# Patient Record
Sex: Male | Born: 1940
Health system: Southern US, Community
[De-identification: ages and names within clinical notes are randomized; demographics above are authoritative.]

## PROBLEM LIST (undated history)

## (undated) DIAGNOSIS — R55 Syncope and collapse: Secondary | ICD-10-CM

## (undated) DIAGNOSIS — I4891 Unspecified atrial fibrillation: Secondary | ICD-10-CM

## (undated) DIAGNOSIS — S22000A Wedge compression fracture of unspecified thoracic vertebra, initial encounter for closed fracture: Secondary | ICD-10-CM

## (undated) DIAGNOSIS — I2510 Atherosclerotic heart disease of native coronary artery without angina pectoris: Secondary | ICD-10-CM

## (undated) DIAGNOSIS — F1010 Alcohol abuse, uncomplicated: Secondary | ICD-10-CM

## (undated) DIAGNOSIS — M12812 Other specific arthropathies, not elsewhere classified, left shoulder: Secondary | ICD-10-CM

## (undated) DIAGNOSIS — D649 Anemia, unspecified: Secondary | ICD-10-CM

## (undated) DIAGNOSIS — E785 Hyperlipidemia, unspecified: Secondary | ICD-10-CM

## (undated) DIAGNOSIS — J189 Pneumonia, unspecified organism: Secondary | ICD-10-CM

## (undated) DIAGNOSIS — Z8719 Personal history of other diseases of the digestive system: Secondary | ICD-10-CM

## (undated) DIAGNOSIS — C44621 Squamous cell carcinoma of skin of unspecified upper limb, including shoulder: Secondary | ICD-10-CM

## (undated) DIAGNOSIS — N200 Calculus of kidney: Secondary | ICD-10-CM

## (undated) DIAGNOSIS — I10 Essential (primary) hypertension: Secondary | ICD-10-CM

## (undated) DIAGNOSIS — K449 Diaphragmatic hernia without obstruction or gangrene: Secondary | ICD-10-CM

## (undated) DIAGNOSIS — R7989 Other specified abnormal findings of blood chemistry: Secondary | ICD-10-CM

## (undated) DIAGNOSIS — D509 Iron deficiency anemia, unspecified: Secondary | ICD-10-CM

## (undated) DIAGNOSIS — K5790 Diverticulosis of intestine, part unspecified, without perforation or abscess without bleeding: Secondary | ICD-10-CM

## (undated) DIAGNOSIS — I700 Atherosclerosis of aorta: Secondary | ICD-10-CM

## (undated) DIAGNOSIS — K922 Gastrointestinal hemorrhage, unspecified: Secondary | ICD-10-CM

## (undated) DIAGNOSIS — I517 Cardiomegaly: Secondary | ICD-10-CM

## (undated) DIAGNOSIS — R011 Cardiac murmur, unspecified: Secondary | ICD-10-CM

## (undated) DIAGNOSIS — Z87442 Personal history of urinary calculi: Secondary | ICD-10-CM

## (undated) DIAGNOSIS — M5030 Other cervical disc degeneration, unspecified cervical region: Secondary | ICD-10-CM

## (undated) DIAGNOSIS — N401 Enlarged prostate with lower urinary tract symptoms: Secondary | ICD-10-CM

## (undated) DIAGNOSIS — N138 Other obstructive and reflux uropathy: Secondary | ICD-10-CM

## (undated) DIAGNOSIS — K529 Noninfective gastroenteritis and colitis, unspecified: Secondary | ICD-10-CM

## (undated) HISTORY — DX: Personal history of urinary calculi: Z87.442

## (undated) HISTORY — PX: WISDOM TOOTH EXTRACTION: SHX21

## (undated) HISTORY — PX: FOOT SURGERY: SHX648

## (undated) HISTORY — PX: TONSILLECTOMY: SUR1361

## (undated) HISTORY — PX: CHOLECYSTECTOMY: SHX55

## (undated) HISTORY — PX: HEMORROIDECTOMY: SUR656

## (undated) HISTORY — PX: ENDOSCOPIC RETROGRADE CHOLANGIOPANCREATOGRAPHY (ERCP) WITH PROPOFOL: SHX5810

## (undated) HISTORY — PX: HERNIA REPAIR: SHX51

## (undated) HISTORY — DX: Unspecified atrial fibrillation: I48.91

## (undated) HISTORY — DX: Personal history of other diseases of the digestive system: Z87.19

## (undated) HISTORY — PX: CARDIAC CATHETERIZATION: SHX172

## (undated) HISTORY — PX: ENDOSCOPIC VEIN LASER TREATMENT: SHX1508

## (undated) HISTORY — PX: RHINOPLASTY: SUR1284

## (undated) HISTORY — PX: OTHER SURGICAL HISTORY: SHX169

## (undated) MED FILL — Ferumoxytol Inj 510 MG/17ML (30 MG/ML) (Elemental Fe): INTRAVENOUS | Qty: 17 | Status: AC

---

## 1966-03-13 HISTORY — PX: OTHER SURGICAL HISTORY: SHX169

## 2004-02-25 ENCOUNTER — Ambulatory Visit: Payer: Self-pay | Admitting: Internal Medicine

## 2004-03-30 ENCOUNTER — Ambulatory Visit: Payer: Self-pay | Admitting: Surgery

## 2006-03-13 HISTORY — PX: CATARACT EXTRACTION: SUR2

## 2006-05-08 ENCOUNTER — Ambulatory Visit: Payer: Self-pay | Admitting: Unknown Physician Specialty

## 2006-10-31 ENCOUNTER — Ambulatory Visit: Payer: Self-pay | Admitting: Ophthalmology

## 2006-11-07 ENCOUNTER — Ambulatory Visit: Payer: Self-pay | Admitting: Ophthalmology

## 2007-03-14 HISTORY — PX: FOOT SURGERY: SHX648

## 2007-04-15 ENCOUNTER — Other Ambulatory Visit: Payer: Self-pay

## 2007-04-15 ENCOUNTER — Emergency Department: Payer: Self-pay | Admitting: Internal Medicine

## 2007-04-29 ENCOUNTER — Encounter: Payer: Self-pay | Admitting: Internal Medicine

## 2007-05-12 ENCOUNTER — Encounter: Payer: Self-pay | Admitting: Internal Medicine

## 2007-06-12 ENCOUNTER — Encounter: Payer: Self-pay | Admitting: Internal Medicine

## 2007-11-04 ENCOUNTER — Ambulatory Visit: Payer: Self-pay | Admitting: Podiatry

## 2007-11-08 ENCOUNTER — Ambulatory Visit: Payer: Self-pay | Admitting: Podiatry

## 2009-03-13 HISTORY — PX: NOSE SURGERY: SHX723

## 2010-02-24 ENCOUNTER — Ambulatory Visit: Payer: Self-pay | Admitting: Otolaryngology

## 2011-03-14 HISTORY — PX: OTHER SURGICAL HISTORY: SHX169

## 2011-03-21 ENCOUNTER — Ambulatory Visit: Payer: Self-pay | Admitting: Internal Medicine

## 2011-04-03 ENCOUNTER — Ambulatory Visit: Payer: Self-pay | Admitting: Ophthalmology

## 2011-04-04 ENCOUNTER — Ambulatory Visit: Payer: Self-pay | Admitting: Physical Medicine and Rehabilitation

## 2011-04-11 ENCOUNTER — Ambulatory Visit: Payer: Self-pay | Admitting: Ophthalmology

## 2012-03-13 HISTORY — PX: OTHER SURGICAL HISTORY: SHX169

## 2013-01-02 ENCOUNTER — Ambulatory Visit: Payer: Self-pay | Admitting: Gastroenterology

## 2013-01-13 ENCOUNTER — Ambulatory Visit: Payer: Self-pay | Admitting: Gastroenterology

## 2013-01-23 DIAGNOSIS — Z9889 Other specified postprocedural states: Secondary | ICD-10-CM | POA: Insufficient documentation

## 2013-01-23 DIAGNOSIS — K8050 Calculus of bile duct without cholangitis or cholecystitis without obstruction: Secondary | ICD-10-CM | POA: Insufficient documentation

## 2013-01-24 DIAGNOSIS — K851 Biliary acute pancreatitis without necrosis or infection: Secondary | ICD-10-CM | POA: Insufficient documentation

## 2013-10-06 DIAGNOSIS — D649 Anemia, unspecified: Secondary | ICD-10-CM | POA: Insufficient documentation

## 2013-10-06 DIAGNOSIS — D509 Iron deficiency anemia, unspecified: Secondary | ICD-10-CM | POA: Insufficient documentation

## 2014-07-05 NOTE — Op Note (Signed)
PATIENT NAME:  Marcus Coleman, Marcus Coleman MR#:  [id number] DATE OF BIRTH:  1941-03-11  DATE OF PROCEDURE:  04/11/2011  PREOPERATIVE DIAGNOSES:  Senile cataract right eye, astigmatism.  POSTOPERATIVE DIAGNOSES:  Senile cataract right eye, astigmatism.  PROCEDURES:  1. Phacoemulsification with posterior chamber intraocular lens implantation of the right eye. 2. Limbal relaxing incision, right eye.  LENS:  SN6AD1 21.0-diopter posterior chamber with Restore intraocular lens with 3 diopters of add power.  ULTRASOUND TIME:  9% of 1 minute, 19 seconds for CDE 7.3.  SURGEON:  Mali Chaselynn Kepple, MD  ANESTHESIA:  Topical with tetracaine drops and 2% Xylocaine jelly.  COMPLICATIONS:  None.  DESCRIPTION OF PROCEDURE:  The patient was identified in the holding room and transported to the operating room and placed in the supine position under the operating microscope.  The right eye was identified as the operative eye and it was prepped and draped in the usual sterile ophthalmic fashion. The axis of the eye had been marked preoperatively at 0, 180, and 270 degrees. A guarded diamond blade set to 0.6 mm was used to make a 60 degree arcuate incision on the nasal aspect of the peripheral cornea from the 2 o'clock  to the 4 o'clock  position.  A 1 millimeter clear-corneal paracentesis was made at the 12 o'clock  position.  The anterior chamber was filled with Viscoat viscoelastic.  A 2.4 millimeter keratome was used to make a near-clear corneal incision at the 9 o'clock position.  A curvilinear capsulorrhexis was made with a cystotome and capsulorrhexis forceps.  Balanced salt solution was used to hydrodissect and hydrodelineate the nucleus.  Phacoemulsification was then used in horizontal chopping fashion to remove the lens nucleus and epinucleus.  The remaining cortex was then removed using the irrigation and aspiration handpiece. Provisc was then placed into the capsular bag to distend it for lens placement.  A  SN6AD1  21.0-diopter lens was then injected into the capsular bag.  The remaining viscoelastic was aspirated.  Wounds were hydrated with balanced salt solution.  The anterior chamber was inflated to a physiologic pressure with balanced salt solution.  Miostat was placed into the anterior chamber to constrict the pupil. No wound leaks were noted.  Topical Vigamox drops and Maxitrol ointment were applied to the eye. The patient was taken to the recovery room in stable condition without complications of anesthesia or surgery. ____________________________ Wyonia Hough, MD crb:slb D: 04/11/2011 10:20:26 ET T: 04/11/2011 11:20:20 ET JOB#: [id number]  cc: Wyonia Hough, MD, <Dictator> Leandrew Koyanagi MD ELECTRONICALLY SIGNED 04/12/2011 11:13

## 2014-08-06 ENCOUNTER — Other Ambulatory Visit: Payer: Self-pay | Admitting: Family Medicine

## 2014-08-06 ENCOUNTER — Ambulatory Visit
Admission: RE | Admit: 2014-08-06 | Discharge: 2014-08-06 | Disposition: A | Discharge status: Home / Self Care | Payer: PPO | Source: Ambulatory Visit | Attending: Family Medicine | Admitting: Family Medicine

## 2014-08-06 DIAGNOSIS — M79604 Pain in right leg: Secondary | ICD-10-CM | POA: Diagnosis present

## 2014-11-13 DIAGNOSIS — M5135 Other intervertebral disc degeneration, thoracolumbar region: Secondary | ICD-10-CM | POA: Insufficient documentation

## 2014-11-13 DIAGNOSIS — M5416 Radiculopathy, lumbar region: Secondary | ICD-10-CM | POA: Insufficient documentation

## 2015-03-30 DIAGNOSIS — M5432 Sciatica, left side: Secondary | ICD-10-CM | POA: Diagnosis not present

## 2015-03-30 DIAGNOSIS — M955 Acquired deformity of pelvis: Secondary | ICD-10-CM | POA: Diagnosis not present

## 2015-03-30 DIAGNOSIS — M9903 Segmental and somatic dysfunction of lumbar region: Secondary | ICD-10-CM | POA: Diagnosis not present

## 2015-03-30 DIAGNOSIS — M9905 Segmental and somatic dysfunction of pelvic region: Secondary | ICD-10-CM | POA: Diagnosis not present

## 2015-04-09 DIAGNOSIS — L03119 Cellulitis of unspecified part of limb: Secondary | ICD-10-CM | POA: Diagnosis not present

## 2015-04-09 DIAGNOSIS — M7989 Other specified soft tissue disorders: Secondary | ICD-10-CM | POA: Diagnosis not present

## 2015-04-09 DIAGNOSIS — M79609 Pain in unspecified limb: Secondary | ICD-10-CM | POA: Diagnosis not present

## 2015-04-09 DIAGNOSIS — I890 Lymphedema, not elsewhere classified: Secondary | ICD-10-CM | POA: Diagnosis not present

## 2015-04-09 DIAGNOSIS — I872 Venous insufficiency (chronic) (peripheral): Secondary | ICD-10-CM | POA: Diagnosis not present

## 2015-04-09 DIAGNOSIS — I8000 Phlebitis and thrombophlebitis of superficial vessels of unspecified lower extremity: Secondary | ICD-10-CM | POA: Diagnosis not present

## 2015-04-09 DIAGNOSIS — I83813 Varicose veins of bilateral lower extremities with pain: Secondary | ICD-10-CM | POA: Diagnosis not present

## 2015-04-27 DIAGNOSIS — M9903 Segmental and somatic dysfunction of lumbar region: Secondary | ICD-10-CM | POA: Diagnosis not present

## 2015-04-27 DIAGNOSIS — M9905 Segmental and somatic dysfunction of pelvic region: Secondary | ICD-10-CM | POA: Diagnosis not present

## 2015-04-27 DIAGNOSIS — M5432 Sciatica, left side: Secondary | ICD-10-CM | POA: Diagnosis not present

## 2015-04-27 DIAGNOSIS — M955 Acquired deformity of pelvis: Secondary | ICD-10-CM | POA: Diagnosis not present

## 2015-04-30 DIAGNOSIS — Z125 Encounter for screening for malignant neoplasm of prostate: Secondary | ICD-10-CM | POA: Diagnosis not present

## 2015-04-30 DIAGNOSIS — E7800 Pure hypercholesterolemia, unspecified: Secondary | ICD-10-CM | POA: Diagnosis not present

## 2015-04-30 DIAGNOSIS — Z79899 Other long term (current) drug therapy: Secondary | ICD-10-CM | POA: Diagnosis not present

## 2015-05-04 DIAGNOSIS — Z961 Presence of intraocular lens: Secondary | ICD-10-CM | POA: Diagnosis not present

## 2015-05-07 DIAGNOSIS — R030 Elevated blood-pressure reading, without diagnosis of hypertension: Secondary | ICD-10-CM | POA: Diagnosis not present

## 2015-05-07 DIAGNOSIS — E7800 Pure hypercholesterolemia, unspecified: Secondary | ICD-10-CM | POA: Diagnosis not present

## 2015-05-07 DIAGNOSIS — Z79899 Other long term (current) drug therapy: Secondary | ICD-10-CM | POA: Diagnosis not present

## 2015-05-07 DIAGNOSIS — D509 Iron deficiency anemia, unspecified: Secondary | ICD-10-CM | POA: Diagnosis not present

## 2015-05-07 DIAGNOSIS — Z0000 Encounter for general adult medical examination without abnormal findings: Secondary | ICD-10-CM | POA: Diagnosis not present

## 2015-05-07 DIAGNOSIS — M5136 Other intervertebral disc degeneration, lumbar region: Secondary | ICD-10-CM | POA: Diagnosis not present

## 2015-05-18 DIAGNOSIS — I890 Lymphedema, not elsewhere classified: Secondary | ICD-10-CM | POA: Diagnosis not present

## 2015-05-18 DIAGNOSIS — M7989 Other specified soft tissue disorders: Secondary | ICD-10-CM | POA: Diagnosis not present

## 2015-05-18 DIAGNOSIS — L03119 Cellulitis of unspecified part of limb: Secondary | ICD-10-CM | POA: Diagnosis not present

## 2015-05-18 DIAGNOSIS — I872 Venous insufficiency (chronic) (peripheral): Secondary | ICD-10-CM | POA: Diagnosis not present

## 2015-05-18 DIAGNOSIS — M79609 Pain in unspecified limb: Secondary | ICD-10-CM | POA: Diagnosis not present

## 2015-05-18 DIAGNOSIS — I83813 Varicose veins of bilateral lower extremities with pain: Secondary | ICD-10-CM | POA: Diagnosis not present

## 2015-05-18 DIAGNOSIS — I8000 Phlebitis and thrombophlebitis of superficial vessels of unspecified lower extremity: Secondary | ICD-10-CM | POA: Diagnosis not present

## 2015-05-21 DIAGNOSIS — L03119 Cellulitis of unspecified part of limb: Secondary | ICD-10-CM | POA: Diagnosis not present

## 2015-05-21 DIAGNOSIS — I872 Venous insufficiency (chronic) (peripheral): Secondary | ICD-10-CM | POA: Diagnosis not present

## 2015-05-21 DIAGNOSIS — I8000 Phlebitis and thrombophlebitis of superficial vessels of unspecified lower extremity: Secondary | ICD-10-CM | POA: Diagnosis not present

## 2015-05-21 DIAGNOSIS — M79609 Pain in unspecified limb: Secondary | ICD-10-CM | POA: Diagnosis not present

## 2015-05-21 DIAGNOSIS — I8311 Varicose veins of right lower extremity with inflammation: Secondary | ICD-10-CM | POA: Diagnosis not present

## 2015-05-21 DIAGNOSIS — I83813 Varicose veins of bilateral lower extremities with pain: Secondary | ICD-10-CM | POA: Diagnosis not present

## 2015-05-21 DIAGNOSIS — M7989 Other specified soft tissue disorders: Secondary | ICD-10-CM | POA: Diagnosis not present

## 2015-05-21 DIAGNOSIS — I890 Lymphedema, not elsewhere classified: Secondary | ICD-10-CM | POA: Diagnosis not present

## 2015-05-25 DIAGNOSIS — M9905 Segmental and somatic dysfunction of pelvic region: Secondary | ICD-10-CM | POA: Diagnosis not present

## 2015-05-25 DIAGNOSIS — M955 Acquired deformity of pelvis: Secondary | ICD-10-CM | POA: Diagnosis not present

## 2015-05-25 DIAGNOSIS — M9903 Segmental and somatic dysfunction of lumbar region: Secondary | ICD-10-CM | POA: Diagnosis not present

## 2015-05-25 DIAGNOSIS — M5432 Sciatica, left side: Secondary | ICD-10-CM | POA: Diagnosis not present

## 2015-06-11 DIAGNOSIS — M79609 Pain in unspecified limb: Secondary | ICD-10-CM | POA: Diagnosis not present

## 2015-06-11 DIAGNOSIS — I8311 Varicose veins of right lower extremity with inflammation: Secondary | ICD-10-CM | POA: Diagnosis not present

## 2015-06-11 DIAGNOSIS — I83813 Varicose veins of bilateral lower extremities with pain: Secondary | ICD-10-CM | POA: Diagnosis not present

## 2015-06-11 DIAGNOSIS — L03119 Cellulitis of unspecified part of limb: Secondary | ICD-10-CM | POA: Diagnosis not present

## 2015-06-11 DIAGNOSIS — I8000 Phlebitis and thrombophlebitis of superficial vessels of unspecified lower extremity: Secondary | ICD-10-CM | POA: Diagnosis not present

## 2015-06-11 DIAGNOSIS — I890 Lymphedema, not elsewhere classified: Secondary | ICD-10-CM | POA: Diagnosis not present

## 2015-06-11 DIAGNOSIS — M7989 Other specified soft tissue disorders: Secondary | ICD-10-CM | POA: Diagnosis not present

## 2015-06-11 DIAGNOSIS — I872 Venous insufficiency (chronic) (peripheral): Secondary | ICD-10-CM | POA: Diagnosis not present

## 2015-07-09 DIAGNOSIS — H900 Conductive hearing loss, bilateral: Secondary | ICD-10-CM | POA: Diagnosis not present

## 2015-07-23 DIAGNOSIS — J069 Acute upper respiratory infection, unspecified: Secondary | ICD-10-CM | POA: Diagnosis not present

## 2015-07-26 DIAGNOSIS — M5432 Sciatica, left side: Secondary | ICD-10-CM | POA: Diagnosis not present

## 2015-07-26 DIAGNOSIS — M9903 Segmental and somatic dysfunction of lumbar region: Secondary | ICD-10-CM | POA: Diagnosis not present

## 2015-07-26 DIAGNOSIS — M9905 Segmental and somatic dysfunction of pelvic region: Secondary | ICD-10-CM | POA: Diagnosis not present

## 2015-07-26 DIAGNOSIS — M955 Acquired deformity of pelvis: Secondary | ICD-10-CM | POA: Diagnosis not present

## 2015-08-04 DIAGNOSIS — D2262 Melanocytic nevi of left upper limb, including shoulder: Secondary | ICD-10-CM | POA: Diagnosis not present

## 2015-08-04 DIAGNOSIS — X32XXXA Exposure to sunlight, initial encounter: Secondary | ICD-10-CM | POA: Diagnosis not present

## 2015-08-04 DIAGNOSIS — L0889 Other specified local infections of the skin and subcutaneous tissue: Secondary | ICD-10-CM | POA: Diagnosis not present

## 2015-08-04 DIAGNOSIS — B078 Other viral warts: Secondary | ICD-10-CM | POA: Diagnosis not present

## 2015-08-04 DIAGNOSIS — L570 Actinic keratosis: Secondary | ICD-10-CM | POA: Diagnosis not present

## 2015-08-04 DIAGNOSIS — Z85828 Personal history of other malignant neoplasm of skin: Secondary | ICD-10-CM | POA: Diagnosis not present

## 2015-08-04 DIAGNOSIS — D225 Melanocytic nevi of trunk: Secondary | ICD-10-CM | POA: Diagnosis not present

## 2015-08-04 DIAGNOSIS — D2272 Melanocytic nevi of left lower limb, including hip: Secondary | ICD-10-CM | POA: Diagnosis not present

## 2015-08-23 DIAGNOSIS — M9903 Segmental and somatic dysfunction of lumbar region: Secondary | ICD-10-CM | POA: Diagnosis not present

## 2015-08-23 DIAGNOSIS — M5432 Sciatica, left side: Secondary | ICD-10-CM | POA: Diagnosis not present

## 2015-08-23 DIAGNOSIS — M955 Acquired deformity of pelvis: Secondary | ICD-10-CM | POA: Diagnosis not present

## 2015-08-23 DIAGNOSIS — M9905 Segmental and somatic dysfunction of pelvic region: Secondary | ICD-10-CM | POA: Diagnosis not present

## 2015-09-20 DIAGNOSIS — M5432 Sciatica, left side: Secondary | ICD-10-CM | POA: Diagnosis not present

## 2015-09-20 DIAGNOSIS — M955 Acquired deformity of pelvis: Secondary | ICD-10-CM | POA: Diagnosis not present

## 2015-09-20 DIAGNOSIS — M9903 Segmental and somatic dysfunction of lumbar region: Secondary | ICD-10-CM | POA: Diagnosis not present

## 2015-09-20 DIAGNOSIS — M9905 Segmental and somatic dysfunction of pelvic region: Secondary | ICD-10-CM | POA: Diagnosis not present

## 2015-09-24 DIAGNOSIS — I83813 Varicose veins of bilateral lower extremities with pain: Secondary | ICD-10-CM | POA: Diagnosis not present

## 2015-09-24 DIAGNOSIS — I872 Venous insufficiency (chronic) (peripheral): Secondary | ICD-10-CM | POA: Diagnosis not present

## 2015-09-24 DIAGNOSIS — I890 Lymphedema, not elsewhere classified: Secondary | ICD-10-CM | POA: Diagnosis not present

## 2015-09-24 DIAGNOSIS — M79609 Pain in unspecified limb: Secondary | ICD-10-CM | POA: Diagnosis not present

## 2015-09-24 DIAGNOSIS — I8000 Phlebitis and thrombophlebitis of superficial vessels of unspecified lower extremity: Secondary | ICD-10-CM | POA: Diagnosis not present

## 2015-09-24 DIAGNOSIS — M7989 Other specified soft tissue disorders: Secondary | ICD-10-CM | POA: Diagnosis not present

## 2015-09-24 DIAGNOSIS — I8311 Varicose veins of right lower extremity with inflammation: Secondary | ICD-10-CM | POA: Diagnosis not present

## 2015-09-24 DIAGNOSIS — L03119 Cellulitis of unspecified part of limb: Secondary | ICD-10-CM | POA: Diagnosis not present

## 2015-09-28 DIAGNOSIS — I872 Venous insufficiency (chronic) (peripheral): Secondary | ICD-10-CM | POA: Diagnosis not present

## 2015-09-28 DIAGNOSIS — M79609 Pain in unspecified limb: Secondary | ICD-10-CM | POA: Diagnosis not present

## 2015-09-28 DIAGNOSIS — I83813 Varicose veins of bilateral lower extremities with pain: Secondary | ICD-10-CM | POA: Diagnosis not present

## 2015-09-28 DIAGNOSIS — L03119 Cellulitis of unspecified part of limb: Secondary | ICD-10-CM | POA: Diagnosis not present

## 2015-09-28 DIAGNOSIS — M7989 Other specified soft tissue disorders: Secondary | ICD-10-CM | POA: Diagnosis not present

## 2015-09-28 DIAGNOSIS — I8310 Varicose veins of unspecified lower extremity with inflammation: Secondary | ICD-10-CM | POA: Diagnosis not present

## 2015-09-28 DIAGNOSIS — I8000 Phlebitis and thrombophlebitis of superficial vessels of unspecified lower extremity: Secondary | ICD-10-CM | POA: Diagnosis not present

## 2015-09-28 DIAGNOSIS — I8311 Varicose veins of right lower extremity with inflammation: Secondary | ICD-10-CM | POA: Diagnosis not present

## 2015-09-28 DIAGNOSIS — I890 Lymphedema, not elsewhere classified: Secondary | ICD-10-CM | POA: Diagnosis not present

## 2015-10-18 DIAGNOSIS — M9903 Segmental and somatic dysfunction of lumbar region: Secondary | ICD-10-CM | POA: Diagnosis not present

## 2015-10-18 DIAGNOSIS — M5432 Sciatica, left side: Secondary | ICD-10-CM | POA: Diagnosis not present

## 2015-10-18 DIAGNOSIS — M9905 Segmental and somatic dysfunction of pelvic region: Secondary | ICD-10-CM | POA: Diagnosis not present

## 2015-10-18 DIAGNOSIS — M955 Acquired deformity of pelvis: Secondary | ICD-10-CM | POA: Diagnosis not present

## 2015-10-22 DIAGNOSIS — I8310 Varicose veins of unspecified lower extremity with inflammation: Secondary | ICD-10-CM | POA: Diagnosis not present

## 2015-10-22 DIAGNOSIS — I890 Lymphedema, not elsewhere classified: Secondary | ICD-10-CM | POA: Diagnosis not present

## 2015-10-22 DIAGNOSIS — M79609 Pain in unspecified limb: Secondary | ICD-10-CM | POA: Diagnosis not present

## 2015-10-22 DIAGNOSIS — M7989 Other specified soft tissue disorders: Secondary | ICD-10-CM | POA: Diagnosis not present

## 2015-10-22 DIAGNOSIS — I8000 Phlebitis and thrombophlebitis of superficial vessels of unspecified lower extremity: Secondary | ICD-10-CM | POA: Diagnosis not present

## 2015-10-22 DIAGNOSIS — I872 Venous insufficiency (chronic) (peripheral): Secondary | ICD-10-CM | POA: Diagnosis not present

## 2015-10-22 DIAGNOSIS — Z79899 Other long term (current) drug therapy: Secondary | ICD-10-CM | POA: Diagnosis not present

## 2015-10-22 DIAGNOSIS — D509 Iron deficiency anemia, unspecified: Secondary | ICD-10-CM | POA: Diagnosis not present

## 2015-10-22 DIAGNOSIS — I8311 Varicose veins of right lower extremity with inflammation: Secondary | ICD-10-CM | POA: Diagnosis not present

## 2015-10-22 DIAGNOSIS — L03119 Cellulitis of unspecified part of limb: Secondary | ICD-10-CM | POA: Diagnosis not present

## 2015-10-22 DIAGNOSIS — I83813 Varicose veins of bilateral lower extremities with pain: Secondary | ICD-10-CM | POA: Diagnosis not present

## 2015-11-01 DIAGNOSIS — M9905 Segmental and somatic dysfunction of pelvic region: Secondary | ICD-10-CM | POA: Diagnosis not present

## 2015-11-01 DIAGNOSIS — M5432 Sciatica, left side: Secondary | ICD-10-CM | POA: Diagnosis not present

## 2015-11-01 DIAGNOSIS — M9903 Segmental and somatic dysfunction of lumbar region: Secondary | ICD-10-CM | POA: Diagnosis not present

## 2015-11-01 DIAGNOSIS — M955 Acquired deformity of pelvis: Secondary | ICD-10-CM | POA: Diagnosis not present

## 2015-11-05 DIAGNOSIS — E7800 Pure hypercholesterolemia, unspecified: Secondary | ICD-10-CM | POA: Diagnosis not present

## 2015-11-05 DIAGNOSIS — Z79899 Other long term (current) drug therapy: Secondary | ICD-10-CM | POA: Diagnosis not present

## 2015-11-05 DIAGNOSIS — D509 Iron deficiency anemia, unspecified: Secondary | ICD-10-CM | POA: Diagnosis not present

## 2015-11-05 DIAGNOSIS — M5416 Radiculopathy, lumbar region: Secondary | ICD-10-CM | POA: Diagnosis not present

## 2015-11-05 DIAGNOSIS — Z125 Encounter for screening for malignant neoplasm of prostate: Secondary | ICD-10-CM | POA: Diagnosis not present

## 2015-11-05 DIAGNOSIS — M25511 Pain in right shoulder: Secondary | ICD-10-CM | POA: Diagnosis not present

## 2015-11-05 DIAGNOSIS — Z13220 Encounter for screening for lipoid disorders: Secondary | ICD-10-CM | POA: Diagnosis not present

## 2015-11-05 DIAGNOSIS — M19011 Primary osteoarthritis, right shoulder: Secondary | ICD-10-CM | POA: Diagnosis not present

## 2015-11-16 DIAGNOSIS — M9903 Segmental and somatic dysfunction of lumbar region: Secondary | ICD-10-CM | POA: Diagnosis not present

## 2015-11-16 DIAGNOSIS — M955 Acquired deformity of pelvis: Secondary | ICD-10-CM | POA: Diagnosis not present

## 2015-11-16 DIAGNOSIS — M9905 Segmental and somatic dysfunction of pelvic region: Secondary | ICD-10-CM | POA: Diagnosis not present

## 2015-11-16 DIAGNOSIS — M5432 Sciatica, left side: Secondary | ICD-10-CM | POA: Diagnosis not present

## 2015-12-07 DIAGNOSIS — M9905 Segmental and somatic dysfunction of pelvic region: Secondary | ICD-10-CM | POA: Diagnosis not present

## 2015-12-07 DIAGNOSIS — M9903 Segmental and somatic dysfunction of lumbar region: Secondary | ICD-10-CM | POA: Diagnosis not present

## 2015-12-07 DIAGNOSIS — M5432 Sciatica, left side: Secondary | ICD-10-CM | POA: Diagnosis not present

## 2015-12-07 DIAGNOSIS — M955 Acquired deformity of pelvis: Secondary | ICD-10-CM | POA: Diagnosis not present

## 2015-12-28 DIAGNOSIS — M5432 Sciatica, left side: Secondary | ICD-10-CM | POA: Diagnosis not present

## 2015-12-28 DIAGNOSIS — M9905 Segmental and somatic dysfunction of pelvic region: Secondary | ICD-10-CM | POA: Diagnosis not present

## 2015-12-28 DIAGNOSIS — M9903 Segmental and somatic dysfunction of lumbar region: Secondary | ICD-10-CM | POA: Diagnosis not present

## 2015-12-28 DIAGNOSIS — M955 Acquired deformity of pelvis: Secondary | ICD-10-CM | POA: Diagnosis not present

## 2016-01-06 DIAGNOSIS — M545 Low back pain: Secondary | ICD-10-CM | POA: Diagnosis not present

## 2016-01-06 DIAGNOSIS — G8929 Other chronic pain: Secondary | ICD-10-CM | POA: Diagnosis not present

## 2016-01-25 DIAGNOSIS — M5432 Sciatica, left side: Secondary | ICD-10-CM | POA: Diagnosis not present

## 2016-01-25 DIAGNOSIS — M955 Acquired deformity of pelvis: Secondary | ICD-10-CM | POA: Diagnosis not present

## 2016-01-25 DIAGNOSIS — M9903 Segmental and somatic dysfunction of lumbar region: Secondary | ICD-10-CM | POA: Diagnosis not present

## 2016-01-25 DIAGNOSIS — M9905 Segmental and somatic dysfunction of pelvic region: Secondary | ICD-10-CM | POA: Diagnosis not present

## 2016-02-15 DIAGNOSIS — M5136 Other intervertebral disc degeneration, lumbar region: Secondary | ICD-10-CM | POA: Diagnosis not present

## 2016-02-15 DIAGNOSIS — M5416 Radiculopathy, lumbar region: Secondary | ICD-10-CM | POA: Diagnosis not present

## 2016-02-15 DIAGNOSIS — M5135 Other intervertebral disc degeneration, thoracolumbar region: Secondary | ICD-10-CM | POA: Diagnosis not present

## 2016-02-16 ENCOUNTER — Other Ambulatory Visit: Payer: Self-pay | Admitting: Physical Medicine and Rehabilitation

## 2016-02-16 DIAGNOSIS — M5416 Radiculopathy, lumbar region: Secondary | ICD-10-CM

## 2016-02-28 ENCOUNTER — Ambulatory Visit
Admission: RE | Admit: 2016-02-28 | Discharge: 2016-02-28 | Disposition: A | Discharge status: Home / Self Care | Payer: PPO | Source: Ambulatory Visit | Attending: Physical Medicine and Rehabilitation | Admitting: Physical Medicine and Rehabilitation

## 2016-02-28 DIAGNOSIS — M5136 Other intervertebral disc degeneration, lumbar region: Secondary | ICD-10-CM | POA: Insufficient documentation

## 2016-02-28 DIAGNOSIS — Z981 Arthrodesis status: Secondary | ICD-10-CM | POA: Insufficient documentation

## 2016-02-28 DIAGNOSIS — M5116 Intervertebral disc disorders with radiculopathy, lumbar region: Secondary | ICD-10-CM | POA: Diagnosis not present

## 2016-02-28 DIAGNOSIS — M545 Low back pain: Secondary | ICD-10-CM | POA: Diagnosis not present

## 2016-02-28 DIAGNOSIS — M5416 Radiculopathy, lumbar region: Secondary | ICD-10-CM | POA: Diagnosis not present

## 2016-02-29 DIAGNOSIS — M9905 Segmental and somatic dysfunction of pelvic region: Secondary | ICD-10-CM | POA: Diagnosis not present

## 2016-02-29 DIAGNOSIS — M9903 Segmental and somatic dysfunction of lumbar region: Secondary | ICD-10-CM | POA: Diagnosis not present

## 2016-02-29 DIAGNOSIS — M5432 Sciatica, left side: Secondary | ICD-10-CM | POA: Diagnosis not present

## 2016-02-29 DIAGNOSIS — M955 Acquired deformity of pelvis: Secondary | ICD-10-CM | POA: Diagnosis not present

## 2016-03-16 DIAGNOSIS — M5416 Radiculopathy, lumbar region: Secondary | ICD-10-CM | POA: Diagnosis not present

## 2016-03-16 DIAGNOSIS — M5136 Other intervertebral disc degeneration, lumbar region: Secondary | ICD-10-CM | POA: Diagnosis not present

## 2016-03-28 DIAGNOSIS — M955 Acquired deformity of pelvis: Secondary | ICD-10-CM | POA: Diagnosis not present

## 2016-03-28 DIAGNOSIS — M5432 Sciatica, left side: Secondary | ICD-10-CM | POA: Diagnosis not present

## 2016-03-28 DIAGNOSIS — M9905 Segmental and somatic dysfunction of pelvic region: Secondary | ICD-10-CM | POA: Diagnosis not present

## 2016-03-28 DIAGNOSIS — M9903 Segmental and somatic dysfunction of lumbar region: Secondary | ICD-10-CM | POA: Diagnosis not present

## 2016-04-06 DIAGNOSIS — M5136 Other intervertebral disc degeneration, lumbar region: Secondary | ICD-10-CM | POA: Diagnosis not present

## 2016-04-06 DIAGNOSIS — M5416 Radiculopathy, lumbar region: Secondary | ICD-10-CM | POA: Diagnosis not present

## 2016-04-07 DIAGNOSIS — Z1211 Encounter for screening for malignant neoplasm of colon: Secondary | ICD-10-CM | POA: Diagnosis not present

## 2016-04-07 DIAGNOSIS — D509 Iron deficiency anemia, unspecified: Secondary | ICD-10-CM | POA: Diagnosis not present

## 2016-04-21 DIAGNOSIS — M9905 Segmental and somatic dysfunction of pelvic region: Secondary | ICD-10-CM | POA: Diagnosis not present

## 2016-04-21 DIAGNOSIS — M9903 Segmental and somatic dysfunction of lumbar region: Secondary | ICD-10-CM | POA: Diagnosis not present

## 2016-04-21 DIAGNOSIS — M955 Acquired deformity of pelvis: Secondary | ICD-10-CM | POA: Diagnosis not present

## 2016-04-21 DIAGNOSIS — M5432 Sciatica, left side: Secondary | ICD-10-CM | POA: Diagnosis not present

## 2016-05-02 DIAGNOSIS — Z13220 Encounter for screening for lipoid disorders: Secondary | ICD-10-CM | POA: Diagnosis not present

## 2016-05-02 DIAGNOSIS — D509 Iron deficiency anemia, unspecified: Secondary | ICD-10-CM | POA: Diagnosis not present

## 2016-05-02 DIAGNOSIS — Z125 Encounter for screening for malignant neoplasm of prostate: Secondary | ICD-10-CM | POA: Diagnosis not present

## 2016-05-02 DIAGNOSIS — Z79899 Other long term (current) drug therapy: Secondary | ICD-10-CM | POA: Diagnosis not present

## 2016-05-09 DIAGNOSIS — Z0000 Encounter for general adult medical examination without abnormal findings: Secondary | ICD-10-CM | POA: Diagnosis not present

## 2016-05-09 DIAGNOSIS — E7800 Pure hypercholesterolemia, unspecified: Secondary | ICD-10-CM | POA: Diagnosis not present

## 2016-05-09 DIAGNOSIS — D509 Iron deficiency anemia, unspecified: Secondary | ICD-10-CM | POA: Diagnosis not present

## 2016-05-09 DIAGNOSIS — R05 Cough: Secondary | ICD-10-CM | POA: Diagnosis not present

## 2016-05-09 DIAGNOSIS — M5416 Radiculopathy, lumbar region: Secondary | ICD-10-CM | POA: Diagnosis not present

## 2016-05-09 DIAGNOSIS — Z79899 Other long term (current) drug therapy: Secondary | ICD-10-CM | POA: Diagnosis not present

## 2016-06-02 DIAGNOSIS — M5136 Other intervertebral disc degeneration, lumbar region: Secondary | ICD-10-CM | POA: Diagnosis not present

## 2016-06-02 DIAGNOSIS — M5135 Other intervertebral disc degeneration, thoracolumbar region: Secondary | ICD-10-CM | POA: Diagnosis not present

## 2016-06-02 DIAGNOSIS — D509 Iron deficiency anemia, unspecified: Secondary | ICD-10-CM | POA: Diagnosis not present

## 2016-06-02 DIAGNOSIS — M5416 Radiculopathy, lumbar region: Secondary | ICD-10-CM | POA: Diagnosis not present

## 2016-06-06 DIAGNOSIS — D509 Iron deficiency anemia, unspecified: Secondary | ICD-10-CM | POA: Diagnosis not present

## 2016-06-09 DIAGNOSIS — J3089 Other allergic rhinitis: Secondary | ICD-10-CM | POA: Diagnosis not present

## 2016-06-09 DIAGNOSIS — J209 Acute bronchitis, unspecified: Secondary | ICD-10-CM | POA: Diagnosis not present

## 2016-06-23 ENCOUNTER — Encounter: Payer: Self-pay | Admitting: *Deleted

## 2016-06-26 ENCOUNTER — Ambulatory Visit
Admission: RE | Admit: 2016-06-26 | Discharge: 2016-06-26 | Disposition: A | Discharge status: Home / Self Care | Payer: PPO | Source: Ambulatory Visit | Attending: Unknown Physician Specialty | Admitting: Unknown Physician Specialty

## 2016-06-26 ENCOUNTER — Ambulatory Visit: Payer: PPO | Admitting: Anesthesiology

## 2016-06-26 ENCOUNTER — Encounter: Payer: Self-pay | Admitting: *Deleted

## 2016-06-26 ENCOUNTER — Encounter
Admission: RE | Disposition: A | Discharge status: Home / Self Care | Payer: Self-pay | Source: Ambulatory Visit | Attending: Unknown Physician Specialty

## 2016-06-26 DIAGNOSIS — K640 First degree hemorrhoids: Secondary | ICD-10-CM | POA: Insufficient documentation

## 2016-06-26 DIAGNOSIS — D127 Benign neoplasm of rectosigmoid junction: Secondary | ICD-10-CM | POA: Diagnosis not present

## 2016-06-26 DIAGNOSIS — K635 Polyp of colon: Secondary | ICD-10-CM | POA: Diagnosis not present

## 2016-06-26 DIAGNOSIS — K621 Rectal polyp: Secondary | ICD-10-CM | POA: Insufficient documentation

## 2016-06-26 DIAGNOSIS — Z87891 Personal history of nicotine dependence: Secondary | ICD-10-CM | POA: Insufficient documentation

## 2016-06-26 DIAGNOSIS — D125 Benign neoplasm of sigmoid colon: Secondary | ICD-10-CM | POA: Insufficient documentation

## 2016-06-26 DIAGNOSIS — D509 Iron deficiency anemia, unspecified: Secondary | ICD-10-CM | POA: Insufficient documentation

## 2016-06-26 DIAGNOSIS — I10 Essential (primary) hypertension: Secondary | ICD-10-CM | POA: Diagnosis not present

## 2016-06-26 DIAGNOSIS — Z79899 Other long term (current) drug therapy: Secondary | ICD-10-CM | POA: Diagnosis not present

## 2016-06-26 DIAGNOSIS — D122 Benign neoplasm of ascending colon: Secondary | ICD-10-CM | POA: Diagnosis not present

## 2016-06-26 DIAGNOSIS — Z1211 Encounter for screening for malignant neoplasm of colon: Secondary | ICD-10-CM | POA: Diagnosis not present

## 2016-06-26 DIAGNOSIS — K648 Other hemorrhoids: Secondary | ICD-10-CM | POA: Diagnosis not present

## 2016-06-26 DIAGNOSIS — K5790 Diverticulosis of intestine, part unspecified, without perforation or abscess without bleeding: Secondary | ICD-10-CM | POA: Diagnosis not present

## 2016-06-26 HISTORY — DX: Other specified abnormal findings of blood chemistry: R79.89

## 2016-06-26 HISTORY — DX: Hyperlipidemia, unspecified: E78.5

## 2016-06-26 HISTORY — DX: Other cervical disc degeneration, unspecified cervical region: M50.30

## 2016-06-26 HISTORY — DX: Syncope and collapse: R55

## 2016-06-26 HISTORY — DX: Iron deficiency anemia, unspecified: D50.9

## 2016-06-26 HISTORY — DX: Essential (primary) hypertension: I10

## 2016-06-26 HISTORY — DX: Anemia, unspecified: D64.9

## 2016-06-26 HISTORY — PX: COLONOSCOPY WITH PROPOFOL: SHX5780

## 2016-06-26 HISTORY — DX: Noninfective gastroenteritis and colitis, unspecified: K52.9

## 2016-06-26 SURGERY — COLONOSCOPY WITH PROPOFOL
Anesthesia: General

## 2016-06-26 MED ORDER — SODIUM CHLORIDE 0.9 % IV SOLN: INTRAVENOUS | Status: DC

## 2016-06-26 MED ORDER — PROPOFOL 10 MG/ML IV BOLUS
INTRAVENOUS | Status: DC | PRN
  Administered 2016-06-26: 40 mg via INTRAVENOUS

## 2016-06-26 MED ORDER — MIDAZOLAM HCL 2 MG/2ML IJ SOLN
INTRAMUSCULAR | Status: DC | PRN
  Administered 2016-06-26: 2 mg via INTRAVENOUS

## 2016-06-26 MED ORDER — PROPOFOL 500 MG/50ML IV EMUL
INTRAVENOUS | Status: AC
  Filled 2016-06-26: qty 50

## 2016-06-26 MED ORDER — LIDOCAINE HCL (CARDIAC) 20 MG/ML IV SOLN
INTRAVENOUS | Status: DC | PRN
  Administered 2016-06-26: 50 mg via INTRAVENOUS

## 2016-06-26 MED ORDER — SODIUM CHLORIDE 0.9 % IV SOLN
INTRAVENOUS | Status: DC
  Administered 2016-06-26 (×2): 1000 mL via INTRAVENOUS

## 2016-06-26 MED ORDER — MIDAZOLAM HCL 2 MG/2ML IJ SOLN
INTRAMUSCULAR | Status: AC
  Filled 2016-06-26: qty 2

## 2016-06-26 MED ORDER — PROPOFOL 500 MG/50ML IV EMUL
INTRAVENOUS | Status: DC | PRN
  Administered 2016-06-26: 140 ug/kg/min via INTRAVENOUS

## 2016-06-26 MED ORDER — ONDANSETRON HCL 4 MG/2ML IJ SOLN: 4.0000 mg | Freq: Once | INTRAMUSCULAR | Status: DC | PRN

## 2016-06-26 MED ORDER — FENTANYL CITRATE (PF) 100 MCG/2ML IJ SOLN: 25.0000 ug | INTRAMUSCULAR | Status: DC | PRN

## 2016-06-26 NOTE — Transfer of Care (Signed)
Immediate Anesthesia Transfer of Care Note  Patient: MICAHEL OMLOR  Procedure(s) Performed: Procedure(s): COLONOSCOPY WITH PROPOFOL (N/A)  Patient Location: PACU  Anesthesia Type:General  Level of Consciousness: awake  Airway & Oxygen Therapy: Patient Spontanous Breathing and Patient connected to nasal cannula oxygen  Post-op Assessment: Report given to RN and Post -op Vital signs reviewed and stable  Post vital signs: Reviewed and stable  Last Vitals:  Vitals:   06/26/16 1310 06/26/16 1500  BP: (!) 175/96 119/76  Pulse: 72 79  Resp: 18 18  Temp: 36.2 C 36.3 C    Last Pain:  Vitals:   06/26/16 1500  TempSrc: Tympanic      Patients Stated Pain Goal: 0 (16/60/63 0160)  Complications: No apparent anesthesia complications

## 2016-06-26 NOTE — Anesthesia Preprocedure Evaluation (Addendum)
Anesthesia Evaluation  Patient identified by MRN, date of birth, ID band Patient awake    Reviewed: Allergy & Precautions, NPO status , Patient's Chart, lab work & pertinent test results  Airway Mallampati: III  TM Distance: <3 FB     Dental  (+) Caps, Chipped   Pulmonary former smoker,    Pulmonary exam normal        Cardiovascular hypertension, Pt. on medications Normal cardiovascular exam     Neuro/Psych negative neurological ROS  negative psych ROS   GI/Hepatic negative GI ROS, Neg liver ROS,   Endo/Other  negative endocrine ROS  Renal/GU negative Renal ROS  negative genitourinary   Musculoskeletal  (+) Arthritis , Osteoarthritis,    Abdominal Normal abdominal exam  (+)   Peds negative pediatric ROS (+)  Hematology  (+) anemia ,   Anesthesia Other Findings   Reproductive/Obstetrics                            Anesthesia Physical Anesthesia Plan  ASA: II  Anesthesia Plan: General   Post-op Pain Management:    Induction: Intravenous  Airway Management Planned: Nasal Cannula  Additional Equipment:   Intra-op Plan:   Post-operative Plan:   Informed Consent: I have reviewed the patients History and Physical, chart, labs and discussed the procedure including the risks, benefits and alternatives for the proposed anesthesia with the patient or authorized representative who has indicated his/her understanding and acceptance.   Dental advisory given  Plan Discussed with: CRNA and Surgeon  Anesthesia Plan Comments:         Anesthesia Quick Evaluation

## 2016-06-26 NOTE — Anesthesia Post-op Follow-up Note (Cosign Needed)
Anesthesia QCDR form completed.        

## 2016-06-26 NOTE — H&P (Signed)
Primary Care Physician:  Idelle Crouch, MD Primary Gastroenterologist:  Dr. Vira Agar  Pre-Procedure History & Physical: HPI:  Marcus Coleman is a 76 y.o. male is here for an colonoscopy.   Past Medical History:  Diagnosis Date  . Anemia   . Chronic diarrhea   . DDD (degenerative disc disease), cervical   . Hyperlipidemia   . Hypertension    borderline  . IDA (iron deficiency anemia)   . Low testosterone   . Vasovagal syncope     Past Surgical History:  Procedure Laterality Date  . CARDIAC CATHETERIZATION    . catracts Bilateral   . CHOLECYSTECTOMY    . ENDOSCOPIC RETROGRADE CHOLANGIOPANCREATOGRAPHY (ERCP) WITH PROPOFOL    . eyelid surgery    . FOOT SURGERY    . HEMORROIDECTOMY    . HERNIA REPAIR    . mrercp    . RHINOPLASTY    . TONSILLECTOMY    . WISDOM TOOTH EXTRACTION      Prior to Admission medications   Medication Sig Start Date End Date Taking? Authorizing Provider  acetaminophen (TYLENOL) 325 MG tablet Take 650 mg by mouth every 6 (six) hours as needed.   Yes Historical Provider, MD  cetirizine (ZYRTEC) 10 MG tablet Take 10 mg by mouth daily.   Yes Historical Provider, MD  ferrous sulfate 325 (65 FE) MG tablet Take 325 mg by mouth daily with breakfast.   Yes Historical Provider, MD  Multiple Vitamin (MULTIVITAMIN) tablet Take 1 tablet by mouth daily.   Yes Historical Provider, MD  vitamin B-12 (CYANOCOBALAMIN) 500 MCG tablet Take 500 mcg by mouth daily.   Yes Historical Provider, MD  vitamin C (ASCORBIC ACID) 500 MG tablet Take 500 mg by mouth daily.   Yes Historical Provider, MD    Allergies as of 05/04/2016  . (Not on File)    History reviewed. No pertinent family history.  Social History   Social History  . Marital status: Married    Spouse name: N/A  . Number of children: N/A  . Years of education: N/A   Occupational History  . Not on file.   Social History Main Topics  . Smoking status: Former Research scientist (life sciences)  . Smokeless tobacco: Never Used   . Alcohol use 8.4 oz/week    14 Cans of beer per week  . Drug use: No  . Sexual activity: Not on file   Other Topics Concern  . Not on file   Social History Narrative  . No narrative on file    Review of Systems: See HPI, otherwise negative ROS  Physical Exam: BP (!) 175/96   Pulse 72   Temp 97.2 F (36.2 C) (Tympanic)   Resp 18   Ht 5\' 6"  (1.676 m)   Wt 95.3 kg (210 lb)   SpO2 97%   BMI 33.89 kg/m  General:   Alert,  pleasant and cooperative in NAD Head:  Normocephalic and atraumatic. Neck:  Supple; no masses or thyromegaly. Lungs:  Clear throughout to auscultation.    Heart:  Regular rate and rhythm. Abdomen:  Soft, nontender and nondistended. Normal bowel sounds, without guarding, and without rebound.   Neurologic:  Alert and  oriented x4;  grossly normal neurologically.  Impression/Plan: Marcus Coleman is here for an colonoscopy to be performed for screening  Risks, benefits, limitations, and alternatives regarding  colonoscopy have been reviewed with the patient.  Questions have been answered.  All parties agreeable.   Gaylyn Cheers, MD  06/26/2016, 2:26 PM

## 2016-06-26 NOTE — Op Note (Addendum)
[facility]astroenter[facility] Patient Name: Marcus Coleman Procedure Date: 06/26/2016 2:21 PM MRN: [id number] Account #: [id number] Date of Birth: 1941/02/27 Admit Type: Outpatient Age: 76 Room: [facility] ENDO ROOM 1 Gender: Male Note Status: Finalized Procedure:            Colonoscopy Indications:          Screening for colorectal malignant neoplasm Providers:            Manya Silvas, MD Referring MD:         Leonie Douglas. Doy Hutching, MD (Referring MD) Medicines:            Propofol per Anesthesia Complications:        No immediate complications. Procedure:            Pre-Anesthesia Assessment:                       - After reviewing the risks and benefits, the patient                        was deemed in satisfactory condition to undergo the                        procedure.                       After obtaining informed consent, the colonoscope was                        passed under direct vision. Throughout the procedure,                        the patient's blood pressure, pulse, and oxygen                        saturations were monitored continuously. The                        Colonoscope was introduced through the anus and                        advanced to the the cecum, identified by appendiceal                        orifice and ileocecal valve. The colonoscopy was                        performed without difficulty. The patient tolerated the                        procedure well. The quality of the bowel preparation                        was excellent. Findings:      A small polyp was found in the sigmoid colon. The polyp was sessile. The       polyp was removed with a cold snare. Resection and retrieval were       complete.      A 10 mm polyp was found in the ascending colon. The polyp was sessile.       The polyp was removed with a hot snare. Resection and retrieval were  complete.      Three sessile polyps were found in the rectum. The polyps were        diminutive in size. These polyps were removed with a jumbo cold forceps.       Resection and retrieval were complete.      Internal hemorrhoids were found during endoscopy. The hemorrhoids were       small and Grade I (internal hemorrhoids that do not prolapse). Impression:           - One small polyp in the sigmoid colon, removed with a                        cold snare. Resected and retrieved.                       - One 10 mm polyp in the ascending colon, removed with                        a hot snare. Resected and retrieved.                       - Three diminutive polyps in the rectum, removed with a                        jumbo cold forceps. Resected and retrieved.                       - Internal hemorrhoids. Recommendation:       - Await pathology results. Manya Silvas, MD 06/26/2016 2:59:29 PM This report has been signed electronically. Number of Addenda: 0 Note Initiated On: 06/26/2016 2:21 PM Scope Withdrawal Time: 0 hours 14 minutes 41 seconds  Total Procedure Duration: 0 hours 24 minutes 12 seconds       [facility]

## 2016-06-26 NOTE — Anesthesia Procedure Notes (Signed)
Date/Time: 06/26/2016 2:22 PM Performed by: Johnna Acosta Pre-anesthesia Checklist: Patient identified, Emergency Drugs available, Suction available, Patient being monitored and Timeout performed Patient Re-evaluated:Patient Re-evaluated prior to inductionOxygen Delivery Method: Nasal cannula

## 2016-06-27 ENCOUNTER — Encounter: Payer: Self-pay | Admitting: Unknown Physician Specialty

## 2016-06-27 NOTE — Anesthesia Postprocedure Evaluation (Signed)
Anesthesia Post Note  Patient: Marcus Coleman  Procedure(s) Performed: Procedure(s) (LRB): COLONOSCOPY WITH PROPOFOL (N/A)  Patient location during evaluation: PACU Anesthesia Type: General Level of consciousness: awake and alert and oriented Pain management: pain level controlled Vital Signs Assessment: post-procedure vital signs reviewed and stable Respiratory status: spontaneous breathing Cardiovascular status: blood pressure returned to baseline Anesthetic complications: no     Last Vitals:  Vitals:   06/26/16 1520 06/26/16 1530  BP: (!) 149/77 (!) 144/65  Pulse: 66 (!) 59  Resp: 15 16  Temp:      Last Pain:  Vitals:   06/27/16 0740  TempSrc:   PainSc: 0-No pain                 Qunisha Bryk

## 2016-06-28 LAB — SURGICAL PATHOLOGY

## 2016-07-07 DIAGNOSIS — M9903 Segmental and somatic dysfunction of lumbar region: Secondary | ICD-10-CM | POA: Diagnosis not present

## 2016-07-07 DIAGNOSIS — M9905 Segmental and somatic dysfunction of pelvic region: Secondary | ICD-10-CM | POA: Diagnosis not present

## 2016-07-07 DIAGNOSIS — M955 Acquired deformity of pelvis: Secondary | ICD-10-CM | POA: Diagnosis not present

## 2016-07-07 DIAGNOSIS — M5432 Sciatica, left side: Secondary | ICD-10-CM | POA: Diagnosis not present

## 2016-07-17 DIAGNOSIS — M955 Acquired deformity of pelvis: Secondary | ICD-10-CM | POA: Diagnosis not present

## 2016-07-17 DIAGNOSIS — M9905 Segmental and somatic dysfunction of pelvic region: Secondary | ICD-10-CM | POA: Diagnosis not present

## 2016-07-17 DIAGNOSIS — M5432 Sciatica, left side: Secondary | ICD-10-CM | POA: Diagnosis not present

## 2016-07-17 DIAGNOSIS — M9903 Segmental and somatic dysfunction of lumbar region: Secondary | ICD-10-CM | POA: Diagnosis not present

## 2016-07-24 DIAGNOSIS — M955 Acquired deformity of pelvis: Secondary | ICD-10-CM | POA: Diagnosis not present

## 2016-07-24 DIAGNOSIS — M5432 Sciatica, left side: Secondary | ICD-10-CM | POA: Diagnosis not present

## 2016-07-24 DIAGNOSIS — M9905 Segmental and somatic dysfunction of pelvic region: Secondary | ICD-10-CM | POA: Diagnosis not present

## 2016-07-24 DIAGNOSIS — M9903 Segmental and somatic dysfunction of lumbar region: Secondary | ICD-10-CM | POA: Diagnosis not present

## 2016-08-21 DIAGNOSIS — M955 Acquired deformity of pelvis: Secondary | ICD-10-CM | POA: Diagnosis not present

## 2016-08-21 DIAGNOSIS — M9905 Segmental and somatic dysfunction of pelvic region: Secondary | ICD-10-CM | POA: Diagnosis not present

## 2016-08-21 DIAGNOSIS — M9903 Segmental and somatic dysfunction of lumbar region: Secondary | ICD-10-CM | POA: Diagnosis not present

## 2016-08-21 DIAGNOSIS — M5432 Sciatica, left side: Secondary | ICD-10-CM | POA: Diagnosis not present

## 2016-08-29 DIAGNOSIS — M955 Acquired deformity of pelvis: Secondary | ICD-10-CM | POA: Diagnosis not present

## 2016-08-29 DIAGNOSIS — M5432 Sciatica, left side: Secondary | ICD-10-CM | POA: Diagnosis not present

## 2016-08-29 DIAGNOSIS — M9905 Segmental and somatic dysfunction of pelvic region: Secondary | ICD-10-CM | POA: Diagnosis not present

## 2016-08-29 DIAGNOSIS — M9903 Segmental and somatic dysfunction of lumbar region: Secondary | ICD-10-CM | POA: Diagnosis not present

## 2016-09-25 DIAGNOSIS — M9903 Segmental and somatic dysfunction of lumbar region: Secondary | ICD-10-CM | POA: Diagnosis not present

## 2016-09-25 DIAGNOSIS — M955 Acquired deformity of pelvis: Secondary | ICD-10-CM | POA: Diagnosis not present

## 2016-09-25 DIAGNOSIS — M5432 Sciatica, left side: Secondary | ICD-10-CM | POA: Diagnosis not present

## 2016-09-25 DIAGNOSIS — M9905 Segmental and somatic dysfunction of pelvic region: Secondary | ICD-10-CM | POA: Diagnosis not present

## 2016-10-20 DIAGNOSIS — M9903 Segmental and somatic dysfunction of lumbar region: Secondary | ICD-10-CM | POA: Diagnosis not present

## 2016-10-20 DIAGNOSIS — M955 Acquired deformity of pelvis: Secondary | ICD-10-CM | POA: Diagnosis not present

## 2016-10-20 DIAGNOSIS — M9905 Segmental and somatic dysfunction of pelvic region: Secondary | ICD-10-CM | POA: Diagnosis not present

## 2016-10-20 DIAGNOSIS — M5432 Sciatica, left side: Secondary | ICD-10-CM | POA: Diagnosis not present

## 2016-10-25 DIAGNOSIS — M955 Acquired deformity of pelvis: Secondary | ICD-10-CM | POA: Diagnosis not present

## 2016-10-25 DIAGNOSIS — M9903 Segmental and somatic dysfunction of lumbar region: Secondary | ICD-10-CM | POA: Diagnosis not present

## 2016-10-25 DIAGNOSIS — M5432 Sciatica, left side: Secondary | ICD-10-CM | POA: Diagnosis not present

## 2016-10-25 DIAGNOSIS — M9905 Segmental and somatic dysfunction of pelvic region: Secondary | ICD-10-CM | POA: Diagnosis not present

## 2016-10-30 DIAGNOSIS — M9903 Segmental and somatic dysfunction of lumbar region: Secondary | ICD-10-CM | POA: Diagnosis not present

## 2016-10-30 DIAGNOSIS — M955 Acquired deformity of pelvis: Secondary | ICD-10-CM | POA: Diagnosis not present

## 2016-10-30 DIAGNOSIS — M9905 Segmental and somatic dysfunction of pelvic region: Secondary | ICD-10-CM | POA: Diagnosis not present

## 2016-10-30 DIAGNOSIS — M5432 Sciatica, left side: Secondary | ICD-10-CM | POA: Diagnosis not present

## 2016-11-01 DIAGNOSIS — E7800 Pure hypercholesterolemia, unspecified: Secondary | ICD-10-CM | POA: Diagnosis not present

## 2016-11-01 DIAGNOSIS — D509 Iron deficiency anemia, unspecified: Secondary | ICD-10-CM | POA: Diagnosis not present

## 2016-11-01 DIAGNOSIS — Z79899 Other long term (current) drug therapy: Secondary | ICD-10-CM | POA: Diagnosis not present

## 2016-11-08 DIAGNOSIS — M5416 Radiculopathy, lumbar region: Secondary | ICD-10-CM | POA: Diagnosis not present

## 2016-11-08 DIAGNOSIS — R030 Elevated blood-pressure reading, without diagnosis of hypertension: Secondary | ICD-10-CM | POA: Diagnosis not present

## 2016-11-08 DIAGNOSIS — E7800 Pure hypercholesterolemia, unspecified: Secondary | ICD-10-CM | POA: Diagnosis not present

## 2016-11-08 DIAGNOSIS — Z0000 Encounter for general adult medical examination without abnormal findings: Secondary | ICD-10-CM | POA: Diagnosis not present

## 2016-11-08 DIAGNOSIS — D509 Iron deficiency anemia, unspecified: Secondary | ICD-10-CM | POA: Diagnosis not present

## 2017-01-03 DIAGNOSIS — M9905 Segmental and somatic dysfunction of pelvic region: Secondary | ICD-10-CM | POA: Diagnosis not present

## 2017-01-03 DIAGNOSIS — M955 Acquired deformity of pelvis: Secondary | ICD-10-CM | POA: Diagnosis not present

## 2017-01-03 DIAGNOSIS — M9903 Segmental and somatic dysfunction of lumbar region: Secondary | ICD-10-CM | POA: Diagnosis not present

## 2017-01-03 DIAGNOSIS — M5432 Sciatica, left side: Secondary | ICD-10-CM | POA: Diagnosis not present

## 2017-01-08 DIAGNOSIS — M955 Acquired deformity of pelvis: Secondary | ICD-10-CM | POA: Diagnosis not present

## 2017-01-08 DIAGNOSIS — M5432 Sciatica, left side: Secondary | ICD-10-CM | POA: Diagnosis not present

## 2017-01-08 DIAGNOSIS — M9905 Segmental and somatic dysfunction of pelvic region: Secondary | ICD-10-CM | POA: Diagnosis not present

## 2017-01-08 DIAGNOSIS — M9903 Segmental and somatic dysfunction of lumbar region: Secondary | ICD-10-CM | POA: Diagnosis not present

## 2017-01-15 DIAGNOSIS — M955 Acquired deformity of pelvis: Secondary | ICD-10-CM | POA: Diagnosis not present

## 2017-01-15 DIAGNOSIS — M9905 Segmental and somatic dysfunction of pelvic region: Secondary | ICD-10-CM | POA: Diagnosis not present

## 2017-01-15 DIAGNOSIS — M5432 Sciatica, left side: Secondary | ICD-10-CM | POA: Diagnosis not present

## 2017-01-15 DIAGNOSIS — M9903 Segmental and somatic dysfunction of lumbar region: Secondary | ICD-10-CM | POA: Diagnosis not present

## 2017-02-08 DIAGNOSIS — D509 Iron deficiency anemia, unspecified: Secondary | ICD-10-CM | POA: Diagnosis not present

## 2017-02-12 DIAGNOSIS — M9905 Segmental and somatic dysfunction of pelvic region: Secondary | ICD-10-CM | POA: Diagnosis not present

## 2017-02-12 DIAGNOSIS — M9903 Segmental and somatic dysfunction of lumbar region: Secondary | ICD-10-CM | POA: Diagnosis not present

## 2017-02-12 DIAGNOSIS — M955 Acquired deformity of pelvis: Secondary | ICD-10-CM | POA: Diagnosis not present

## 2017-02-12 DIAGNOSIS — M5432 Sciatica, left side: Secondary | ICD-10-CM | POA: Diagnosis not present

## 2017-03-08 DIAGNOSIS — M955 Acquired deformity of pelvis: Secondary | ICD-10-CM | POA: Diagnosis not present

## 2017-03-08 DIAGNOSIS — M9905 Segmental and somatic dysfunction of pelvic region: Secondary | ICD-10-CM | POA: Diagnosis not present

## 2017-03-08 DIAGNOSIS — M9903 Segmental and somatic dysfunction of lumbar region: Secondary | ICD-10-CM | POA: Diagnosis not present

## 2017-03-08 DIAGNOSIS — M5432 Sciatica, left side: Secondary | ICD-10-CM | POA: Diagnosis not present

## 2017-03-19 DIAGNOSIS — M9903 Segmental and somatic dysfunction of lumbar region: Secondary | ICD-10-CM | POA: Diagnosis not present

## 2017-03-19 DIAGNOSIS — M955 Acquired deformity of pelvis: Secondary | ICD-10-CM | POA: Diagnosis not present

## 2017-03-19 DIAGNOSIS — M5432 Sciatica, left side: Secondary | ICD-10-CM | POA: Diagnosis not present

## 2017-03-19 DIAGNOSIS — M9905 Segmental and somatic dysfunction of pelvic region: Secondary | ICD-10-CM | POA: Diagnosis not present

## 2017-03-23 DIAGNOSIS — D509 Iron deficiency anemia, unspecified: Secondary | ICD-10-CM | POA: Diagnosis not present

## 2017-04-02 DIAGNOSIS — M955 Acquired deformity of pelvis: Secondary | ICD-10-CM | POA: Diagnosis not present

## 2017-04-02 DIAGNOSIS — M9905 Segmental and somatic dysfunction of pelvic region: Secondary | ICD-10-CM | POA: Diagnosis not present

## 2017-04-02 DIAGNOSIS — M9903 Segmental and somatic dysfunction of lumbar region: Secondary | ICD-10-CM | POA: Diagnosis not present

## 2017-04-02 DIAGNOSIS — M5432 Sciatica, left side: Secondary | ICD-10-CM | POA: Diagnosis not present

## 2017-04-06 DIAGNOSIS — H40003 Preglaucoma, unspecified, bilateral: Secondary | ICD-10-CM | POA: Diagnosis not present

## 2017-04-24 DIAGNOSIS — M9905 Segmental and somatic dysfunction of pelvic region: Secondary | ICD-10-CM | POA: Diagnosis not present

## 2017-04-24 DIAGNOSIS — M9903 Segmental and somatic dysfunction of lumbar region: Secondary | ICD-10-CM | POA: Diagnosis not present

## 2017-04-24 DIAGNOSIS — M5432 Sciatica, left side: Secondary | ICD-10-CM | POA: Diagnosis not present

## 2017-04-24 DIAGNOSIS — M955 Acquired deformity of pelvis: Secondary | ICD-10-CM | POA: Diagnosis not present

## 2017-05-01 DIAGNOSIS — M5432 Sciatica, left side: Secondary | ICD-10-CM | POA: Diagnosis not present

## 2017-05-01 DIAGNOSIS — M9905 Segmental and somatic dysfunction of pelvic region: Secondary | ICD-10-CM | POA: Diagnosis not present

## 2017-05-01 DIAGNOSIS — M9903 Segmental and somatic dysfunction of lumbar region: Secondary | ICD-10-CM | POA: Diagnosis not present

## 2017-05-01 DIAGNOSIS — M955 Acquired deformity of pelvis: Secondary | ICD-10-CM | POA: Diagnosis not present

## 2017-05-11 DIAGNOSIS — R030 Elevated blood-pressure reading, without diagnosis of hypertension: Secondary | ICD-10-CM | POA: Diagnosis not present

## 2017-05-11 DIAGNOSIS — E7800 Pure hypercholesterolemia, unspecified: Secondary | ICD-10-CM | POA: Diagnosis not present

## 2017-05-11 DIAGNOSIS — R7989 Other specified abnormal findings of blood chemistry: Secondary | ICD-10-CM | POA: Diagnosis not present

## 2017-05-11 DIAGNOSIS — M5416 Radiculopathy, lumbar region: Secondary | ICD-10-CM | POA: Diagnosis not present

## 2017-05-11 DIAGNOSIS — Z125 Encounter for screening for malignant neoplasm of prostate: Secondary | ICD-10-CM | POA: Diagnosis not present

## 2017-05-11 DIAGNOSIS — Z79899 Other long term (current) drug therapy: Secondary | ICD-10-CM | POA: Diagnosis not present

## 2017-05-11 DIAGNOSIS — Z0000 Encounter for general adult medical examination without abnormal findings: Secondary | ICD-10-CM | POA: Diagnosis not present

## 2017-05-11 DIAGNOSIS — D509 Iron deficiency anemia, unspecified: Secondary | ICD-10-CM | POA: Diagnosis not present

## 2017-05-16 DIAGNOSIS — M9903 Segmental and somatic dysfunction of lumbar region: Secondary | ICD-10-CM | POA: Diagnosis not present

## 2017-05-16 DIAGNOSIS — M955 Acquired deformity of pelvis: Secondary | ICD-10-CM | POA: Diagnosis not present

## 2017-05-16 DIAGNOSIS — M5432 Sciatica, left side: Secondary | ICD-10-CM | POA: Diagnosis not present

## 2017-05-16 DIAGNOSIS — M9905 Segmental and somatic dysfunction of pelvic region: Secondary | ICD-10-CM | POA: Diagnosis not present

## 2017-05-28 DIAGNOSIS — M5432 Sciatica, left side: Secondary | ICD-10-CM | POA: Diagnosis not present

## 2017-05-28 DIAGNOSIS — M955 Acquired deformity of pelvis: Secondary | ICD-10-CM | POA: Diagnosis not present

## 2017-05-28 DIAGNOSIS — M9903 Segmental and somatic dysfunction of lumbar region: Secondary | ICD-10-CM | POA: Diagnosis not present

## 2017-05-28 DIAGNOSIS — M9905 Segmental and somatic dysfunction of pelvic region: Secondary | ICD-10-CM | POA: Diagnosis not present

## 2017-06-15 DIAGNOSIS — R05 Cough: Secondary | ICD-10-CM | POA: Diagnosis not present

## 2017-06-15 DIAGNOSIS — J209 Acute bronchitis, unspecified: Secondary | ICD-10-CM | POA: Diagnosis not present

## 2017-06-25 DIAGNOSIS — M955 Acquired deformity of pelvis: Secondary | ICD-10-CM | POA: Diagnosis not present

## 2017-06-25 DIAGNOSIS — M5432 Sciatica, left side: Secondary | ICD-10-CM | POA: Diagnosis not present

## 2017-06-25 DIAGNOSIS — M9905 Segmental and somatic dysfunction of pelvic region: Secondary | ICD-10-CM | POA: Diagnosis not present

## 2017-06-25 DIAGNOSIS — M9903 Segmental and somatic dysfunction of lumbar region: Secondary | ICD-10-CM | POA: Diagnosis not present

## 2017-07-23 DIAGNOSIS — M5432 Sciatica, left side: Secondary | ICD-10-CM | POA: Diagnosis not present

## 2017-07-23 DIAGNOSIS — M9903 Segmental and somatic dysfunction of lumbar region: Secondary | ICD-10-CM | POA: Diagnosis not present

## 2017-07-23 DIAGNOSIS — M9905 Segmental and somatic dysfunction of pelvic region: Secondary | ICD-10-CM | POA: Diagnosis not present

## 2017-07-23 DIAGNOSIS — M955 Acquired deformity of pelvis: Secondary | ICD-10-CM | POA: Diagnosis not present

## 2017-07-24 DIAGNOSIS — D509 Iron deficiency anemia, unspecified: Secondary | ICD-10-CM | POA: Diagnosis not present

## 2017-07-24 DIAGNOSIS — E7800 Pure hypercholesterolemia, unspecified: Secondary | ICD-10-CM | POA: Diagnosis not present

## 2017-07-24 DIAGNOSIS — N401 Enlarged prostate with lower urinary tract symptoms: Secondary | ICD-10-CM | POA: Diagnosis not present

## 2017-07-24 DIAGNOSIS — R351 Nocturia: Secondary | ICD-10-CM | POA: Diagnosis not present

## 2017-08-20 DIAGNOSIS — M9905 Segmental and somatic dysfunction of pelvic region: Secondary | ICD-10-CM | POA: Diagnosis not present

## 2017-08-20 DIAGNOSIS — M9903 Segmental and somatic dysfunction of lumbar region: Secondary | ICD-10-CM | POA: Diagnosis not present

## 2017-08-20 DIAGNOSIS — M5432 Sciatica, left side: Secondary | ICD-10-CM | POA: Diagnosis not present

## 2017-08-20 DIAGNOSIS — M955 Acquired deformity of pelvis: Secondary | ICD-10-CM | POA: Diagnosis not present

## 2017-08-22 DIAGNOSIS — M5432 Sciatica, left side: Secondary | ICD-10-CM | POA: Diagnosis not present

## 2017-08-22 DIAGNOSIS — M955 Acquired deformity of pelvis: Secondary | ICD-10-CM | POA: Diagnosis not present

## 2017-08-22 DIAGNOSIS — M9903 Segmental and somatic dysfunction of lumbar region: Secondary | ICD-10-CM | POA: Diagnosis not present

## 2017-08-22 DIAGNOSIS — M9905 Segmental and somatic dysfunction of pelvic region: Secondary | ICD-10-CM | POA: Diagnosis not present

## 2017-08-22 DIAGNOSIS — J40 Bronchitis, not specified as acute or chronic: Secondary | ICD-10-CM | POA: Diagnosis not present

## 2017-08-28 ENCOUNTER — Ambulatory Visit: Payer: Self-pay | Admitting: Urology

## 2017-09-17 DIAGNOSIS — M5432 Sciatica, left side: Secondary | ICD-10-CM | POA: Diagnosis not present

## 2017-09-17 DIAGNOSIS — M9903 Segmental and somatic dysfunction of lumbar region: Secondary | ICD-10-CM | POA: Diagnosis not present

## 2017-09-17 DIAGNOSIS — M9905 Segmental and somatic dysfunction of pelvic region: Secondary | ICD-10-CM | POA: Diagnosis not present

## 2017-09-17 DIAGNOSIS — M955 Acquired deformity of pelvis: Secondary | ICD-10-CM | POA: Diagnosis not present

## 2017-09-28 ENCOUNTER — Encounter: Payer: Self-pay | Admitting: Urology

## 2017-09-28 ENCOUNTER — Ambulatory Visit (INDEPENDENT_AMBULATORY_CARE_PROVIDER_SITE_OTHER): Payer: PPO | Admitting: Urology

## 2017-09-28 ENCOUNTER — Other Ambulatory Visit: Payer: Self-pay

## 2017-09-28 VITALS — BP 137/74 | HR 76 | Wt 222.0 lb

## 2017-09-28 DIAGNOSIS — R7989 Other specified abnormal findings of blood chemistry: Secondary | ICD-10-CM | POA: Insufficient documentation

## 2017-09-28 DIAGNOSIS — K449 Diaphragmatic hernia without obstruction or gangrene: Secondary | ICD-10-CM | POA: Insufficient documentation

## 2017-09-28 DIAGNOSIS — N400 Enlarged prostate without lower urinary tract symptoms: Secondary | ICD-10-CM | POA: Diagnosis not present

## 2017-09-28 DIAGNOSIS — R55 Syncope and collapse: Secondary | ICD-10-CM | POA: Insufficient documentation

## 2017-09-28 DIAGNOSIS — E785 Hyperlipidemia, unspecified: Secondary | ICD-10-CM | POA: Insufficient documentation

## 2017-09-28 DIAGNOSIS — R030 Elevated blood-pressure reading, without diagnosis of hypertension: Secondary | ICD-10-CM | POA: Insufficient documentation

## 2017-09-28 LAB — URINALYSIS, COMPLETE
Bilirubin, UA: NEGATIVE
GLUCOSE, UA: NEGATIVE
KETONES UA: NEGATIVE
LEUKOCYTES UA: NEGATIVE
Nitrite, UA: NEGATIVE
PROTEIN UA: NEGATIVE
RBC, UA: NEGATIVE
Urobilinogen, Ur: 0.2 mg/dL (ref 0.2–1.0)
pH, UA: 5 (ref 5.0–7.5)

## 2017-09-28 LAB — BLADDER SCAN AMB NON-IMAGING

## 2017-09-28 LAB — MICROSCOPIC EXAMINATION
Epithelial Cells (non renal): NONE SEEN /hpf (ref 0–10)
RBC, UA: NONE SEEN /hpf (ref 0–2)
WBC, UA: NONE SEEN /hpf (ref 0–5)

## 2017-09-28 MED ORDER — TAMSULOSIN HCL 0.4 MG PO CAPS: 0.4000 mg | ORAL_CAPSULE | Freq: Every day | ORAL | 3 refills | Status: DC

## 2017-09-28 NOTE — Progress Notes (Signed)
09/28/2017 2:58 PM   Earley Favor 1940-03-15 [id number]  Referring provider: Idelle Crouch, MD [facility] [place], [place] [address]  Chief Complaint  Patient presents with  . Benign Prostatic Hypertrophy    HPI: 77 year old male who presents for evaluation of lower urinary tract symptoms.  He presents with a 78-month history of lower urinary tract symptoms consisting of urinary frequency, urgency and nocturia x1-2.  He has some mild postvoid dribbling.  He was started on tamsulosin and states this helps his symptoms when he takes the medication but he does not always remember to take it.  He denies dysuria or gross hematuria.  He denies flank, abdominal, pelvic or scrotal pain.  He states I saw him approximately 6 years ago for hypogonadism.  He was briefly on TRT and decided to discontinue.  He had a PSA with his PCP and March 2019 which was 0.23.   PMH: Past Medical History:  Diagnosis Date  . Anemia   . Chronic diarrhea   . DDD (degenerative disc disease), cervical   . Hyperlipidemia   . Hypertension    borderline  . IDA (iron deficiency anemia)   . Low testosterone   . Vasovagal syncope     Surgical History: Past Surgical History:  Procedure Laterality Date  . CARDIAC CATHETERIZATION    . catracts Bilateral   . CHOLECYSTECTOMY    . COLONOSCOPY WITH PROPOFOL N/A 06/26/2016   Procedure: COLONOSCOPY WITH PROPOFOL;  Surgeon: Manya Silvas, MD;  Location: [facility] ENDOSCOPY;  Service: Endoscopy;  Laterality: N/A;  . ENDOSCOPIC RETROGRADE CHOLANGIOPANCREATOGRAPHY (ERCP) WITH PROPOFOL    . eyelid surgery    . FOOT SURGERY    . HEMORROIDECTOMY    . HERNIA REPAIR    . mrercp    . RHINOPLASTY    . TONSILLECTOMY    . WISDOM TOOTH EXTRACTION      Home Medications:  Allergies as of 09/28/2017      Reactions   Nsaids       Medication List        Accurate as of 09/28/17  2:58 PM. Always use your most recent med list.            acetaminophen 325 MG tablet Commonly known as:  TYLENOL Take 650 mg by mouth every 6 (six) hours as needed.   cetirizine 10 MG tablet Commonly known as:  ZYRTEC Take 10 mg by mouth daily.   ferrous sulfate 325 (65 FE) MG tablet Take 325 mg by mouth daily with breakfast.   multivitamin tablet Take 1 tablet by mouth daily.   tamsulosin 0.4 MG Caps capsule Commonly known as:  FLOMAX Take by mouth.   vitamin B-12 500 MCG tablet Commonly known as:  CYANOCOBALAMIN Take 500 mcg by mouth daily.   vitamin C 500 MG tablet Commonly known as:  ASCORBIC ACID Take 500 mg by mouth daily.       Allergies:  Allergies  Allergen Reactions  . Nsaids     Family History: No family history on file.  Social History:  reports that he has quit smoking. He has never used smokeless tobacco. He reports that he drinks about 8.4 oz of alcohol per week. He reports that he does not use drugs.  ROS: UROLOGY Frequent Urination?: Yes Hard to postpone urination?: Yes Burning/pain with urination?: No Get up at night to urinate?: Yes Leakage of urine?: Yes Urine stream starts and stops?: No Trouble starting stream?: No Do  you have to strain to urinate?: No Blood in urine?: No Urinary tract infection?: No Sexually transmitted disease?: No Injury to kidneys or bladder?: No Painful intercourse?: No Weak stream?: No Erection problems?: No Penile pain?: No  Gastrointestinal Nausea?: No Vomiting?: No Indigestion/heartburn?: No Diarrhea?: No Constipation?: No  Constitutional Fever: No Night sweats?: No Weight loss?: No Fatigue?: No  Skin Skin rash/lesions?: No Itching?: Yes  Eyes Blurred vision?: No Double vision?: No  Ears/Nose/Throat Sore throat?: No Sinus problems?: Yes  Hematologic/Lymphatic Swollen glands?: No Easy bruising?: Yes  Cardiovascular Leg swelling?: Yes Chest pain?: No  Respiratory Cough?: No Shortness of breath?: No  Endocrine Excessive thirst?:  No  Musculoskeletal Back pain?: Yes Joint pain?: Yes  Neurological Headaches?: No Dizziness?: No  Psychologic Depression?: No Anxiety?: No  Physical Exam: BP 137/74   Pulse 76   Wt 222 lb (100.7 kg)   BMI 35.83 kg/m   Constitutional:  Alert and oriented, No acute distress. HEENT: [place] AT, moist mucus membranes.  Trachea midline, no masses. Cardiovascular: No clubbing, cyanosis, or edema. Respiratory: Normal respiratory effort, no increased work of breathing. GI: Abdomen is soft, nontender, nondistended, no abdominal masses GU: No CVA tenderness.  Prostate 30 g, smooth without nodules. Lymph: No cervical or inguinal lymphadenopathy. Skin: No rashes, bruises or suspicious lesions. Neurologic: Grossly intact, no focal deficits, moving all 4 extremities. Psychiatric: Normal mood and affect.  Laboratory Data:  Urinalysis Dipstick negative; microscopy negative   Assessment & Plan:   77 year old male with storage related voiding symptoms with improvement on tamsulosin.  PVR by bladder scan today was 54 mL.  He states tamsulosin is effective when he takes it regularly.  He was taking it after supper and thinks he can remember better to take it in the morning.  The tamsulosin was refilled.  He would like to return here annually.  Return in about 1 year (around 09/29/2018) for Recheck.   Abbie Sons, [place] [address] [place], [place] [address] [phone]

## 2017-10-02 DIAGNOSIS — H40003 Preglaucoma, unspecified, bilateral: Secondary | ICD-10-CM | POA: Diagnosis not present

## 2017-10-15 DIAGNOSIS — M955 Acquired deformity of pelvis: Secondary | ICD-10-CM | POA: Diagnosis not present

## 2017-10-15 DIAGNOSIS — M9903 Segmental and somatic dysfunction of lumbar region: Secondary | ICD-10-CM | POA: Diagnosis not present

## 2017-10-15 DIAGNOSIS — M5432 Sciatica, left side: Secondary | ICD-10-CM | POA: Diagnosis not present

## 2017-10-15 DIAGNOSIS — M9905 Segmental and somatic dysfunction of pelvic region: Secondary | ICD-10-CM | POA: Diagnosis not present

## 2017-10-16 DIAGNOSIS — H40003 Preglaucoma, unspecified, bilateral: Secondary | ICD-10-CM | POA: Diagnosis not present

## 2017-11-07 DIAGNOSIS — M9903 Segmental and somatic dysfunction of lumbar region: Secondary | ICD-10-CM | POA: Diagnosis not present

## 2017-11-07 DIAGNOSIS — M5432 Sciatica, left side: Secondary | ICD-10-CM | POA: Diagnosis not present

## 2017-11-07 DIAGNOSIS — M955 Acquired deformity of pelvis: Secondary | ICD-10-CM | POA: Diagnosis not present

## 2017-11-07 DIAGNOSIS — M9905 Segmental and somatic dysfunction of pelvic region: Secondary | ICD-10-CM | POA: Diagnosis not present

## 2017-11-09 DIAGNOSIS — M5432 Sciatica, left side: Secondary | ICD-10-CM | POA: Diagnosis not present

## 2017-11-09 DIAGNOSIS — M9905 Segmental and somatic dysfunction of pelvic region: Secondary | ICD-10-CM | POA: Diagnosis not present

## 2017-11-09 DIAGNOSIS — M955 Acquired deformity of pelvis: Secondary | ICD-10-CM | POA: Diagnosis not present

## 2017-11-09 DIAGNOSIS — M9903 Segmental and somatic dysfunction of lumbar region: Secondary | ICD-10-CM | POA: Diagnosis not present

## 2017-11-13 DIAGNOSIS — M5432 Sciatica, left side: Secondary | ICD-10-CM | POA: Diagnosis not present

## 2017-11-13 DIAGNOSIS — M9905 Segmental and somatic dysfunction of pelvic region: Secondary | ICD-10-CM | POA: Diagnosis not present

## 2017-11-13 DIAGNOSIS — M955 Acquired deformity of pelvis: Secondary | ICD-10-CM | POA: Diagnosis not present

## 2017-11-13 DIAGNOSIS — M9903 Segmental and somatic dysfunction of lumbar region: Secondary | ICD-10-CM | POA: Diagnosis not present

## 2017-11-15 DIAGNOSIS — M955 Acquired deformity of pelvis: Secondary | ICD-10-CM | POA: Diagnosis not present

## 2017-11-15 DIAGNOSIS — M9905 Segmental and somatic dysfunction of pelvic region: Secondary | ICD-10-CM | POA: Diagnosis not present

## 2017-11-15 DIAGNOSIS — M9903 Segmental and somatic dysfunction of lumbar region: Secondary | ICD-10-CM | POA: Diagnosis not present

## 2017-11-15 DIAGNOSIS — M5432 Sciatica, left side: Secondary | ICD-10-CM | POA: Diagnosis not present

## 2017-11-19 DIAGNOSIS — M955 Acquired deformity of pelvis: Secondary | ICD-10-CM | POA: Diagnosis not present

## 2017-11-19 DIAGNOSIS — M5432 Sciatica, left side: Secondary | ICD-10-CM | POA: Diagnosis not present

## 2017-11-19 DIAGNOSIS — M9903 Segmental and somatic dysfunction of lumbar region: Secondary | ICD-10-CM | POA: Diagnosis not present

## 2017-11-19 DIAGNOSIS — M9905 Segmental and somatic dysfunction of pelvic region: Secondary | ICD-10-CM | POA: Diagnosis not present

## 2017-11-20 DIAGNOSIS — D509 Iron deficiency anemia, unspecified: Secondary | ICD-10-CM | POA: Diagnosis not present

## 2017-11-20 DIAGNOSIS — M5136 Other intervertebral disc degeneration, lumbar region: Secondary | ICD-10-CM | POA: Diagnosis not present

## 2017-11-20 DIAGNOSIS — Z79899 Other long term (current) drug therapy: Secondary | ICD-10-CM | POA: Diagnosis not present

## 2017-11-20 DIAGNOSIS — G8929 Other chronic pain: Secondary | ICD-10-CM | POA: Diagnosis not present

## 2017-11-20 DIAGNOSIS — M5442 Lumbago with sciatica, left side: Secondary | ICD-10-CM | POA: Diagnosis not present

## 2017-11-21 ENCOUNTER — Other Ambulatory Visit: Payer: Self-pay | Admitting: Internal Medicine

## 2017-11-21 DIAGNOSIS — G8929 Other chronic pain: Secondary | ICD-10-CM

## 2017-11-21 DIAGNOSIS — M5442 Lumbago with sciatica, left side: Principal | ICD-10-CM

## 2017-11-22 DIAGNOSIS — M9903 Segmental and somatic dysfunction of lumbar region: Secondary | ICD-10-CM | POA: Diagnosis not present

## 2017-11-22 DIAGNOSIS — M5432 Sciatica, left side: Secondary | ICD-10-CM | POA: Diagnosis not present

## 2017-11-22 DIAGNOSIS — M9905 Segmental and somatic dysfunction of pelvic region: Secondary | ICD-10-CM | POA: Diagnosis not present

## 2017-11-22 DIAGNOSIS — M955 Acquired deformity of pelvis: Secondary | ICD-10-CM | POA: Diagnosis not present

## 2017-12-02 NOTE — Progress Notes (Signed)
[place]  [phone] Fax:(336) [phone]  ID: Marcus Coleman OB: 1940/08/25  MR#: [id number]  KVQ#:[id number]  Patient Care Team: Idelle Crouch, MD as PCP - General (Internal Medicine)  CHIEF COMPLAINT: Iron deficiency anemia.  INTERVAL HISTORY: Patient is a 77 year old male who was noted to have a persistently decreased hemoglobin, B12, and iron stores on routine blood work.  He currently feels well and is asymptomatic.  He does not complain of weakness or fatigue today.  He has no neurologic complaints.  He denies any recent fevers or illnesses.  He has a good appetite and denies weight loss.  He has no chest pain or shortness of breath.  He denies any nausea, vomiting, constipation, or diarrhea.  He has no melena or hematochezia.  He has no urinary complaints.  Patient feels at his baseline no specific complaints today.  REVIEW OF SYSTEMS:   Review of Systems  Constitutional: Negative.  Negative for fever, malaise/fatigue and weight loss.  Respiratory: Negative.  Negative for cough, hemoptysis and shortness of breath.   Cardiovascular: Negative.  Negative for chest pain and leg swelling.  Gastrointestinal: Negative.  Negative for abdominal pain, blood in stool and melena.  Genitourinary: Negative.  Negative for hematuria.  Musculoskeletal: Negative.  Negative for back pain.  Skin: Negative.  Negative for rash.  Neurological: Negative.  Negative for focal weakness, weakness and headaches.  Psychiatric/Behavioral: Negative.  The patient is not nervous/anxious.     As per HPI. Otherwise, a complete review of systems is negative.  PAST MEDICAL HISTORY: Past Medical History:  Diagnosis Date  . Anemia   . Chronic diarrhea   . DDD (degenerative disc disease), cervical   . Hyperlipidemia   . Hypertension    borderline  . IDA (iron deficiency anemia)   . Low testosterone   . Vasovagal syncope     PAST SURGICAL HISTORY: Past Surgical History:    Procedure Laterality Date  . CARDIAC CATHETERIZATION    . catracts Bilateral   . CHOLECYSTECTOMY    . COLONOSCOPY WITH PROPOFOL N/A 06/26/2016   Procedure: COLONOSCOPY WITH PROPOFOL;  Surgeon: Manya Silvas, MD;  Location: [facility] ENDOSCOPY;  Service: Endoscopy;  Laterality: N/A;  . ENDOSCOPIC RETROGRADE CHOLANGIOPANCREATOGRAPHY (ERCP) WITH PROPOFOL    . eyelid surgery    . FOOT SURGERY    . HEMORROIDECTOMY    . HERNIA REPAIR    . mrercp    . RHINOPLASTY    . TONSILLECTOMY    . WISDOM TOOTH EXTRACTION      FAMILY HISTORY: Family History  Problem Relation Age of Onset  . Hypothyroidism Mother   . Diabetes Mother   . Lung cancer Mother     ADVANCED DIRECTIVES (Y/N):  N  HEALTH MAINTENANCE: Social History   Tobacco Use  . Smoking status: Former Smoker    Packs/day: 1.00    Years: 25.00    Pack years: 25.00    Types: Cigarettes    Last attempt to quit: 12/05/1986    Years since quitting: 31.0  . Smokeless tobacco: Never Used  Substance Use Topics  . Alcohol use: Yes    Alcohol/week: 14.0 standard drinks    Types: 14 Cans of beer per week    Comment: 2 beers per day  . Drug use: No     Colonoscopy:  PAP:  Bone density:  Lipid panel:  Allergies  Allergen Reactions  . Nsaids     Current Outpatient Medications  Medication Sig Dispense Refill  .  cetirizine (ZYRTEC) 10 MG tablet Take 10 mg by mouth daily.    . ferrous sulfate 325 (65 FE) MG tablet Take 325 mg by mouth daily with breakfast.    . Multiple Vitamin (MULTIVITAMIN) tablet Take 1 tablet by mouth daily.    . tamsulosin (FLOMAX) 0.4 MG CAPS capsule Take 1 capsule (0.4 mg total) by mouth daily after breakfast. 90 capsule 3  . vitamin B-12 (CYANOCOBALAMIN) 500 MCG tablet Take 500 mcg by mouth daily.    . vitamin C (ASCORBIC ACID) 500 MG tablet Take 500 mg by mouth daily.    Marland Kitchen acetaminophen (TYLENOL) 325 MG tablet Take 650 mg by mouth every 6 (six) hours as needed.     No current facility-administered  medications for this visit.     OBJECTIVE: Vitals:   12/04/17 1133  BP: 130/73  Pulse: 91  Resp: 18  Temp: (!) 94.7 F (34.8 C)     Body mass index is 35.72 kg/m.    ECOG FS:0 - Asymptomatic  General: Well-developed, well-nourished, no acute distress. Eyes: Pink conjunctiva, anicteric sclera. HEENT: Normocephalic, moist mucous membranes, clear oropharnyx. Lungs: Clear to auscultation bilaterally. Heart: Regular rate and rhythm. No rubs, murmurs, or gallops. Abdomen: Soft, nontender, nondistended. No organomegaly noted, normoactive bowel sounds. Musculoskeletal: No edema, cyanosis, or clubbing. Neuro: Alert, answering all questions appropriately. Cranial nerves grossly intact. Skin: No rashes or petechiae noted. Psych: Normal affect. Lymphatics: No cervical, calvicular, axillary or inguinal LAD.   LAB RESULTS:  No results found for: NA, K, CL, CO2, GLUCOSE, BUN, CREATININE, CALCIUM, PROT, ALBUMIN, AST, ALT, ALKPHOS, BILITOT, GFRNONAA, GFRAA  No results found for: WBC, NEUTROABS, HGB, HCT, MCV, PLT   STUDIES: Mr Lumbar Spine Wo Contrast  Result Date: 12/07/2017 CLINICAL DATA:  Chronic left-sided low back pain with sciatica EXAM: MRI LUMBAR SPINE WITHOUT CONTRAST TECHNIQUE: Multiplanar, multisequence MR imaging of the lumbar spine was performed. No intravenous contrast was administered. COMPARISON:  02/28/2016 FINDINGS: Segmentation:  5 lumbar type vertebral bodies Alignment: Exaggerated kyphosis at the thoracolumbar junction and exaggerated lumbar lordosis. Grade 1 anterolisthesis at L5-S1. Scoliosis. Vertebrae: Remote T12 compression fracture. No acute fracture, discitis, or aggressive bone lesion. L1-2 interbody ankylosis. Conus medullaris and cauda equina: Conus extends to the L1-2 level. Conus and cauda equina appear normal. Paraspinal and other soft tissues: Prominent fatty atrophy of intrinsic back muscles. Right suprarenal cyst. Disc levels: T12- L1: Advanced disc narrowing  with left far-lateral osteophyte that is bridging. No impingement L1-L2: Interbody ankylosis.  No impingement L2-L3: Asymmetric rightward disc collapse and bulging with endplate spurring. Right facet spurring. Moderate right foraminal narrowing. The canal is patent L3-L4: Narrowing of the disc. Degenerative facet spurring. No impingement L4-L5: Disc narrowing and mild bulging. Posterior element hypertrophy. No impingement L5-S1:Facet degeneration with asymmetric left far-lateral spurring. Disc bulge contacts the left L5 nerve root. IMPRESSION: 1. Stable from 02/28/2016. 2. L2-3 moderate right foraminal narrowing. 3. L5-S1 small left foraminal protrusion contacting the L5 nerve root. 4. Diffusely patent spinal canal. Electronically Signed   By: Monte Fantasia M.D.   On: 12/07/2017 15:51    ASSESSMENT: Iron deficiency anemia  PLAN:   1. Iron deficiency anemia: On November 20, 2017 patient was noted to have a hemoglobin of 9.1.  His total iron was 22, iron saturation 4%, and ferritin 5.  Patient had a normal colonoscopy on June 26, 2016.  The remainder of his laboratory work is either negative or within normal limits except for a decreased B12 level.  Patient  will return to clinic in 1 and 2 weeks to receive 510 mg IV Feraheme.  He was then return to clinic in 3 months with repeat laboratory work and further evaluation. 2.  B12 deficiency: Patient's most recent B12 level was decreased to 266.  Return to clinic in 1 week for B12 injection and then every 4 weeks thereafter.  He was then return to clinic as above with repeat laboratory work and further evaluation.  I spent a total of 45 minutes face-to-face with the patient of which greater than 50% of the visit was spent in counseling and coordination of care as detailed above.   Patient expressed understanding and was in agreement with this plan. He also understands that He can call clinic at any time with any questions, concerns, or complaints.     Lloyd Huger, MD   12/08/2017 6:23 AM

## 2017-12-04 ENCOUNTER — Encounter (INDEPENDENT_AMBULATORY_CARE_PROVIDER_SITE_OTHER): Payer: Self-pay

## 2017-12-04 ENCOUNTER — Inpatient Hospital Stay: Payer: PPO | Attending: Oncology | Admitting: Oncology

## 2017-12-04 ENCOUNTER — Encounter: Payer: Self-pay | Admitting: Oncology

## 2017-12-04 ENCOUNTER — Other Ambulatory Visit: Payer: Self-pay

## 2017-12-04 VITALS — BP 130/73 | HR 91 | Temp 94.7°F | Resp 18 | Ht 66.54 in | Wt 224.9 lb

## 2017-12-04 DIAGNOSIS — E539 Vitamin B deficiency, unspecified: Secondary | ICD-10-CM | POA: Diagnosis not present

## 2017-12-04 DIAGNOSIS — Z87891 Personal history of nicotine dependence: Secondary | ICD-10-CM | POA: Insufficient documentation

## 2017-12-04 DIAGNOSIS — E538 Deficiency of other specified B group vitamins: Secondary | ICD-10-CM | POA: Diagnosis not present

## 2017-12-04 DIAGNOSIS — D509 Iron deficiency anemia, unspecified: Secondary | ICD-10-CM | POA: Insufficient documentation

## 2017-12-04 NOTE — Progress Notes (Signed)
Here for new pt evaluation.  

## 2017-12-07 ENCOUNTER — Ambulatory Visit
Admission: RE | Admit: 2017-12-07 | Discharge: 2017-12-07 | Disposition: A | Discharge status: Home / Self Care | Payer: PPO | Source: Ambulatory Visit | Attending: Internal Medicine | Admitting: Internal Medicine

## 2017-12-07 DIAGNOSIS — M5126 Other intervertebral disc displacement, lumbar region: Secondary | ICD-10-CM | POA: Diagnosis not present

## 2017-12-07 DIAGNOSIS — M5127 Other intervertebral disc displacement, lumbosacral region: Secondary | ICD-10-CM | POA: Diagnosis not present

## 2017-12-07 DIAGNOSIS — G8929 Other chronic pain: Secondary | ICD-10-CM | POA: Diagnosis not present

## 2017-12-07 DIAGNOSIS — M5442 Lumbago with sciatica, left side: Secondary | ICD-10-CM | POA: Diagnosis not present

## 2017-12-07 DIAGNOSIS — M545 Low back pain: Secondary | ICD-10-CM | POA: Diagnosis not present

## 2017-12-11 ENCOUNTER — Inpatient Hospital Stay: Payer: PPO | Attending: Oncology

## 2017-12-11 VITALS — BP 133/83 | HR 64 | Resp 20

## 2017-12-11 DIAGNOSIS — Z87891 Personal history of nicotine dependence: Secondary | ICD-10-CM | POA: Diagnosis not present

## 2017-12-11 DIAGNOSIS — D509 Iron deficiency anemia, unspecified: Secondary | ICD-10-CM | POA: Insufficient documentation

## 2017-12-11 DIAGNOSIS — E538 Deficiency of other specified B group vitamins: Secondary | ICD-10-CM | POA: Diagnosis not present

## 2017-12-11 MED ORDER — SODIUM CHLORIDE 0.9 % IV SOLN
510.0000 mg | Freq: Once | INTRAVENOUS | Status: AC
  Administered 2017-12-11: 510 mg via INTRAVENOUS
  Filled 2017-12-11: qty 17

## 2017-12-11 MED ORDER — CYANOCOBALAMIN 1000 MCG/ML IJ SOLN
1000.0000 ug | Freq: Once | INTRAMUSCULAR | Status: AC
  Administered 2017-12-11: 1000 ug via INTRAMUSCULAR
  Filled 2017-12-11: qty 1

## 2017-12-11 MED ORDER — SODIUM CHLORIDE 0.9 % IV SOLN
Freq: Once | INTRAVENOUS | Status: AC
  Administered 2017-12-11: 14:00:00 via INTRAVENOUS
  Filled 2017-12-11: qty 250

## 2017-12-17 DIAGNOSIS — M5136 Other intervertebral disc degeneration, lumbar region: Secondary | ICD-10-CM | POA: Diagnosis not present

## 2017-12-17 DIAGNOSIS — M5416 Radiculopathy, lumbar region: Secondary | ICD-10-CM | POA: Diagnosis not present

## 2017-12-17 DIAGNOSIS — M5030 Other cervical disc degeneration, unspecified cervical region: Secondary | ICD-10-CM | POA: Diagnosis not present

## 2017-12-18 ENCOUNTER — Inpatient Hospital Stay: Payer: PPO

## 2017-12-18 VITALS — BP 119/75 | HR 65 | Temp 97.0°F | Resp 20

## 2017-12-18 DIAGNOSIS — D509 Iron deficiency anemia, unspecified: Secondary | ICD-10-CM | POA: Diagnosis not present

## 2017-12-18 MED ORDER — SODIUM CHLORIDE 0.9 % IV SOLN
Freq: Once | INTRAVENOUS | Status: AC
  Administered 2017-12-18: 14:00:00 via INTRAVENOUS
  Filled 2017-12-18: qty 250

## 2017-12-18 MED ORDER — SODIUM CHLORIDE 0.9 % IV SOLN
510.0000 mg | Freq: Once | INTRAVENOUS | Status: AC
  Administered 2017-12-18: 510 mg via INTRAVENOUS
  Filled 2017-12-18: qty 17

## 2018-01-03 ENCOUNTER — Encounter: Payer: Self-pay | Admitting: Internal Medicine

## 2018-01-07 ENCOUNTER — Inpatient Hospital Stay: Payer: PPO

## 2018-01-07 DIAGNOSIS — D509 Iron deficiency anemia, unspecified: Secondary | ICD-10-CM

## 2018-01-07 MED ORDER — CYANOCOBALAMIN 1000 MCG/ML IJ SOLN
1000.0000 ug | Freq: Once | INTRAMUSCULAR | Status: AC
  Administered 2018-01-07: 1000 ug via INTRAMUSCULAR

## 2018-01-08 ENCOUNTER — Ambulatory Visit: Payer: PPO

## 2018-02-04 ENCOUNTER — Inpatient Hospital Stay: Payer: PPO | Attending: Oncology

## 2018-02-04 DIAGNOSIS — M9903 Segmental and somatic dysfunction of lumbar region: Secondary | ICD-10-CM | POA: Diagnosis not present

## 2018-02-04 DIAGNOSIS — M5432 Sciatica, left side: Secondary | ICD-10-CM | POA: Diagnosis not present

## 2018-02-04 DIAGNOSIS — E538 Deficiency of other specified B group vitamins: Secondary | ICD-10-CM | POA: Diagnosis not present

## 2018-02-04 DIAGNOSIS — M9905 Segmental and somatic dysfunction of pelvic region: Secondary | ICD-10-CM | POA: Diagnosis not present

## 2018-02-04 DIAGNOSIS — Z87891 Personal history of nicotine dependence: Secondary | ICD-10-CM | POA: Insufficient documentation

## 2018-02-04 DIAGNOSIS — D509 Iron deficiency anemia, unspecified: Secondary | ICD-10-CM | POA: Diagnosis not present

## 2018-02-04 DIAGNOSIS — M955 Acquired deformity of pelvis: Secondary | ICD-10-CM | POA: Diagnosis not present

## 2018-02-04 DIAGNOSIS — Z79899 Other long term (current) drug therapy: Secondary | ICD-10-CM | POA: Diagnosis not present

## 2018-02-04 MED ORDER — CYANOCOBALAMIN 1000 MCG/ML IJ SOLN
1000.0000 ug | Freq: Once | INTRAMUSCULAR | Status: AC
  Administered 2018-02-04: 1000 ug via INTRAMUSCULAR

## 2018-02-05 ENCOUNTER — Ambulatory Visit: Payer: PPO

## 2018-02-25 ENCOUNTER — Other Ambulatory Visit: Payer: PPO

## 2018-02-25 ENCOUNTER — Ambulatory Visit: Payer: PPO

## 2018-02-25 ENCOUNTER — Ambulatory Visit: Payer: PPO | Admitting: Oncology

## 2018-03-04 DIAGNOSIS — M955 Acquired deformity of pelvis: Secondary | ICD-10-CM | POA: Diagnosis not present

## 2018-03-04 DIAGNOSIS — M9905 Segmental and somatic dysfunction of pelvic region: Secondary | ICD-10-CM | POA: Diagnosis not present

## 2018-03-04 DIAGNOSIS — M5432 Sciatica, left side: Secondary | ICD-10-CM | POA: Diagnosis not present

## 2018-03-04 DIAGNOSIS — M9903 Segmental and somatic dysfunction of lumbar region: Secondary | ICD-10-CM | POA: Diagnosis not present

## 2018-03-08 ENCOUNTER — Other Ambulatory Visit: Payer: Self-pay | Admitting: *Deleted

## 2018-03-08 ENCOUNTER — Inpatient Hospital Stay: Payer: PPO | Attending: Oncology

## 2018-03-08 ENCOUNTER — Other Ambulatory Visit: Payer: Self-pay

## 2018-03-08 DIAGNOSIS — D509 Iron deficiency anemia, unspecified: Secondary | ICD-10-CM | POA: Insufficient documentation

## 2018-03-08 DIAGNOSIS — D649 Anemia, unspecified: Secondary | ICD-10-CM

## 2018-03-08 DIAGNOSIS — E539 Vitamin B deficiency, unspecified: Secondary | ICD-10-CM | POA: Diagnosis not present

## 2018-03-08 DIAGNOSIS — Z87891 Personal history of nicotine dependence: Secondary | ICD-10-CM | POA: Diagnosis not present

## 2018-03-08 LAB — CBC WITH DIFFERENTIAL/PLATELET
Abs Immature Granulocytes: 0.01 10*3/uL (ref 0.00–0.07)
Basophils Absolute: 0 10*3/uL (ref 0.0–0.1)
Basophils Relative: 0 %
Eosinophils Absolute: 0.2 10*3/uL (ref 0.0–0.5)
Eosinophils Relative: 3 %
HEMATOCRIT: 40.6 % (ref 39.0–52.0)
Hemoglobin: 12.6 g/dL — ABNORMAL LOW (ref 13.0–17.0)
Immature Granulocytes: 0 %
Lymphocytes Relative: 20 %
Lymphs Abs: 1 10*3/uL (ref 0.7–4.0)
MCH: 29.4 pg (ref 26.0–34.0)
MCHC: 31 g/dL (ref 30.0–36.0)
MCV: 94.6 fL (ref 80.0–100.0)
Monocytes Absolute: 0.5 10*3/uL (ref 0.1–1.0)
Monocytes Relative: 10 %
NEUTROS ABS: 3.3 10*3/uL (ref 1.7–7.7)
Neutrophils Relative %: 67 %
Platelets: 193 10*3/uL (ref 150–400)
RBC: 4.29 MIL/uL (ref 4.22–5.81)
RDW: 14.6 % (ref 11.5–15.5)
WBC: 5 10*3/uL (ref 4.0–10.5)
nRBC: 0 % (ref 0.0–0.2)

## 2018-03-08 LAB — IRON AND TIBC
Iron: 36 ug/dL — ABNORMAL LOW (ref 45–182)
SATURATION RATIOS: 8 % — AB (ref 17.9–39.5)
TIBC: 436 ug/dL (ref 250–450)
UIBC: 400 ug/dL

## 2018-03-08 LAB — VITAMIN B12: Vitamin B-12: 322 pg/mL (ref 180–914)

## 2018-03-08 LAB — FERRITIN: Ferritin: 9 ng/mL — ABNORMAL LOW (ref 24–336)

## 2018-03-08 NOTE — Progress Notes (Signed)
[place]  Telephone:(336) [phone] Fax:(336) [phone]  ID: Marcus Coleman OB: [birth year]  MR#: [id number]  BDZ#:[id number]  Patient Care Team: Idelle Crouch, MD as PCP - General (Internal Medicine)  CHIEF COMPLAINT: Iron deficiency anemia.  INTERVAL HISTORY: Patient returns to clinic today for repeat laboratory work, further evaluation, and consideration of IV iron.  He continues to feel well and remains asymptomatic.  He does not complain of any weakness or fatigue today. He has no neurologic complaints.  He denies any recent fevers or illnesses.  He has a good appetite and denies weight loss.  He has no chest pain or shortness of breath.  He denies any nausea, vomiting, constipation, or diarrhea.  He has no melena or hematochezia.  He has no urinary complaints.  Patient offers no specific complaints today.  REVIEW OF SYSTEMS:   Review of Systems  Constitutional: Negative.  Negative for fever, malaise/fatigue and weight loss.  Respiratory: Negative.  Negative for cough, hemoptysis and shortness of breath.   Cardiovascular: Negative.  Negative for chest pain and leg swelling.  Gastrointestinal: Negative.  Negative for abdominal pain, blood in stool and melena.  Genitourinary: Negative.  Negative for hematuria.  Musculoskeletal: Negative.  Negative for back pain.  Skin: Negative.  Negative for rash.  Neurological: Negative.  Negative for focal weakness, weakness and headaches.  Psychiatric/Behavioral: Negative.  The patient is not nervous/anxious.     As per HPI. Otherwise, a complete review of systems is negative.  PAST MEDICAL HISTORY: Past Medical History:  Diagnosis Date  . Anemia   . Chronic diarrhea   . DDD (degenerative disc disease), cervical   . Hyperlipidemia   . Hypertension    borderline  . IDA (iron deficiency anemia)   . Low testosterone   . Vasovagal syncope     PAST SURGICAL HISTORY: Past Surgical History:  Procedure Laterality Date    . CARDIAC CATHETERIZATION    . catracts Bilateral   . CHOLECYSTECTOMY    . COLONOSCOPY WITH PROPOFOL N/A 06/26/2016   Procedure: COLONOSCOPY WITH PROPOFOL;  Surgeon: Manya Silvas, MD;  Location[facility] ENDOSCOPY;  Service: Endoscopy;  Laterality: N/A;  . ENDOSCOPIC RETROGRADE CHOLANGIOPANCREATOGRAPHY (ERCP) WITH PROPOFOL    . eyelid surgery    . FOOT SURGERY    . HEMORROIDECTOMY    . HERNIA REPAIR    . mrercp    . RHINOPLASTY    . TONSILLECTOMY    . WISDOM TOOTH EXTRACTION      FAMILY HISTORY: Family History  Problem Relation Age of Onset  . Hypothyroidism Mother   . Diabetes Mother   . Lung cancer Mother     ADVANCED DIRECTIVES (Y/N):  N  HEALTH MAINTENANCE: Social History   Tobacco Use  . Smoking status: Former Smoker    Packs/day: 1.00    Years: 25.00    Pack years: 25.00    Types: Cigarettes    Last attempt to quit: 12/05/1986    Years since quitting: 31.2  . Smokeless tobacco: Never Used  Substance Use Topics  . Alcohol use: Yes    Alcohol/week: 14.0 standard drinks    Types: 14 Cans of beer per week    Comment: 2 beers per day  . Drug use: No     Colonoscopy:  PAP:  Bone density:  Lipid panel:  Allergies  Allergen Reactions  . Nsaids     Current Outpatient Medications  Medication Sig Dispense Refill  . acetaminophen (TYLENOL) 325 MG tablet Take  650 mg by mouth every 6 (six) hours as needed.    . cetirizine (ZYRTEC) 10 MG tablet Take 10 mg by mouth daily.    . Multiple Vitamin (MULTIVITAMIN) tablet Take 1 tablet by mouth daily.    . tamsulosin (FLOMAX) 0.4 MG CAPS capsule Take 1 capsule (0.4 mg total) by mouth daily after breakfast. 90 capsule 3  . vitamin B-12 (CYANOCOBALAMIN) 500 MCG tablet Take 500 mcg by mouth daily.    . vitamin C (ASCORBIC ACID) 500 MG tablet Take 500 mg by mouth daily.    . ferrous sulfate 325 (65 FE) MG tablet Take 325 mg by mouth daily with breakfast.     No current facility-administered medications for this visit.      OBJECTIVE: Vitals:   03/14/18 1422  BP: (!) 150/90  Pulse: 78  Temp: 97.6 F (36.4 C)     Body mass index is 35.51 kg/m.    ECOG FS:0 - Asymptomatic  General: Well-developed, well-nourished, no acute distress. Eyes: Pink conjunctiva, anicteric sclera. HEENT: Normocephalic, moist mucous membranes. Lungs: Clear to auscultation bilaterally. Heart: Regular rate and rhythm. No rubs, murmurs, or gallops. Abdomen: Soft, nontender, nondistended. No organomegaly noted, normoactive bowel sounds. Musculoskeletal: No edema, cyanosis, or clubbing. Neuro: Alert, answering all questions appropriately. Cranial nerves grossly intact. Skin: No rashes or petechiae noted. Psych: Normal affect.  LAB RESULTS:  No results found for: NA, K, CL, CO2, GLUCOSE, BUN, CREATININE, CALCIUM, PROT, ALBUMIN, AST, ALT, ALKPHOS, BILITOT, GFRNONAA, GFRAA  Lab Results  Component Value Date   WBC 5.0 03/08/2018   NEUTROABS 3.3 03/08/2018   HGB 12.6 (L) 03/08/2018   HCT 40.6 03/08/2018   MCV 94.6 03/08/2018   PLT 193 03/08/2018   Lab Results  Component Value Date   IRON 36 (L) 03/08/2018   TIBC 436 03/08/2018   IRONPCTSAT 8 (L) 03/08/2018   Lab Results  Component Value Date   FERRITIN 9 (L) 03/08/2018     STUDIES: No results found.  ASSESSMENT: Iron deficiency anemia  PLAN:   1. Iron deficiency anemia:  Patient had a normal colonoscopy on June 26, 2016.  His hemoglobin has significantly improved and is now 12.6, although he continues to have decreased iron stores.  Patient's B12 is now within normal limits.  Previously, the remainder of his laboratory work was either negative or within normal limits.  Proceed with 1 additional infusion of 510 mg IV Feraheme today.  Return to clinic in 3 months with repeat laboratory work and further evaluation. 2.  B12 deficiency: Patient's most recent B12 level was within normal limits.  He does not require additional B12 injections at this time.  Repeat  laboratory work in 3 months as above.    I spent a total of 30 minutes face-to-face with the patient of which greater than 50% of the visit was spent in counseling and coordination of care as detailed above.   Patient expressed understanding and was in agreement with this plan. He also understands that He can call clinic at any time with any questions, concerns, or complaints.    Lloyd Huger, MD   03/15/2018 9:15 AM

## 2018-03-12 ENCOUNTER — Other Ambulatory Visit: Payer: PPO

## 2018-03-14 ENCOUNTER — Other Ambulatory Visit: Payer: Self-pay

## 2018-03-14 ENCOUNTER — Inpatient Hospital Stay: Payer: PPO | Attending: Oncology | Admitting: Oncology

## 2018-03-14 ENCOUNTER — Inpatient Hospital Stay: Payer: PPO

## 2018-03-14 VITALS — BP 150/90 | HR 78 | Temp 97.6°F | Ht 66.0 in | Wt 220.0 lb

## 2018-03-14 DIAGNOSIS — D509 Iron deficiency anemia, unspecified: Secondary | ICD-10-CM

## 2018-03-14 DIAGNOSIS — Z79899 Other long term (current) drug therapy: Secondary | ICD-10-CM | POA: Insufficient documentation

## 2018-03-14 MED ORDER — SODIUM CHLORIDE 0.9 % IV SOLN
510.0000 mg | Freq: Once | INTRAVENOUS | Status: AC
  Administered 2018-03-14: 510 mg via INTRAVENOUS
  Filled 2018-03-14: qty 17

## 2018-03-14 MED ORDER — SODIUM CHLORIDE 0.9 % IV SOLN
Freq: Once | INTRAVENOUS | Status: AC
  Administered 2018-03-14: 15:00:00 via INTRAVENOUS
  Filled 2018-03-14: qty 250

## 2018-03-14 NOTE — Progress Notes (Signed)
Patient is here today to follow up on her iron deficiency anemia. Patient stated that he had been doing well. Patient also wanted to know if he should start taking his iron medication.

## 2018-04-15 DIAGNOSIS — H40003 Preglaucoma, unspecified, bilateral: Secondary | ICD-10-CM | POA: Diagnosis not present

## 2018-05-21 DIAGNOSIS — Z125 Encounter for screening for malignant neoplasm of prostate: Secondary | ICD-10-CM | POA: Diagnosis not present

## 2018-05-21 DIAGNOSIS — Z77098 Contact with and (suspected) exposure to other hazardous, chiefly nonmedicinal, chemicals: Secondary | ICD-10-CM | POA: Diagnosis not present

## 2018-05-21 DIAGNOSIS — M5135 Other intervertebral disc degeneration, thoracolumbar region: Secondary | ICD-10-CM | POA: Diagnosis not present

## 2018-05-21 DIAGNOSIS — Z0000 Encounter for general adult medical examination without abnormal findings: Secondary | ICD-10-CM | POA: Diagnosis not present

## 2018-05-21 DIAGNOSIS — D509 Iron deficiency anemia, unspecified: Secondary | ICD-10-CM | POA: Diagnosis not present

## 2018-05-21 DIAGNOSIS — R7989 Other specified abnormal findings of blood chemistry: Secondary | ICD-10-CM | POA: Diagnosis not present

## 2018-05-21 DIAGNOSIS — E7800 Pure hypercholesterolemia, unspecified: Secondary | ICD-10-CM | POA: Diagnosis not present

## 2018-05-21 DIAGNOSIS — K449 Diaphragmatic hernia without obstruction or gangrene: Secondary | ICD-10-CM | POA: Diagnosis not present

## 2018-05-21 DIAGNOSIS — Z79899 Other long term (current) drug therapy: Secondary | ICD-10-CM | POA: Diagnosis not present

## 2018-06-13 ENCOUNTER — Other Ambulatory Visit: Payer: Self-pay

## 2018-06-13 ENCOUNTER — Inpatient Hospital Stay: Payer: PPO | Attending: Oncology

## 2018-06-13 DIAGNOSIS — D649 Anemia, unspecified: Secondary | ICD-10-CM

## 2018-06-13 DIAGNOSIS — E538 Deficiency of other specified B group vitamins: Secondary | ICD-10-CM | POA: Insufficient documentation

## 2018-06-13 DIAGNOSIS — D509 Iron deficiency anemia, unspecified: Secondary | ICD-10-CM | POA: Insufficient documentation

## 2018-06-13 LAB — CBC WITH DIFFERENTIAL/PLATELET
Abs Immature Granulocytes: 0.02 10*3/uL (ref 0.00–0.07)
Basophils Absolute: 0 10*3/uL (ref 0.0–0.1)
Basophils Relative: 0 %
Eosinophils Absolute: 0.1 10*3/uL (ref 0.0–0.5)
Eosinophils Relative: 2 %
HCT: 41.4 % (ref 39.0–52.0)
Hemoglobin: 13.7 g/dL (ref 13.0–17.0)
Immature Granulocytes: 0 %
Lymphocytes Relative: 27 %
Lymphs Abs: 1.4 10*3/uL (ref 0.7–4.0)
MCH: 31.8 pg (ref 26.0–34.0)
MCHC: 33.1 g/dL (ref 30.0–36.0)
MCV: 96.1 fL (ref 80.0–100.0)
Monocytes Absolute: 0.5 10*3/uL (ref 0.1–1.0)
Monocytes Relative: 10 %
Neutro Abs: 3.2 10*3/uL (ref 1.7–7.7)
Neutrophils Relative %: 61 %
Platelets: 180 10*3/uL (ref 150–400)
RBC: 4.31 MIL/uL (ref 4.22–5.81)
RDW: 13.7 % (ref 11.5–15.5)
WBC: 5.2 10*3/uL (ref 4.0–10.5)
nRBC: 0 % (ref 0.0–0.2)

## 2018-06-13 LAB — IRON AND TIBC
Iron: 83 ug/dL (ref 45–182)
Saturation Ratios: 18 % (ref 17.9–39.5)
TIBC: 471 ug/dL — ABNORMAL HIGH (ref 250–450)
UIBC: 388 ug/dL

## 2018-06-13 LAB — FERRITIN: Ferritin: 14 ng/mL — ABNORMAL LOW (ref 24–336)

## 2018-06-13 LAB — VITAMIN B12: Vitamin B-12: 206 pg/mL (ref 180–914)

## 2018-06-13 NOTE — Progress Notes (Signed)
[place]  Telephone:(336) [phone] Fax:(336) [phone]  ID: ISSAIH [name] OB: 09/08/1940  MR#: [id number]  YQI#:[id number]  Patient Care Team: Idelle Crouch, MD as PCP - General (Internal Medicine)  Virtual Visit via Telephone Note  I connected with Marcus Coleman on June 14, 2018 at 10:45 AM EDT by telephone and verified that I am speaking with the correct person using two identifiers.   I discussed the limitations, risks, security and privacy concerns of performing an evaluation and management service by telephone and the availability of in person appointments. I also discussed with the patient that there may be a patient responsible charge related to this service. The patient expressed understanding and agreed to proceed.   CHIEF COMPLAINT: Iron deficiency anemia.  INTERVAL HISTORY: Patient agreed to evaluation and discussion his laboratory work via phone.  He continues to feel well and remains asymptomatic.  He does not complain of any further weakness or fatigue.  He has no neurologic complaints.  He denies any recent fevers or illnesses.  He has a good appetite and denies weight loss.  He denies any chest pain, shortness of breath, cough, or hemoptysis.  He denies any nausea, vomiting, constipation, or diarrhea.  He has no melena or hematochezia.  He has no urinary complaints.  Patient feels at his baseline offers no specific complaints today.  REVIEW OF SYSTEMS:   Review of Systems  Constitutional: Negative.  Negative for fever, malaise/fatigue and weight loss.  Respiratory: Negative.  Negative for cough, hemoptysis and shortness of breath.   Cardiovascular: Negative.  Negative for chest pain and leg swelling.  Gastrointestinal: Negative.  Negative for abdominal pain, blood in stool and melena.  Genitourinary: Negative.  Negative for hematuria.  Musculoskeletal: Negative.  Negative for back pain.  Skin: Negative.  Negative for rash.  Neurological: Negative.   Negative for focal weakness, weakness and headaches.  Psychiatric/Behavioral: Negative.  The patient is not nervous/anxious.     As per HPI. Otherwise, a complete review of systems is negative.  PAST MEDICAL HISTORY: Past Medical History:  Diagnosis Date  . Anemia   . Chronic diarrhea   . DDD (degenerative disc disease), cervical   . Hyperlipidemia   . Hypertension    borderline  . IDA (iron deficiency anemia)   . Low testosterone   . Vasovagal syncope     PAST SURGICAL HISTORY: Past Surgical History:  Procedure Laterality Date  . CARDIAC CATHETERIZATION    . catracts Bilateral   . CHOLECYSTECTOMY    . COLONOSCOPY WITH PROPOFOL N/A 06/26/2016   Procedure: COLONOSCOPY WITH PROPOFOL;  Surgeon: Manya Silvas, MD;  Location: [facility] ENDOSCOPY;  Service: Endoscopy;  Laterality: N/A;  . ENDOSCOPIC RETROGRADE CHOLANGIOPANCREATOGRAPHY (ERCP) WITH PROPOFOL    . eyelid surgery    . FOOT SURGERY    . HEMORROIDECTOMY    . HERNIA REPAIR    . mrercp    . RHINOPLASTY    . TONSILLECTOMY    . WISDOM TOOTH EXTRACTION      FAMILY HISTORY: Family History  Problem Relation Age of Onset  . Hypothyroidism Mother   . Diabetes Mother   . Lung cancer Mother     ADVANCED DIRECTIVES (Y/N):  N  HEALTH MAINTENANCE: Social History   Tobacco Use  . Smoking status: Former Smoker    Packs/day: 1.00    Years: 25.00    Pack years: 25.00    Types: Cigarettes    Last attempt to quit: 12/05/1986  Years since quitting: 31.5  . Smokeless tobacco: Never Used  Substance Use Topics  . Alcohol use: Yes    Alcohol/week: 14.0 standard drinks    Types: 14 Cans of beer per week    Comment: 2 beers per day  . Drug use: No     Colonoscopy:  PAP:  Bone density:  Lipid panel:  Allergies  Allergen Reactions  . Nsaids     Current Outpatient Medications  Medication Sig Dispense Refill  . acetaminophen (TYLENOL) 325 MG tablet Take 650 mg by mouth every 6 (six) hours as needed.    .  cetirizine (ZYRTEC) 10 MG tablet Take 10 mg by mouth daily.    . tamsulosin (FLOMAX) 0.4 MG CAPS capsule Take 1 capsule (0.4 mg total) by mouth daily after breakfast. 90 capsule 3  . vitamin C (ASCORBIC ACID) 500 MG tablet Take 500 mg by mouth daily.    . ferrous sulfate 325 (65 FE) MG tablet Take 325 mg by mouth daily with breakfast.    . Multiple Vitamin (MULTIVITAMIN) tablet Take 1 tablet by mouth daily.    . vitamin B-12 (CYANOCOBALAMIN) 500 MCG tablet Take 500 mcg by mouth daily.     No current facility-administered medications for this visit.     OBJECTIVE: There were no vitals filed for this visit.   There is no height or weight on file to calculate BMI.    ECOG FS:0 - Asymptomatic    LAB RESULTS:  No results found for: NA, K, CL, CO2, GLUCOSE, BUN, CREATININE, CALCIUM, PROT, ALBUMIN, AST, ALT, ALKPHOS, BILITOT, GFRNONAA, GFRAA  Lab Results  Component Value Date   WBC 5.2 06/13/2018   NEUTROABS 3.2 06/13/2018   HGB 13.7 06/13/2018   HCT 41.4 06/13/2018   MCV 96.1 06/13/2018   PLT 180 06/13/2018   Lab Results  Component Value Date   IRON 83 06/13/2018   TIBC 471 (H) 06/13/2018   IRONPCTSAT 18 06/13/2018   Lab Results  Component Value Date   FERRITIN 14 (L) 06/13/2018     STUDIES: No results found.  ASSESSMENT: Iron deficiency anemia  PLAN:   1. Iron deficiency anemia:  Patient had a normal colonoscopy on June 26, 2016.  Patient's hemoglobin continues to trend up and is now within normal limits at 13.7.  His B12 levels are also within normal limits.  Iron stores are mildly decreased, but improved over the last 3 months.  Previously, the remainder of his laboratory work was either negative or within normal limits.  Patient does not require additional IV Feraheme today.  He last received treatment on March 14, 2018.  No intervention is needed at this time.  Return to clinic in 4 months with repeat laboratory work and further evaluation.  2.  B12 deficiency:  Patient's most recent B12 level was within normal limits.  He does not require additional B12 injections at this time.  Repeat laboratory work in 3 months as above.      I discussed the assessment and treatment plan with the patient. The patient was provided an opportunity to ask questions and all were answered. The patient agreed with the plan and demonstrated an understanding of the instructions.   The patient was advised to call back or seek an in-person evaluation if the symptoms worsen or if the condition fails to improve as anticipated.  I provided 15 minutes of non-face-to-face time during this encounter.   Patient expressed understanding and was in agreement with this plan. He also  understands that He can call clinic at any time with any questions, concerns, or complaints.    Marcus Huger, MD   06/15/2018 8:28 AM

## 2018-06-14 ENCOUNTER — Other Ambulatory Visit: Payer: Self-pay

## 2018-06-14 ENCOUNTER — Inpatient Hospital Stay (HOSPITAL_BASED_OUTPATIENT_CLINIC_OR_DEPARTMENT_OTHER): Payer: PPO | Admitting: Oncology

## 2018-06-14 DIAGNOSIS — Z87891 Personal history of nicotine dependence: Secondary | ICD-10-CM | POA: Diagnosis not present

## 2018-06-14 DIAGNOSIS — E538 Deficiency of other specified B group vitamins: Secondary | ICD-10-CM

## 2018-06-14 DIAGNOSIS — I10 Essential (primary) hypertension: Secondary | ICD-10-CM | POA: Diagnosis not present

## 2018-06-14 DIAGNOSIS — Z79899 Other long term (current) drug therapy: Secondary | ICD-10-CM | POA: Diagnosis not present

## 2018-06-14 DIAGNOSIS — D509 Iron deficiency anemia, unspecified: Secondary | ICD-10-CM | POA: Diagnosis not present

## 2018-06-14 NOTE — Progress Notes (Signed)
Patient stated that he had been doing well with no complaints. 

## 2018-06-20 ENCOUNTER — Ambulatory Visit: Payer: PPO | Admitting: Oncology

## 2018-06-20 ENCOUNTER — Other Ambulatory Visit: Payer: PPO

## 2018-06-20 ENCOUNTER — Ambulatory Visit: Payer: PPO

## 2018-07-29 DIAGNOSIS — M9903 Segmental and somatic dysfunction of lumbar region: Secondary | ICD-10-CM | POA: Diagnosis not present

## 2018-07-29 DIAGNOSIS — M955 Acquired deformity of pelvis: Secondary | ICD-10-CM | POA: Diagnosis not present

## 2018-07-29 DIAGNOSIS — M5432 Sciatica, left side: Secondary | ICD-10-CM | POA: Diagnosis not present

## 2018-07-29 DIAGNOSIS — M9905 Segmental and somatic dysfunction of pelvic region: Secondary | ICD-10-CM | POA: Diagnosis not present

## 2018-07-31 DIAGNOSIS — M5432 Sciatica, left side: Secondary | ICD-10-CM | POA: Diagnosis not present

## 2018-07-31 DIAGNOSIS — M9903 Segmental and somatic dysfunction of lumbar region: Secondary | ICD-10-CM | POA: Diagnosis not present

## 2018-07-31 DIAGNOSIS — M9905 Segmental and somatic dysfunction of pelvic region: Secondary | ICD-10-CM | POA: Diagnosis not present

## 2018-07-31 DIAGNOSIS — M955 Acquired deformity of pelvis: Secondary | ICD-10-CM | POA: Diagnosis not present

## 2018-08-02 DIAGNOSIS — M9905 Segmental and somatic dysfunction of pelvic region: Secondary | ICD-10-CM | POA: Diagnosis not present

## 2018-08-02 DIAGNOSIS — M955 Acquired deformity of pelvis: Secondary | ICD-10-CM | POA: Diagnosis not present

## 2018-08-02 DIAGNOSIS — M5432 Sciatica, left side: Secondary | ICD-10-CM | POA: Diagnosis not present

## 2018-08-02 DIAGNOSIS — M9903 Segmental and somatic dysfunction of lumbar region: Secondary | ICD-10-CM | POA: Diagnosis not present

## 2018-08-06 DIAGNOSIS — M9903 Segmental and somatic dysfunction of lumbar region: Secondary | ICD-10-CM | POA: Diagnosis not present

## 2018-08-06 DIAGNOSIS — M9905 Segmental and somatic dysfunction of pelvic region: Secondary | ICD-10-CM | POA: Diagnosis not present

## 2018-08-06 DIAGNOSIS — M5416 Radiculopathy, lumbar region: Secondary | ICD-10-CM | POA: Diagnosis not present

## 2018-08-06 DIAGNOSIS — M5432 Sciatica, left side: Secondary | ICD-10-CM | POA: Diagnosis not present

## 2018-08-06 DIAGNOSIS — M955 Acquired deformity of pelvis: Secondary | ICD-10-CM | POA: Diagnosis not present

## 2018-08-06 DIAGNOSIS — M5136 Other intervertebral disc degeneration, lumbar region: Secondary | ICD-10-CM | POA: Diagnosis not present

## 2018-08-06 DIAGNOSIS — M4306 Spondylolysis, lumbar region: Secondary | ICD-10-CM | POA: Diagnosis not present

## 2018-08-06 DIAGNOSIS — M5441 Lumbago with sciatica, right side: Secondary | ICD-10-CM | POA: Diagnosis not present

## 2018-08-16 DIAGNOSIS — M5136 Other intervertebral disc degeneration, lumbar region: Secondary | ICD-10-CM | POA: Diagnosis not present

## 2018-08-16 DIAGNOSIS — M4306 Spondylolysis, lumbar region: Secondary | ICD-10-CM | POA: Diagnosis not present

## 2018-08-16 DIAGNOSIS — M5416 Radiculopathy, lumbar region: Secondary | ICD-10-CM | POA: Diagnosis not present

## 2018-08-16 DIAGNOSIS — M5441 Lumbago with sciatica, right side: Secondary | ICD-10-CM | POA: Diagnosis not present

## 2018-08-16 DIAGNOSIS — M9903 Segmental and somatic dysfunction of lumbar region: Secondary | ICD-10-CM | POA: Diagnosis not present

## 2018-08-21 DIAGNOSIS — M5441 Lumbago with sciatica, right side: Secondary | ICD-10-CM | POA: Diagnosis not present

## 2018-08-21 DIAGNOSIS — M5136 Other intervertebral disc degeneration, lumbar region: Secondary | ICD-10-CM | POA: Diagnosis not present

## 2018-08-21 DIAGNOSIS — M5416 Radiculopathy, lumbar region: Secondary | ICD-10-CM | POA: Diagnosis not present

## 2018-08-21 DIAGNOSIS — M4306 Spondylolysis, lumbar region: Secondary | ICD-10-CM | POA: Diagnosis not present

## 2018-08-21 DIAGNOSIS — M9903 Segmental and somatic dysfunction of lumbar region: Secondary | ICD-10-CM | POA: Diagnosis not present

## 2018-08-27 DIAGNOSIS — M955 Acquired deformity of pelvis: Secondary | ICD-10-CM | POA: Diagnosis not present

## 2018-08-27 DIAGNOSIS — M9903 Segmental and somatic dysfunction of lumbar region: Secondary | ICD-10-CM | POA: Diagnosis not present

## 2018-08-27 DIAGNOSIS — M9905 Segmental and somatic dysfunction of pelvic region: Secondary | ICD-10-CM | POA: Diagnosis not present

## 2018-08-27 DIAGNOSIS — M5432 Sciatica, left side: Secondary | ICD-10-CM | POA: Diagnosis not present

## 2018-08-28 DIAGNOSIS — M4306 Spondylolysis, lumbar region: Secondary | ICD-10-CM | POA: Diagnosis not present

## 2018-08-28 DIAGNOSIS — M9903 Segmental and somatic dysfunction of lumbar region: Secondary | ICD-10-CM | POA: Diagnosis not present

## 2018-08-28 DIAGNOSIS — M5441 Lumbago with sciatica, right side: Secondary | ICD-10-CM | POA: Diagnosis not present

## 2018-08-28 DIAGNOSIS — M5416 Radiculopathy, lumbar region: Secondary | ICD-10-CM | POA: Diagnosis not present

## 2018-08-28 DIAGNOSIS — M5136 Other intervertebral disc degeneration, lumbar region: Secondary | ICD-10-CM | POA: Diagnosis not present

## 2018-08-29 DIAGNOSIS — M9905 Segmental and somatic dysfunction of pelvic region: Secondary | ICD-10-CM | POA: Diagnosis not present

## 2018-08-29 DIAGNOSIS — M5432 Sciatica, left side: Secondary | ICD-10-CM | POA: Diagnosis not present

## 2018-08-29 DIAGNOSIS — M955 Acquired deformity of pelvis: Secondary | ICD-10-CM | POA: Diagnosis not present

## 2018-08-29 DIAGNOSIS — M9903 Segmental and somatic dysfunction of lumbar region: Secondary | ICD-10-CM | POA: Diagnosis not present

## 2018-09-02 DIAGNOSIS — M5432 Sciatica, left side: Secondary | ICD-10-CM | POA: Diagnosis not present

## 2018-09-02 DIAGNOSIS — M9903 Segmental and somatic dysfunction of lumbar region: Secondary | ICD-10-CM | POA: Diagnosis not present

## 2018-09-02 DIAGNOSIS — M955 Acquired deformity of pelvis: Secondary | ICD-10-CM | POA: Diagnosis not present

## 2018-09-02 DIAGNOSIS — M9905 Segmental and somatic dysfunction of pelvic region: Secondary | ICD-10-CM | POA: Diagnosis not present

## 2018-09-04 DIAGNOSIS — M9905 Segmental and somatic dysfunction of pelvic region: Secondary | ICD-10-CM | POA: Diagnosis not present

## 2018-09-04 DIAGNOSIS — M955 Acquired deformity of pelvis: Secondary | ICD-10-CM | POA: Diagnosis not present

## 2018-09-04 DIAGNOSIS — M9903 Segmental and somatic dysfunction of lumbar region: Secondary | ICD-10-CM | POA: Diagnosis not present

## 2018-09-04 DIAGNOSIS — M5432 Sciatica, left side: Secondary | ICD-10-CM | POA: Diagnosis not present

## 2018-09-09 DIAGNOSIS — M9905 Segmental and somatic dysfunction of pelvic region: Secondary | ICD-10-CM | POA: Diagnosis not present

## 2018-09-09 DIAGNOSIS — M5432 Sciatica, left side: Secondary | ICD-10-CM | POA: Diagnosis not present

## 2018-09-09 DIAGNOSIS — M955 Acquired deformity of pelvis: Secondary | ICD-10-CM | POA: Diagnosis not present

## 2018-09-09 DIAGNOSIS — M9903 Segmental and somatic dysfunction of lumbar region: Secondary | ICD-10-CM | POA: Diagnosis not present

## 2018-09-11 DIAGNOSIS — M9903 Segmental and somatic dysfunction of lumbar region: Secondary | ICD-10-CM | POA: Diagnosis not present

## 2018-09-11 DIAGNOSIS — M955 Acquired deformity of pelvis: Secondary | ICD-10-CM | POA: Diagnosis not present

## 2018-09-11 DIAGNOSIS — M9905 Segmental and somatic dysfunction of pelvic region: Secondary | ICD-10-CM | POA: Diagnosis not present

## 2018-09-11 DIAGNOSIS — M5432 Sciatica, left side: Secondary | ICD-10-CM | POA: Diagnosis not present

## 2018-09-12 DIAGNOSIS — M9903 Segmental and somatic dysfunction of lumbar region: Secondary | ICD-10-CM | POA: Diagnosis not present

## 2018-09-12 DIAGNOSIS — M9905 Segmental and somatic dysfunction of pelvic region: Secondary | ICD-10-CM | POA: Diagnosis not present

## 2018-09-12 DIAGNOSIS — M955 Acquired deformity of pelvis: Secondary | ICD-10-CM | POA: Diagnosis not present

## 2018-09-12 DIAGNOSIS — M5432 Sciatica, left side: Secondary | ICD-10-CM | POA: Diagnosis not present

## 2018-09-16 DIAGNOSIS — M9905 Segmental and somatic dysfunction of pelvic region: Secondary | ICD-10-CM | POA: Diagnosis not present

## 2018-09-16 DIAGNOSIS — M9903 Segmental and somatic dysfunction of lumbar region: Secondary | ICD-10-CM | POA: Diagnosis not present

## 2018-09-16 DIAGNOSIS — M955 Acquired deformity of pelvis: Secondary | ICD-10-CM | POA: Diagnosis not present

## 2018-09-16 DIAGNOSIS — M5432 Sciatica, left side: Secondary | ICD-10-CM | POA: Diagnosis not present

## 2018-09-18 DIAGNOSIS — M5432 Sciatica, left side: Secondary | ICD-10-CM | POA: Diagnosis not present

## 2018-09-18 DIAGNOSIS — M9905 Segmental and somatic dysfunction of pelvic region: Secondary | ICD-10-CM | POA: Diagnosis not present

## 2018-09-18 DIAGNOSIS — M955 Acquired deformity of pelvis: Secondary | ICD-10-CM | POA: Diagnosis not present

## 2018-09-18 DIAGNOSIS — M9903 Segmental and somatic dysfunction of lumbar region: Secondary | ICD-10-CM | POA: Diagnosis not present

## 2018-09-23 DIAGNOSIS — M9905 Segmental and somatic dysfunction of pelvic region: Secondary | ICD-10-CM | POA: Diagnosis not present

## 2018-09-23 DIAGNOSIS — M955 Acquired deformity of pelvis: Secondary | ICD-10-CM | POA: Diagnosis not present

## 2018-09-23 DIAGNOSIS — M9903 Segmental and somatic dysfunction of lumbar region: Secondary | ICD-10-CM | POA: Diagnosis not present

## 2018-09-23 DIAGNOSIS — M5432 Sciatica, left side: Secondary | ICD-10-CM | POA: Diagnosis not present

## 2018-09-30 ENCOUNTER — Ambulatory Visit: Payer: PPO | Admitting: Urology

## 2018-10-13 NOTE — Progress Notes (Signed)
[place]  Telephone:(336) [phone] [phone]) [phone]  ID: Marcus Coleman OB: [birth year]  MR#: [id number]  DGU#:[id number]  Patient Care Team: Idelle Crouch, MD as PCP - General (Internal Medicine)  CHIEF COMPLAINT: Iron deficiency anemia.  INTERVAL HISTORY: Patient returns to clinic today for further evaluation and consideration of additional Feraheme.  He has mild weakness and fatigue, but otherwise feels well. He has no neurologic complaints.  He denies any recent fevers or illnesses.  He has a good appetite and denies weight loss.  He denies any chest pain, shortness of breath, cough, or hemoptysis.  He denies any nausea, vomiting, constipation, or diarrhea.  He has no melena or hematochezia.  He has no urinary complaints.  Patient offers no further specific complaints today.  REVIEW OF SYSTEMS:   Review of Systems  Constitutional: Positive for malaise/fatigue. Negative for fever and weight loss.  Respiratory: Negative.  Negative for cough, hemoptysis and shortness of breath.   Cardiovascular: Negative.  Negative for chest pain and leg swelling.  Gastrointestinal: Negative.  Negative for abdominal pain, blood in stool and melena.  Genitourinary: Negative.  Negative for hematuria.  Musculoskeletal: Negative.  Negative for back pain.  Skin: Negative.  Negative for rash.  Neurological: Positive for weakness. Negative for focal weakness and headaches.  Psychiatric/Behavioral: Negative.  The patient is not nervous/anxious.     As per HPI. Otherwise, a complete review of systems is negative.  PAST MEDICAL HISTORY: Past Medical History:  Diagnosis Date  . Anemia   . Chronic diarrhea   . DDD (degenerative disc disease), cervical   . Hyperlipidemia   . Hypertension    borderline  . IDA (iron deficiency anemia)   . Low testosterone   . Vasovagal syncope     PAST SURGICAL HISTORY: Past Surgical History:  Procedure Laterality Date  . CARDIAC  CATHETERIZATION    . catracts Bilateral   . CHOLECYSTECTOMY    . COLONOSCOPY WITH PROPOFOL N/A 06/26/2016   Procedure: COLONOSCOPY WITH PROPOFOL;  Surgeon: Manya Silvas, MD;  Location: [facility] ENDOSCOPY;  Service: Endoscopy;  Laterality: N/A;  . ENDOSCOPIC RETROGRADE CHOLANGIOPANCREATOGRAPHY (ERCP) WITH PROPOFOL    . eyelid surgery    . FOOT SURGERY    . HEMORROIDECTOMY    . HERNIA REPAIR    . mrercp    . RHINOPLASTY    . TONSILLECTOMY    . WISDOM TOOTH EXTRACTION      FAMILY HISTORY: Family History  Problem Relation Age of Onset  . Hypothyroidism Mother   . Diabetes Mother   . Lung cancer Mother     ADVANCED DIRECTIVES (Y/N):  N  HEALTH MAINTENANCE: Social History   Tobacco Use  . Smoking status: Former Smoker    Packs/day: 1.00    Years: 25.00    Pack years: 25.00    Types: Cigarettes    Quit date: 12/05/1986    Years since quitting: 31.8  . Smokeless tobacco: Never Used  Substance Use Topics  . Alcohol use: Yes    Alcohol/week: 14.0 standard drinks    Types: 14 Cans of beer per week    Comment: 2 beers per day  . Drug use: No     Colonoscopy:  PAP:  Bone density:  Lipid panel:  Allergies  Allergen Reactions  . Nsaids     Current Outpatient Medications  Medication Sig Dispense Refill  . acetaminophen (TYLENOL) 325 MG tablet Take 650 mg by mouth every 6 (six) hours as needed.    Marland Kitchen  cetirizine (ZYRTEC) 10 MG tablet Take 10 mg by mouth daily.    . ferrous sulfate 325 (65 FE) MG tablet Take 325 mg by mouth daily with breakfast.    . Multiple Vitamin (MULTIVITAMIN) tablet Take 1 tablet by mouth daily.    . tamsulosin (FLOMAX) 0.4 MG CAPS capsule Take 1 capsule (0.4 mg total) by mouth daily after breakfast. 90 capsule 3  . vitamin B-12 (CYANOCOBALAMIN) 500 MCG tablet Take 500 mcg by mouth daily.    . vitamin C (ASCORBIC ACID) 500 MG tablet Take 500 mg by mouth daily.     No current facility-administered medications for this visit.     OBJECTIVE: Vitals:    10/15/18 1316 10/15/18 1332  BP: (!) 143/82 132/80  Pulse: 72   Temp: (!) 96.9 F (36.1 C)      Body mass index is 34.54 kg/m.    ECOG FS:0 - Asymptomatic  General: Well-developed, well-nourished, no acute distress. Eyes: Pink conjunctiva, anicteric sclera. HEENT: Normocephalic, moist mucous membranes. Lungs: Clear to auscultation bilaterally. Heart: Regular rate and rhythm. No rubs, murmurs, or gallops. Abdomen: Soft, nontender, nondistended. No organomegaly noted, normoactive bowel sounds. Musculoskeletal: No edema, cyanosis, or clubbing. Neuro: Alert, answering all questions appropriately. Cranial nerves grossly intact. Skin: No rashes or petechiae noted. Psych: Normal affect.  LAB RESULTS:  No results found for: NA, K, CL, CO2, GLUCOSE, BUN, CREATININE, CALCIUM, PROT, ALBUMIN, AST, ALT, ALKPHOS, BILITOT, GFRNONAA, GFRAA  Lab Results  Component Value Date   WBC 6.3 10/15/2018   NEUTROABS 4.0 10/15/2018   HGB 8.0 (L) 10/15/2018   HCT 27.2 (L) 10/15/2018   MCV 77.9 (L) 10/15/2018   PLT 246 10/15/2018   Lab Results  Component Value Date   IRON 13 (L) 10/15/2018   TIBC 497 (H) 10/15/2018   IRONPCTSAT 3 (L) 10/15/2018   Lab Results  Component Value Date   FERRITIN 3 (L) 10/15/2018     STUDIES: No results found.  ASSESSMENT: Iron deficiency anemia  PLAN:   1. Iron deficiency anemia:  Patient had a normal colonoscopy on June 26, 2016.  Patient's hemoglobin and iron stores have significantly dropped and he is mildly symptomatic.  Previously, the remainder of his laboratory work was either negative or within normal limits.  Proceed with 510 mg IV Feraheme today.  Return to clinic in 1 week for second infusion and then in 3 months with repeat laboratory work, further evaluation, and consideration of additional treatment.  2.  B12 deficiency: Patient's B12 level continues to be within normal limits.  He does not require additional B12 injections at this time.  Repeat  laboratory work in 3 months as above.    I spent a total of 30 minutes face-to-face with the patient of which greater than 50% of the visit was spent in counseling and coordination of care as detailed above.   Patient expressed understanding and was in agreement with this plan. He also understands that He can call clinic at any time with any questions, concerns, or complaints.    Lloyd Huger, MD   10/16/2018 7:29 AM

## 2018-10-14 ENCOUNTER — Other Ambulatory Visit: Payer: Self-pay

## 2018-10-14 DIAGNOSIS — H40003 Preglaucoma, unspecified, bilateral: Secondary | ICD-10-CM | POA: Diagnosis not present

## 2018-10-15 ENCOUNTER — Inpatient Hospital Stay: Payer: PPO | Attending: Oncology

## 2018-10-15 ENCOUNTER — Other Ambulatory Visit: Payer: Self-pay

## 2018-10-15 ENCOUNTER — Inpatient Hospital Stay: Payer: PPO

## 2018-10-15 ENCOUNTER — Encounter: Payer: Self-pay | Admitting: Oncology

## 2018-10-15 ENCOUNTER — Inpatient Hospital Stay (HOSPITAL_BASED_OUTPATIENT_CLINIC_OR_DEPARTMENT_OTHER): Payer: PPO | Admitting: Oncology

## 2018-10-15 VITALS — BP 132/80 | HR 72 | Temp 96.9°F | Wt 214.0 lb

## 2018-10-15 VITALS — BP 123/78 | HR 82

## 2018-10-15 DIAGNOSIS — D509 Iron deficiency anemia, unspecified: Secondary | ICD-10-CM | POA: Diagnosis not present

## 2018-10-15 DIAGNOSIS — E538 Deficiency of other specified B group vitamins: Secondary | ICD-10-CM | POA: Diagnosis not present

## 2018-10-15 DIAGNOSIS — D649 Anemia, unspecified: Secondary | ICD-10-CM

## 2018-10-15 DIAGNOSIS — Z79899 Other long term (current) drug therapy: Secondary | ICD-10-CM | POA: Diagnosis not present

## 2018-10-15 LAB — CBC WITH DIFFERENTIAL/PLATELET
Abs Immature Granulocytes: 0.03 10*3/uL (ref 0.00–0.07)
Basophils Absolute: 0 10*3/uL (ref 0.0–0.1)
Basophils Relative: 0 %
Eosinophils Absolute: 0.2 10*3/uL (ref 0.0–0.5)
Eosinophils Relative: 4 %
HCT: 27.2 % — ABNORMAL LOW (ref 39.0–52.0)
Hemoglobin: 8 g/dL — ABNORMAL LOW (ref 13.0–17.0)
Immature Granulocytes: 1 %
Lymphocytes Relative: 20 %
Lymphs Abs: 1.3 10*3/uL (ref 0.7–4.0)
MCH: 22.9 pg — ABNORMAL LOW (ref 26.0–34.0)
MCHC: 29.4 g/dL — ABNORMAL LOW (ref 30.0–36.0)
MCV: 77.9 fL — ABNORMAL LOW (ref 80.0–100.0)
Monocytes Absolute: 0.8 10*3/uL (ref 0.1–1.0)
Monocytes Relative: 12 %
Neutro Abs: 4 10*3/uL (ref 1.7–7.7)
Neutrophils Relative %: 63 %
Platelets: 246 10*3/uL (ref 150–400)
RBC: 3.49 MIL/uL — ABNORMAL LOW (ref 4.22–5.81)
RDW: 17.3 % — ABNORMAL HIGH (ref 11.5–15.5)
WBC: 6.3 10*3/uL (ref 4.0–10.5)
nRBC: 0 % (ref 0.0–0.2)

## 2018-10-15 LAB — FERRITIN: Ferritin: 3 ng/mL — ABNORMAL LOW (ref 24–336)

## 2018-10-15 LAB — IRON AND TIBC
Iron: 13 ug/dL — ABNORMAL LOW (ref 45–182)
Saturation Ratios: 3 % — ABNORMAL LOW (ref 17.9–39.5)
TIBC: 497 ug/dL — ABNORMAL HIGH (ref 250–450)
UIBC: 484 ug/dL

## 2018-10-15 LAB — VITAMIN B12: Vitamin B-12: 209 pg/mL (ref 180–914)

## 2018-10-15 MED ORDER — SODIUM CHLORIDE 0.9 % IV SOLN
Freq: Once | INTRAVENOUS | Status: AC
  Administered 2018-10-15: 14:00:00 via INTRAVENOUS
  Filled 2018-10-15: qty 250

## 2018-10-15 MED ORDER — SODIUM CHLORIDE 0.9 % IV SOLN
510.0000 mg | Freq: Once | INTRAVENOUS | Status: AC
  Administered 2018-10-15: 510 mg via INTRAVENOUS
  Filled 2018-10-15: qty 17

## 2018-10-15 MED ORDER — CYANOCOBALAMIN 1000 MCG/ML IJ SOLN
1000.0000 ug | Freq: Once | INTRAMUSCULAR | Status: AC
  Administered 2018-10-15: 1000 ug via INTRAMUSCULAR
  Filled 2018-10-15: qty 1

## 2018-10-15 NOTE — Progress Notes (Signed)
Patient stated that he had been doing well. 

## 2018-10-18 DIAGNOSIS — M9905 Segmental and somatic dysfunction of pelvic region: Secondary | ICD-10-CM | POA: Diagnosis not present

## 2018-10-18 DIAGNOSIS — M955 Acquired deformity of pelvis: Secondary | ICD-10-CM | POA: Diagnosis not present

## 2018-10-18 DIAGNOSIS — M9903 Segmental and somatic dysfunction of lumbar region: Secondary | ICD-10-CM | POA: Diagnosis not present

## 2018-10-18 DIAGNOSIS — M5432 Sciatica, left side: Secondary | ICD-10-CM | POA: Diagnosis not present

## 2018-10-21 DIAGNOSIS — M9905 Segmental and somatic dysfunction of pelvic region: Secondary | ICD-10-CM | POA: Diagnosis not present

## 2018-10-21 DIAGNOSIS — M9903 Segmental and somatic dysfunction of lumbar region: Secondary | ICD-10-CM | POA: Diagnosis not present

## 2018-10-21 DIAGNOSIS — M5432 Sciatica, left side: Secondary | ICD-10-CM | POA: Diagnosis not present

## 2018-10-21 DIAGNOSIS — H401111 Primary open-angle glaucoma, right eye, mild stage: Secondary | ICD-10-CM | POA: Diagnosis not present

## 2018-10-21 DIAGNOSIS — M955 Acquired deformity of pelvis: Secondary | ICD-10-CM | POA: Diagnosis not present

## 2018-10-22 ENCOUNTER — Other Ambulatory Visit: Payer: Self-pay

## 2018-10-22 ENCOUNTER — Inpatient Hospital Stay: Payer: PPO

## 2018-10-22 VITALS — BP 130/74 | HR 73 | Temp 97.3°F | Resp 18

## 2018-10-22 DIAGNOSIS — D509 Iron deficiency anemia, unspecified: Secondary | ICD-10-CM | POA: Diagnosis not present

## 2018-10-22 MED ORDER — SODIUM CHLORIDE 0.9 % IV SOLN
Freq: Once | INTRAVENOUS | Status: AC
  Administered 2018-10-22: 11:00:00 via INTRAVENOUS
  Filled 2018-10-22: qty 250

## 2018-10-22 MED ORDER — SODIUM CHLORIDE 0.9 % IV SOLN
510.0000 mg | Freq: Once | INTRAVENOUS | Status: AC
  Administered 2018-10-22: 510 mg via INTRAVENOUS
  Filled 2018-10-22: qty 17

## 2018-10-22 NOTE — Progress Notes (Signed)
1156:Pt declines to stay for full 30 minute post infusion observation. Pt tolerated infusion well. Pt and VS stable at discharge.

## 2018-10-24 ENCOUNTER — Encounter: Payer: Self-pay | Admitting: Oncology

## 2018-11-01 ENCOUNTER — Telehealth: Payer: Self-pay | Admitting: *Deleted

## 2018-11-01 NOTE — Telephone Encounter (Signed)
We do not have a good idea why his hemoglobin keeps dropping.  He had a normal colonoscopy less than 2 years ago. Nothing more to do from her standpoint.  The plan is as dictated in my note and in his follow-up where he will receive IV Feraheme twice and then recheck in 3 months.  There is no benefit to checking him sooner.

## 2018-11-01 NOTE — Telephone Encounter (Signed)
Wife called stating patient was to ask questions at his appointment and did not. She wants to know why his Hgb is dropping and what she can do to help. She also asks what the plan is regarding checking his Hgb, doe she have to wait until November for recheck and states she is very concerned about him. Please advise or return call to wife Lelon Frohlich [phone] or [phone]

## 2018-11-01 NOTE — Telephone Encounter (Addendum)
Left message to return call 

## 2018-11-01 NOTE — Telephone Encounter (Signed)
I have called Marcus Coleman and explained to her what Dr Grayland Ormond said

## 2018-11-05 ENCOUNTER — Other Ambulatory Visit: Payer: Self-pay

## 2018-11-05 ENCOUNTER — Encounter: Payer: Self-pay | Admitting: Urology

## 2018-11-05 ENCOUNTER — Ambulatory Visit: Payer: PPO | Admitting: Urology

## 2018-11-05 VITALS — BP 140/80 | HR 76 | Ht 66.0 in | Wt 210.0 lb

## 2018-11-05 DIAGNOSIS — N401 Enlarged prostate with lower urinary tract symptoms: Secondary | ICD-10-CM | POA: Diagnosis not present

## 2018-11-05 NOTE — Progress Notes (Signed)
11/05/2018 10:41 AM   Marcus Coleman 01-18-1941 [id number]  Referring provider: Idelle Crouch, MD [place] [facility] [place],  [place] [address]  Chief Complaint  Patient presents with  . Follow-up    HPI: 78 y.o. male presents for annual follow-up of BPH with lower urinary tract symptoms.  Overall he has stable voiding symptoms.  He does not like to take medications and only takes tamsulosin intermittently.  When he has not taken the medication he notes urgency in the morning with rare episodes of urge incontinence.  He has nocturia x0-1.  Denies dysuria or gross hematuria.  Denies flank, abdominal, pelvic or scrotal pain.  He had a PSA performed with his PCP March 2020 which was 0.18.   PMH: Past Medical History:  Diagnosis Date  . Anemia   . Chronic diarrhea   . DDD (degenerative disc disease), cervical   . Hyperlipidemia   . Hypertension    borderline  . IDA (iron deficiency anemia)   . Low testosterone   . Vasovagal syncope     Surgical History: Past Surgical History:  Procedure Laterality Date  . CARDIAC CATHETERIZATION    . catracts Bilateral   . CHOLECYSTECTOMY    . COLONOSCOPY WITH PROPOFOL N/A 06/26/2016   Procedure: COLONOSCOPY WITH PROPOFOL;  Surgeon: Manya Silvas, MD;  Location: [facility] ENDOSCOPY;  Service: Endoscopy;  Laterality: N/A;  . ENDOSCOPIC RETROGRADE CHOLANGIOPANCREATOGRAPHY (ERCP) WITH PROPOFOL    . eyelid surgery    . FOOT SURGERY    . HEMORROIDECTOMY    . HERNIA REPAIR    . mrercp    . RHINOPLASTY    . TONSILLECTOMY    . WISDOM TOOTH EXTRACTION      Home Medications:  Allergies as of 11/05/2018      Reactions   Nsaids       Medication List       Accurate as of November 05, 2018 10:41 AM. If you have any questions, ask your nurse or doctor.        acetaminophen 325 MG tablet Commonly known as: TYLENOL Take 650 mg by mouth every 6 (six) hours as needed.   cetirizine 10 MG tablet Commonly known as:  ZYRTEC Take 10 mg by mouth daily.   ferrous sulfate 325 (65 FE) MG tablet Take 325 mg by mouth daily with breakfast.   latanoprost 0.005 % ophthalmic solution Commonly known as: XALATAN Place 1 drop into both eyes at bedtime.   multivitamin tablet Take 1 tablet by mouth daily.   tamsulosin 0.4 MG Caps capsule Commonly known as: FLOMAX Take 1 capsule (0.4 mg total) by mouth daily after breakfast.   vitamin B-12 500 MCG tablet Commonly known as: CYANOCOBALAMIN Take 500 mcg by mouth daily.   vitamin C 500 MG tablet Commonly known as: ASCORBIC ACID Take 500 mg by mouth daily.       Allergies:  Allergies  Allergen Reactions  . Nsaids     Family History: Family History  Problem Relation Age of Onset  . Hypothyroidism Mother   . Diabetes Mother   . Lung cancer Mother     Social History:  reports that he quit smoking about 31 years ago. His smoking use included cigarettes. He has a 25.00 pack-year smoking history. He has never used smokeless tobacco. He reports current alcohol use of about 14.0 standard drinks of alcohol per week. He reports that he does not use drugs.  ROS: UROLOGY Frequent Urination?: No Hard  to postpone urination?: No Burning/pain with urination?: No Get up at night to urinate?: Yes Leakage of urine?: Yes Urine stream starts and stops?: No Trouble starting stream?: No Do you have to strain to urinate?: No Blood in urine?: No Urinary tract infection?: No Sexually transmitted disease?: No Injury to kidneys or bladder?: No Painful intercourse?: No Weak stream?: No Erection problems?: No Penile pain?: No  Gastrointestinal Nausea?: No Vomiting?: No Indigestion/heartburn?: No Diarrhea?: No Constipation?: No  Constitutional Fever: No Night sweats?: No Weight loss?: No Fatigue?: No  Skin Skin rash/lesions?: No Itching?: No  Eyes Blurred vision?: No Double vision?: No  Ears/Nose/Throat Sore throat?: No Sinus problems?: No   Hematologic/Lymphatic Swollen glands?: No Easy bruising?: No  Cardiovascular Leg swelling?: No Chest pain?: No  Respiratory Cough?: No Shortness of breath?: No  Endocrine Excessive thirst?: No  Musculoskeletal Back pain?: No Joint pain?: No  Neurological Headaches?: No Dizziness?: No  Psychologic Depression?: No Anxiety?: No  Physical Exam: BP 140/80   Pulse 76   Ht 5\' 6"  (1.676 m)   Wt 210 lb (95.3 kg)   BMI 33.89 kg/m   Constitutional:  Alert and oriented, No acute distress. HEENT: [place] AT, moist mucus membranes.  Trachea midline, no masses. Cardiovascular: No clubbing, cyanosis, or edema. Respiratory: Normal respiratory effort, no increased work of breathing. GI: Abdomen is soft, nontender, nondistended, no abdominal masses GU: DRE declined Skin: No rashes, bruises or suspicious lesions. Neurologic: Grossly intact, no focal deficits, moving all 4 extremities. Psychiatric: Normal mood and affect.   Assessment & Plan:    - BPH with lower urinary tract symptoms Stable voiding symptoms off tamsulosin which he takes periodically.  He did request a refill which was sent to pharmacy.  Continue annual follow-up.   Abbie Sons, [place] [address], [place],  [address] [phone]

## 2018-11-06 DIAGNOSIS — N401 Enlarged prostate with lower urinary tract symptoms: Secondary | ICD-10-CM | POA: Insufficient documentation

## 2018-11-06 MED ORDER — TAMSULOSIN HCL 0.4 MG PO CAPS: 0.4000 mg | ORAL_CAPSULE | Freq: Every day | ORAL | 3 refills | Status: DC

## 2018-11-11 DIAGNOSIS — M955 Acquired deformity of pelvis: Secondary | ICD-10-CM | POA: Diagnosis not present

## 2018-11-11 DIAGNOSIS — M9903 Segmental and somatic dysfunction of lumbar region: Secondary | ICD-10-CM | POA: Diagnosis not present

## 2018-11-11 DIAGNOSIS — M5432 Sciatica, left side: Secondary | ICD-10-CM | POA: Diagnosis not present

## 2018-11-11 DIAGNOSIS — M9905 Segmental and somatic dysfunction of pelvic region: Secondary | ICD-10-CM | POA: Diagnosis not present

## 2018-11-19 DIAGNOSIS — M9903 Segmental and somatic dysfunction of lumbar region: Secondary | ICD-10-CM | POA: Diagnosis not present

## 2018-11-19 DIAGNOSIS — M5432 Sciatica, left side: Secondary | ICD-10-CM | POA: Diagnosis not present

## 2018-11-19 DIAGNOSIS — M955 Acquired deformity of pelvis: Secondary | ICD-10-CM | POA: Diagnosis not present

## 2018-11-19 DIAGNOSIS — M9905 Segmental and somatic dysfunction of pelvic region: Secondary | ICD-10-CM | POA: Diagnosis not present

## 2018-11-21 DIAGNOSIS — R011 Cardiac murmur, unspecified: Secondary | ICD-10-CM | POA: Diagnosis not present

## 2018-11-21 DIAGNOSIS — Z79899 Other long term (current) drug therapy: Secondary | ICD-10-CM | POA: Diagnosis not present

## 2018-11-21 DIAGNOSIS — M5416 Radiculopathy, lumbar region: Secondary | ICD-10-CM | POA: Diagnosis not present

## 2018-11-21 DIAGNOSIS — D509 Iron deficiency anemia, unspecified: Secondary | ICD-10-CM | POA: Diagnosis not present

## 2018-11-21 DIAGNOSIS — H401111 Primary open-angle glaucoma, right eye, mild stage: Secondary | ICD-10-CM | POA: Diagnosis not present

## 2018-12-02 DIAGNOSIS — R011 Cardiac murmur, unspecified: Secondary | ICD-10-CM | POA: Diagnosis not present

## 2018-12-09 DIAGNOSIS — M5432 Sciatica, left side: Secondary | ICD-10-CM | POA: Diagnosis not present

## 2018-12-09 DIAGNOSIS — M955 Acquired deformity of pelvis: Secondary | ICD-10-CM | POA: Diagnosis not present

## 2018-12-09 DIAGNOSIS — M9905 Segmental and somatic dysfunction of pelvic region: Secondary | ICD-10-CM | POA: Diagnosis not present

## 2018-12-09 DIAGNOSIS — M9903 Segmental and somatic dysfunction of lumbar region: Secondary | ICD-10-CM | POA: Diagnosis not present

## 2018-12-10 DIAGNOSIS — I340 Nonrheumatic mitral (valve) insufficiency: Secondary | ICD-10-CM | POA: Insufficient documentation

## 2018-12-10 DIAGNOSIS — R030 Elevated blood-pressure reading, without diagnosis of hypertension: Secondary | ICD-10-CM | POA: Diagnosis not present

## 2018-12-23 DIAGNOSIS — H401111 Primary open-angle glaucoma, right eye, mild stage: Secondary | ICD-10-CM | POA: Diagnosis not present

## 2018-12-30 DIAGNOSIS — M5432 Sciatica, left side: Secondary | ICD-10-CM | POA: Diagnosis not present

## 2018-12-30 DIAGNOSIS — M9905 Segmental and somatic dysfunction of pelvic region: Secondary | ICD-10-CM | POA: Diagnosis not present

## 2018-12-30 DIAGNOSIS — M955 Acquired deformity of pelvis: Secondary | ICD-10-CM | POA: Diagnosis not present

## 2018-12-30 DIAGNOSIS — M9903 Segmental and somatic dysfunction of lumbar region: Secondary | ICD-10-CM | POA: Diagnosis not present

## 2019-01-12 NOTE — Progress Notes (Signed)
[place]  Telephone:(336) [phone] Fax:(336) [phone]  ID: Marcus Coleman OB: [birth year]  MR#: [id number]  [id number]  Patient Care Team: Idelle Crouch, MD as PCP - General (Internal Medicine)  CHIEF COMPLAINT: Iron deficiency anemia.  INTERVAL HISTORY: Patient returns to clinic today for repeat laboratory work, further evaluation, consideration of additional Feraheme.  He continues to have chronic weakness and fatigue, but admits this has improved since last receiving IV Feraheme approximately 3 months ago.  He otherwise feels well.  He has no neurologic complaints.  He denies any recent fevers or illnesses.  He has a good appetite and denies weight loss.  He denies any chest pain, shortness of breath, cough, or hemoptysis.  He denies any nausea, vomiting, constipation, or diarrhea.  He has no melena or hematochezia.  He has no urinary complaints.  Patient feels at his baseline offers no further specific complaints today.  REVIEW OF SYSTEMS:   Review of Systems  Constitutional: Positive for malaise/fatigue. Negative for fever and weight loss.  Respiratory: Negative.  Negative for cough, hemoptysis and shortness of breath.   Cardiovascular: Negative.  Negative for chest pain and leg swelling.  Gastrointestinal: Negative.  Negative for abdominal pain, blood in stool and melena.  Genitourinary: Negative.  Negative for hematuria.  Musculoskeletal: Negative.  Negative for back pain.  Skin: Negative.  Negative for rash.  Neurological: Positive for weakness. Negative for focal weakness and headaches.  Psychiatric/Behavioral: Negative.  The patient is not nervous/anxious.     As per HPI. Otherwise, a complete review of systems is negative.  PAST MEDICAL HISTORY: Past Medical History:  Diagnosis Date  . Anemia   . Chronic diarrhea   . DDD (degenerative disc disease), cervical   . Hyperlipidemia   . Hypertension    borderline  . IDA (iron deficiency anemia)    . Low testosterone   . Vasovagal syncope     PAST SURGICAL HISTORY: Past Surgical History:  Procedure Laterality Date  . CARDIAC CATHETERIZATION    . catracts Bilateral   . CHOLECYSTECTOMY    . COLONOSCOPY WITH PROPOFOL N/A 06/26/2016   Procedure: COLONOSCOPY WITH PROPOFOL;  Surgeon: Manya Silvas, MD;  Location: [facility] ENDOSCOPY;  Service: Endoscopy;  Laterality: N/A;  . ENDOSCOPIC RETROGRADE CHOLANGIOPANCREATOGRAPHY (ERCP) WITH PROPOFOL    . eyelid surgery    . FOOT SURGERY    . HEMORROIDECTOMY    . HERNIA REPAIR    . mrercp    . RHINOPLASTY    . TONSILLECTOMY    . WISDOM TOOTH EXTRACTION      FAMILY HISTORY: Family History  Problem Relation Age of Onset  . Hypothyroidism Mother   . Diabetes Mother   . Lung cancer Mother     ADVANCED DIRECTIVES (Y/N):  N  HEALTH MAINTENANCE: Social History   Tobacco Use  . Smoking status: Former Smoker    Packs/day: 1.00    Years: 25.00    Pack years: 25.00    Types: Cigarettes    Quit date: 12/05/1986    Years since quitting: 32.1  . Smokeless tobacco: Never Used  Substance Use Topics  . Alcohol use: Yes    Alcohol/week: 14.0 standard drinks    Types: 14 Cans of beer per week    Comment: 2 beers per day  . Drug use: No     Colonoscopy:  PAP:  Bone density:  Lipid panel:  Allergies  Allergen Reactions  . Nsaids     Current Outpatient Medications  Medication Sig Dispense Refill  . acetaminophen (TYLENOL) 325 MG tablet Take 650 mg by mouth every 6 (six) hours as needed.    . cetirizine (ZYRTEC) 10 MG tablet Take 10 mg by mouth daily.    . ferrous sulfate 325 (65 FE) MG tablet Take 325 mg by mouth daily with breakfast.    . Multiple Vitamin (MULTIVITAMIN) tablet Take 1 tablet by mouth daily.    . tamsulosin (FLOMAX) 0.4 MG CAPS capsule Take 1 capsule (0.4 mg total) by mouth daily after breakfast. 90 capsule 3  . timolol (TIMOPTIC) 0.5 % ophthalmic solution Place 1 drop into both eyes daily.     . vitamin B-12  (CYANOCOBALAMIN) 500 MCG tablet Take 500 mcg by mouth daily.    . vitamin C (ASCORBIC ACID) 500 MG tablet Take 500 mg by mouth daily.     No current facility-administered medications for this visit.     OBJECTIVE: Vitals:   01/16/19 1423  BP: (!) 162/76  Pulse: 68  Resp: 18  Temp: (!) 96 F (35.6 C)  SpO2: 96%     Body mass index is 33.99 kg/m.    ECOG FS:0 - Asymptomatic  General: Well-developed, well-nourished, no acute distress. Eyes: Pink conjunctiva, anicteric sclera. HEENT: Normocephalic, moist mucous membranes. Lungs: Clear to auscultation bilaterally. Heart: Regular rate and rhythm. No rubs, murmurs, or gallops. Abdomen: Soft, nontender, nondistended. No organomegaly noted, normoactive bowel sounds. Musculoskeletal: No edema, cyanosis, or clubbing. Neuro: Alert, answering all questions appropriately. Cranial nerves grossly intact. Skin: No rashes or petechiae noted. Psych: Normal affect.  LAB RESULTS:  No results found for: NA, K, CL, CO2, GLUCOSE, BUN, CREATININE, CALCIUM, PROT, ALBUMIN, AST, ALT, ALKPHOS, BILITOT, GFRNONAA, GFRAA  Lab Results  Component Value Date   WBC 5.7 01/16/2019   NEUTROABS 3.4 01/16/2019   HGB 11.5 (L) 01/16/2019   HCT 36.1 (L) 01/16/2019   MCV 90.0 01/16/2019   PLT 164 01/16/2019   Lab Results  Component Value Date   IRON 36 (L) 01/16/2019   TIBC 413 01/16/2019   IRONPCTSAT 9 (L) 01/16/2019   Lab Results  Component Value Date   FERRITIN 7 (L) 01/16/2019     STUDIES: No results found.  ASSESSMENT: Iron deficiency anemia  PLAN:   1. Iron deficiency anemia:  Patient had a normal colonoscopy on June 26, 2016.  Patient's hemoglobin has significantly improved to 11.5, although his iron stores remain decreased. Previously, the remainder of his laboratory work was either negative or within normal limits.  He does not require additional IV Feraheme today, but will likely need treatment at his next clinic visit.  Return to clinic  in 3 months with repeat laboratory work, further evaluation, and continuation of treatment.  2.  B12 deficiency: Resolved.  I spent a total of 15 minutes face-to-face with the patient of which greater than 50% of the visit was spent in counseling and coordination of care as detailed above.    Patient expressed understanding and was in agreement with this plan. He also understands that He can call clinic at any time with any questions, concerns, or complaints.    Lloyd Huger, MD   01/16/2019 6:05 PM

## 2019-01-15 ENCOUNTER — Other Ambulatory Visit: Payer: Self-pay | Admitting: Oncology

## 2019-01-15 NOTE — Progress Notes (Signed)
Patient is coming in for follow up he is doing well no complaints  

## 2019-01-16 ENCOUNTER — Other Ambulatory Visit: Payer: Self-pay

## 2019-01-16 ENCOUNTER — Inpatient Hospital Stay: Payer: PPO

## 2019-01-16 ENCOUNTER — Inpatient Hospital Stay (HOSPITAL_BASED_OUTPATIENT_CLINIC_OR_DEPARTMENT_OTHER): Payer: PPO | Admitting: Oncology

## 2019-01-16 ENCOUNTER — Inpatient Hospital Stay: Payer: PPO | Attending: Oncology

## 2019-01-16 VITALS — BP 162/76 | HR 68 | Temp 96.0°F | Resp 18 | Wt 210.6 lb

## 2019-01-16 DIAGNOSIS — D509 Iron deficiency anemia, unspecified: Secondary | ICD-10-CM

## 2019-01-16 DIAGNOSIS — F17210 Nicotine dependence, cigarettes, uncomplicated: Secondary | ICD-10-CM | POA: Diagnosis not present

## 2019-01-16 DIAGNOSIS — D649 Anemia, unspecified: Secondary | ICD-10-CM

## 2019-01-16 LAB — CBC WITH DIFFERENTIAL/PLATELET
Abs Immature Granulocytes: 0.01 10*3/uL (ref 0.00–0.07)
Basophils Absolute: 0 10*3/uL (ref 0.0–0.1)
Basophils Relative: 0 %
Eosinophils Absolute: 0.2 10*3/uL (ref 0.0–0.5)
Eosinophils Relative: 4 %
HCT: 36.1 % — ABNORMAL LOW (ref 39.0–52.0)
Hemoglobin: 11.5 g/dL — ABNORMAL LOW (ref 13.0–17.0)
Immature Granulocytes: 0 %
Lymphocytes Relative: 24 %
Lymphs Abs: 1.4 10*3/uL (ref 0.7–4.0)
MCH: 28.7 pg (ref 26.0–34.0)
MCHC: 31.9 g/dL (ref 30.0–36.0)
MCV: 90 fL (ref 80.0–100.0)
Monocytes Absolute: 0.6 10*3/uL (ref 0.1–1.0)
Monocytes Relative: 11 %
Neutro Abs: 3.4 10*3/uL (ref 1.7–7.7)
Neutrophils Relative %: 61 %
Platelets: 164 10*3/uL (ref 150–400)
RBC: 4.01 MIL/uL — ABNORMAL LOW (ref 4.22–5.81)
RDW: 16.3 % — ABNORMAL HIGH (ref 11.5–15.5)
WBC: 5.7 10*3/uL (ref 4.0–10.5)
nRBC: 0 % (ref 0.0–0.2)

## 2019-01-16 LAB — FERRITIN: Ferritin: 7 ng/mL — ABNORMAL LOW (ref 24–336)

## 2019-01-16 LAB — VITAMIN B12: Vitamin B-12: 307 pg/mL (ref 180–914)

## 2019-01-16 LAB — IRON AND TIBC
Iron: 36 ug/dL — ABNORMAL LOW (ref 45–182)
Saturation Ratios: 9 % — ABNORMAL LOW (ref 17.9–39.5)
TIBC: 413 ug/dL (ref 250–450)
UIBC: 377 ug/dL

## 2019-01-20 DIAGNOSIS — M955 Acquired deformity of pelvis: Secondary | ICD-10-CM | POA: Diagnosis not present

## 2019-01-20 DIAGNOSIS — M9903 Segmental and somatic dysfunction of lumbar region: Secondary | ICD-10-CM | POA: Diagnosis not present

## 2019-01-20 DIAGNOSIS — M9905 Segmental and somatic dysfunction of pelvic region: Secondary | ICD-10-CM | POA: Diagnosis not present

## 2019-01-20 DIAGNOSIS — M5432 Sciatica, left side: Secondary | ICD-10-CM | POA: Diagnosis not present

## 2019-02-17 DIAGNOSIS — M9903 Segmental and somatic dysfunction of lumbar region: Secondary | ICD-10-CM | POA: Diagnosis not present

## 2019-02-17 DIAGNOSIS — M9905 Segmental and somatic dysfunction of pelvic region: Secondary | ICD-10-CM | POA: Diagnosis not present

## 2019-02-17 DIAGNOSIS — M5432 Sciatica, left side: Secondary | ICD-10-CM | POA: Diagnosis not present

## 2019-02-17 DIAGNOSIS — M955 Acquired deformity of pelvis: Secondary | ICD-10-CM | POA: Diagnosis not present

## 2019-03-10 DIAGNOSIS — M5432 Sciatica, left side: Secondary | ICD-10-CM | POA: Diagnosis not present

## 2019-03-10 DIAGNOSIS — M955 Acquired deformity of pelvis: Secondary | ICD-10-CM | POA: Diagnosis not present

## 2019-03-10 DIAGNOSIS — M9903 Segmental and somatic dysfunction of lumbar region: Secondary | ICD-10-CM | POA: Diagnosis not present

## 2019-03-10 DIAGNOSIS — M9905 Segmental and somatic dysfunction of pelvic region: Secondary | ICD-10-CM | POA: Diagnosis not present

## 2019-03-14 DIAGNOSIS — I82409 Acute embolism and thrombosis of unspecified deep veins of unspecified lower extremity: Secondary | ICD-10-CM

## 2019-03-14 HISTORY — DX: Acute embolism and thrombosis of unspecified deep veins of unspecified lower extremity: I82.409

## 2019-03-28 ENCOUNTER — Other Ambulatory Visit: Payer: Self-pay

## 2019-03-28 ENCOUNTER — Encounter (INDEPENDENT_AMBULATORY_CARE_PROVIDER_SITE_OTHER): Payer: Self-pay | Admitting: Vascular Surgery

## 2019-03-28 ENCOUNTER — Ambulatory Visit (INDEPENDENT_AMBULATORY_CARE_PROVIDER_SITE_OTHER): Payer: PPO | Admitting: Vascular Surgery

## 2019-03-28 VITALS — BP 136/78 | HR 78 | Resp 16 | Wt 211.0 lb

## 2019-03-28 DIAGNOSIS — M5416 Radiculopathy, lumbar region: Secondary | ICD-10-CM | POA: Diagnosis not present

## 2019-03-28 DIAGNOSIS — R030 Elevated blood-pressure reading, without diagnosis of hypertension: Secondary | ICD-10-CM

## 2019-03-28 DIAGNOSIS — M79609 Pain in unspecified limb: Secondary | ICD-10-CM | POA: Insufficient documentation

## 2019-03-28 DIAGNOSIS — M79604 Pain in right leg: Secondary | ICD-10-CM | POA: Diagnosis not present

## 2019-03-28 NOTE — Progress Notes (Signed)
Patient ID: Marcus Coleman, male   DOB: Nov 10, 1940, 79 y.o.   MRN: [id number]  Chief Complaint  Patient presents with  . New Patient (Initial Visit)    ref [facility] PVD    HPI Marcus Coleman is a 79 y.o. male.  I am asked to see the patient by Dr. Doy Hutching for evaluation of leg pain and swelling.  She was seen in our office 3 to 4 years ago and had been doing reasonably well.  He admits he was not wearing his compression stockings as much as he should have been.  Couple of weeks ago he developed noticeable swelling in the leg and this was followed by pain in the right posterior calf and knee area.  This was a firm cordlike area that extended up to the knee.  The swelling has gotten better some now that he is wearing compression stockings.  No fevers or chills.  No chest pains or shortness of breath.  There is no clear cause or inciting event that started the symptoms. No left leg symptoms.     Past Medical History:  Diagnosis Date  . Anemia   . Chronic diarrhea   . DDD (degenerative disc disease), cervical   . Hyperlipidemia   . Hypertension    borderline  . IDA (iron deficiency anemia)   . Low testosterone   . Vasovagal syncope     Past Surgical History:  Procedure Laterality Date  . CARDIAC CATHETERIZATION    . catracts Bilateral   . CHOLECYSTECTOMY    . COLONOSCOPY WITH PROPOFOL N/A 06/26/2016   Procedure: COLONOSCOPY WITH PROPOFOL;  Surgeon: Manya Silvas, MD;  Location: [facility] ENDOSCOPY;  Service: Endoscopy;  Laterality: N/A;  . ENDOSCOPIC RETROGRADE CHOLANGIOPANCREATOGRAPHY (ERCP) WITH PROPOFOL    . eyelid surgery    . FOOT SURGERY    . HEMORROIDECTOMY    . HERNIA REPAIR    . mrercp    . RHINOPLASTY    . TONSILLECTOMY    . WISDOM TOOTH EXTRACTION       Family History  Problem Relation Age of Onset  . Hypothyroidism Mother   . Diabetes Mother   . Lung cancer Mother   no bleeding or clotting disorders   Social History   Tobacco Use  . Smoking status: Former  Smoker    Packs/day: 1.00    Years: 25.00    Pack years: 25.00    Types: Cigarettes    Quit date: 12/05/1986    Years since quitting: 32.3  . Smokeless tobacco: Never Used  Substance Use Topics  . Alcohol use: Yes    Alcohol/week: 14.0 standard drinks    Types: 14 Cans of beer per week    Comment: 2 beers per day  . Drug use: No    Allergies  Allergen Reactions  . Nsaids     Current Outpatient Medications  Medication Sig Dispense Refill  . acetaminophen (TYLENOL) 325 MG tablet Take 650 mg by mouth every 6 (six) hours as needed.    . cetirizine (ZYRTEC) 10 MG tablet Take 10 mg by mouth daily.    . ferrous sulfate 325 (65 FE) MG tablet Take 325 mg by mouth daily with breakfast.    . Multiple Vitamin (MULTIVITAMIN) tablet Take 1 tablet by mouth daily.    . tamsulosin (FLOMAX) 0.4 MG CAPS capsule Take 1 capsule (0.4 mg total) by mouth daily after breakfast. 90 capsule 3  . timolol (TIMOPTIC) 0.5 % ophthalmic solution Place 1  drop into both eyes daily.     . vitamin B-12 (CYANOCOBALAMIN) 500 MCG tablet Take 500 mcg by mouth daily.    . vitamin C (ASCORBIC ACID) 500 MG tablet Take 500 mg by mouth daily.     No current facility-administered medications for this visit.      REVIEW OF SYSTEMS (Negative unless checked)  Constitutional: [] Weight loss  [] Fever  [] Chills Cardiac: [] Chest pain   [] Chest pressure   [] Palpitations   [] Shortness of breath when laying flat   [] Shortness of breath at rest   [] Shortness of breath with exertion. Vascular:  [x] Pain in legs with walking   [x] Pain in legs at rest   [] Pain in legs when laying flat   [] Claudication   [] Pain in feet when walking  [] Pain in feet at rest  [] Pain in feet when laying flat   [] History of DVT   [] Phlebitis   [x] Swelling in legs   [] Varicose veins   [] Non-healing ulcers Pulmonary:   [] Uses home oxygen   [] Productive cough   [] Hemoptysis   [] Wheeze  [] COPD   [] Asthma Neurologic:  [] Dizziness  [] Blackouts   [] Seizures    [] History of stroke   [] History of TIA  [] Aphasia   [] Temporary blindness   [] Dysphagia   [] Weakness or numbness in arms   [] Weakness or numbness in legs Musculoskeletal:  [x] Arthritis   [] Joint swelling   [] Joint pain   [] Low back pain Hematologic:  [] Easy bruising  [] Easy bleeding   [] Hypercoagulable state   [] Anemic  [] Hepatitis Gastrointestinal:  [] Blood in stool   [] Vomiting blood  [] Gastroesophageal reflux/heartburn   [] Abdominal pain Genitourinary:  [] Chronic kidney disease   [] Difficult urination  [] Frequent urination  [] Burning with urination   [] Hematuria Skin:  [] Rashes   [] Ulcers   [] Wounds Psychological:  [] History of anxiety   []  History of major depression.    Physical Exam BP 136/78 (BP Location: Left Arm)   Pulse 78   Resp 16   Wt 211 lb (95.7 kg)   BMI 34.06 kg/m  Gen:  WD/WN, NAD. Appears younger than stated age. Head: [place]/AT, No temporalis wasting. Ear/Nose/Throat: Hearing grossly intact, nares w/o erythema or drainage, oropharynx w/o Erythema/Exudate Eyes: Conjunctiva clear, sclera non-icteric  Neck: trachea midline.  No JVD.  Pulmonary:  Good air movement, respirations not labored, no use of accessory muscles  Cardiac: RRR, no JVD Vascular:  Vessel Right Left  Radial Palpable Palpable                          DP 2+ 2+  PT 1+ 2+   Gastrointestinal:. No masses, surgical incisions, or scars. Musculoskeletal: M/S 5/5 throughout.  Extremities without ischemic changes.  No deformity or atrophy. Trace RLE edema. Neurologic: Sensation grossly intact in extremities.  Symmetrical.  Speech is fluent. Motor exam as listed above. Psychiatric: Judgment intact, Mood & affect appropriate for pt's clinical situation. Dermatologic: No rashes or ulcers noted.  No cellulitis or open wounds.    Radiology No results found.  Labs Recent Results (from the past 2160 hour(s))  Vitamin B12     Status: None   Collection Time: 01/16/19  2:05 PM  Result Value Ref Range    Vitamin B-12 307 180 - 914 pg/mL    Comment: (NOTE) This assay is not validated for testing neonatal or myeloproliferative syndrome specimens for Vitamin B12 levels. Performed at [facility], [place] [address], [place], [place] [address]   Iron and  TIBC     Status: Abnormal   Collection Time: 01/16/19  2:05 PM  Result Value Ref Range   Iron 36 (L) 45 - 182 ug/dL   TIBC 413 250 - 450 ug/dL   Saturation Ratios 9 (L) 17.9 - 39.5 %   UIBC 377 ug/dL    Comment: Performed at [facility], [address] [place], [place] [address]  Ferritin     Status: Abnormal   Collection Time: 01/16/19  2:05 PM  Result Value Ref Range   Ferritin 7 (L) 24 - 336 ng/mL    Comment: Performed at [facility], [address] [place], [place] [address]  CBC with Differential/Platelet     Status: Abnormal   Collection Time: 01/16/19  2:05 PM  Result Value Ref Range   WBC 5.7 4.0 - 10.5 K/uL   RBC 4.01 (L) 4.22 - 5.81 MIL/uL   Hemoglobin 11.5 (L) 13.0 - 17.0 g/dL   HCT 36.1 (L) 39.0 - 52.0 %   MCV 90.0 80.0 - 100.0 fL   MCH 28.7 26.0 - 34.0 pg   MCHC 31.9 30.0 - 36.0 g/dL   RDW 16.3 (H) 11.5 - 15.5 %   Platelets 164 150 - 400 K/uL   nRBC 0.0 0.0 - 0.2 %   Neutrophils Relative % 61 %   Neutro Abs 3.4 1.7 - 7.7 K/uL   Lymphocytes Relative 24 %   Lymphs Abs 1.4 0.7 - 4.0 K/uL   Monocytes Relative 11 %   Monocytes Absolute 0.6 0.1 - 1.0 K/uL   Eosinophils Relative 4 %   Eosinophils Absolute 0.2 0.0 - 0.5 K/uL   Basophils Relative 0 %   Basophils Absolute 0.0 0.0 - 0.1 K/uL   Immature Granulocytes 0 %   Abs Immature Granulocytes 0.01 0.00 - 0.07 K/uL    Comment: Performed at [facility], [address] [place], [place] [address]    Assessment/Plan:  Lumbar radiculitis Could be contributing to some of his lower extremity symptoms.  Borderline hypertension Not currently on meds. blood pressure control important in reducing the progression of atherosclerotic  disease.    Pain in limb The patient reports what sounds like it was a superficial thrombophlebitis on the right posterior calf.  There is no obvious cord or redness today.  Compression stockings which she has begun wearing, elevation, and heat will help this.  We are going to get an ultrasound in the next week or so at his convenience to evaluate the situation.  This could be venous reflux as well.  Patient voices his understanding and will return with his duplex in the near future.      Leotis Pain 03/28/2019, 11:50 AM   This note was created with Dragon medical transcription system.  Any errors from dictation are unintentional.

## 2019-03-28 NOTE — Assessment & Plan Note (Signed)
Could be contributing to some of his lower extremity symptoms.

## 2019-03-28 NOTE — Assessment & Plan Note (Signed)
Not currently on meds. blood pressure control important in reducing the progression of atherosclerotic disease.

## 2019-03-28 NOTE — Assessment & Plan Note (Signed)
The patient reports what sounds like it was a superficial thrombophlebitis on the right posterior calf.  There is no obvious cord or redness today.  Compression stockings which she has begun wearing, elevation, and heat will help this.  We are going to get an ultrasound in the next week or so at his convenience to evaluate the situation.  This could be venous reflux as well.  Patient voices his understanding and will return with his duplex in the near future.

## 2019-03-28 NOTE — Patient Instructions (Signed)

## 2019-03-31 ENCOUNTER — Ambulatory Visit (INDEPENDENT_AMBULATORY_CARE_PROVIDER_SITE_OTHER): Payer: PPO

## 2019-03-31 ENCOUNTER — Ambulatory Visit (INDEPENDENT_AMBULATORY_CARE_PROVIDER_SITE_OTHER): Payer: PPO | Admitting: Nurse Practitioner

## 2019-03-31 ENCOUNTER — Other Ambulatory Visit: Payer: Self-pay

## 2019-03-31 ENCOUNTER — Encounter (INDEPENDENT_AMBULATORY_CARE_PROVIDER_SITE_OTHER): Payer: Self-pay | Admitting: Nurse Practitioner

## 2019-03-31 VITALS — BP 123/79 | HR 73 | Resp 12 | Ht 66.0 in | Wt 210.0 lb

## 2019-03-31 DIAGNOSIS — I82431 Acute embolism and thrombosis of right popliteal vein: Secondary | ICD-10-CM

## 2019-03-31 DIAGNOSIS — M79604 Pain in right leg: Secondary | ICD-10-CM | POA: Diagnosis not present

## 2019-03-31 DIAGNOSIS — I8001 Phlebitis and thrombophlebitis of superficial vessels of right lower extremity: Secondary | ICD-10-CM

## 2019-03-31 DIAGNOSIS — R030 Elevated blood-pressure reading, without diagnosis of hypertension: Secondary | ICD-10-CM | POA: Diagnosis not present

## 2019-03-31 MED ORDER — ELIQUIS DVT/PE STARTER PACK 5 MG PO TBPK: ORAL_TABLET | ORAL | 0 refills | Status: DC

## 2019-03-31 NOTE — Patient Instructions (Signed)
-   go to ED if you feel short of breath, chest pain or like you can't get your breathe.  -Do not wear compression socks until 1/29/20201  -Plan to be on anticoagulation for at least 3 months.

## 2019-03-31 NOTE — Progress Notes (Signed)
SUBJECTIVE:  Patient ID: Marcus Coleman, male    DOB: 1940/09/14, 79 y.o.   MRN: [id number] Chief Complaint  Patient presents with  . Follow-up    U/S Follow up    HPI  Marcus Coleman is a 79 y.o. male presents today for follow-up noninvasive studies regarding pain and swelling in his right leg that have been going on for the last couple of weeks.  The patient states that it started as pain in his right calf as a severe pain.  Since that time the pain has lessened.  The patient at his previous assessment a couple of weeks ago there was a hard knotted area on his calf.  And it was originally felt to be a thrombophlebitis.  Patient denies having any chest pain or shortness of breath.  He denies any acute anxiety.  He denies any TIA or strokelike symptoms.  Today the patient has evidence of a superficial thrombophlebitis in his right small saphenous vein.  There is also evidence of a popliteal thrombus as well in the right lower extremity.  These are both soft and echogenic with indeterminate age.  Previously the patient was treated for varicose veins with endovenous laser ablation.  The right great saphenous vein continues to be closed from the proximal thigh distally.  No evidence of DVT in the left lower extremity.  Past Medical History:  Diagnosis Date  . Anemia   . Chronic diarrhea   . DDD (degenerative disc disease), cervical   . Hyperlipidemia   . Hypertension    borderline  . IDA (iron deficiency anemia)   . Low testosterone   . Vasovagal syncope     Past Surgical History:  Procedure Laterality Date  . CARDIAC CATHETERIZATION    . catracts Bilateral   . CHOLECYSTECTOMY    . COLONOSCOPY WITH PROPOFOL N/A 06/26/2016   Procedure: COLONOSCOPY WITH PROPOFOL;  Surgeon: Manya Silvas, MD;  Location: [facility] ENDOSCOPY;  Service: Endoscopy;  Laterality: N/A;  . ENDOSCOPIC RETROGRADE CHOLANGIOPANCREATOGRAPHY (ERCP) WITH PROPOFOL    . eyelid surgery    . FOOT SURGERY    .  HEMORROIDECTOMY    . HERNIA REPAIR    . mrercp    . RHINOPLASTY    . TONSILLECTOMY    . WISDOM TOOTH EXTRACTION      Social History   Socioeconomic History  . Marital status: Married    Spouse name: Not on file  . Number of children: Not on file  . Years of education: Not on file  . Highest education level: Not on file  Occupational History  . Not on file  Tobacco Use  . Smoking status: Former Smoker    Packs/day: 1.00    Years: 25.00    Pack years: 25.00    Types: Cigarettes    Quit date: 12/05/1986    Years since quitting: 32.3  . Smokeless tobacco: Never Used  Substance and Sexual Activity  . Alcohol use: Yes    Alcohol/week: 14.0 standard drinks    Types: 14 Cans of beer per week    Comment: 2 beers per day  . Drug use: No  . Sexual activity: Not on file  Other Topics Concern  . Not on file  Social History Narrative  . Not on file   Social Determinants of Health   Financial Resource Strain:   . Difficulty of Paying Living Expenses: Not on file  Food Insecurity:   . Worried About Charity fundraiser in  the Last Year: Not on file  . Ran Out of Food in the Last Year: Not on file  Transportation Needs:   . Lack of Transportation (Medical): Not on file  . Lack of Transportation (Non-Medical): Not on file  Physical Activity:   . Days of Exercise per Week: Not on file  . Minutes of Exercise per Session: Not on file  Stress:   . Feeling of Stress : Not on file  Social Connections:   . Frequency of Communication with Friends and Family: Not on file  . Frequency of Social Gatherings with Friends and Family: Not on file  . Attends Religious Services: Not on file  . Active Member of Clubs or Organizations: Not on file  . Attends Archivist Meetings: Not on file  . Marital Status: Not on file  Intimate Partner Violence:   . Fear of Current or Ex-Partner: Not on file  . Emotionally Abused: Not on file  . Physically Abused: Not on file  . Sexually  Abused: Not on file    Family History  Problem Relation Age of Onset  . Hypothyroidism Mother   . Diabetes Mother   . Lung cancer Mother     Allergies  Allergen Reactions  . Nsaids      Review of Systems   Review of Systems: Negative Unless Checked Constitutional: [] Weight loss  [] Fever  [] Chills Cardiac: [] Chest pain   []  Atrial Fibrillation  [] Palpitations   [] Shortness of breath when laying flat   [] Shortness of breath with exertion. [] Shortness of breath at rest Vascular:  [] Pain in legs with walking   [] Pain in legs with standing [] Pain in legs when laying flat   [] Claudication    [] Pain in feet when laying flat    [] History of DVT   [] Phlebitis   [] Swelling in legs   [] Varicose veins   [] Non-healing ulcers Pulmonary:   [] Uses home oxygen   [] Productive cough   [] Hemoptysis   [] Wheeze  [] COPD   [] Asthma Neurologic:  [] Dizziness   [] Seizures  [] Blackouts [] History of stroke   [] History of TIA  [] Aphasia   [] Temporary Blindness   [] Weakness or numbness in arm   [] Weakness or numbness in leg Musculoskeletal:   [] Joint swelling   [] Joint pain   [x] Low back pain  []  History of Knee Replacement [x] Arthritis [] back Surgeries  []  Spinal Stenosis    Hematologic:  [] Easy bruising  [] Easy bleeding   [] Hypercoagulable state   [x] Anemic Gastrointestinal:  [] Diarrhea   [] Vomiting  [] Gastroesophageal reflux/heartburn   [] Difficulty swallowing. [] Abdominal pain Genitourinary:  [] Chronic kidney disease   [] Difficult urination  [] Anuric   [] Blood in urine [] Frequent urination  [] Burning with urination   [] Hematuria Skin:  [] Rashes   [] Ulcers [] Wounds Psychological:  [] History of anxiety   []  History of major depression  []  Memory Difficulties      OBJECTIVE:   Physical Exam  BP 123/79 (BP Location: Right Arm)   Pulse 73   Resp 12   Ht 5\' 6"  (1.676 m)   Wt 210 lb (95.3 kg)   BMI 33.89 kg/m   Gen: WD/WN, NAD Head: [place]/AT, No temporalis wasting.  Ear/Nose/Throat: Hearing grossly intact,  nares w/o erythema or drainage Eyes: PER, EOMI, sclera nonicteric.  Neck: Supple, no masses.  No JVD.  Pulmonary:  Good air movement, no use of accessory muscles.  Cardiac: RRR Vascular: 2+ edema RLE Vessel Right Left  Posterior Tibial Palpable Palpable   Gastrointestinal: soft, non-distended. No guarding/no peritoneal signs.  Musculoskeletal: M/S 5/5 throughout.  No deformity or atrophy.  Neurologic: Pain and light touch intact in extremities.  Symmetrical.  Speech is fluent. Motor exam as listed above. Psychiatric: Judgment intact, Mood & affect appropriate for pt's clinical situation.        ASSESSMENT AND PLAN:  1. Acute deep vein thrombosis (DVT) of popliteal vein of right lower extremity (HCC) The patient has an incidental finding of a DVT in his right popliteal vein.  Due to the fact that it is an indeterminate age we will treat this as an acute DVT.  Patient will be started on Eliquis he was originally given a prescription for a starter pack.  We will start her back is over we will send in a prescription for normal 30-day supply.  We will anticipate 3 months of anticoagulation given the fact that it is in such a small area.  We will have the patient come back in 6 to 8 weeks to ensure that it has progressed from acute to chronic.  2. Thrombophlebitis of superficial veins of right lower extremity Patient is concurrently being treated for a DVT.  This will also suffice for treatment regarding thrombophlebitis.  Patient advised to begin wearing compression stockings in approximately 2 weeks to help with recurrence of superficial thrombophlebitis.  3. Borderline hypertension Excellent control today.  Patient generally well controlled without medication.  No changes made today.   Current Outpatient Medications on File Prior to Visit  Medication Sig Dispense Refill  . acetaminophen (TYLENOL) 325 MG tablet Take 650 mg by mouth every 6 (six) hours as needed.    . cetirizine (ZYRTEC)  10 MG tablet Take 10 mg by mouth daily.    . ferrous sulfate 325 (65 FE) MG tablet Take 325 mg by mouth daily with breakfast.    . Multiple Vitamin (MULTIVITAMIN) tablet Take 1 tablet by mouth daily.    . tamsulosin (FLOMAX) 0.4 MG CAPS capsule Take 1 capsule (0.4 mg total) by mouth daily after breakfast. 90 capsule 3  . timolol (TIMOPTIC) 0.5 % ophthalmic solution Place 1 drop into both eyes daily.     . vitamin B-12 (CYANOCOBALAMIN) 500 MCG tablet Take 500 mcg by mouth daily.    . vitamin C (ASCORBIC ACID) 500 MG tablet Take 500 mg by mouth daily.     No current facility-administered medications on file prior to visit.    Patient Instructions  - go to ED if you feel short of breath, chest pain or like you can't get your breathe.  -Do not wear compression socks until 1/29/20201  -Plan to be on anticoagulation for at least 3 months.   No follow-ups on file.   Kris Hartmann, NP  This note was completed with Sales executive.  Any errors are purely unintentional.

## 2019-04-01 DIAGNOSIS — M5432 Sciatica, left side: Secondary | ICD-10-CM | POA: Diagnosis not present

## 2019-04-01 DIAGNOSIS — M955 Acquired deformity of pelvis: Secondary | ICD-10-CM | POA: Diagnosis not present

## 2019-04-01 DIAGNOSIS — M9903 Segmental and somatic dysfunction of lumbar region: Secondary | ICD-10-CM | POA: Diagnosis not present

## 2019-04-01 DIAGNOSIS — M9905 Segmental and somatic dysfunction of pelvic region: Secondary | ICD-10-CM | POA: Diagnosis not present

## 2019-04-17 ENCOUNTER — Other Ambulatory Visit
Admission: RE | Admit: 2019-04-17 | Discharge: 2019-04-17 | Disposition: A | Discharge status: Home / Self Care | Payer: No Typology Code available for payment source | Source: Ambulatory Visit | Attending: Physician Assistant | Admitting: Physician Assistant

## 2019-04-17 ENCOUNTER — Encounter: Payer: Self-pay | Admitting: Emergency Medicine

## 2019-04-17 ENCOUNTER — Other Ambulatory Visit: Payer: Self-pay

## 2019-04-17 ENCOUNTER — Inpatient Hospital Stay
Admission: EM | Admit: 2019-04-17 | Discharge: 2019-04-21 | DRG: 811 | Disposition: A | Discharge status: Home / Self Care | Payer: No Typology Code available for payment source | Source: Ambulatory Visit | Attending: Internal Medicine | Admitting: Internal Medicine

## 2019-04-17 DIAGNOSIS — Z8719 Personal history of other diseases of the digestive system: Secondary | ICD-10-CM

## 2019-04-17 DIAGNOSIS — Z833 Family history of diabetes mellitus: Secondary | ICD-10-CM | POA: Diagnosis not present

## 2019-04-17 DIAGNOSIS — R531 Weakness: Secondary | ICD-10-CM | POA: Diagnosis not present

## 2019-04-17 DIAGNOSIS — R42 Dizziness and giddiness: Secondary | ICD-10-CM | POA: Diagnosis not present

## 2019-04-17 DIAGNOSIS — K921 Melena: Secondary | ICD-10-CM | POA: Diagnosis present

## 2019-04-17 DIAGNOSIS — D509 Iron deficiency anemia, unspecified: Secondary | ICD-10-CM | POA: Diagnosis not present

## 2019-04-17 DIAGNOSIS — Z87891 Personal history of nicotine dependence: Secondary | ICD-10-CM | POA: Diagnosis not present

## 2019-04-17 DIAGNOSIS — N400 Enlarged prostate without lower urinary tract symptoms: Secondary | ICD-10-CM | POA: Diagnosis not present

## 2019-04-17 DIAGNOSIS — D518 Other vitamin B12 deficiency anemias: Secondary | ICD-10-CM | POA: Diagnosis not present

## 2019-04-17 DIAGNOSIS — K449 Diaphragmatic hernia without obstruction or gangrene: Secondary | ICD-10-CM | POA: Diagnosis not present

## 2019-04-17 DIAGNOSIS — K259 Gastric ulcer, unspecified as acute or chronic, without hemorrhage or perforation: Secondary | ICD-10-CM | POA: Diagnosis not present

## 2019-04-17 DIAGNOSIS — R0789 Other chest pain: Secondary | ICD-10-CM | POA: Insufficient documentation

## 2019-04-17 DIAGNOSIS — Z886 Allergy status to analgesic agent status: Secondary | ICD-10-CM

## 2019-04-17 DIAGNOSIS — Z86718 Personal history of other venous thrombosis and embolism: Secondary | ICD-10-CM | POA: Diagnosis not present

## 2019-04-17 DIAGNOSIS — I82401 Acute embolism and thrombosis of unspecified deep veins of right lower extremity: Secondary | ICD-10-CM | POA: Diagnosis not present

## 2019-04-17 DIAGNOSIS — Z9089 Acquired absence of other organs: Secondary | ICD-10-CM

## 2019-04-17 DIAGNOSIS — Z9841 Cataract extraction status, right eye: Secondary | ICD-10-CM

## 2019-04-17 DIAGNOSIS — I824Z1 Acute embolism and thrombosis of unspecified deep veins of right distal lower extremity: Secondary | ICD-10-CM | POA: Diagnosis not present

## 2019-04-17 DIAGNOSIS — D508 Other iron deficiency anemias: Secondary | ICD-10-CM

## 2019-04-17 DIAGNOSIS — M5030 Other cervical disc degeneration, unspecified cervical region: Secondary | ICD-10-CM | POA: Diagnosis not present

## 2019-04-17 DIAGNOSIS — Z86010 Personal history of colonic polyps: Secondary | ICD-10-CM

## 2019-04-17 DIAGNOSIS — K254 Chronic or unspecified gastric ulcer with hemorrhage: Secondary | ICD-10-CM | POA: Diagnosis not present

## 2019-04-17 DIAGNOSIS — Z9842 Cataract extraction status, left eye: Secondary | ICD-10-CM | POA: Diagnosis not present

## 2019-04-17 DIAGNOSIS — F1020 Alcohol dependence, uncomplicated: Secondary | ICD-10-CM | POA: Diagnosis present

## 2019-04-17 DIAGNOSIS — M5135 Other intervertebral disc degeneration, thoracolumbar region: Secondary | ICD-10-CM | POA: Diagnosis present

## 2019-04-17 DIAGNOSIS — Z20822 Contact with and (suspected) exposure to covid-19: Secondary | ICD-10-CM | POA: Diagnosis present

## 2019-04-17 DIAGNOSIS — Z9049 Acquired absence of other specified parts of digestive tract: Secondary | ICD-10-CM

## 2019-04-17 DIAGNOSIS — E538 Deficiency of other specified B group vitamins: Secondary | ICD-10-CM | POA: Diagnosis present

## 2019-04-17 DIAGNOSIS — D62 Acute posthemorrhagic anemia: Secondary | ICD-10-CM | POA: Diagnosis not present

## 2019-04-17 DIAGNOSIS — Z801 Family history of malignant neoplasm of trachea, bronchus and lung: Secondary | ICD-10-CM | POA: Diagnosis not present

## 2019-04-17 DIAGNOSIS — Z8672 Personal history of thrombophlebitis: Secondary | ICD-10-CM | POA: Diagnosis not present

## 2019-04-17 DIAGNOSIS — K2920 Alcoholic gastritis without bleeding: Secondary | ICD-10-CM | POA: Diagnosis not present

## 2019-04-17 DIAGNOSIS — K922 Gastrointestinal hemorrhage, unspecified: Secondary | ICD-10-CM | POA: Diagnosis not present

## 2019-04-17 DIAGNOSIS — I340 Nonrheumatic mitral (valve) insufficiency: Secondary | ICD-10-CM | POA: Diagnosis present

## 2019-04-17 DIAGNOSIS — Z8349 Family history of other endocrine, nutritional and metabolic diseases: Secondary | ICD-10-CM

## 2019-04-17 DIAGNOSIS — Z79899 Other long term (current) drug therapy: Secondary | ICD-10-CM

## 2019-04-17 DIAGNOSIS — E785 Hyperlipidemia, unspecified: Secondary | ICD-10-CM | POA: Diagnosis present

## 2019-04-17 DIAGNOSIS — D519 Vitamin B12 deficiency anemia, unspecified: Secondary | ICD-10-CM

## 2019-04-17 DIAGNOSIS — Z7901 Long term (current) use of anticoagulants: Secondary | ICD-10-CM | POA: Diagnosis not present

## 2019-04-17 DIAGNOSIS — D649 Anemia, unspecified: Secondary | ICD-10-CM | POA: Diagnosis not present

## 2019-04-17 DIAGNOSIS — R0602 Shortness of breath: Secondary | ICD-10-CM | POA: Insufficient documentation

## 2019-04-17 LAB — RETICULOCYTES
Immature Retic Fract: 36.9 % — ABNORMAL HIGH (ref 2.3–15.9)
RBC.: 2.97 MIL/uL — ABNORMAL LOW (ref 4.22–5.81)
Retic Count, Absolute: 75.7 10*3/uL (ref 19.0–186.0)
Retic Ct Pct: 2.6 % (ref 0.4–3.1)

## 2019-04-17 LAB — BASIC METABOLIC PANEL
Anion gap: 8 (ref 5–15)
BUN: 38 mg/dL — ABNORMAL HIGH (ref 8–23)
CO2: 26 mmol/L (ref 22–32)
Calcium: 8.4 mg/dL — ABNORMAL LOW (ref 8.9–10.3)
Chloride: 106 mmol/L (ref 98–111)
Creatinine, Ser: 0.82 mg/dL (ref 0.61–1.24)
GFR calc Af Amer: >60 mL/min (ref >60)
GFR calc non Af Amer: >60 mL/min (ref >60)
Glucose, Bld: 118 mg/dL — ABNORMAL HIGH (ref 70–99)
Potassium: 3.7 mmol/L (ref 3.5–5.1)
Sodium: 140 mmol/L (ref 135–145)

## 2019-04-17 LAB — CBC
HCT: 21.4 % — ABNORMAL LOW (ref 39.0–52.0)
Hemoglobin: 5.9 g/dL — ABNORMAL LOW (ref 13.0–17.0)
MCH: 19.6 pg — ABNORMAL LOW (ref 26.0–34.0)
MCHC: 27.6 g/dL — ABNORMAL LOW (ref 30.0–36.0)
MCV: 71.1 fL — ABNORMAL LOW (ref 80.0–100.0)
Platelets: 232 10*3/uL (ref 150–400)
RBC: 3.01 MIL/uL — ABNORMAL LOW (ref 4.22–5.81)
RDW: 20.9 % — ABNORMAL HIGH (ref 11.5–15.5)
WBC: 5.4 10*3/uL (ref 4.0–10.5)
nRBC: 0 % (ref 0.0–0.2)

## 2019-04-17 LAB — TROPONIN I (HIGH SENSITIVITY)
Troponin I (High Sensitivity): 8 ng/L (ref <18)
Troponin I (High Sensitivity): 7 ng/L (ref <18)

## 2019-04-17 LAB — FERRITIN: Ferritin: 4 ng/mL — ABNORMAL LOW (ref 24–336)

## 2019-04-17 LAB — URINALYSIS, COMPLETE (UACMP) WITH MICROSCOPIC
Bacteria, UA: NONE SEEN
Bilirubin Urine: NEGATIVE
Glucose, UA: NEGATIVE mg/dL
Hgb urine dipstick: NEGATIVE
Ketones, ur: NEGATIVE mg/dL
Leukocytes,Ua: NEGATIVE
Nitrite: NEGATIVE
Protein, ur: NEGATIVE mg/dL
Specific Gravity, Urine: 1.021 (ref 1.005–1.030)
Squamous Epithelial / HPF: NONE SEEN (ref 0–5)
pH: 5 (ref 5.0–8.0)

## 2019-04-17 LAB — ABO/RH: ABO/RH(D): A POS

## 2019-04-17 LAB — FOLATE: Folate: 8.8 ng/mL (ref >5.9)

## 2019-04-17 LAB — VITAMIN B12: Vitamin B-12: 215 pg/mL (ref 180–914)

## 2019-04-17 LAB — PREPARE RBC (CROSSMATCH)

## 2019-04-17 LAB — IRON AND TIBC
Iron: 14 ug/dL — ABNORMAL LOW (ref 45–182)
Saturation Ratios: 3 % — ABNORMAL LOW (ref 17.9–39.5)
TIBC: 505 ug/dL — ABNORMAL HIGH (ref 250–450)
UIBC: 491 ug/dL

## 2019-04-17 MED ORDER — DOCUSATE SODIUM 100 MG PO CAPS
100.0000 mg | ORAL_CAPSULE | Freq: Two times a day (BID) | ORAL | Status: DC
  Administered 2019-04-17 – 2019-04-21 (×6): 100 mg via ORAL
  Filled 2019-04-17 (×8): qty 1

## 2019-04-17 MED ORDER — TIMOLOL MALEATE 0.5 % OP SOLN
1.0000 [drp] | Freq: Every day | OPHTHALMIC | Status: DC
  Administered 2019-04-18 – 2019-04-21 (×4): 1 [drp] via OPHTHALMIC
  Filled 2019-04-17: qty 5

## 2019-04-17 MED ORDER — LORATADINE 10 MG PO TABS
10.0000 mg | ORAL_TABLET | Freq: Every day | ORAL | Status: DC
  Administered 2019-04-18 – 2019-04-21 (×3): 10 mg via ORAL
  Filled 2019-04-17 (×4): qty 1

## 2019-04-17 MED ORDER — ONDANSETRON HCL 4 MG/2ML IJ SOLN: 4.0000 mg | Freq: Four times a day (QID) | INTRAMUSCULAR | Status: DC | PRN

## 2019-04-17 MED ORDER — ACETAMINOPHEN 325 MG PO TABS: 650.0000 mg | ORAL_TABLET | Freq: Four times a day (QID) | ORAL | Status: DC | PRN

## 2019-04-17 MED ORDER — ONDANSETRON HCL 4 MG PO TABS: 4.0000 mg | ORAL_TABLET | Freq: Four times a day (QID) | ORAL | Status: DC | PRN

## 2019-04-17 MED ORDER — TRAZODONE HCL 50 MG PO TABS: 25.0000 mg | ORAL_TABLET | Freq: Every evening | ORAL | Status: DC | PRN

## 2019-04-17 MED ORDER — VITAMIN B-12 1000 MCG PO TABS
500.0000 ug | ORAL_TABLET | Freq: Every day | ORAL | Status: DC
  Administered 2019-04-18 – 2019-04-21 (×3): 500 ug via ORAL
  Filled 2019-04-17 (×4): qty 1

## 2019-04-17 MED ORDER — TAMSULOSIN HCL 0.4 MG PO CAPS
0.4000 mg | ORAL_CAPSULE | Freq: Every day | ORAL | Status: DC
  Administered 2019-04-18 – 2019-04-21 (×3): 0.4 mg via ORAL
  Filled 2019-04-17 (×4): qty 1

## 2019-04-17 MED ORDER — FERROUS SULFATE 325 (65 FE) MG PO TABS
325.0000 mg | ORAL_TABLET | Freq: Every day | ORAL | Status: DC
  Filled 2019-04-17: qty 1

## 2019-04-17 MED ORDER — SODIUM CHLORIDE 0.9% IV SOLUTION: Freq: Once | INTRAVENOUS | Status: DC

## 2019-04-17 MED ORDER — HYDROCODONE-ACETAMINOPHEN 5-325 MG PO TABS: 1.0000 | ORAL_TABLET | ORAL | Status: DC | PRN

## 2019-04-17 MED ORDER — SODIUM CHLORIDE 0.9 % IV SOLN: INTRAVENOUS | Status: DC

## 2019-04-17 MED ORDER — SODIUM CHLORIDE 0.9 % IV SOLN
10.0000 mL/h | Freq: Once | INTRAVENOUS | Status: AC
  Administered 2019-04-17: 10 mL/h via INTRAVENOUS

## 2019-04-17 MED ORDER — ASCORBIC ACID 500 MG PO TABS
500.0000 mg | ORAL_TABLET | Freq: Every day | ORAL | Status: DC
  Administered 2019-04-18 – 2019-04-21 (×3): 500 mg via ORAL
  Filled 2019-04-17 (×4): qty 1

## 2019-04-17 MED ORDER — BISACODYL 5 MG PO TBEC: 5.0000 mg | DELAYED_RELEASE_TABLET | Freq: Every day | ORAL | Status: DC | PRN

## 2019-04-17 MED ORDER — ADULT MULTIVITAMIN W/MINERALS CH
1.0000 | ORAL_TABLET | Freq: Every day | ORAL | Status: DC
  Administered 2019-04-18 – 2019-04-21 (×3): 1 via ORAL
  Filled 2019-04-17 (×4): qty 1

## 2019-04-17 MED ORDER — ACETAMINOPHEN 650 MG RE SUPP: 650.0000 mg | Freq: Four times a day (QID) | RECTAL | Status: DC | PRN

## 2019-04-17 NOTE — ED Notes (Signed)
Report given to Gracie, RN 

## 2019-04-17 NOTE — ED Triage Notes (Signed)
Sent by kc for hgb 5.  Denies any bleeding.

## 2019-04-17 NOTE — ED Triage Notes (Signed)
Patient presents to the ED after receiving a call from his PCP that his hemoglobin was 5.  Patient states he has had issues with anemia in the past but it has never been that low.  Labs from this fall show patient's hemoglobin at 12.  Patient states he had a DVT behind his right knee in mid-January and was placed on eliquis.  His PCP told him to stop taking the eliquis today.

## 2019-04-17 NOTE — H&P (Signed)
[facility] [place] NAME: Marcus Coleman    MR#:  [id number]  DATE OF BIRTH:  May 07, 1940  DATE OF ADMISSION:  04/17/2019  PRIMARY CARE PHYSICIAN: Idelle Crouch, MD   REQUESTING/REFERRING PHYSICIAN: Harvest Dark, MD  CHIEF COMPLAINT:   Chief Complaint  Patient presents with  . Abnormal Lab    HISTORY OF PRESENT ILLNESS:  Marcus Coleman  is a 79 y.o. male with a known history of iron deficiency anemia, hypertension, hyperlipidemia, is being admitted for severe anemia. According to the patient over the past several weeks he has been feeling weak and dizzy at times. Patient was started Eliquis mid-January for lower extremity DVT. He has not noticed any black or bloody stool.  His PCP office ([facility]) have asked him to stop taking Eliquis today before coming down to the emergency department.  Patient presents to the ED after receiving a call from his PCP that his hemoglobin was 5.  Patient states he has had issues with anemia in the past but it has never been that low.  Labs from this fall show patient's hemoglobin at 12.  PAST MEDICAL HISTORY:   Past Medical History:  Diagnosis Date  . Anemia   . Chronic diarrhea   . DDD (degenerative disc disease), cervical   . Hyperlipidemia   . Hypertension    borderline  . IDA (iron deficiency anemia)   . Low testosterone   . Vasovagal syncope     PAST SURGICAL HISTORY:   Past Surgical History:  Procedure Laterality Date  . CARDIAC CATHETERIZATION    . catracts Bilateral   . CHOLECYSTECTOMY    . COLONOSCOPY WITH PROPOFOL N/A 06/26/2016   Procedure: COLONOSCOPY WITH PROPOFOL;  Surgeon: Manya Silvas, MD;  Location: [facility] ENDOSCOPY;  Service: Endoscopy;  Laterality: N/A;  . ENDOSCOPIC RETROGRADE CHOLANGIOPANCREATOGRAPHY (ERCP) WITH PROPOFOL    . eyelid surgery    . FOOT SURGERY    . HEMORROIDECTOMY    . HERNIA REPAIR    . mrercp    . RHINOPLASTY    . TONSILLECTOMY    . WISDOM TOOTH  EXTRACTION      SOCIAL HISTORY:   Social History   Tobacco Use  . Smoking status: Former Smoker    Packs/day: 1.00    Years: 25.00    Pack years: 25.00    Types: Cigarettes    Quit date: 12/05/1986    Years since quitting: 32.3  . Smokeless tobacco: Never Used  Substance Use Topics  . Alcohol use: Yes    Alcohol/week: 14.0 standard drinks    Types: 14 Cans of beer per week    Comment: 2 beers per day    FAMILY HISTORY:   Family History  Problem Relation Age of Onset  . Hypothyroidism Mother   . Diabetes Mother   . Lung cancer Mother     DRUG ALLERGIES:   Allergies  Allergen Reactions  . Nsaids     REVIEW OF SYSTEMS:   Review of Systems  Constitutional: Positive for malaise/fatigue. Negative for diaphoresis, fever and weight loss.  HENT: Negative for ear discharge, ear pain, hearing loss, nosebleeds, sore throat and tinnitus.   Eyes: Negative for blurred vision and pain.  Respiratory: Negative for cough, hemoptysis, shortness of breath and wheezing.   Cardiovascular: Negative for chest pain, palpitations, orthopnea and leg swelling.  Gastrointestinal: Negative for abdominal pain, blood in stool, constipation, diarrhea, heartburn, nausea and vomiting.  Genitourinary: Negative for dysuria, frequency  and urgency.  Musculoskeletal: Negative for back pain and myalgias.  Skin: Negative for itching and rash.  Neurological: Negative for dizziness, tingling, tremors, focal weakness, seizures, weakness and headaches.  Psychiatric/Behavioral: Negative for depression. The patient is not nervous/anxious.     MEDICATIONS AT HOME:   Prior to Admission medications   Medication Sig Start Date End Date Taking? Authorizing Provider  Apixaban Starter Pack (ELIQUIS DVT/PE STARTER PACK) 5 MG TBPK Take as directed on package: start with two-5mg  tablets twice daily for 7 days. On day 8, switch to one-5mg  tablet twice daily. 03/31/19  Yes Kris Hartmann, NP  cetirizine (ZYRTEC) 10 MG  tablet Take 10 mg by mouth daily.   Yes [provider]  ferrous sulfate 325 (65 FE) MG tablet Take 325 mg by mouth daily with breakfast.   Yes [provider]  Multiple Vitamin (MULTIVITAMIN) tablet Take 1 tablet by mouth daily.   Yes [provider]  tamsulosin (FLOMAX) 0.4 MG CAPS capsule Take 1 capsule (0.4 mg total) by mouth daily after breakfast. 11/06/18  Yes Stoioff, Scott C, MD  timolol (TIMOPTIC) 0.5 % ophthalmic solution Place 1 drop into both eyes daily.  11/21/18  Yes [provider]  vitamin B-12 (CYANOCOBALAMIN) 500 MCG tablet Take 500 mcg by mouth daily.   Yes [provider]  vitamin C (ASCORBIC ACID) 500 MG tablet Take 500 mg by mouth daily.   Yes [provider]  acetaminophen (TYLENOL) 325 MG tablet Take 650 mg by mouth every 6 (six) hours as needed.    [provider]      VITAL SIGNS:  Blood pressure (!) 116/51, pulse 80, temperature 98.6 F (37 C), temperature source Oral, resp. rate 16, SpO2 98 %.  PHYSICAL EXAMINATION:  Physical Exam  GENERAL:  79 y.o.-year-old patient lying in the bed with no acute distress.  EYES: Pupils equal, round, reactive to light and accommodation. No scleral icterus. Extraocular muscles intact.  HEENT: Head atraumatic, normocephalic. Oropharynx and nasopharynx clear.  NECK:  Supple, no jugular venous distention. No thyroid enlargement, no tenderness.  LUNGS: Normal breath sounds bilaterally, no wheezing, rales,rhonchi or crepitation. No use of accessory muscles of respiration.  CARDIOVASCULAR: S1, S2 normal. No murmurs, rubs, or gallops.  ABDOMEN: Soft, nontender, nondistended. Bowel sounds present. No organomegaly or mass.  EXTREMITIES: No pedal edema, cyanosis, or clubbing.  NEUROLOGIC: Cranial nerves II through XII are intact. Muscle strength 5/5 in all extremities. Sensation intact. Gait not checked.  PSYCHIATRIC: The patient is alert and oriented x 3.  SKIN: No obvious  rash, lesion, or ulcer.   LABORATORY PANEL:   CBC Recent Labs  Lab 04/17/19 1442  WBC 5.4  HGB 5.9*  HCT 21.4*  PLT 232   ------------------------------------------------------------------------------------------------------------------  Chemistries  Recent Labs  Lab 04/17/19 1442  NA 140  K 3.7  CL 106  CO2 26  GLUCOSE 118*  BUN 38*  CREATININE 0.82  CALCIUM 8.4*   ------------------------------------------------------------------------------------------------------------------   IMPRESSION AND PLAN:  54 y m with k/h/o HTN, iron defi anemia, HLD admitted for symptomatic anemia  * Symptomatic Anemia - Hb 5 at office, recheck here is 5.9 - guaiac neg in ED - has known IDA (normally Hb in the range of 9-12) - recently started on eliquis in Mid-Jan  - reports neg previous luminal eval few yrs ago - will order 2 PRBC transfusion for now - monitor CBC - Anemia Panel - GI & Onco c/s for further eval  - hold eliquis  for now  * Dizziness/chest tightness - likely due to severe anemia. Monitor symptoms  * DVT of RLE: on eliquis since mid-Jan Hold eliquis      All the records are reviewed and case discussed with ED provider. Management plans discussed with the patient, nursing and they are in agreement.  CODE STATUS: Full code  TOTAL TIME TAKING CARE OF THIS PATIENT: 45 minutes.    Max Sane M.D on 04/17/2019 at 5:48 PM  [facility]   CC: Primary care physician; Idelle Crouch, MD   Note: This dictation was prepared with Dragon dictation along with smaller phrase technology. Any transcriptional errors that result from this process are unintentional.

## 2019-04-17 NOTE — ED Provider Notes (Signed)
[facility] Emergency Department Provider Note  Time seen: 5:41 PM  I have reviewed the triage vital signs and the nursing notes.   HISTORY  Chief Complaint Abnormal Lab   HPI Marcus Coleman is a 79 y.o. male with a past medical history of anemia, hypertension, hyperlipidemia, presents to the emergency department for weakness/dizziness.  According to the patient over the past several weeks he has been feeling weak and dizzy at times.  Patient started Eliquis 2 to 3 weeks ago per patient for lower extremity DVT.  Has not noticed any black or bloody stool.  Denies any chest pain denies any significant shortness of breath.  Denies any fever cough.  Largely negative review of systems.  Overall patient appears well currently.  Past Medical History:  Diagnosis Date  . Anemia   . Chronic diarrhea   . DDD (degenerative disc disease), cervical   . Hyperlipidemia   . Hypertension    borderline  . IDA (iron deficiency anemia)   . Low testosterone   . Vasovagal syncope     Patient Active Problem List   Diagnosis Date Noted  . Pain in limb 03/28/2019  . Nonrheumatic mitral valve regurgitation 12/10/2018  . Benign prostatic hyperplasia with lower urinary tract symptoms 11/06/2018  . Borderline hypertension 09/28/2017  . Hiatal hernia 09/28/2017  . Hyperlipidemia 09/28/2017  . Low testosterone 09/28/2017  . Vasovagal syncope 09/28/2017  . DDD (degenerative disc disease), thoracolumbar 11/13/2014  . Lumbar radiculitis 11/13/2014  . Iron deficiency anemia 10/06/2013  . Anemia 10/06/2013  . Gallstone pancreatitis 01/24/2013  . S/P ERCP 01/23/2013  . Stones common duct 01/23/2013    Past Surgical History:  Procedure Laterality Date  . CARDIAC CATHETERIZATION    . catracts Bilateral   . CHOLECYSTECTOMY    . COLONOSCOPY WITH PROPOFOL N/A 06/26/2016   Procedure: COLONOSCOPY WITH PROPOFOL;  Surgeon: Manya Silvas, MD;  Location: [facility] ENDOSCOPY;  Service:  Endoscopy;  Laterality: N/A;  . ENDOSCOPIC RETROGRADE CHOLANGIOPANCREATOGRAPHY (ERCP) WITH PROPOFOL    . eyelid surgery    . FOOT SURGERY    . HEMORROIDECTOMY    . HERNIA REPAIR    . mrercp    . RHINOPLASTY    . TONSILLECTOMY    . WISDOM TOOTH EXTRACTION      Prior to Admission medications   Medication Sig Start Date End Date Taking? Authorizing Provider  Apixaban Starter Pack (ELIQUIS DVT/PE STARTER PACK) 5 MG TBPK Take as directed on package: start with two-5mg  tablets twice daily for 7 days. On day 8, switch to one-5mg  tablet twice daily. 03/31/19  Yes Kris Hartmann, NP  cetirizine (ZYRTEC) 10 MG tablet Take 10 mg by mouth daily.   Yes [provider]  ferrous sulfate 325 (65 FE) MG tablet Take 325 mg by mouth daily with breakfast.   Yes [provider]  Multiple Vitamin (MULTIVITAMIN) tablet Take 1 tablet by mouth daily.   Yes [provider]  tamsulosin (FLOMAX) 0.4 MG CAPS capsule Take 1 capsule (0.4 mg total) by mouth daily after breakfast. 11/06/18  Yes Stoioff, Scott C, MD  timolol (TIMOPTIC) 0.5 % ophthalmic solution Place 1 drop into both eyes daily.  11/21/18  Yes [provider]  vitamin B-12 (CYANOCOBALAMIN) 500 MCG tablet Take 500 mcg by mouth daily.   Yes [provider]  vitamin C (ASCORBIC ACID) 500 MG tablet Take 500 mg by mouth daily.   Yes [provider]  acetaminophen (TYLENOL) 325 MG tablet  Take 650 mg by mouth every 6 (six) hours as needed.    [provider]    Allergies  Allergen Reactions  . Nsaids     Family History  Problem Relation Age of Onset  . Hypothyroidism Mother   . Diabetes Mother   . Lung cancer Mother     Social History Social History   Tobacco Use  . Smoking status: Former Smoker    Packs/day: 1.00    Years: 25.00    Pack years: 25.00    Types: Cigarettes    Quit date: 12/05/1986    Years since quitting: 32.3  . Smokeless tobacco: Never Used  Substance Use Topics  .  Alcohol use: Yes    Alcohol/week: 14.0 standard drinks    Types: 14 Cans of beer per week    Comment: 2 beers per day  . Drug use: No    Review of Systems Constitutional: Negative for fever. Cardiovascular: Negative for chest pain. Respiratory: Negative for shortness of breath. Gastrointestinal: Negative for abdominal pain Musculoskeletal: Negative for musculoskeletal complaints Neurological: Negative for headache All other ROS negative  ____________________________________________   PHYSICAL EXAM:  VITAL SIGNS: ED Triage Vitals [04/17/19 1427]  Enc Vitals Group     BP (!) 116/51     Pulse Rate 80     Resp 16     Temp 98.6 F (37 C)     Temp Source Oral     SpO2 98 %     Weight      Height      Head Circumference      Peak Flow      Pain Score      Pain Loc      Pain Edu?      Excl. in [place]?    Constitutional: Alert and oriented. Well appearing and in no distress. Eyes: Normal exam ENT      Head: Normocephalic and atraumatic.      Mouth/Throat: Mucous membranes are moist. Cardiovascular: Normal rate, regular rhythm.  Respiratory: Normal respiratory effort without tachypnea nor retractions. Breath sounds are clear  Gastrointestinal: Soft and nontender. No distention.   Musculoskeletal: Nontender with normal range of motion in all extremities.  Neurologic:  Normal speech and language. No gross focal neurologic deficits  Skin:  Skin is warm, dry and intact.  Psychiatric: Mood and affect are normal.  ____________________________________________    EKG  EKG viewed and interpreted by myself shows a normal sinus rhythm at 81 bpm with a narrow QRS, normal axis, normal intervals, nonspecific ST changes.  ____________________________________________   INITIAL IMPRESSION / ASSESSMENT AND PLAN / ED COURSE  Pertinent labs & imaging results that were available during my care of the patient were reviewed by me and considered in my medical decision making (see chart for  details).   Patient presents emergency department for generalized fatigue weakness over the past 2 weeks or so.  Patient's lab work shows a hemoglobin of 5.9.  In reviewing the patient's chart and care everywhere his baseline hemoglobin appears to be between 9 and 11.  Patient does see Dr. Woodfin Ganja again for iron infusions.  I spoke to his partner Dr. Rogue Bussing.  Given the significant decrease compared to his baseline he believes the patient requires admission to the hospital for further work-up and treatment.  I have consented the patient for blood products.  We will transfuse 2 units.  Patient is rectal exam is guaiac negative at this time.  Marcus Coleman was  evaluated in Emergency Department on 04/17/2019 for the symptoms described in the history of present illness. He was evaluated in the context of the global COVID-19 pandemic, which necessitated consideration that the patient might be at risk for infection with the SARS-CoV-2 virus that causes COVID-19. Institutional protocols and algorithms that pertain to the evaluation of patients at risk for COVID-19 are in a state of rapid change based on information released by regulatory bodies including the CDC and federal and state organizations. These policies and algorithms were followed during the patient's care in the ED.  CRITICAL CARE Performed by: Harvest Dark   Total critical care time: 30 minutes  Critical care time was exclusive of separately billable procedures and treating other patients.  Critical care was necessary to treat or prevent imminent or life-threatening deterioration.  Critical care was time spent personally by me on the following activities: development of treatment plan with patient and/or surrogate as well as nursing, discussions with consultants, evaluation of patient's response to treatment, examination of patient, obtaining history from patient or surrogate, ordering and performing treatments and interventions, ordering  and review of laboratory studies, ordering and review of radiographic studies, pulse oximetry and re-evaluation of patient's condition.  ____________________________________________   FINAL CLINICAL IMPRESSION(S) / ED DIAGNOSES  Symptomatic anemia   Harvest Dark, MD 04/17/19 1746

## 2019-04-18 DIAGNOSIS — K921 Melena: Secondary | ICD-10-CM

## 2019-04-18 DIAGNOSIS — D509 Iron deficiency anemia, unspecified: Principal | ICD-10-CM

## 2019-04-18 DIAGNOSIS — R42 Dizziness and giddiness: Secondary | ICD-10-CM

## 2019-04-18 DIAGNOSIS — D62 Acute posthemorrhagic anemia: Secondary | ICD-10-CM

## 2019-04-18 DIAGNOSIS — Z86718 Personal history of other venous thrombosis and embolism: Secondary | ICD-10-CM

## 2019-04-18 DIAGNOSIS — Z7901 Long term (current) use of anticoagulants: Secondary | ICD-10-CM

## 2019-04-18 LAB — CBC
HCT: 24.9 % — ABNORMAL LOW (ref 39.0–52.0)
Hemoglobin: 7.2 g/dL — ABNORMAL LOW (ref 13.0–17.0)
MCH: 21.8 pg — ABNORMAL LOW (ref 26.0–34.0)
MCHC: 28.9 g/dL — ABNORMAL LOW (ref 30.0–36.0)
MCV: 75.2 fL — ABNORMAL LOW (ref 80.0–100.0)
Platelets: 179 10*3/uL (ref 150–400)
RBC: 3.31 MIL/uL — ABNORMAL LOW (ref 4.22–5.81)
RDW: 21.2 % — ABNORMAL HIGH (ref 11.5–15.5)
WBC: 5.5 10*3/uL (ref 4.0–10.5)
nRBC: 0 % (ref 0.0–0.2)

## 2019-04-18 LAB — BASIC METABOLIC PANEL
Anion gap: 7 (ref 5–15)
BUN: 33 mg/dL — ABNORMAL HIGH (ref 8–23)
CO2: 26 mmol/L (ref 22–32)
Calcium: 8.1 mg/dL — ABNORMAL LOW (ref 8.9–10.3)
Chloride: 110 mmol/L (ref 98–111)
Creatinine, Ser: 0.62 mg/dL (ref 0.61–1.24)
GFR calc Af Amer: >60 mL/min (ref >60)
GFR calc non Af Amer: >60 mL/min (ref >60)
Glucose, Bld: 116 mg/dL — ABNORMAL HIGH (ref 70–99)
Potassium: 4.3 mmol/L (ref 3.5–5.1)
Sodium: 143 mmol/L (ref 135–145)

## 2019-04-18 LAB — SARS CORONAVIRUS 2 (TAT 6-24 HRS): SARS Coronavirus 2: NEGATIVE

## 2019-04-18 LAB — GLUCOSE, CAPILLARY: Glucose-Capillary: 92 mg/dL (ref 70–99)

## 2019-04-18 MED ORDER — SODIUM CHLORIDE 0.9 % IV SOLN
300.0000 mg | Freq: Once | INTRAVENOUS | Status: AC
  Administered 2019-04-19: 300 mg via INTRAVENOUS
  Filled 2019-04-18: qty 15

## 2019-04-18 MED ORDER — SODIUM CHLORIDE 0.9 % IV SOLN
80.0000 mg | Freq: Once | INTRAVENOUS | Status: AC
  Administered 2019-04-18: 80 mg via INTRAVENOUS
  Filled 2019-04-18: qty 80

## 2019-04-18 MED ORDER — PANTOPRAZOLE SODIUM 40 MG IV SOLR: 40.0000 mg | Freq: Two times a day (BID) | INTRAVENOUS | Status: DC

## 2019-04-18 MED ORDER — SODIUM CHLORIDE 0.9 % IV SOLN
8.0000 mg/h | INTRAVENOUS | Status: DC
  Administered 2019-04-18 – 2019-04-20 (×5): 8 mg/h via INTRAVENOUS
  Filled 2019-04-18 (×4): qty 80

## 2019-04-18 NOTE — Consult Note (Signed)
Cephas Darby, MD [address]  [place], [place] [address]  Main: [phone]  Fax: [phone] Pager: [phone]   Consultation  Referring Provider:     No ref. provider found Primary Care Physician:  Idelle Crouch, MD Primary Gastroenterologist:  Dr. Tiffany Kocher       Reason for Consultation: Acute symptomatic anemia, melena  Date of Admission:  04/17/2019 Date of Consultation:  04/18/2019         HPI:   Marcus Coleman is a 79 y.o. male with history of chronic back pain, past history of NSAID use, chronic iron deficiency anemia who is followed by [name] for iron therapy.  Patient saw Dr. Doy Hutching' PA yesterday for shortness of breath, weakness.  He said he was feeling short winded for the last 2 to 3 weeks.  Labs were done which revealed severe anemia, hemoglobin 5.6.  Therefore, he was told to go to the ER.  His BUN/creatinine was elevated 38/ 0.7.  Baseline 19/0.7 in 11/2018.  His last hemoglobin was 11.5 on 09/15/2018, normal MCV.  Patient noticed black tarry stools today.  He is recently diagnosed with acute right lower extremity DVT, thrombophlebitis of superficial veins of right lower extremity by Dr. Leotis Pain, started on Eliquis on 03/30/2019 Patient's wife is a retired Marine scientist who is also bedside.  Patient has been drinking alcohol anywhere from 2 glass of wine at night to a bottle of liquor daily in last few weeks.  He denies abdominal pain, nausea or vomiting.  He had 2 black tarry bowel movements today Patient received 2 units of PRBCs and responded appropriately, improved to 7.2, has severe iron deficiency as well as B12 deficiency  Patient was worked up by Dr. Vira Agar for iron deficiency anemia, had a normal colonoscopy in 2018.  Patient reports that he also underwent upper endoscopy as well as video capsule endoscopy several years ago when he was initially diagnosed with iron deficiency anemia and he was told that he had erosions in his small bowel.  At  that time he was taking NSAIDs regularly for chronic back pain.  He denies any currently  NSAIDs: None  Antiplts/Anticoagulants/Anti thrombotics: Eliquis for recent DVT, last dose on 04/17/2019 morning  GI Procedures: Colonoscopy by Dr. Vira Agar 06/26/2016 - One small polyp in the sigmoid colon, removed with a cold snare. Resected and retrieved. - One 10 mm polyp in the ascending colon, removed with a hot snare. Resected and retrieved. - Three diminutive polyps in the rectum, removed with a jumbo cold forceps. Resected and retrieved. - Internal hemorrhoids.  DIAGNOSIS:  A. COLON POLYP, SIGMOID; COLD SNARE:  - TUBULAR ADENOMA.  - NEGATIVE FOR HIGH GRADE DYSPLASIA AND MALIGNANCY.   B. COLON POLYP, ASCENDING; HOT SNARE:  - SESSILE SERRATED ADENOMA.  - NEGATIVE FOR HIGH-GRADE DYSPLASIA AND MALIGNANCY.   C. COLON POLYP 3, RECTOSIGMOID; COLD BIOPSY:  - HYPERPLASTIC POLYP (3).  - NEGATIVE FOR DYSPLASIA AND MALIGNANCY.    Past Medical History:  Diagnosis Date  . Anemia   . Chronic diarrhea   . DDD (degenerative disc disease), cervical   . Hyperlipidemia   . Hypertension    borderline  . IDA (iron deficiency anemia)   . Low testosterone   . Vasovagal syncope     Past Surgical History:  Procedure Laterality Date  . CARDIAC CATHETERIZATION    . catracts Bilateral   . CHOLECYSTECTOMY    . COLONOSCOPY WITH PROPOFOL N/A 06/26/2016  Procedure: COLONOSCOPY WITH PROPOFOL;  Surgeon: Manya Silvas, MD;  Location: [facility] ENDOSCOPY;  Service: Endoscopy;  Laterality: N/A;  . ENDOSCOPIC RETROGRADE CHOLANGIOPANCREATOGRAPHY (ERCP) WITH PROPOFOL    . eyelid surgery    . FOOT SURGERY    . HEMORROIDECTOMY    . HERNIA REPAIR    . mrercp    . RHINOPLASTY    . TONSILLECTOMY    . WISDOM TOOTH EXTRACTION      Prior to Admission medications   Medication Sig Start Date End Date Taking? Authorizing Provider  Apixaban Starter Pack (ELIQUIS DVT/PE STARTER PACK) 5 MG TBPK Take as directed on  package: start with two-5mg  tablets twice daily for 7 days. On day 8, switch to one-5mg  tablet twice daily. 03/31/19  Yes Kris Hartmann, NP  cetirizine (ZYRTEC) 10 MG tablet Take 10 mg by mouth daily.   Yes [provider]  ferrous sulfate 325 (65 FE) MG tablet Take 325 mg by mouth 2 (two) times daily with a meal.    Yes [provider]  Multiple Vitamin (MULTIVITAMIN) tablet Take 1 tablet by mouth daily.   Yes [provider]  tamsulosin (FLOMAX) 0.4 MG CAPS capsule Take 1 capsule (0.4 mg total) by mouth daily after breakfast. 11/06/18  Yes Stoioff, Scott C, MD  timolol (TIMOPTIC) 0.5 % ophthalmic solution Place 1 drop into both eyes daily.  11/21/18  Yes [provider]  vitamin B-12 (CYANOCOBALAMIN) 500 MCG tablet Take 1,000 mcg by mouth daily.    Yes [provider]  vitamin C (ASCORBIC ACID) 500 MG tablet Take 1,000 mg by mouth daily.    Yes [provider]  acetaminophen (TYLENOL) 325 MG tablet Take 650 mg by mouth every 6 (six) hours as needed.    [provider]    Current Facility-Administered Medications:  .  0.9 %  sodium chloride infusion (Manually program via Guardrails IV Fluids), , Intravenous, Once, Manuella Ghazi, Vipul, MD .  0.9 %  sodium chloride infusion, , Intravenous, Continuous, Max Sane, MD, Last Rate: 75 mL/hr at 04/18/19 1717, Rate Verify at 04/18/19 1717 .  acetaminophen (TYLENOL) tablet 650 mg, 650 mg, Oral, Q6H PRN **OR** acetaminophen (TYLENOL) suppository 650 mg, 650 mg, Rectal, Q6H PRN, Manuella Ghazi, Vipul, MD .  ascorbic acid (VITAMIN C) tablet 500 mg, 500 mg, Oral, Daily, Manuella Ghazi, Vipul, MD, 500 mg at 04/18/19 1103 .  bisacodyl (DULCOLAX) EC tablet 5 mg, 5 mg, Oral, Daily PRN, Manuella Ghazi, Vipul, MD .  docusate sodium (COLACE) capsule 100 mg, 100 mg, Oral, BID, Max Sane, MD, 100 mg at 04/17/19 2218 .  HYDROcodone-acetaminophen (NORCO/VICODIN) 5-325 MG per tablet 1-2 tablet, 1-2 tablet, Oral, Q4H PRN, Max Sane, MD .   Derrill Memo ON 04/19/2019] iron sucrose (VENOFER) 300 mg in sodium chloride 0.9 % 250 mL IVPB, 300 mg, Intravenous, Once, Mirabel Ahlgren, Tally Due, MD .  loratadine (CLARITIN) tablet 10 mg, 10 mg, Oral, Daily, Manuella Ghazi, Vipul, MD, 10 mg at 04/18/19 1103 .  multivitamin with minerals tablet 1 tablet, 1 tablet, Oral, Daily, Max Sane, MD, 1 tablet at 04/18/19 1103 .  ondansetron (ZOFRAN) tablet 4 mg, 4 mg, Oral, Q6H PRN **OR** ondansetron (ZOFRAN) injection 4 mg, 4 mg, Intravenous, Q6H PRN, Manuella Ghazi, Vipul, MD .  pantoprazole (PROTONIX) 80 mg in sodium chloride 0.9 % 250 mL (0.32 mg/mL) infusion, 8 mg/hr, Intravenous, Continuous, Keani Gotcher, Tally Due, MD, Last Rate: 25 mL/hr at 04/18/19 1737, 8 mg/hr at 04/18/19 1737 .  [START ON 04/22/2019] pantoprazole (PROTONIX) injection 40 mg, 40  mg, Intravenous, Q12H, Zakira Ressel, Tally Due, MD .  tamsulosin [facility]) capsule 0.4 mg, 0.4 mg, Oral, QPC breakfast, Manuella Ghazi, Vipul, MD, 0.4 mg at 04/18/19 1102 .  timolol (TIMOPTIC) 0.5 % ophthalmic solution 1 drop, 1 drop, Both Eyes, Daily, Manuella Ghazi, Vipul, MD, 1 drop at 04/18/19 1104 .  traZODone (DESYREL) tablet 25 mg, 25 mg, Oral, QHS PRN, Max Sane, MD .  vitamin B-12 (CYANOCOBALAMIN) tablet 500 mcg, 500 mcg, Oral, Daily, Max Sane, MD, 500 mcg at 04/18/19 1103  Family History  Problem Relation Age of Onset  . Hypothyroidism Mother   . Diabetes Mother   . Lung cancer Mother      Social History   Tobacco Use  . Smoking status: Former Smoker    Packs/day: 1.00    Years: 25.00    Pack years: 25.00    Types: Cigarettes    Quit date: 12/05/1986    Years since quitting: 32.3  . Smokeless tobacco: Never Used  Substance Use Topics  . Alcohol use: Yes    Alcohol/week: 14.0 standard drinks    Types: 14 Cans of beer per week    Comment: 2 beers per day  . Drug use: No    Allergies as of 04/17/2019 - Review Complete 04/17/2019  Allergen Reaction Noted  . Nsaids  06/23/2016    Review of Systems:    All systems reviewed and  negative except where noted in HPI.   Physical Exam:  Vital signs in last 24 hours: Temp:  [97.7 F (36.5 C)-98.7 F (37.1 C)] 97.7 F (36.5 C) (02/05 1205) Pulse Rate:  [67-79] 67 (02/05 1205) Resp:  [17-20] 20 (02/05 0520) BP: (127-146)/(49-82) 137/82 (02/05 1205) SpO2:  [88 %-100 %] 97 % (02/05 1205) Weight:  [94.3 kg] 94.3 kg (02/04 2039) Last BM Date: 04/16/19 General:   Pleasant, cooperative in NAD Head:  Normocephalic and atraumatic. Eyes:   No icterus.   Conjunctiva pale. PERRLA. Ears:  Normal auditory acuity. Neck:  Supple; no masses or thyroidomegaly Lungs: Respirations even and unlabored. Lungs clear to auscultation bilaterally.   No wheezes, crackles, or rhonchi.  Heart:  Regular rate and rhythm;  Without murmur, clicks, rubs or gallops Abdomen:  Soft, obese, nondistended, nontender. Normal bowel sounds. No appreciable masses or hepatomegaly.  No rebound or guarding.  Rectal:  Not performed. Msk:  Symmetrical without gross deformities.  Strength normal Extremities: Trace edema, no cyanosis or clubbing. Neurologic:  Alert and oriented x3;  grossly normal neurologically. Skin:  Intact without significant lesions or rashes. Psych:  Alert and cooperative. Normal affect.  LAB RESULTS: CBC Latest Ref Rng & Units 04/18/2019 04/17/2019 01/16/2019  WBC 4.0 - 10.5 K/uL 5.5 5.4 5.7  Hemoglobin 13.0 - 17.0 g/dL 7.2(L) 5.9(L) 11.5(L)  Hematocrit 39.0 - 52.0 % 24.9(L) 21.4(L) 36.1(L)  Platelets 150 - 400 K/uL 179 232 164    BMET BMP Latest Ref Rng & Units 04/18/2019 04/17/2019  Glucose 70 - 99 mg/dL 116(H) 118(H)  BUN 8 - 23 mg/dL 33(H) 38(H)  Creatinine 0.61 - 1.24 mg/dL 0.62 0.82  Sodium 135 - 145 mmol/L 143 140  Potassium 3.5 - 5.1 mmol/L 4.3 3.7  Chloride 98 - 111 mmol/L 110 106  CO2 22 - 32 mmol/L 26 26  Calcium 8.9 - 10.3 mg/dL 8.1(L) 8.4(L)    LFT No flowsheet data found.   STUDIES: No results found.    Impression / Plan:   Marcus Coleman is a 79 y.o. male with  obesity, chronic back pain,  chronic iron deficiency anemia with previous work-up negative in the past, recent acute right lower extremity DVT and thrombophlebitis on Eliquis since 03/30/2019 presents with severe symptomatic anemia and melena  Melena and acute on chronic iron deficiency anemia: Elevated BUN/creatinine suggests upper GI bleed in setting of anticoagulation Recommend pantoprazole drip IV fluids and blood transfusion as needed Continue to hold Eliquis, last dose on 04/17/2019 AM Recommend upper endoscopy for further evaluation and patient is agreeable, tentative plan to perform tomorrow N.p.o. past midnight Discussed with patient that if upper endoscopy does not reveal any source of bleeding he will need colonoscopy +/- VCE Recommend parenteral iron therapy Recommend B12 1000MCG daily Abstinence from alcohol use Both patient and his wife expressed understanding of the plan  Thank you for involving me in the care of this patient.      LOS: 1 day   Sherri Sear, MD  04/18/2019, 5:56 PM   Note: This dictation was prepared with Dragon dictation along with smaller phrase technology. Any transcriptional errors that result from this process are unintentional.

## 2019-04-18 NOTE — Progress Notes (Signed)
[facility] NAME: Marcus Coleman    MR#:  [id number]  DATE OF BIRTH:  Jul 18, 1940  SUBJECTIVE:  CHIEF COMPLAINT:   Chief Complaint  Patient presents with  . Abnormal Lab  no complaints, pleasant and appreciate of care by everyone here REVIEW OF SYSTEMS:  Review of Systems  Constitutional: Positive for malaise/fatigue. Negative for diaphoresis, fever and weight loss.  HENT: Negative for ear discharge, ear pain, hearing loss, nosebleeds, sore throat and tinnitus.   Eyes: Negative for blurred vision and pain.  Respiratory: Negative for cough, hemoptysis, shortness of breath and wheezing.   Cardiovascular: Negative for chest pain, palpitations, orthopnea and leg swelling.  Gastrointestinal: Negative for abdominal pain, blood in stool, constipation, diarrhea, heartburn, nausea and vomiting.  Genitourinary: Negative for dysuria, frequency and urgency.  Musculoskeletal: Negative for back pain and myalgias.  Skin: Negative for itching and rash.  Neurological: Positive for dizziness. Negative for tingling, tremors, focal weakness, seizures, weakness and headaches.  Psychiatric/Behavioral: Negative for depression. The patient is not nervous/anxious.     DRUG ALLERGIES:   Allergies  Allergen Reactions  . Nsaids     Can take Ibuprofen in moderation/high doses or prolonged use causes GI bleed   VITALS:  Blood pressure 119/71, pulse 73, temperature 97.9 F (36.6 C), temperature source Oral, resp. rate 16, height 5\' 6"  (1.676 m), weight 94.3 kg, SpO2 96 %. PHYSICAL EXAMINATION:  Physical Exam HENT:     Head: Normocephalic and atraumatic.  Eyes:     Conjunctiva/sclera: Conjunctivae normal.     Pupils: Pupils are equal, round, and reactive to light.  Neck:     Thyroid: No thyromegaly.     Trachea: No tracheal deviation.  Cardiovascular:     Rate and Rhythm: Normal rate and regular rhythm.     Heart sounds: Normal heart sounds.  Pulmonary:     Effort:  Pulmonary effort is normal. No respiratory distress.     Breath sounds: Normal breath sounds. No wheezing.  Chest:     Chest wall: No tenderness.  Abdominal:     General: Bowel sounds are normal. There is no distension.     Palpations: Abdomen is soft.     Tenderness: There is no abdominal tenderness.  Musculoskeletal:        General: Normal range of motion.     Cervical back: Normal range of motion and neck supple.  Skin:    General: Skin is warm and dry.     Findings: No rash.  Neurological:     Mental Status: He is alert and oriented to person, place, and time.     Cranial Nerves: No cranial nerve deficit.    LABORATORY PANEL:  Male CBC Recent Labs  Lab 04/18/19 0434  WBC 5.5  HGB 7.2*  HCT 24.9*  PLT 179   ------------------------------------------------------------------------------------------------------------------ Chemistries  Recent Labs  Lab 04/18/19 0434  NA 143  K 4.3  CL 110  CO2 26  GLUCOSE 116*  BUN 33*  CREATININE 0.62  CALCIUM 8.1*   RADIOLOGY:  No results found. ASSESSMENT AND PLAN:  87 y m with k/h/o HTN, iron defi anemia, HLD admitted for symptomatic anemia  * Symptomatic Anemia - Hb 5 at office, recheck here is 5.9 - guaiac neg in ED, possible upper GI bleed (slow) in the setting of NOAC/Eliquis and alcoholism - has known IDA (normally Hb in the range of 9-12) - recently started on eliquis in Mid-Jan  - reports neg previous  luminal eval few yrs ago - s/p 2 PRBC transfusion with appropriate response - Hb 7.2 this am - monitor CBC - GI & Onco input appreciated - hold eliquis for now, last dose on 2/4 - protonix 40 mg IV bid - GI planning EGD tomorrow - [facility] and has scheduled f/up with dr Marcus Coleman next friday - parenteral iron while here per GI - continue Vit B12   * Dizziness/chest tightness - likely due to severe anemia. Monitor symptoms  * DVT of RLE: on eliquis since mid-Jan Hold  eliquis     DVT prophylaxis: SCDs for now, was on eliquis till 2/4 Family Communication: none Disposition Plan:  Came from Home D/C back home  Barriers to DC - completion of GI w/up  All the records are reviewed and case discussed with Care Management/Social Worker. Management plans discussed with the patient, nursing and they are in agreement.  CODE STATUS: Full Code  TOTAL TIME TAKING CARE OF THIS PATIENT: 35 minutes.   More than 50% of the time was spent in counseling/coordination of care: YES  POSSIBLE D/C IN 1-2 DAYS, DEPENDING ON CLINICAL CONDITION.   Marcus Coleman M.D on 04/18/2019 at 8:56 PM  [facility]   CC: Primary care physician; Marcus Crouch, MD  Note: This dictation was prepared with Dragon dictation along with smaller phrase technology. Any transcriptional errors that result from this process are unintentional.

## 2019-04-18 NOTE — Consult Note (Signed)
[facility]  [phone] [phone]  ID: Marcus Coleman OB: Feb 22, 1941  MR#: [id number]  [id number]  Patient Care Team: Idelle Crouch, MD as PCP - General (Internal Medicine)  CHIEF COMPLAINT: Symptomatic anemia.  INTERVAL HISTORY: Patient is a 79 year old male with a longstanding history of iron deficiency anemia who recently presented to the emergency room with a hemoglobin of less than 6.0.  He reports increased weakness and fatigue over the past several weeks.  He denies any melena or hematochezia, but states his stool did appear black earlier today.  He recently started Eliquis in January for a lower extremity DVT.  He otherwise feels well.  He has no neurologic complaints.  He denies any recent fevers or illnesses.  He has a fair appetite, but denies weight loss.  He has no chest pain, shortness of breath, cough, or hemoptysis.  He denies any nausea, vomiting, constipation, or diarrhea.  He has no urinary complaints.  Patient offers no further specific complaints today.  REVIEW OF SYSTEMS:   Review of Systems  Constitutional: Negative.  Negative for fever, malaise/fatigue and weight loss.  Respiratory: Negative.  Negative for cough and shortness of breath.   Cardiovascular: Negative.  Negative for chest pain and leg swelling.  Gastrointestinal: Positive for melena. Negative for abdominal pain, blood in stool and heartburn.  Genitourinary: Negative.  Negative for hematuria.  Musculoskeletal: Negative.  Negative for back pain.  Skin: Negative.  Negative for rash.  Neurological: Negative.  Negative for dizziness, focal weakness, weakness and headaches.  Psychiatric/Behavioral: Negative.  The patient is not nervous/anxious.     As per HPI. Otherwise, a complete review of systems is negative.  PAST MEDICAL HISTORY: Past Medical History:  Diagnosis Date  . Anemia   . Chronic diarrhea   . DDD (degenerative disc disease), cervical   .  Hyperlipidemia   . Hypertension    borderline  . IDA (iron deficiency anemia)   . Low testosterone   . Vasovagal syncope     PAST SURGICAL HISTORY: Past Surgical History:  Procedure Laterality Date  . CARDIAC CATHETERIZATION    . catracts Bilateral   . CHOLECYSTECTOMY    . COLONOSCOPY WITH PROPOFOL N/A 06/26/2016   Procedure: COLONOSCOPY WITH PROPOFOL;  Surgeon: Manya Silvas, MD;  Location: [facility] ENDOSCOPY;  Service: Endoscopy;  Laterality: N/A;  . ENDOSCOPIC RETROGRADE CHOLANGIOPANCREATOGRAPHY (ERCP) WITH PROPOFOL    . eyelid surgery    . FOOT SURGERY    . HEMORROIDECTOMY    . HERNIA REPAIR    . mrercp    . RHINOPLASTY    . TONSILLECTOMY    . WISDOM TOOTH EXTRACTION      FAMILY HISTORY: Family History  Problem Relation Age of Onset  . Hypothyroidism Mother   . Diabetes Mother   . Lung cancer Mother     ADVANCED DIRECTIVES (Y/N):  @ADVDIR @  HEALTH MAINTENANCE: Social History   Tobacco Use  . Smoking status: Former Smoker    Packs/day: 1.00    Years: 25.00    Pack years: 25.00    Types: Cigarettes    Quit date: 12/05/1986    Years since quitting: 32.3  . Smokeless tobacco: Never Used  Substance Use Topics  . Alcohol use: Yes    Alcohol/week: 14.0 standard drinks    Types: 14 Cans of beer per week    Comment: 2 beers per day  . Drug use: No     Colonoscopy:  PAP:  Bone density:  Lipid panel:  Allergies  Allergen Reactions  . Nsaids     Can take Ibuprofen in moderation/high doses or prolonged use causes GI bleed    Current Facility-Administered Medications  Medication Dose Route Frequency Provider Last Rate Last Admin  . 0.9 %  sodium chloride infusion (Manually program via Guardrails IV Fluids)   Intravenous Once Max Sane, MD      . 0.9 %  sodium chloride infusion   Intravenous Continuous Max Sane, MD 75 mL/hr at 04/18/19 1159 New Bag at 04/18/19 1159  . acetaminophen (TYLENOL) tablet 650 mg  650 mg Oral Q6H PRN Max Sane, MD       Or   . acetaminophen (TYLENOL) suppository 650 mg  650 mg Rectal Q6H PRN Max Sane, MD      . ascorbic acid (VITAMIN C) tablet 500 mg  500 mg Oral Daily Manuella Ghazi, Vipul, MD   500 mg at 04/18/19 1103  . bisacodyl (DULCOLAX) EC tablet 5 mg  5 mg Oral Daily PRN Max Sane, MD      . docusate sodium (COLACE) capsule 100 mg  100 mg Oral BID Max Sane, MD   100 mg at 04/17/19 2218  . ferrous sulfate tablet 325 mg  325 mg Oral Q breakfast Max Sane, MD      . HYDROcodone-acetaminophen (NORCO/VICODIN) 5-325 MG per tablet 1-2 tablet  1-2 tablet Oral Q4H PRN Max Sane, MD      . loratadine (CLARITIN) tablet 10 mg  10 mg Oral Daily Max Sane, MD   10 mg at 04/18/19 1103  . multivitamin with minerals tablet 1 tablet  1 tablet Oral Daily Max Sane, MD   1 tablet at 04/18/19 1103  . ondansetron (ZOFRAN) tablet 4 mg  4 mg Oral Q6H PRN Max Sane, MD       Or  . ondansetron (ZOFRAN) injection 4 mg  4 mg Intravenous Q6H PRN Max Sane, MD      . tamsulosin (FLOMAX) capsule 0.4 mg  0.4 mg Oral QPC breakfast Max Sane, MD   0.4 mg at 04/18/19 1102  . timolol (TIMOPTIC) 0.5 % ophthalmic solution 1 drop  1 drop Both Eyes Daily Max Sane, MD   1 drop at 04/18/19 1104  . traZODone (DESYREL) tablet 25 mg  25 mg Oral QHS PRN Max Sane, MD      . vitamin B-12 (CYANOCOBALAMIN) tablet 500 mcg  500 mcg Oral Daily Max Sane, MD   500 mcg at 04/18/19 1103    OBJECTIVE: Vitals:   04/18/19 0520 04/18/19 1205  BP: (!) 127/55 137/82  Pulse: 70 67  Resp: 20   Temp: 97.9 F (36.6 C) 97.7 F (36.5 C)  SpO2: 96% 97%     Body mass index is 33.55 kg/m.    ECOG FS:1 - Symptomatic but completely ambulatory  General: Well-developed, well-nourished, no acute distress. Eyes: Pink conjunctiva, anicteric sclera. HEENT: Normocephalic, moist mucous membranes. Lungs: No audible wheezing or coughing. Heart: Regular rate and rhythm. Abdomen: Soft, nontender, no obvious distention. Musculoskeletal: No edema, cyanosis,  or clubbing. Neuro: Alert, answering all questions appropriately. Cranial nerves grossly intact. Skin: No rashes or petechiae noted. Psych: Normal affect. Lymphatics: No cervical, calvicular, axillary or inguinal LAD.   LAB RESULTS:  Lab Results  Component Value Date   NA 143 04/18/2019   K 4.3 04/18/2019   CL 110 04/18/2019   CO2 26 04/18/2019   GLUCOSE 116 (H) 04/18/2019   BUN 33 (H) 04/18/2019   CREATININE 0.62  04/18/2019   CALCIUM 8.1 (L) 04/18/2019   GFRNONAA >60 04/18/2019   GFRAA >60 04/18/2019    Lab Results  Component Value Date   WBC 5.5 04/18/2019   NEUTROABS 3.4 01/16/2019   HGB 7.2 (L) 04/18/2019   HCT 24.9 (L) 04/18/2019   MCV 75.2 (L) 04/18/2019   PLT 179 04/18/2019   Lab Results  Component Value Date   IRON 14 (L) 04/17/2019   TIBC 505 (H) 04/17/2019   IRONPCTSAT 3 (L) 04/17/2019   Lab Results  Component Value Date   FERRITIN 4 (L) 04/17/2019     STUDIES: VAS [place] LOWER EXTREMITY VENOUS REFLUX  Result Date: 04/01/2019  Lower Venous Reflux Study Indications: Swelling, and right x 2 weeks.  Comparison Study: 2016 and was negative for dvt right leg Performing Technologist: Concha [place] RVT  Examination Guidelines: A complete evaluation includes B-mode imaging, spectral Doppler, color Doppler, and power Doppler as needed of all accessible portions of each vessel. Bilateral testing is considered an integral part of a complete examination. Limited examinations for reoccurring indications may be performed as noted. The reflux portion of the exam is performed with the patient in reverse Trendelenburg. Significant venous reflux is defined as ?500 ms in the superficial venous system, and >1 second in the deep venous system.  +---------+---------------+---------+-----------+------------+-----------------+ RIGHT    CompressibilityPhasicitySpontaneityProperties  Thrombus Aging    +---------+---------------+---------+-----------+------------+-----------------+  CFV      Full           Yes      Yes                                      +---------+---------------+---------+-----------+------------+-----------------+ SFJ      Full                                                             +---------+---------------+---------+-----------+------------+-----------------+ FV Prox  Full                                                             +---------+---------------+---------+-----------+------------+-----------------+ FV Mid   Full           Yes      Yes                                      +---------+---------------+---------+-----------+------------+-----------------+ FV DistalFull                                                             +---------+---------------+---------+-----------+------------+-----------------+ PFV      Full           Yes      Yes                                      +---------+---------------+---------+-----------+------------+-----------------+  POP      Partial                            softly      Age Indeterminate                                             echogenic                     +---------+---------------+---------+-----------+------------+-----------------+ PTV      Full           Yes      Yes                                      +---------+---------------+---------+-----------+------------+-----------------+ PERO     Full           Yes      Yes                                      +---------+---------------+---------+-----------+------------+-----------------+ Gastroc  Full                                                             +---------+---------------+---------+-----------+------------+-----------------+ SSV      Partial                            softly      Age Indeterminate                                             echogenic                     +---------+---------------+---------+-----------+------------+-----------------+    +----+---------------+---------+-----------+----------+--------------+ LEFTCompressibilityPhasicitySpontaneityPropertiesThrombus Aging +----+---------------+---------+-----------+----------+--------------+ CFV Full           Yes      Yes                                 +----+---------------+---------+-----------+----------+--------------+ SFJ Full           Yes      Yes                                 +----+---------------+---------+-----------+----------+--------------+   Venous Reflux Times +--------------+------+-----------+------------+------------------------+ RIGHT         RefluxReflux TimeDiameter cmsComments                                Yes                                                  +--------------+------+-----------+------------+------------------------+  CFV            yes     1288                                         +--------------+------+-----------+------------+------------------------+ FV mid         yes     1071                                         +--------------+------+-----------+------------+------------------------+ GSV prox thigh                             prior ablation/stripping +--------------+------+-----------+------------+------------------------+  Summary: Right: Findings consistent with age indeterminate deep vein thrombosis involving the right popliteal vein. Findings consistent with age indeterminate superficial vein thrombosis involving the right small saphenous vein. There is softly echogenic thrombus  in the pop vein and lesser saphenous vein only with partial compression and recannalized flow seen in both vessels. Patient has experienced pain and swelling for 2 weeks. The right GSV is closed from proximal thigh distally from previous ablation. Left: No evidence of common femoral vein obstruction.  *See table(s) above for measurements and observations. Electronically signed by Leotis Pain MD on 04/01/2019 at 12:51:54 PM.     Final     ASSESSMENT: Symptomatic anemia.  PLAN:    1.  Symptomatic anemia: Patient has a longstanding history of iron deficiency anemia possibly from a GI source.  He denies any history of melena or hematochezia but did report his stools look black this morning.  He started Eliquis several weeks ago for a DVT which possibly could contribute to an underlying bleed.  Continue to transfuse to maintain hemoglobin greater than 7.0.  GI consult has been placed and is pending at time of dictation.  Patient most likely will need luminal evaluation in the near future.  Patient has an appointment in the cancer center on Friday, February 12.  He has been instructed to keep this as scheduled for laboratory work and hospital follow-up. 2.  DVT: Eliquis currently on hold secondary to possible GI bleed.  Appreciate consult, will follow.  Lloyd Huger, MD   04/18/2019 1:02 PM

## 2019-04-19 ENCOUNTER — Encounter
Admission: EM | Disposition: A | Discharge status: Home / Self Care | Payer: Self-pay | Source: Home / Self Care | Attending: Internal Medicine

## 2019-04-19 ENCOUNTER — Inpatient Hospital Stay: Payer: No Typology Code available for payment source | Admitting: Anesthesiology

## 2019-04-19 DIAGNOSIS — D649 Anemia, unspecified: Secondary | ICD-10-CM

## 2019-04-19 DIAGNOSIS — K259 Gastric ulcer, unspecified as acute or chronic, without hemorrhage or perforation: Secondary | ICD-10-CM

## 2019-04-19 DIAGNOSIS — K922 Gastrointestinal hemorrhage, unspecified: Secondary | ICD-10-CM

## 2019-04-19 HISTORY — PX: ESOPHAGOGASTRODUODENOSCOPY: SHX5428

## 2019-04-19 LAB — BASIC METABOLIC PANEL
Anion gap: 6 (ref 5–15)
BUN: 22 mg/dL (ref 8–23)
CO2: 26 mmol/L (ref 22–32)
Calcium: 8 mg/dL — ABNORMAL LOW (ref 8.9–10.3)
Chloride: 110 mmol/L (ref 98–111)
Creatinine, Ser: 0.65 mg/dL (ref 0.61–1.24)
GFR calc Af Amer: >60 mL/min (ref >60)
GFR calc non Af Amer: >60 mL/min (ref >60)
Glucose, Bld: 102 mg/dL — ABNORMAL HIGH (ref 70–99)
Potassium: 3.9 mmol/L (ref 3.5–5.1)
Sodium: 142 mmol/L (ref 135–145)

## 2019-04-19 LAB — CBC
HCT: 23.6 % — ABNORMAL LOW (ref 39.0–52.0)
Hemoglobin: 6.9 g/dL — ABNORMAL LOW (ref 13.0–17.0)
MCH: 22 pg — ABNORMAL LOW (ref 26.0–34.0)
MCHC: 29.2 g/dL — ABNORMAL LOW (ref 30.0–36.0)
MCV: 75.4 fL — ABNORMAL LOW (ref 80.0–100.0)
Platelets: 175 10*3/uL (ref 150–400)
RBC: 3.13 MIL/uL — ABNORMAL LOW (ref 4.22–5.81)
RDW: 22 % — ABNORMAL HIGH (ref 11.5–15.5)
WBC: 6.3 10*3/uL (ref 4.0–10.5)
nRBC: 0 % (ref 0.0–0.2)

## 2019-04-19 LAB — PREPARE RBC (CROSSMATCH)

## 2019-04-19 SURGERY — EGD (ESOPHAGOGASTRODUODENOSCOPY)
Anesthesia: General

## 2019-04-19 MED ORDER — SODIUM CHLORIDE 0.9% IV SOLUTION: Freq: Once | INTRAVENOUS | Status: AC

## 2019-04-19 MED ORDER — PROPOFOL 10 MG/ML IV BOLUS
INTRAVENOUS | Status: DC | PRN
  Administered 2019-04-19 (×4): 20 mg via INTRAVENOUS

## 2019-04-19 MED ORDER — LIDOCAINE HCL (CARDIAC) PF 100 MG/5ML IV SOSY
PREFILLED_SYRINGE | INTRAVENOUS | Status: DC | PRN
  Administered 2019-04-19 (×2): 100 mg via INTRATRACHEAL

## 2019-04-19 NOTE — Anesthesia Postprocedure Evaluation (Signed)
Anesthesia Post Note  Patient: Marcus Coleman  Procedure(s) Performed: ESOPHAGOGASTRODUODENOSCOPY (EGD) (N/A )  Patient location during evaluation: PACU Anesthesia Type: General Level of consciousness: awake and alert Pain management: pain level controlled Vital Signs Assessment: post-procedure vital signs reviewed and stable Respiratory status: spontaneous breathing, nonlabored ventilation, respiratory function stable and patient connected to nasal cannula oxygen Cardiovascular status: blood pressure returned to baseline and stable Postop Assessment: no apparent nausea or vomiting Anesthetic complications: no     Last Vitals:  Vitals:   04/19/19 1349 04/19/19 1359  BP: 98/74 133/69  Pulse: 71 68  Resp: 18 17  Temp: 36.8 C   SpO2: 97% 96%    Last Pain:  Vitals:   04/19/19 1359  TempSrc:   PainSc: 0-No pain                 Arita Miss

## 2019-04-19 NOTE — Op Note (Signed)
[facility] Patient Name: Marcus Coleman Procedure Date: 04/19/2019 11:07 AM MRN: [id number] Account #: [id number] Date of Birth: May 08, 1940 Admit Type: Inpatient Age: 79 Room: [facility] ENDO ROOM 4 Gender: Male Note Status: Finalized Procedure:             Upper GI endoscopy Indications:           Melena Providers:             [name] MD, MD Medicines:             Monitored Anesthesia Care Complications:         No immediate complications. Procedure:             Pre-Anesthesia Assessment:                        - Prior to the procedure, a History and Physical was                         performed, and patient medications, allergies and                         sensitivities were reviewed. The patient's tolerance                         of previous anesthesia was reviewed.                        - The risks and benefits of the procedure and the                         sedation options and risks were discussed with the                         patient. All questions were answered and informed                         consent was obtained.                        - After reviewing the risks and benefits, the patient                         was deemed in satisfactory condition to undergo the                         procedure.                        - ASA Grade Assessment: III - A patient with severe                         systemic disease.                        After obtaining informed consent, the endoscope was                         passed under direct vision. Throughout the procedure,  the patient's blood pressure, pulse, and oxygen                         saturations were monitored continuously. The Endoscope                         was introduced through the mouth, and advanced to the                         third part of duodenum. The upper GI endoscopy was                         accomplished with ease. The patient tolerated  the                         procedure well. Findings:      The esophagus was normal.      The examined duodenum was normal.      A large hiatal hernia was present.      One non-bleeding superficial gastric ulcer with a clean ulcer base       (Forrest Class III) was found in the gastric antrum. The lesion was 8 mm       in largest dimension.      The cardia and gastric fundus were normal on retroflexion. Impression:            - Normal esophagus.                        - Normal examined duodenum.                        - Large hiatal hernia.                        - Non-bleeding gastric ulcer with a clean ulcer base                         (Forrest Class III).                        - No specimens collected. Recommendation:        - Return patient to hospital ward for ongoing care.                        - Clear liquid diet today.                        - 1. Continue oral PPI                        2. H pylori stool antigen                        3. Monitor CBC if stable and no melena then ok to go                         home after 24 hours                        4. If Hb drops or has evidence of  melena then will                         need colonoscopy Procedure Code(s):     --- Professional ---                        [id number], Esophagogastroduodenoscopy, flexible,                         transoral; diagnostic, including collection of                         specimen(s) by brushing or washing, when performed                         (separate procedure) Diagnosis Code(s):     --- Professional ---                        K44.9, Diaphragmatic hernia without obstruction or                         gangrene                        K25.9, Gastric ulcer, unspecified as acute or chronic,                         without hemorrhage or perforation                        K92.1, Melena (includes Hematochezia) CPT copyright 2019 [employer]. All rights reserved. The codes documented  in this report are preliminary and upon coder review may  be revised to meet current compliance requirements. [name], MD [name] MD, MD 04/19/2019 1:39:23 PM This report has been signed electronically. Number of Addenda: 0 Note Initiated On: 04/19/2019 11:07 AM Estimated Blood Loss:  Estimated blood loss: none.      [facility]

## 2019-04-19 NOTE — Progress Notes (Signed)
PROGRESS NOTE    Marcus Coleman  [id number] DOB: 1940/03/23 DOA: 04/17/2019 PCP: Idelle Crouch, MD    Brief Narrative:    Marcus Coleman  is a 79 y.o. male with a known history of iron deficiency anemia, hypertension, hyperlipidemia, is being admitted for severe anemia. According to the patient over the past several weeks he has been feeling weak and dizzy at times. Patient was started Eliquis mid-January for lower extremity DVT. Hehas not noticed any black or bloody stool.  His PCP office ([facility]) have asked him to stop taking Eliquis today before coming down to the emergency department.  Patient presents to the ED after receiving a call from his PCP that his hemoglobin was 5. Patient states he has had issues with anemia in the past but it has never been that low. Labs from this fall show patient's hemoglobin at 12.   2/6: Hemoglobin 6.9 this morning.  Patient n.p.o. for endoscopy however receiving transfusion of packed red cells prior to procedure.    Assessment & Plan:   Active Problems:   Anemia  Symptomatic Anemia Suspected GI bleed - Hb 5 at office, recheck here is 5.9 - guaiac neg in ED, possible upper GI bleed (slow) in the setting of NOAC/Eliquis and alcoholism - has known IDA (normally Hb in the range of 9-12) - recently started on eliquis in [place]  - reports neg previous luminal eval few yrs ago - s/p 2 PRBC transfusion with appropriate response - Transfused additional 1 unit on 2/6 Plan: - hold eliquis for now, last dose on 2/4 - protonix 40 mg IV bid - GI planning EGD TODAY 2/6 - he gets IV iron at cancer center and has scheduled f/up with dr Grayland Ormond next friday - parenteral iron while here per GI - continue Vit B12  * Dizziness/chest tightness - likely due to severe anemia. Monitor symptoms  * DVT of RLE: on eliquis since mid-Jan Hold eliquis   DVT prophylaxis: SCDs Code Status: Full Family Communication: Wife on the phone at time  of my examination this morning Disposition Plan: Anticipate home, 24 hours pending stabilization of hemoglobin   Consultants:   GI  Oncology  Procedures:   EGD (04/19/2019)  Antimicrobials:  None   Subjective: Patient seen and examined No acute status changes overnight Hemoglobin 6.9 this morning, receiving 1 unit packed red cells N.p.o. for EGD today  Objective: Vitals:   04/19/19 0533 04/19/19 0956 04/19/19 1029 04/19/19 1248  BP: (!) 109/51 121/72 (!) 144/76 (!) 143/72  Pulse: 73 77 73 72  Resp:  16 18 19   Temp:  98.7 F (37.1 C) 98.3 F (36.8 C) 98 F (36.7 C)  TempSrc:  Oral Oral Oral  SpO2: 91% 93% 95% 95%  Weight:      Height:        Intake/Output Summary (Last 24 hours) at 04/19/2019 1302 Last data filed at 04/19/2019 1014 Gross per 24 hour  Intake 2283.25 ml  Output 150 ml  Net 2133.25 ml   Filed Weights   04/17/19 2039 04/19/19 0520  Weight: 94.3 kg 100.8 kg    Examination:  General exam: Appears calm and comfortable  Respiratory system: Clear to auscultation. Respiratory effort normal. Cardiovascular system: S1 & S2 heard, RRR. No JVD, murmurs, rubs, gallops or clicks. No pedal edema. Gastrointestinal system: Abdomen is nondistended, soft and nontender. No organomegaly or masses felt. Normal bowel sounds heard. Central nervous system: Alert and oriented. No focal neurological deficits. Extremities: Symmetric  5 x 5 power. Skin: No rashes, lesions or ulcers Psychiatry: Judgement and insight appear normal. Mood & affect appropriate.     Data Reviewed: I have personally reviewed following labs and imaging studies  CBC: Recent Labs  Lab 04/17/19 1442 04/18/19 0434 04/19/19 0514  WBC 5.4 5.5 6.3  HGB 5.9* 7.2* 6.9*  HCT 21.4* 24.9* 23.6*  MCV 71.1* 75.2* 75.4*  PLT 232 179 0000000   Basic Metabolic Panel: Recent Labs  Lab 04/17/19 1442 04/18/19 0434 04/19/19 0514  NA 140 143 142  K 3.7 4.3 3.9  CL 106 110 110  CO2 26 26 26   GLUCOSE  118* 116* 102*  BUN 38* 33* 22  CREATININE 0.82 0.62 0.65  CALCIUM 8.4* 8.1* 8.0*   GFR: Estimated Creatinine Clearance: 87.3 mL/min (by C-G formula based on SCr of 0.65 mg/dL). Liver Function Tests: No results for input(s): AST, ALT, ALKPHOS, BILITOT, PROT, ALBUMIN in the last 168 hours. No results for input(s): LIPASE, AMYLASE in the last 168 hours. No results for input(s): AMMONIA in the last 168 hours. Coagulation Profile: No results for input(s): INR, PROTIME in the last 168 hours. Cardiac Enzymes: No results for input(s): CKTOTAL, CKMB, CKMBINDEX, TROPONINI in the last 168 hours. BNP (last 3 results) No results for input(s): PROBNP in the last 8760 hours. HbA1C: No results for input(s): HGBA1C in the last 72 hours. CBG: Recent Labs  Lab 04/18/19 0746  GLUCAP 92   Lipid Profile: No results for input(s): CHOL, HDL, LDLCALC, TRIG, CHOLHDL, LDLDIRECT in the last 72 hours. Thyroid Function Tests: No results for input(s): TSH, T4TOTAL, FREET4, T3FREE, THYROIDAB in the last 72 hours. Anemia Panel: Recent Labs    04/17/19 1442 04/17/19 1748  VITAMINB12  --  215  FOLATE 8.8  --   FERRITIN 4*  --   TIBC 505*  --   IRON 14*  --   RETICCTPCT 2.6  --    Sepsis Labs: No results for input(s): PROCALCITON, LATICACIDVEN in the last 168 hours.  Recent Results (from the past 240 hour(s))  SARS CORONAVIRUS 2 (TAT 6-24 HRS) Nasopharyngeal Nasopharyngeal Swab     Status: None   Collection Time: 04/17/19  5:27 PM   Specimen: Nasopharyngeal Swab  Result Value Ref Range Status   SARS Coronavirus 2 NEGATIVE NEGATIVE Final    Comment: (NOTE) SARS-CoV-2 target nucleic acids are NOT DETECTED. The SARS-CoV-2 RNA is generally detectable in upper and lower respiratory specimens during the acute phase of infection. Negative results do not preclude SARS-CoV-2 infection, do not rule out co-infections with other pathogens, and should not be used as the sole basis for treatment or other  patient management decisions. Negative results must be combined with clinical observations, patient history, and epidemiological information. The expected result is Negative. Fact Sheet for Patients: SugarRoll.be Fact Sheet for Healthcare Providers: https://www.woods-mathews.com/ This test is not yet approved or cleared by the [place] FDA and  has been authorized for detection and/or diagnosis of SARS-CoV-2 by FDA under an Emergency Use Authorization (EUA). This EUA will remain  in effect (meaning this test can be used) for the duration of the COVID-19 declaration under Section 56 4(b)(1) of the Act, 21 U.S.C. section 360bbb-3(b)(1), unless the authorization is terminated or revoked sooner. Performed at [facility], [place] [address] [place], [place] [address]          Radiology Studies: No results found.      Scheduled Meds: . sodium chloride   Intravenous Once  .  sodium chloride   Intravenous Once  . vitamin C  500 mg Oral Daily  . docusate sodium  100 mg Oral BID  . loratadine  10 mg Oral Daily  . multivitamin with minerals  1 tablet Oral Daily  . [START ON 04/22/2019] pantoprazole  40 mg Intravenous Q12H  . tamsulosin  0.4 mg Oral QPC breakfast  . timolol  1 drop Both Eyes Daily  . vitamin B-12  500 mcg Oral Daily   Continuous Infusions: . sodium chloride 75 mL/hr at 04/19/19 0523  . iron sucrose    . pantoprozole (PROTONIX) infusion 8 mg/hr (04/19/19 0523)     LOS: 2 days    Time spent: 35 minutes    Sidney Ace, MD [facility] Pager [phone]  If 7PM-7AM, please contact night-coverage 04/19/2019, 1:02 PM

## 2019-04-19 NOTE — H&P (Signed)
[name], MD [address] [place], [place], [place], [address] [address], [place], [place], [place], [address] Phone: [phone]  Fax: [phone]  Primary Care Physician:  Idelle Crouch, MD   Pre-Procedure History & Physical: HPI:  Marcus Coleman is a 79 y.o. male is here for an endoscopy    Past Medical History:  Diagnosis Date  . Anemia   . Chronic diarrhea   . DDD (degenerative disc disease), cervical   . Hyperlipidemia   . Hypertension    borderline  . IDA (iron deficiency anemia)   . Low testosterone   . Vasovagal syncope     Past Surgical History:  Procedure Laterality Date  . CARDIAC CATHETERIZATION    . catracts Bilateral   . CHOLECYSTECTOMY    . COLONOSCOPY WITH PROPOFOL N/A 06/26/2016   Procedure: COLONOSCOPY WITH PROPOFOL;  Surgeon: Manya Silvas, MD;  Location: [facility] ENDOSCOPY;  Service: Endoscopy;  Laterality: N/A;  . ENDOSCOPIC RETROGRADE CHOLANGIOPANCREATOGRAPHY (ERCP) WITH PROPOFOL    . eyelid surgery    . FOOT SURGERY    . HEMORROIDECTOMY    . HERNIA REPAIR    . mrercp    . RHINOPLASTY    . TONSILLECTOMY    . WISDOM TOOTH EXTRACTION      Prior to Admission medications   Medication Sig Start Date End Date Taking? Authorizing Provider  Apixaban Starter Pack (ELIQUIS DVT/PE STARTER PACK) 5 MG TBPK Take as directed on package: start with two-5mg  tablets twice daily for 7 days. On day 8, switch to one-5mg  tablet twice daily. 03/31/19  Yes Kris Hartmann, NP  cetirizine (ZYRTEC) 10 MG tablet Take 10 mg by mouth daily.   Yes [provider]  ferrous sulfate 325 (65 FE) MG tablet Take 325 mg by mouth 2 (two) times daily with a meal.    Yes [provider]  Multiple Vitamin (MULTIVITAMIN) tablet Take 1 tablet by mouth daily.   Yes [provider]  tamsulosin (FLOMAX) 0.4 MG CAPS capsule Take 1 capsule (0.4 mg total) by mouth daily after breakfast. 11/06/18  Yes Stoioff, Scott C, MD  timolol (TIMOPTIC) 0.5 %  ophthalmic solution Place 1 drop into both eyes daily.  11/21/18  Yes [provider]  vitamin B-12 (CYANOCOBALAMIN) 500 MCG tablet Take 1,000 mcg by mouth daily.    Yes [provider]  vitamin C (ASCORBIC ACID) 500 MG tablet Take 1,000 mg by mouth daily.    Yes [provider]  acetaminophen (TYLENOL) 325 MG tablet Take 650 mg by mouth every 6 (six) hours as needed.    [provider]    Allergies as of 04/17/2019 - Review Complete 04/17/2019  Allergen Reaction Noted  . Nsaids  06/23/2016    Family History  Problem Relation Age of Onset  . Hypothyroidism Mother   . Diabetes Mother   . Lung cancer Mother     Social History   Socioeconomic History  . Marital status: Married    Spouse name: Not on file  . Number of children: Not on file  . Years of education: Not on file  . Highest education level: Not on file  Occupational History  . Not on file  Tobacco Use  . Smoking status: Former Smoker    Packs/day: 1.00    Years: 25.00    Pack years: 25.00    Types: Cigarettes    Quit date: 12/05/1986    Years since quitting: 32.3  .  Smokeless tobacco: Never Used  Substance and Sexual Activity  . Alcohol use: Yes    Alcohol/week: 14.0 standard drinks    Types: 14 Cans of beer per week    Comment: 2 beers per day  . Drug use: No  . Sexual activity: Not on file  Other Topics Concern  . Not on file  Social History Narrative  . Not on file   Social Determinants of Health   Financial Resource Strain:   . Difficulty of Paying Living Expenses: Not on file  Food Insecurity:   . Worried About Charity fundraiser in the Last Year: Not on file  . Ran Out of Food in the Last Year: Not on file  Transportation Needs:   . Lack of Transportation (Medical): Not on file  . Lack of Transportation (Non-Medical): Not on file  Physical Activity:   . Days of Exercise per Week: Not on file  . Minutes of Exercise per Session: Not on file  Stress:   .  Feeling of Stress : Not on file  Social Connections:   . Frequency of Communication with Friends and Family: Not on file  . Frequency of Social Gatherings with Friends and Family: Not on file  . Attends Religious Services: Not on file  . Active Member of Clubs or Organizations: Not on file  . Attends Archivist Meetings: Not on file  . Marital Status: Not on file  Intimate Partner Violence:   . Fear of Current or Ex-Partner: Not on file  . Emotionally Abused: Not on file  . Physically Abused: Not on file  . Sexually Abused: Not on file    Review of Systems: See HPI, otherwise negative ROS  Physical Exam: BP (!) 143/72 (BP Location: Right Arm)   Pulse 72   Temp 98 F (36.7 C) (Oral)   Resp 19   Ht 5\' 6"  (1.676 m)   Wt 100.8 kg   SpO2 95%   BMI 35.87 kg/m  General:   Alert,  pleasant and cooperative in NAD Head:  Normocephalic and atraumatic. Neck:  Supple; no masses or thyromegaly. Lungs:  Clear throughout to auscultation, normal respiratory effort.    Heart:  +S1, +S2, Regular rate and rhythm, No edema. Abdomen:  Soft, nontender and nondistended. Normal bowel sounds, without guarding, and without rebound.   Neurologic:  Alert and  oriented x4;  grossly normal neurologically.  Impression/Plan: Marcus Coleman is here for an endoscopy  to be performed for  evaluation of melena    Risks, benefits, limitations, and alternatives regarding endoscopy have been reviewed with the patient.  Questions have been answered.  All parties agreeable.   [name], MD  04/19/2019, 1:18 PM

## 2019-04-19 NOTE — Transfer of Care (Signed)
Immediate Anesthesia Transfer of Care Note  Patient: Marcus Coleman  Procedure(s) Performed: ESOPHAGOGASTRODUODENOSCOPY (EGD) (N/A )  Patient Location: Endoscopy Unit  Anesthesia Type:MAC  Level of Consciousness: awake, alert  and oriented  Airway & Oxygen Therapy: Patient Spontanous Breathing and Patient connected to nasal cannula oxygen  Post-op Assessment: Report given to RN and Post -op Vital signs reviewed and stable  Post vital signs: Reviewed and stable  Last Vitals:  Vitals Value Taken Time  BP 98/74 1350  Temp    Pulse 71 1350  Resp 16 1350  SpO2 96 1350    Last Pain:  Vitals:   04/19/19 1248  TempSrc: Oral  PainSc:          Complications: No apparent anesthesia complications

## 2019-04-19 NOTE — Anesthesia Preprocedure Evaluation (Signed)
Anesthesia Evaluation  Patient identified by MRN, date of birth, ID band Patient awake    Reviewed: Allergy & Precautions, NPO status , Patient's Chart, lab work & pertinent test results  History of Anesthesia Complications Negative for: history of anesthetic complications  Airway Mallampati: III  TM Distance: >3 FB Neck ROM: Full    Dental no notable dental hx. (+) Teeth Intact   Pulmonary neg pulmonary ROS, neg sleep apnea, neg COPD, Patient abstained from smoking.Not current smoker, former smoker,    Pulmonary exam normal breath sounds clear to auscultation       Cardiovascular Exercise Tolerance: Good METShypertension, Pt. on medications (-) CAD and (-) Past MI (-) dysrhythmias + Valvular Problems/Murmurs  Rhythm:Regular Rate:Normal - Systolic murmurs Patient has hx of murmur of unclear significance   Neuro/Psych negative neurological ROS  negative psych ROS   GI/Hepatic neg GERD  ,(+)     (-) substance abuse  ,   Endo/Other  neg diabetes  Renal/GU negative Renal ROS     Musculoskeletal   Abdominal   Peds  Hematology  (+) anemia ,   Anesthesia Other Findings Past Medical History: No date: Anemia No date: Chronic diarrhea No date: DDD (degenerative disc disease), cervical No date: Hyperlipidemia No date: Hypertension     Comment:  borderline No date: IDA (iron deficiency anemia) No date: Low testosterone No date: Vasovagal syncope  Reproductive/Obstetrics                             Anesthesia Physical Anesthesia Plan  ASA: II  Anesthesia Plan: General   Post-op Pain Management:    Induction: Intravenous  PONV Risk Score and Plan: 2 and Propofol infusion and TIVA  Airway Management Planned: Natural Airway and Nasal Cannula  Additional Equipment: None  Intra-op Plan:   Post-operative Plan:   Informed Consent: I have reviewed the patients History and Physical,  chart, labs and discussed the procedure including the risks, benefits and alternatives for the proposed anesthesia with the patient or authorized representative who has indicated his/her understanding and acceptance.     Dental advisory given  Plan Discussed with: CRNA and Surgeon  Anesthesia Plan Comments: (Discussed risks of anesthesia with patient, including PONV, possibility of conversion to GETA and its associated risks including sore throat, lip/dental damage. Rare risks discussed as well, such as cardiorespiratory sequelae. Patient understands.  Patient receiving 1 u PRBC for Hgb 6.9 this AM)        Anesthesia Quick Evaluation

## 2019-04-20 LAB — TYPE AND SCREEN
ABO/RH(D): A POS
Antibody Screen: NEGATIVE
Unit division: 0
Unit division: 0
Unit division: 0

## 2019-04-20 LAB — CBC WITH DIFFERENTIAL/PLATELET
Abs Immature Granulocytes: 0.04 10*3/uL (ref 0.00–0.07)
Basophils Absolute: 0 10*3/uL (ref 0.0–0.1)
Basophils Relative: 0 %
Eosinophils Absolute: 0.2 10*3/uL (ref 0.0–0.5)
Eosinophils Relative: 3 %
HCT: 27.5 % — ABNORMAL LOW (ref 39.0–52.0)
Hemoglobin: 8 g/dL — ABNORMAL LOW (ref 13.0–17.0)
Immature Granulocytes: 1 %
Lymphocytes Relative: 12 %
Lymphs Abs: 0.6 10*3/uL — ABNORMAL LOW (ref 0.7–4.0)
MCH: 22.4 pg — ABNORMAL LOW (ref 26.0–34.0)
MCHC: 29.1 g/dL — ABNORMAL LOW (ref 30.0–36.0)
MCV: 77 fL — ABNORMAL LOW (ref 80.0–100.0)
Monocytes Absolute: 0.7 10*3/uL (ref 0.1–1.0)
Monocytes Relative: 12 %
Neutro Abs: 4 10*3/uL (ref 1.7–7.7)
Neutrophils Relative %: 72 %
Platelets: 140 10*3/uL — ABNORMAL LOW (ref 150–400)
RBC: 3.57 MIL/uL — ABNORMAL LOW (ref 4.22–5.81)
RDW: 22.6 % — ABNORMAL HIGH (ref 11.5–15.5)
WBC: 5.5 10*3/uL (ref 4.0–10.5)
nRBC: 0 % (ref 0.0–0.2)

## 2019-04-20 LAB — BPAM RBC
Blood Product Expiration Date: 202102282359
Blood Product Expiration Date: 202103092359
Blood Product Expiration Date: 202103102359
ISSUE DATE / TIME: 202102042058
ISSUE DATE / TIME: 202102050003
ISSUE DATE / TIME: 202102060959
Unit Type and Rh: 6200
Unit Type and Rh: 6200
Unit Type and Rh: 6200

## 2019-04-20 LAB — HEMOGLOBIN: Hemoglobin: 8.1 g/dL — ABNORMAL LOW (ref 13.0–17.0)

## 2019-04-20 LAB — BASIC METABOLIC PANEL
Anion gap: 7 (ref 5–15)
BUN: 14 mg/dL (ref 8–23)
CO2: 24 mmol/L (ref 22–32)
Calcium: 8 mg/dL — ABNORMAL LOW (ref 8.9–10.3)
Chloride: 109 mmol/L (ref 98–111)
Creatinine, Ser: 0.69 mg/dL (ref 0.61–1.24)
GFR calc Af Amer: >60 mL/min (ref >60)
GFR calc non Af Amer: >60 mL/min (ref >60)
Glucose, Bld: 95 mg/dL (ref 70–99)
Potassium: 3.7 mmol/L (ref 3.5–5.1)
Sodium: 140 mmol/L (ref 135–145)

## 2019-04-20 LAB — GLUCOSE, CAPILLARY
Glucose-Capillary: 93 mg/dL (ref 70–99)
Glucose-Capillary: 113 mg/dL — ABNORMAL HIGH (ref 70–99)

## 2019-04-20 MED ORDER — SALINE SPRAY 0.65 % NA SOLN
1.0000 | NASAL | Status: DC | PRN
  Filled 2019-04-20: qty 44

## 2019-04-20 MED ORDER — PANTOPRAZOLE SODIUM 40 MG PO TBEC
40.0000 mg | DELAYED_RELEASE_TABLET | Freq: Two times a day (BID) | ORAL | Status: DC
  Administered 2019-04-20 – 2019-04-21 (×3): 40 mg via ORAL
  Filled 2019-04-20 (×3): qty 1

## 2019-04-20 MED ORDER — CYANOCOBALAMIN 1000 MCG/ML IJ SOLN
1000.0000 ug | Freq: Once | INTRAMUSCULAR | Status: AC
  Administered 2019-04-20: 1000 ug via INTRAMUSCULAR
  Filled 2019-04-20: qty 1

## 2019-04-20 MED ORDER — APIXABAN 5 MG PO TABS
5.0000 mg | ORAL_TABLET | Freq: Two times a day (BID) | ORAL | Status: DC
  Administered 2019-04-20 – 2019-04-21 (×3): 5 mg via ORAL
  Filled 2019-04-20 (×3): qty 1

## 2019-04-20 NOTE — Progress Notes (Signed)
PROGRESS NOTE    Marcus Coleman  [id number] DOB: 04/09/1940 DOA: 04/17/2019 PCP: Idelle Crouch, Marcus Coleman    Brief Narrative:    Marcus Coleman  is a 79 y.o. male with a known history of iron deficiency anemia, hypertension, hyperlipidemia, is being admitted for severe anemia. According to the patient over the past several weeks he has been feeling weak and dizzy at times. Patient was started Eliquis mid-January for lower extremity DVT. Hehas not noticed any black or bloody stool.  His PCP office ([facility]) have asked him to stop taking Eliquis today before coming down to the emergency department.  Patient presents to the ED after receiving a call from his PCP that his hemoglobin was 5. Patient states he has had issues with anemia in the past but it has never been that low. Labs from this fall show patient's hemoglobin at 12.   2/6: Hemoglobin 6.9 this morning.  Patient n.p.o. for endoscopy however receiving transfusion of packed red cells prior to procedure.  2/7: Status post EGD.  No bleeding vessels or ulcers noted on endoscopic evaluation.  Patient feels well this morning.  No bowel movement since last night.  Hemoglobin 8.0 after transfusion of 1 unit packed red cells yesterday.  Case discussed with gastroenterology.  We will plan to monitor in house overnight.  If hemoglobin stable and no overt rectal bleeding noted will resume Eliquis and plan on discharge tomorrow morning    Assessment & Plan:   Active Problems:   Anemia  Symptomatic Anemia Suspected GI bleed - Hb 5 at office, recheck here is 5.9 - guaiac neg in ED, possible upper GI bleed (slow) in the setting of NOAC/Eliquis and alcoholism - has known IDA (normally Hb in the range of 9-12) - recently started on eliquis in [place]  - reports neg previous luminal eval few yrs ago - s/p 2 PRBC transfusion with appropriate response - Transfused additional 1 unit on 2/6 -Status post EGD on 04/19/2019: No overt bleeding  noted Plan: Continue to hold Eliquis for now Check afternoon hemoglobin Monitor for BM B12 injections Resume p.o. iron on discharge Daily Prilosec 40 mg p.o. on discharge long-term If hemoglobin stable and no overt signs of bleeding noted patient can discharge tomorrow morning.  Can restart Eliquis at time of discharge.  Patient will follow up with own established gastroenterologist at cardiology clinic.  Outpatient follow-up for H. pylori stool antigen.  * Dizziness/chest tightness - likely due to severe anemia. Monitor symptoms  * DVT of RLE: on eliquis since mid-Jan Hold eliquis   DVT prophylaxis: SCDs Code Status: Full Family Communication: None today  disposition Plan: Plan for discharge home on 04/21/2019 if hemoglobin remained stable and no further melena.  Can restart Eliquis at time of discharge with close outpatient follow-up with gastroenterology and hematology   Consultants:   GI  Oncology  Procedures:   EGD (04/19/2019)  Antimicrobials:  None   Subjective: Patient seen and examined Status post EGD Hemoglobin stable at 8 this morning  Objective: Vitals:   04/19/19 1410 04/19/19 2022 04/20/19 0445 04/20/19 1221  BP: (!) 105/93 (!) 153/83 (!) 130/58 (!) 120/52  Pulse: 73 72 73 68  Resp: 18 20 16 18   Temp:  98.5 F (36.9 C) 97.9 F (36.6 C) 97.7 F (36.5 C)  TempSrc:  Oral  Oral  SpO2: 98% 95% 90% 93%  Weight:      Height:        Intake/Output Summary (Last 24  hours) at 04/20/2019 1302 Last data filed at 04/20/2019 0300 Gross per 24 hour  Intake 1402.82 ml  Output 0 ml  Net 1402.82 ml   Filed Weights   04/17/19 2039 04/19/19 0520  Weight: 94.3 kg 100.8 kg    Examination:  General exam: Appears calm and comfortable  Respiratory system: Clear to auscultation. Respiratory effort normal. Cardiovascular system: S1 & S2 heard, RRR. No JVD, murmurs, rubs, gallops or clicks. No pedal edema. Gastrointestinal system: Abdomen is nondistended, soft  and nontender. No organomegaly or masses felt. Normal bowel sounds heard. Central nervous system: Alert and oriented. No focal neurological deficits. Extremities: Symmetric 5 x 5 power. Skin: No rashes, lesions or ulcers Psychiatry: Judgement and insight appear normal. Mood & affect appropriate.     Data Reviewed: I have personally reviewed following labs and imaging studies  CBC: Recent Labs  Lab 04/17/19 1442 04/18/19 0434 04/19/19 0514 04/20/19 0627 04/20/19 1156  WBC 5.4 5.5 6.3 5.5  --   NEUTROABS  --   --   --  4.0  --   HGB 5.9* 7.2* 6.9* 8.0* 8.1*  HCT 21.4* 24.9* 23.6* 27.5*  --   MCV 71.1* 75.2* 75.4* 77.0*  --   PLT 232 179 175 140*  --    Basic Metabolic Panel: Recent Labs  Lab 04/17/19 1442 04/18/19 0434 04/19/19 0514 04/20/19 0627  NA 140 143 142 140  K 3.7 4.3 3.9 3.7  CL 106 110 110 109  CO2 26 26 26 24   GLUCOSE 118* 116* 102* 95  BUN 38* 33* 22 14  CREATININE 0.82 0.62 0.65 0.69  CALCIUM 8.4* 8.1* 8.0* 8.0*   GFR: Estimated Creatinine Clearance: 87.3 mL/min (by C-G formula based on SCr of 0.69 mg/dL). Liver Function Tests: No results for input(s): AST, ALT, ALKPHOS, BILITOT, PROT, ALBUMIN in the last 168 hours. No results for input(s): LIPASE, AMYLASE in the last 168 hours. No results for input(s): AMMONIA in the last 168 hours. Coagulation Profile: No results for input(s): INR, PROTIME in the last 168 hours. Cardiac Enzymes: No results for input(s): CKTOTAL, CKMB, CKMBINDEX, TROPONINI in the last 168 hours. BNP (last 3 results) No results for input(s): PROBNP in the last 8760 hours. HbA1C: No results for input(s): HGBA1C in the last 72 hours. CBG: Recent Labs  Lab 04/18/19 0746 04/20/19 0735  GLUCAP 92 93   Lipid Profile: No results for input(s): CHOL, HDL, LDLCALC, TRIG, CHOLHDL, LDLDIRECT in the last 72 hours. Thyroid Function Tests: No results for input(s): TSH, T4TOTAL, FREET4, T3FREE, THYROIDAB in the last 72 hours. Anemia  Panel: Recent Labs    04/17/19 1442 04/17/19 1748  VITAMINB12  --  215  FOLATE 8.8  --   FERRITIN 4*  --   TIBC 505*  --   IRON 14*  --   RETICCTPCT 2.6  --    Sepsis Labs: No results for input(s): PROCALCITON, LATICACIDVEN in the last 168 hours.  Recent Results (from the past 240 hour(s))  SARS CORONAVIRUS 2 (TAT 6-24 HRS) Nasopharyngeal Nasopharyngeal Swab     Status: None   Collection Time: 04/17/19  5:27 PM   Specimen: Nasopharyngeal Swab  Result Value Ref Range Status   SARS Coronavirus 2 NEGATIVE NEGATIVE Final    Comment: (NOTE) SARS-CoV-2 target nucleic acids are NOT DETECTED. The SARS-CoV-2 RNA is generally detectable in upper and lower respiratory specimens during the acute phase of infection. Negative results do not preclude SARS-CoV-2 infection, do not rule out co-infections with other pathogens,  and should not be used as the sole basis for treatment or other patient management decisions. Negative results must be combined with clinical observations, patient history, and epidemiological information. The expected result is Negative. Fact Sheet for Patients: SugarRoll.be Fact Sheet for Healthcare Providers: https://www.woods-mathews.com/ This test is not yet approved or cleared by the [place] FDA and  has been authorized for detection and/or diagnosis of SARS-CoV-2 by FDA under an Emergency Use Authorization (EUA). This EUA will remain  in effect (meaning this test can be used) for the duration of the COVID-19 declaration under Section 56 4(b)(1) of the Act, 21 U.S.C. section 360bbb-3(b)(1), unless the authorization is terminated or revoked sooner. Performed at [facility], [place] [address] [place], [place] [address]          Radiology Studies: No results found.      Scheduled Meds: . sodium chloride   Intravenous Once  . vitamin C  500 mg Oral Daily  . docusate sodium  100 mg Oral BID  .  loratadine  10 mg Oral Daily  . multivitamin with minerals  1 tablet Oral Daily  . pantoprazole  40 mg Oral BID  . tamsulosin  0.4 mg Oral QPC breakfast  . timolol  1 drop Both Eyes Daily  . vitamin B-12  500 mcg Oral Daily   Continuous Infusions:    LOS: 3 days    Time spent: 35 minutes    Marcus Ace, Marcus Coleman [facility] Pager [phone]  If 7PM-7AM, please contact night-coverage 04/20/2019, 1:02 PM

## 2019-04-20 NOTE — Progress Notes (Addendum)
Marcus [place] , MD [address], [place], [place], [place], [address] [address], [place], [place], [place], [address] Phone: [phone]  Fax: [phone]   [place] is being followed for melena  Day 2 of follow up   Subjective: No bowel movements since last night, wiped this am an no blood on toilet paper   Objective: Vital signs in last 24 hours: Vitals:   04/19/19 1359 04/19/19 1410 04/19/19 2022 04/20/19 0445  BP: 133/69 (!) 105/93 (!) 153/83 (!) 130/58  Pulse: 68 73 72 73  Resp: 17 18 20 16   Temp:   98.5 F (36.9 C) 97.9 F (36.6 C)  TempSrc:   Oral   SpO2: 96% 98% 95% 90%  Weight:      Height:       Weight change:   Intake/Output Summary (Last 24 hours) at 04/20/2019 1050 Last data filed at 04/20/2019 0300 Gross per 24 hour  Intake 1704.82 ml  Output 0 ml  Net 1704.82 ml     Exam: Heart:: Regular rate and rhythm, S1S2 present or without murmur or extra heart sounds Lungs: normal, clear to auscultation and clear to auscultation and percussion Abdomen: soft, nontender, normal bowel sounds   Lab Results: @LABTEST2 @ Micro Results: Recent Results (from the past 240 hour(s))  SARS CORONAVIRUS 2 (TAT 6-24 HRS) Nasopharyngeal Nasopharyngeal Swab     Status: None   Collection Time: 04/17/19  5:27 PM   Specimen: Nasopharyngeal Swab  Result Value Ref Range Status   SARS Coronavirus 2 NEGATIVE NEGATIVE Final    Comment: (NOTE) SARS-CoV-2 target nucleic acids are NOT DETECTED. The SARS-CoV-2 RNA is generally detectable in upper and lower respiratory specimens during the acute phase of infection. Negative results do not preclude SARS-CoV-2 infection, do not rule out co-infections with other pathogens, and should not be used as the sole basis for treatment or other patient management decisions. Negative results must be combined with clinical observations, patient history, and epidemiological information. The expected result is Negative. Fact Sheet for  Patients: SugarRoll.be Fact Sheet for Healthcare Providers: https://www.woods-mathews.com/ This test is not yet approved or cleared by the [place] FDA and  has been authorized for detection and/or diagnosis of SARS-CoV-2 by FDA under an Emergency Use Authorization (EUA). This EUA will remain  in effect (meaning this test can be used) for the duration of the COVID-19 declaration under Section 56 4(b)(1) of the Act, 21 U.S.C. section 360bbb-3(b)(1), unless the authorization is terminated or revoked sooner. Performed at [facility], [place] [address] [place], [place] [address]    Studies/Results: No results found. Medications: I have reviewed the patient's current medications. Scheduled Meds: . sodium chloride   Intravenous Once  . vitamin C  500 mg Oral Daily  . cyanocobalamin  1,000 mcg Intramuscular Once  . docusate sodium  100 mg Oral BID  . loratadine  10 mg Oral Daily  . multivitamin with minerals  1 tablet Oral Daily  . pantoprazole  40 mg Oral BID  . tamsulosin  0.4 mg Oral QPC breakfast  . timolol  1 drop Both Eyes Daily  . vitamin B-12  500 mcg Oral Daily   Continuous Infusions: PRN Meds:.acetaminophen **OR** acetaminophen, bisacodyl, HYDROcodone-acetaminophen, ondansetron **OR** ondansetron (ZOFRAN) IV, sodium chloride, traZODone   Assessment: Active Problems:   Anemia   [place] 79 y.o. male with a prior history of iron deficiency anemia previously evaluated by [facility] gastroenterology and no clear etiology noted.  Presents to  the hospital with melena.  EGD performed on 04/19/2019 showed a superficial clean-based ulcer of the stomach that was not bleeding.  No therapy needed to be performed.  Since then patient has not had any melena.  Not had a bowel movement today.  Feels well.  Denies any NSAID use.  Noted to have low B12 levels.  Plan: 1.  Advance diet as tolerated 2.  Monitor CBC. 3.  Monitor bowel  movements. 4.  Follow-up H. pylori stool antigen. 5.  If has no further melena and no drop in hemoglobin can go home tomorrow and follow-up with his gastroenterologist at [facility] gastroenterology Home he is already established with. 6.  He will need iron and B12 shots which can be followed up with the cancer center with [name]  7.  He needs to continue Prilosec 40 mg a day orally at discharge long-term    LOS: 3 days   Marcus Bellows, MD 04/20/2019, 10:50 AM

## 2019-04-20 NOTE — Significant Event (Signed)
Repeat hemoglobin 8.1.  Stable from prior.  Patient had brown stool.  Given her hemodynamic stability and concern for bleeding while on Eliquis will restart blood thinner medication today.  Plan to monitor in-house overnight and recheck hemoglobin in the morning.  If a.m. hemoglobin stable can discharge home with close follow-up with gastroenterology and hematology.  Ralene Muskrat MD

## 2019-04-21 ENCOUNTER — Encounter: Payer: Self-pay | Admitting: *Deleted

## 2019-04-21 DIAGNOSIS — D519 Vitamin B12 deficiency anemia, unspecified: Secondary | ICD-10-CM

## 2019-04-21 DIAGNOSIS — D518 Other vitamin B12 deficiency anemias: Secondary | ICD-10-CM

## 2019-04-21 DIAGNOSIS — K449 Diaphragmatic hernia without obstruction or gangrene: Secondary | ICD-10-CM

## 2019-04-21 LAB — GLUCOSE, CAPILLARY
Glucose-Capillary: 85 mg/dL (ref 70–99)
Glucose-Capillary: 93 mg/dL (ref 70–99)
Glucose-Capillary: 98 mg/dL (ref 70–99)

## 2019-04-21 LAB — BASIC METABOLIC PANEL
Anion gap: 6 (ref 5–15)
BUN: 16 mg/dL (ref 8–23)
CO2: 26 mmol/L (ref 22–32)
Calcium: 8 mg/dL — ABNORMAL LOW (ref 8.9–10.3)
Chloride: 106 mmol/L (ref 98–111)
Creatinine, Ser: 0.69 mg/dL (ref 0.61–1.24)
GFR calc Af Amer: >60 mL/min (ref >60)
GFR calc non Af Amer: >60 mL/min (ref >60)
Glucose, Bld: 112 mg/dL — ABNORMAL HIGH (ref 70–99)
Potassium: 3.4 mmol/L — ABNORMAL LOW (ref 3.5–5.1)
Sodium: 138 mmol/L (ref 135–145)

## 2019-04-21 LAB — CBC WITH DIFFERENTIAL/PLATELET
Abs Immature Granulocytes: 0.02 10*3/uL (ref 0.00–0.07)
Basophils Absolute: 0 10*3/uL (ref 0.0–0.1)
Basophils Relative: 1 %
Eosinophils Absolute: 0.2 10*3/uL (ref 0.0–0.5)
Eosinophils Relative: 4 %
HCT: 26.3 % — ABNORMAL LOW (ref 39.0–52.0)
Hemoglobin: 7.7 g/dL — ABNORMAL LOW (ref 13.0–17.0)
Immature Granulocytes: 0 %
Lymphocytes Relative: 17 %
Lymphs Abs: 0.9 10*3/uL (ref 0.7–4.0)
MCH: 22.6 pg — ABNORMAL LOW (ref 26.0–34.0)
MCHC: 29.3 g/dL — ABNORMAL LOW (ref 30.0–36.0)
MCV: 77.4 fL — ABNORMAL LOW (ref 80.0–100.0)
Monocytes Absolute: 0.6 10*3/uL (ref 0.1–1.0)
Monocytes Relative: 12 %
Neutro Abs: 3.6 10*3/uL (ref 1.7–7.7)
Neutrophils Relative %: 66 %
Platelets: 170 10*3/uL (ref 150–400)
RBC: 3.4 MIL/uL — ABNORMAL LOW (ref 4.22–5.81)
RDW: 22.9 % — ABNORMAL HIGH (ref 11.5–15.5)
WBC: 5.4 10*3/uL (ref 4.0–10.5)
nRBC: 0.4 % — ABNORMAL HIGH (ref 0.0–0.2)

## 2019-04-21 MED ORDER — DOCUSATE SODIUM 100 MG PO CAPS: 100.0000 mg | ORAL_CAPSULE | Freq: Two times a day (BID) | ORAL | 0 refills | Status: DC | PRN

## 2019-04-21 MED ORDER — PANTOPRAZOLE SODIUM 40 MG PO TBEC: 40.0000 mg | DELAYED_RELEASE_TABLET | Freq: Two times a day (BID) | ORAL | 0 refills | Status: DC

## 2019-04-21 NOTE — Discharge Summary (Addendum)
Physician Discharge Summary  [place] [id number] DOB: 03/01/1941 DOA: 04/17/2019  PCP: Idelle Crouch, MD  Admit date: 04/17/2019 Discharge date: 04/21/2019  Admitted From: Home Disposition: Home  Recommendations for Outpatient Follow-up:  1. Patient has appointment to see his hematologist on 2/12.  He needs CBC to be checked during the visit.   Home Health: None Equipment/Devices: None  Discharge Condition:Fair CODE STATUS: Full code Diet recommendation: Regular   Discharge Diagnoses:  Principal Problem: Symptomatic anemia   Active Problems: Gastric ulcer, nonbleeding   Iron deficiency anemia   Hiatal hernia   Hyperlipidemia Vitamin B12 deficiency Lower extremity DVT Alcohol abuse  Brief narrative/HPI 79 year old male with iron deficiency anemia, hypertension, hyperlipidemia with diagnosis of recent lower extremity DVT and started on Eliquis 2 weeks prior to admission was seen by PCP after he visited there feeling dizzy.  At the PCP office he was found to hemoglobin of 5 and sent to the ED.  (His last hemoglobin prior to this was 12).  Hospital course Symptomatic anemia  Suspected upper GI bleed . Cannot r/o alcoholic gastritis. .  Patient received 1 unit PRBC.  Underwent EGD which showed no bleeding vessel or ulcer.  Did show a large gastric ulcer with a clean ulcer base without any bleeding.  Also showed a large hiatal hernia.  Hemoglobin 7.7 this morning and no signs or symptoms of melena or bright red blood per rectum. Eliquis has been resumed post EGD and patient placed on twice daily PPI.  Resume iron supplement.  He has follow-up with his hematologist later this week and needs H&H to be checked.  Outpatient GI follow-up.  Follow H. pylori antigen sent during EGD.   Patient had colonoscopy in 2018 showing a sessile polyp in ascending colon and another small polyp in sigmoid colon.  Patient clinically stable and can be discharged home.  Instructed to call  his PCP or return to the ED if he has dizziness, chest pain, shortness of breath, near syncope/syncope, melena, bright red blood per rectum or hematemesis.  Active symptoms  Alcohol abuse Was informed by his wife today that patient drinks frequently.  Occasionally he finishes a whole bottle of wine and also drinks 1 or 2 beers during the day.  Patient could have alcoholic gastritis contributing to upper GI bleed.  Patient counseled on alcohol cessation.  No signs of withdrawal noted.  Vitamin B-12 deficiency Resume oral B12 supplement.  Level in 200s.  Patient reported getting B12 injections in the past and his hematologist may need to consider him placing back on B12 injections.  Right lower extremity DVT On Eliquis since past 3 weeks.  Resume.  Dizziness/chest tightness Secondary to symptomatic anemia.  Resolved.  Procedure: EGD Consults: GI ([name])  Family communication: Wife updated on the phone.  Discharge Instructions   Allergies as of 04/21/2019      Reactions   Nsaids    Can take Ibuprofen in moderation/high doses or prolonged use causes GI bleed      Medication List    TAKE these medications   acetaminophen 325 MG tablet Commonly known as: TYLENOL Take 650 mg by mouth every 6 (six) hours as needed.   cetirizine 10 MG tablet Commonly known as: ZYRTEC Take 10 mg by mouth daily.   docusate sodium 100 MG capsule Commonly known as: COLACE Take 1 capsule (100 mg total) by mouth 2 (two) times daily as needed for mild constipation.   Eliquis DVT/PE Starter Pack 5 MG Tbpk  Generic drug: Apixaban Starter Pack Take as directed on package: start with two-5mg  tablets twice daily for 7 days. On day 8, switch to one-5mg  tablet twice daily.   ferrous sulfate 325 (65 FE) MG tablet Take 325 mg by mouth 2 (two) times daily with a meal.   multivitamin tablet Take 1 tablet by mouth daily.   pantoprazole 40 MG tablet Commonly known as: PROTONIX Take 1 tablet (40 mg total)  by mouth 2 (two) times daily.   tamsulosin 0.4 MG Caps capsule Commonly known as: FLOMAX Take 1 capsule (0.4 mg total) by mouth daily after breakfast.   timolol 0.5 % ophthalmic solution Commonly known as: TIMOPTIC Place 1 drop into both eyes daily.   vitamin B-12 500 MCG tablet Commonly known as: CYANOCOBALAMIN Take 1,000 mcg by mouth daily.   vitamin C 500 MG tablet Commonly known as: ASCORBIC ACID Take 1,000 mg by mouth daily.      Follow-up Information    Idelle Crouch, MD Follow up in 2 week(s).   Specialty: Internal Medicine Contact information: [place] [address] [phone]        Lloyd Huger, MD Follow up on 04/25/2019.   Specialty: Oncology Contact information: [place] [address] [phone]          Allergies  Allergen Reactions  . Nsaids     Can take Ibuprofen in moderation/high doses or prolonged use causes GI bleed       Procedures/Studies: VAS [place] LOWER EXTREMITY VENOUS REFLUX  Result Date: 04/01/2019  Lower Venous Reflux Study Indications: Swelling, and right x 2 weeks.  Comparison Study: 2016 and was negative for dvt right leg Performing Technologist: Concha Norway RVT  Examination Guidelines: A complete evaluation includes B-mode imaging, spectral Doppler, color Doppler, and power Doppler as needed of all accessible portions of each vessel. Bilateral testing is considered an integral part of a complete examination. Limited examinations for reoccurring indications may be performed as noted. The reflux portion of the exam is performed with the patient in reverse Trendelenburg. Significant venous reflux is defined as ?500 ms in the superficial venous system, and >1 second in the deep venous system.  +---------+---------------+---------+-----------+------------+-----------------+ RIGHT    CompressibilityPhasicitySpontaneityProperties  Thrombus Aging     +---------+---------------+---------+-----------+------------+-----------------+ CFV      Full           Yes      Yes                                      +---------+---------------+---------+-----------+------------+-----------------+ SFJ      Full                                                             +---------+---------------+---------+-----------+------------+-----------------+ FV Prox  Full                                                             +---------+---------------+---------+-----------+------------+-----------------+ FV Mid   Full  Yes      Yes                                      +---------+---------------+---------+-----------+------------+-----------------+ FV DistalFull                                                             +---------+---------------+---------+-----------+------------+-----------------+ PFV      Full           Yes      Yes                                      +---------+---------------+---------+-----------+------------+-----------------+ POP      Partial                            softly      Age Indeterminate                                             echogenic                     +---------+---------------+---------+-----------+------------+-----------------+ PTV      Full           Yes      Yes                                      +---------+---------------+---------+-----------+------------+-----------------+ PERO     Full           Yes      Yes                                      +---------+---------------+---------+-----------+------------+-----------------+ Gastroc  Full                                                             +---------+---------------+---------+-----------+------------+-----------------+ SSV      Partial                            softly      Age Indeterminate                                             echogenic                      +---------+---------------+---------+-----------+------------+-----------------+   +----+---------------+---------+-----------+----------+--------------+ LEFTCompressibilityPhasicitySpontaneityPropertiesThrombus Aging +----+---------------+---------+-----------+----------+--------------+ CFV Full           Yes      Yes                                 +----+---------------+---------+-----------+----------+--------------+  SFJ Full           Yes      Yes                                 +----+---------------+---------+-----------+----------+--------------+   Venous Reflux Times +--------------+------+-----------+------------+------------------------+ RIGHT         RefluxReflux TimeDiameter cmsComments                                Yes                                                  +--------------+------+-----------+------------+------------------------+ CFV            yes     1288                                         +--------------+------+-----------+------------+------------------------+ FV mid         yes     1071                                         +--------------+------+-----------+------------+------------------------+ GSV prox thigh                             prior ablation/stripping +--------------+------+-----------+------------+------------------------+  Summary: Right: Findings consistent with age indeterminate deep vein thrombosis involving the right popliteal vein. Findings consistent with age indeterminate superficial vein thrombosis involving the right small saphenous vein. There is softly echogenic thrombus  in the pop vein and lesser saphenous vein only with partial compression and recannalized flow seen in both vessels. Patient has experienced pain and swelling for 2 weeks. The right GSV is closed from proximal thigh distally from previous ablation. Left: No evidence of common femoral vein obstruction.  *See table(s) above for measurements  and observations. Electronically signed by Leotis Pain MD on 04/01/2019 at 12:51:54 PM.    Final        Subjective: Denies any dizziness or chest tightness.  Has not had bowel movement today.  Discharge Exam: Vitals:   04/21/19 0521 04/21/19 1209  BP: 131/71 (!) 147/73  Pulse: 71 69  Resp: 18 15  Temp: 98.6 F (37 C) 97.8 F (36.6 C)  SpO2: 93% 92%   Vitals:   04/20/19 2040 04/21/19 0400 04/21/19 0521 04/21/19 1209  BP: (!) 144/75  131/71 (!) 147/73  Pulse: 81  71 69  Resp: 20  18 15   Temp: 99 F (37.2 C)  98.6 F (37 C) 97.8 F (36.6 C)  TempSrc: Oral  Oral Oral  SpO2: 95%  93% 92%  Weight:  103.9 kg    Height:  5\' 6"  (1.676 m)      General: Elderly male not in distress HEENT: Pallor present, moist mucosa, supple neck Chest: Clear CVs: Normal S1-S2 GI: Soft, nondistended, nontender Musculoskeletal: Warm, no edema    The results of significant diagnostics from this hospitalization (including imaging, microbiology, ancillary and laboratory) are listed below for reference.     Microbiology:  Recent Results (from the past 240 hour(s))  SARS CORONAVIRUS 2 (TAT 6-24 HRS) Nasopharyngeal Nasopharyngeal Swab     Status: None   Collection Time: 04/17/19  5:27 PM   Specimen: Nasopharyngeal Swab  Result Value Ref Range Status   SARS Coronavirus 2 NEGATIVE NEGATIVE Final    Comment: (NOTE) SARS-CoV-2 target nucleic acids are NOT DETECTED. The SARS-CoV-2 RNA is generally detectable in upper and lower respiratory specimens during the acute phase of infection. Negative results do not preclude SARS-CoV-2 infection, do not rule out co-infections with other pathogens, and should not be used as the sole basis for treatment or other patient management decisions. Negative results must be combined with clinical observations, patient history, and epidemiological information. The expected result is Negative. Fact Sheet for Patients: SugarRoll.be Fact  Sheet for Healthcare Providers: https://www.woods-mathews.com/ This test is not yet approved or cleared by the [place] FDA and  has been authorized for detection and/or diagnosis of SARS-CoV-2 by FDA under an Emergency Use Authorization (EUA). This EUA will remain  in effect (meaning this test can be used) for the duration of the COVID-19 declaration under Section 56 4(b)(1) of the Act, 21 U.S.C. section 360bbb-3(b)(1), unless the authorization is terminated or revoked sooner. Performed at [facility], [place] [address] [place], [place] [address]      Labs: BNP (last 3 results) No results for input(s): BNP in the last 8760 hours. Basic Metabolic Panel: Recent Labs  Lab 04/17/19 1442 04/18/19 0434 04/19/19 0514 04/20/19 0627 04/21/19 0621  NA 140 143 142 140 138  K 3.7 4.3 3.9 3.7 3.4*  CL 106 110 110 109 106  CO2 26 26 26 24 26   GLUCOSE 118* 116* 102* 95 112*  BUN 38* 33* 22 14 16   CREATININE 0.82 0.62 0.65 0.69 0.69  CALCIUM 8.4* 8.1* 8.0* 8.0* 8.0*   Liver Function Tests: No results for input(s): AST, ALT, ALKPHOS, BILITOT, PROT, ALBUMIN in the last 168 hours. No results for input(s): LIPASE, AMYLASE in the last 168 hours. No results for input(s): AMMONIA in the last 168 hours. CBC: Recent Labs  Lab 04/17/19 1442 04/17/19 1442 04/18/19 0434 04/19/19 0514 04/20/19 0627 04/20/19 1156 04/21/19 0621  WBC 5.4  --  5.5 6.3 5.5  --  5.4  NEUTROABS  --   --   --   --  4.0  --  3.6  HGB 5.9*   < > 7.2* 6.9* 8.0* 8.1* 7.7*  HCT 21.4*  --  24.9* 23.6* 27.5*  --  26.3*  MCV 71.1*  --  75.2* 75.4* 77.0*  --  77.4*  PLT 232  --  179 175 140*  --  170   < > = values in this interval not displayed.   Cardiac Enzymes: No results for input(s): CKTOTAL, CKMB, CKMBINDEX, TROPONINI in the last 168 hours. BNP: Invalid input(s): POCBNP CBG: Recent Labs  Lab 04/18/19 1204 04/19/19 0755 04/20/19 0735 04/20/19 1627 04/21/19 0754  GLUCAP 85 93 93 113*  98   D-Dimer No results for input(s): DDIMER in the last 72 hours. Hgb A1c No results for input(s): HGBA1C in the last 72 hours. Lipid Profile No results for input(s): CHOL, HDL, LDLCALC, TRIG, CHOLHDL, LDLDIRECT in the last 72 hours. Thyroid function studies No results for input(s): TSH, T4TOTAL, T3FREE, THYROIDAB in the last 72 hours.  Invalid input(s): FREET3 Anemia work up No results for input(s): VITAMINB12, FOLATE, FERRITIN, TIBC, IRON, RETICCTPCT in the last 72 hours. Urinalysis  Component Value Date/Time   COLORURINE YELLOW (A) 04/17/2019 1442   APPEARANCEUR CLEAR (A) 04/17/2019 1442   APPEARANCEUR Clear 09/28/2017 1422   LABSPEC 1.021 04/17/2019 1442   PHURINE 5.0 04/17/2019 1442   GLUCOSEU NEGATIVE 04/17/2019 1442   HGBUR NEGATIVE 04/17/2019 1442   BILIRUBINUR NEGATIVE 04/17/2019 1442   BILIRUBINUR Negative 09/28/2017 1422   KETONESUR NEGATIVE 04/17/2019 1442   PROTEINUR NEGATIVE 04/17/2019 1442   NITRITE NEGATIVE 04/17/2019 1442   LEUKOCYTESUR NEGATIVE 04/17/2019 1442   Sepsis Labs Invalid input(s): PROCALCITONIN,  WBC,  LACTICIDVEN Microbiology Recent Results (from the past 240 hour(s))  SARS CORONAVIRUS 2 (TAT 6-24 HRS) Nasopharyngeal Nasopharyngeal Swab     Status: None   Collection Time: 04/17/19  5:27 PM   Specimen: Nasopharyngeal Swab  Result Value Ref Range Status   SARS Coronavirus 2 NEGATIVE NEGATIVE Final    Comment: (NOTE) SARS-CoV-2 target nucleic acids are NOT DETECTED. The SARS-CoV-2 RNA is generally detectable in upper and lower respiratory specimens during the acute phase of infection. Negative results do not preclude SARS-CoV-2 infection, do not rule out co-infections with other pathogens, and should not be used as the sole basis for treatment or other patient management decisions. Negative results must be combined with clinical observations, patient history, and epidemiological information. The expected result is Negative. Fact Sheet  for Patients: SugarRoll.be Fact Sheet for Healthcare Providers: https://www.woods-mathews.com/ This test is not yet approved or cleared by the [place] FDA and  has been authorized for detection and/or diagnosis of SARS-CoV-2 by FDA under an Emergency Use Authorization (EUA). This EUA will remain  in effect (meaning this test can be used) for the duration of the COVID-19 declaration under Section 56 4(b)(1) of the Act, 21 U.S.C. section 360bbb-3(b)(1), unless the authorization is terminated or revoked sooner. Performed at [facility], [place] [address] [place], [place] [address]      Time coordinating discharge: 35 minutes  SIGNED:   Louellen Molder, MD  [facility] 04/21/2019, 12:44 PM Pager   If 7PM-7AM, please contact night-coverage [email or web address] Password [email or web address]

## 2019-04-21 NOTE — Discharge Instructions (Signed)
Anemia  Anemia is a condition in which you do not have enough red blood cells or hemoglobin. Hemoglobin is a substance in red blood cells that carries oxygen. When you do not have enough red blood cells or hemoglobin (are anemic), your body cannot get enough oxygen and your organs may not work properly. As a result, you may feel very tired or have other problems. What are the causes? Common causes of anemia include:  Excessive bleeding. Anemia can be caused by excessive bleeding inside or outside the body, including bleeding from the intestine or from periods in women.  Poor nutrition.  Long-lasting (chronic) kidney, thyroid, and liver disease.  Bone marrow disorders.  Cancer and treatments for cancer.  HIV (human immunodeficiency virus) and AIDS (acquired immunodeficiency syndrome).  Treatments for HIV and AIDS.  Spleen problems.  Blood disorders.  Infections, medicines, and autoimmune disorders that destroy red blood cells. What are the signs or symptoms? Symptoms of this condition include:  Minor weakness.  Dizziness.  Headache.  Feeling heartbeats that are irregular or faster than normal (palpitations).  Shortness of breath, especially with exercise.  Paleness.  Cold sensitivity.  Indigestion.  Nausea.  Difficulty sleeping.  Difficulty concentrating. Symptoms may occur suddenly or develop slowly. If your anemia is mild, you may not have symptoms. How is this diagnosed? This condition is diagnosed based on:  Blood tests.  Your medical history.  A physical exam.  Bone marrow biopsy. Your health care provider may also check your stool (feces) for blood and may do additional testing to look for the cause of your bleeding. You may also have other tests, including:  Imaging tests, such as a CT scan or MRI.  Endoscopy.  Colonoscopy. How is this treated? Treatment for this condition depends on the cause. If you continue to lose a lot of blood, you may  need to be treated at a hospital. Treatment may include:  Taking supplements of iron, vitamin S31, or folic acid.  Taking a hormone medicine (erythropoietin) that can help to stimulate red blood cell growth.  Having a blood transfusion. This may be needed if you lose a lot of blood.  Making changes to your diet.  Having surgery to remove your spleen. Follow these instructions at home:  Take over-the-counter and prescription medicines only as told by your health care provider.  Take supplements only as told by your health care provider.  Follow any diet instructions that you were given.  Keep all follow-up visits as told by your health care provider. This is important. Contact a health care provider if:  You develop new bleeding anywhere in the body. Get help right away if:  You are very weak.  You are short of breath.  You have pain in your abdomen or chest.  You are dizzy or feel faint.  You have trouble concentrating.  You have bloody or black, tarry stools.  You vomit repeatedly or you vomit up blood. Summary  Anemia is a condition in which you do not have enough red blood cells or enough of a substance in your red blood cells that carries oxygen (hemoglobin).  Symptoms may occur suddenly or develop slowly.  If your anemia is mild, you may not have symptoms.  This condition is diagnosed with blood tests as well as a medical history and physical exam. Other tests may be needed.  Treatment for this condition depends on the cause of the anemia. This information is not intended to replace advice given to you by  your health care provider. Make sure you discuss any questions you have with your health care provider. Document Revised: 02/09/2017 Document Reviewed: 03/31/2016 Elsevier Patient Education  [place].    Alcohol Use Disorder Alcohol use disorder is when your drinking disrupts your daily life. When you have this condition, you drink too much  alcohol and you cannot control your drinking. Alcohol use disorder can cause serious problems with your physical health. It can affect your brain, heart, liver, pancreas, immune system, stomach, and intestines. Alcohol use disorder can increase your risk for certain cancers and cause problems with your mental health, such as depression, anxiety, psychosis, delirium, and dementia. People with this disorder risk hurting themselves and others. What are the causes? This condition is caused by drinking too much alcohol over time. It is not caused by drinking too much alcohol only one or two times. Some people with this condition drink alcohol to cope with or escape from negative life events. Others drink to relieve pain or symptoms of mental illness. What increases the risk? You are more likely to develop this condition if:  You have a family history of alcohol use disorder.  Your culture encourages drinking to the point of intoxication, or makes alcohol easy to get.  You had a mood or conduct disorder in childhood.  You have been a victim of abuse.  You are an adolescent and: ? You have poor grades or difficulties in school. ? Your caregivers do not talk to you about saying no to alcohol, or supervise your activities. ? You are impulsive or you have trouble with self-control. What are the signs or symptoms? Symptoms of this condition include:  Drinkingmore than you want to.  Drinking for longer than you want to.  Trying several times to drink less or to control your drinking.  Spending a lot of time getting alcohol, drinking, or recovering from drinking.  Craving alcohol.  Having problems at work, at school, or at home due to drinking.  Having problems in relationships due to drinking.  Drinking when it is dangerous to drink, such as before driving a car.  Continuing to drink even though you know you might have a physical or mental problem related to drinking.  Needing more and  more alcohol to get the same effect you want from the alcohol (building up tolerance).  Having symptoms of withdrawal when you stop drinking. Symptoms of withdrawal include: ? Fatigue. ? Nightmares. ? Trouble sleeping. ? Depression. ? Anxiety. ? Fever. ? Seizures. ? Severe confusion. ? Feeling or seeing things that are not there (hallucinations). ? Tremors. ? Rapid heart rate. ? Rapid breathing. ? High blood pressure.  Drinking to avoid symptoms of withdrawal. How is this diagnosed? This condition is diagnosed with an assessment. Your health care provider may start the assessment by asking three or four questions about your drinking. Your health care provider may perform a physical exam or do lab tests to see if you have physical problems resulting from alcohol use. She or he may refer you to a mental health professional for evaluation. How is this treated? Some people with alcohol use disorder are able to reduce their alcohol use to low-risk levels. Others need to completely quit drinking alcohol. When necessary, mental health professionals with specialized training in substance use treatment can help. Your health care provider can help you decide how severe your alcohol use disorder is and what type of treatment you need. The following forms of treatment are available:  Detoxification. Detoxification involves quitting drinking and using prescription medicines within the first week to help lessen withdrawal symptoms. This treatment is important for people who have had withdrawal symptoms before and for heavy drinkers who are likely to have withdrawal symptoms. Alcohol withdrawal can be dangerous, and in severe cases, it can cause death. Detoxification may be provided in a home, community, or primary care setting, or in a hospital or substance use treatment facility.  Counseling. This treatment is also called talk therapy. It is provided by substance use treatment counselors. A counselor  can address the reasons you use alcohol and suggest ways to keep you from drinking again or to prevent problem drinking. The goals of talk therapy are to: ? Find healthy activities and ways for you to cope with stress. ? Identify and avoid the things that trigger your alcohol use. ? Help you learn how to handle cravings.  Medicines.Medicines can help treat alcohol use disorder by: ? Decreasing alcohol cravings. ? Decreasing the positive feeling you have when you drink alcohol. ? Causing an uncomfortable physical reaction when you drink alcohol (aversion therapy).  Support groups. Support groups are led by people who have quit drinking. They provide emotional support, advice, and guidance. These forms of treatment are often combined. Some people with this condition benefit from a combination of treatments provided by specialized substance use treatment centers. Follow these instructions at home:  Take over-the-counter and prescription medicines only as told by your health care provider.  Check with your health care provider before starting any new medicines.  Ask friends and family members not to offer you alcohol.  Avoid situations where alcohol is served, including gatherings where others are drinking alcohol.  Create a plan for what to do when you are tempted to use alcohol.  Find hobbies or activities that you enjoy that do not include alcohol.  Keep all follow-up visits as told by your health care provider. This is important. How is this prevented?  If you drink, limit alcohol intake to no more than 1 drink a day for nonpregnant women and 2 drinks a day for men. One drink equals 12 oz of beer, 5 oz of wine, or 1 oz of hard liquor.  If you have a mental health condition, get treatment and support.  Do not give alcohol to adolescents.  If you are an adolescent: ? Do not drink alcohol. ? Do not be afraid to say no if someone offers you alcohol. Speak up about why you do not  want to drink. You can be a positive role model for your friends and set a good example for those around you by not drinking alcohol. ? If your friends drink, spend time with others who do not drink alcohol. Make new friends who do not use alcohol. ? Find healthy ways to manage stress and emotions, such as meditation or deep breathing, exercise, spending time in nature, listening to music, or talking with a trusted friend or family member. Contact a health care provider if:  You are not able to take your medicines as told.  Your symptoms get worse.  You return to drinking alcohol (relapse) and your symptoms get worse. Get help right away if:  You have thoughts about hurting yourself or others. If you ever feel like you may hurt yourself or others, or have thoughts about taking your own life, get help right away. You can go to your nearest emergency department or call:  Your local emergency services (911 in the  [place]  A suicide crisis helpline, such as the [facility] at 210-476-0051. This is open 24 hours a day. Summary  Alcohol use disorder is when your drinking disrupts your daily life. When you have this condition, you drink too much alcohol and you cannot control your drinking.  Treatment may include detoxification, counseling, medicine, and support groups.  Ask friends and family members not to offer you alcohol. Avoid situations where alcohol is served.  Get help right away if you have thoughts about hurting yourself or others. This information is not intended to replace advice given to you by your health care provider. Make sure you discuss any questions you have with your health care provider. Document Revised: 02/09/2017 Document Reviewed: 11/25/2015 Elsevier Patient Education  [place].

## 2019-04-21 NOTE — Care Management Important Message (Signed)
Important Message  Patient Details  Name: Marcus Coleman MRN: [id number] Date of Birth: 05-20-1940   Medicare Important Message Given:  Yes     Dannette Barbara 04/21/2019, 11:24 AM

## 2019-04-23 ENCOUNTER — Encounter: Payer: Self-pay | Admitting: Gastroenterology

## 2019-04-23 DIAGNOSIS — D509 Iron deficiency anemia, unspecified: Secondary | ICD-10-CM | POA: Diagnosis not present

## 2019-04-23 DIAGNOSIS — K250 Acute gastric ulcer with hemorrhage: Secondary | ICD-10-CM | POA: Diagnosis not present

## 2019-04-23 LAB — H. PYLORI ANTIGEN, STOOL: H. Pylori Stool Ag, Eia: NEGATIVE

## 2019-04-24 ENCOUNTER — Other Ambulatory Visit: Payer: Self-pay | Admitting: Emergency Medicine

## 2019-04-24 DIAGNOSIS — D509 Iron deficiency anemia, unspecified: Secondary | ICD-10-CM

## 2019-04-25 ENCOUNTER — Encounter: Payer: Self-pay | Admitting: Oncology

## 2019-04-25 ENCOUNTER — Inpatient Hospital Stay: Payer: PPO | Attending: Oncology

## 2019-04-25 ENCOUNTER — Other Ambulatory Visit: Payer: Self-pay

## 2019-04-25 ENCOUNTER — Inpatient Hospital Stay: Payer: PPO

## 2019-04-25 ENCOUNTER — Inpatient Hospital Stay (HOSPITAL_BASED_OUTPATIENT_CLINIC_OR_DEPARTMENT_OTHER): Payer: PPO | Admitting: Oncology

## 2019-04-25 VITALS — BP 165/83 | HR 77 | Temp 96.0°F | Wt 228.0 lb

## 2019-04-25 VITALS — BP 137/86 | HR 62 | Temp 96.7°F | Resp 19

## 2019-04-25 DIAGNOSIS — Z79899 Other long term (current) drug therapy: Secondary | ICD-10-CM | POA: Insufficient documentation

## 2019-04-25 DIAGNOSIS — D509 Iron deficiency anemia, unspecified: Secondary | ICD-10-CM | POA: Insufficient documentation

## 2019-04-25 LAB — CBC WITH DIFFERENTIAL/PLATELET
Abs Immature Granulocytes: 0.03 10*3/uL (ref 0.00–0.07)
Basophils Absolute: 0 10*3/uL (ref 0.0–0.1)
Basophils Relative: 0 %
Eosinophils Absolute: 0.2 10*3/uL (ref 0.0–0.5)
Eosinophils Relative: 4 %
HCT: 31.2 % — ABNORMAL LOW (ref 39.0–52.0)
Hemoglobin: 8.6 g/dL — ABNORMAL LOW (ref 13.0–17.0)
Immature Granulocytes: 1 %
Lymphocytes Relative: 16 %
Lymphs Abs: 0.8 10*3/uL (ref 0.7–4.0)
MCH: 22.7 pg — ABNORMAL LOW (ref 26.0–34.0)
MCHC: 27.6 g/dL — ABNORMAL LOW (ref 30.0–36.0)
MCV: 82.3 fL (ref 80.0–100.0)
Monocytes Absolute: 0.5 10*3/uL (ref 0.1–1.0)
Monocytes Relative: 9 %
Neutro Abs: 3.5 10*3/uL (ref 1.7–7.7)
Neutrophils Relative %: 70 %
Platelets: 240 10*3/uL (ref 150–400)
RBC: 3.79 MIL/uL — ABNORMAL LOW (ref 4.22–5.81)
RDW: 25.2 % — ABNORMAL HIGH (ref 11.5–15.5)
WBC: 5 10*3/uL (ref 4.0–10.5)
nRBC: 0 % (ref 0.0–0.2)

## 2019-04-25 LAB — IRON AND TIBC
Iron: 27 ug/dL — ABNORMAL LOW (ref 45–182)
Saturation Ratios: 7 % — ABNORMAL LOW (ref 17.9–39.5)
TIBC: 399 ug/dL (ref 250–450)
UIBC: 372 ug/dL

## 2019-04-25 LAB — FERRITIN: Ferritin: 60 ng/mL (ref 24–336)

## 2019-04-25 LAB — VITAMIN B12: Vitamin B-12: 580 pg/mL (ref 180–914)

## 2019-04-25 MED ORDER — SODIUM CHLORIDE 0.9 % IV SOLN
Freq: Once | INTRAVENOUS | Status: AC
  Filled 2019-04-25: qty 250

## 2019-04-25 MED ORDER — SODIUM CHLORIDE 0.9 % IV SOLN
510.0000 mg | Freq: Once | INTRAVENOUS | Status: AC
  Administered 2019-04-25: 14:00:00 510 mg via INTRAVENOUS
  Filled 2019-04-25: qty 510

## 2019-04-25 NOTE — Progress Notes (Signed)
Pt here for follow up. Recently d/c'd from hospital. Endorses fatigue.

## 2019-04-26 NOTE — Progress Notes (Signed)
Marcus Coleman  [phone]) [phone] Fax:(336) [phone]  ID: PIER YOOS OB: [birth year]  MR#: [id number]  [id number]  Patient Care Team: Idelle Crouch, MD as PCP - General (Internal Medicine)  CHIEF COMPLAINT: Iron deficiency anemia.  INTERVAL HISTORY: Patient returns to clinic today for repeat laboratory work and hospital follow-up.  He was recently admitted to the hospital for symptomatic anemia possibly secondary to GI bleed and a hemoglobin of approximately 5.0.  He continues to have weakness and fatigue, but admits this has improved since discharge.  He has no neurologic complaints.  He denies any recent fevers or illnesses.  He has a good appetite and denies weight loss.  He denies any chest pain, shortness of breath, cough, or hemoptysis.  He denies any nausea, vomiting, constipation, or diarrhea.  He has no melena or hematochezia.  He has no urinary complaints.  Patient offers no further specific complaints today.  REVIEW OF SYSTEMS:   Review of Systems  Constitutional: Positive for malaise/fatigue. Negative for fever and weight loss.  Respiratory: Negative.  Negative for cough, hemoptysis and shortness of breath.   Cardiovascular: Negative.  Negative for chest pain and leg swelling.  Gastrointestinal: Negative.  Negative for abdominal pain, blood in stool and melena.  Genitourinary: Negative.  Negative for hematuria.  Musculoskeletal: Negative.  Negative for back pain.  Skin: Negative.  Negative for rash.  Neurological: Positive for weakness. Negative for focal weakness and headaches.  Psychiatric/Behavioral: Negative.  The patient is not nervous/anxious.     As per HPI. Otherwise, a complete review of systems is negative.  PAST MEDICAL HISTORY: Past Medical History:  Diagnosis Date  . Anemia   . Chronic diarrhea   . DDD (degenerative disc disease), cervical   . Hyperlipidemia   . Hypertension    borderline  . IDA (iron deficiency anemia)    . Low testosterone   . Vasovagal syncope     PAST SURGICAL HISTORY: Past Surgical History:  Procedure Laterality Date  . CARDIAC CATHETERIZATION    . catracts Bilateral   . CHOLECYSTECTOMY    . COLONOSCOPY WITH PROPOFOL N/A 06/26/2016   Procedure: COLONOSCOPY WITH PROPOFOL;  Surgeon: Manya Silvas, MD;  Location: [facility] ENDOSCOPY;  Service: Endoscopy;  Laterality: N/A;  . ENDOSCOPIC RETROGRADE CHOLANGIOPANCREATOGRAPHY (ERCP) WITH PROPOFOL    . ESOPHAGOGASTRODUODENOSCOPY N/A 04/19/2019   Procedure: ESOPHAGOGASTRODUODENOSCOPY (EGD);  Surgeon: [name], MD;  Location: [facility] ENDOSCOPY;  Service: Gastroenterology;  Laterality: N/A;  . eyelid surgery    . FOOT SURGERY    . HEMORROIDECTOMY    . HERNIA REPAIR    . mrercp    . RHINOPLASTY    . TONSILLECTOMY    . WISDOM TOOTH EXTRACTION      FAMILY HISTORY: Family History  Problem Relation Age of Onset  . Hypothyroidism Mother   . Diabetes Mother   . Lung cancer Mother     ADVANCED DIRECTIVES (Y/N):  N  HEALTH MAINTENANCE: Social History   Tobacco Use  . Smoking status: Former Smoker    Packs/day: 1.00    Years: 25.00    Pack years: 25.00    Types: Cigarettes    Quit date: 12/05/1986    Years since quitting: 32.4  . Smokeless tobacco: Never Used  Substance Use Topics  . Alcohol use: Yes    Alcohol/week: 14.0 standard drinks    Types: 14 Cans of beer per week    Comment: 2 beers per day  . Drug use: No  Colonoscopy:  PAP:  Bone density:  Lipid panel:  Allergies  Allergen Reactions  . Nsaids     Can take Ibuprofen in moderation/high doses or prolonged use causes GI bleed    Current Outpatient Medications  Medication Sig Dispense Refill  . acetaminophen (TYLENOL) 325 MG tablet Take 650 mg by mouth every 6 (six) hours as needed.    Marland Kitchen Apixaban Starter Pack (ELIQUIS DVT/PE STARTER PACK) 5 MG TBPK Take as directed on package: start with two-5mg  tablets twice daily for 7 days. On day 8, switch to one-5mg  tablet  twice daily. 1 each 0  . cetirizine (ZYRTEC) 10 MG tablet Take 10 mg by mouth daily.    Marland Kitchen docusate sodium (COLACE) 100 MG capsule Take 1 capsule (100 mg total) by mouth 2 (two) times daily as needed for mild constipation. 15 capsule 0  . ferrous sulfate 325 (65 FE) MG tablet Take 325 mg by mouth 2 (two) times daily with a meal.     . Multiple Vitamin (MULTIVITAMIN) tablet Take 1 tablet by mouth daily.    . pantoprazole (PROTONIX) 40 MG tablet Take 1 tablet (40 mg total) by mouth 2 (two) times daily. 60 tablet 0  . tamsulosin (FLOMAX) 0.4 MG CAPS capsule Take 1 capsule (0.4 mg total) by mouth daily after breakfast. 90 capsule 3  . timolol (TIMOPTIC) 0.5 % ophthalmic solution Place 1 drop into both eyes daily.     . vitamin B-12 (CYANOCOBALAMIN) 500 MCG tablet Take 1,000 mcg by mouth daily.     . vitamin C (ASCORBIC ACID) 500 MG tablet Take 1,000 mg by mouth daily.      No current facility-administered medications for this visit.    OBJECTIVE: Vitals:   04/25/19 1330  BP: (!) 165/83  Pulse: 77  Temp: (!) 96 F (35.6 C)     Body mass index is 36.8 kg/m.    ECOG FS:0 - Asymptomatic  General: Well-developed, well-nourished, no acute distress. Eyes: Pink conjunctiva, anicteric sclera. HEENT: Normocephalic, moist mucous membranes. Lungs: No audible wheezing or coughing. Heart: Regular rate and rhythm. Abdomen: Soft, nontender, no obvious distention. Musculoskeletal: No edema, cyanosis, or clubbing. Neuro: Alert, answering all questions appropriately. Cranial nerves grossly intact. Skin: No rashes or petechiae noted. Psych: Normal affect.   LAB RESULTS:  Lab Results  Component Value Date   NA 138 04/21/2019   K 3.4 (L) 04/21/2019   CL 106 04/21/2019   CO2 26 04/21/2019   GLUCOSE 112 (H) 04/21/2019   BUN 16 04/21/2019   CREATININE 0.69 04/21/2019   CALCIUM 8.0 (L) 04/21/2019   GFRNONAA >60 04/21/2019   GFRAA >60 04/21/2019    Lab Results  Component Value Date   WBC 5.0  04/25/2019   NEUTROABS 3.5 04/25/2019   HGB 8.6 (L) 04/25/2019   HCT 31.2 (L) 04/25/2019   MCV 82.3 04/25/2019   PLT 240 04/25/2019   Lab Results  Component Value Date   IRON 27 (L) 04/25/2019   TIBC 399 04/25/2019   IRONPCTSAT 7 (L) 04/25/2019   Lab Results  Component Value Date   FERRITIN 60 04/25/2019     STUDIES: VAS [place] LOWER EXTREMITY VENOUS REFLUX  Result Date: 04/01/2019  Lower Venous Reflux Study Indications: Swelling, and right x 2 weeks.  Comparison Study: 2016 and was negative for dvt right leg Performing Technologist: Concha [place] RVT  Examination Guidelines: A complete evaluation includes B-mode imaging, spectral Doppler, color Doppler, and power Doppler as needed of all accessible portions of each  vessel. Bilateral testing is considered an integral part of a complete examination. Limited examinations for reoccurring indications may be performed as noted. The reflux portion of the exam is performed with the patient in reverse Trendelenburg. Significant venous reflux is defined as ?500 ms in the superficial venous system, and >1 second in the deep venous system.  +---------+---------------+---------+-----------+------------+-----------------+ RIGHT    CompressibilityPhasicitySpontaneityProperties  Thrombus Aging    +---------+---------------+---------+-----------+------------+-----------------+ CFV      Full           Yes      Yes                                      +---------+---------------+---------+-----------+------------+-----------------+ SFJ      Full                                                             +---------+---------------+---------+-----------+------------+-----------------+ FV Prox  Full                                                             +---------+---------------+---------+-----------+------------+-----------------+ FV Mid   Full           Yes      Yes                                       +---------+---------------+---------+-----------+------------+-----------------+ FV DistalFull                                                             +---------+---------------+---------+-----------+------------+-----------------+ PFV      Full           Yes      Yes                                      +---------+---------------+---------+-----------+------------+-----------------+ POP      Partial                            softly      Age Indeterminate                                             echogenic                     +---------+---------------+---------+-----------+------------+-----------------+ PTV      Full           Yes      Yes                                      +---------+---------------+---------+-----------+------------+-----------------+  PERO     Full           Yes      Yes                                      +---------+---------------+---------+-----------+------------+-----------------+ Gastroc  Full                                                             +---------+---------------+---------+-----------+------------+-----------------+ SSV      Partial                            softly      Age Indeterminate                                             echogenic                     +---------+---------------+---------+-----------+------------+-----------------+   +----+---------------+---------+-----------+----------+--------------+ LEFTCompressibilityPhasicitySpontaneityPropertiesThrombus Aging +----+---------------+---------+-----------+----------+--------------+ CFV Full           Yes      Yes                                 +----+---------------+---------+-----------+----------+--------------+ SFJ Full           Yes      Yes                                 +----+---------------+---------+-----------+----------+--------------+   Venous Reflux Times  +--------------+------+-----------+------------+------------------------+ RIGHT         RefluxReflux TimeDiameter cmsComments                                Yes                                                  +--------------+------+-----------+------------+------------------------+ CFV            yes     [id number]                                         +--------------+------+-----------+------------+------------------------+ FV mid         yes     1071                                         +--------------+------+-----------+------------+------------------------+ GSV prox thigh                             prior ablation/stripping +--------------+------+-----------+------------+------------------------+  Summary: Right: Findings consistent with  age indeterminate deep vein thrombosis involving the right popliteal vein. Findings consistent with age indeterminate superficial vein thrombosis involving the right small saphenous vein. There is softly echogenic thrombus  in the pop vein and lesser saphenous vein only with partial compression and recannalized flow seen in both vessels. Patient has experienced pain and swelling for 2 weeks. The right GSV is closed from proximal thigh distally from previous ablation. Left: No evidence of common femoral vein obstruction.  *See table(s) above for measurements and observations. Electronically signed by Leotis Pain MD on 04/01/2019 at 12:51:54 PM.    Final     ASSESSMENT: Iron deficiency anemia  1. Iron deficiency anemia:  Patient had a normal colonoscopy on June 26, 2016.  His most recent EGD on April 19, 2019 revealed a nonbleeding gastric as well as evidence of gastritis.  Patient's hemoglobin has improved to 8.6 since discharge.  His iron stores remain decreased.  Proceed with 510 mg IV Feraheme today.  Return to clinic in 1 week for a second infusion.  Patient will then return to clinic in 2 months with repeat laboratory work and further  evaluation.   2.  B12 deficiency: Resolved.  B12 levels remain within normal limits.  I spent a total of 30 minutes reviewing chart data, face-to-face evaluation with the patient, counseling and coordination of care as detailed above.    Patient expressed understanding and was in agreement with this plan. He also understands that He can call clinic at any time with any questions, concerns, or complaints.    Lloyd Huger, MD   04/26/2019 8:53 AM

## 2019-04-28 ENCOUNTER — Encounter (INDEPENDENT_AMBULATORY_CARE_PROVIDER_SITE_OTHER): Payer: Self-pay

## 2019-05-01 ENCOUNTER — Other Ambulatory Visit: Payer: Self-pay

## 2019-05-01 ENCOUNTER — Inpatient Hospital Stay: Payer: PPO

## 2019-05-01 VITALS — BP 140/70 | HR 75 | Temp 97.1°F | Resp 18

## 2019-05-01 DIAGNOSIS — D509 Iron deficiency anemia, unspecified: Secondary | ICD-10-CM | POA: Diagnosis not present

## 2019-05-01 MED ORDER — CYANOCOBALAMIN 1000 MCG/ML IJ SOLN
1000.0000 ug | Freq: Once | INTRAMUSCULAR | Status: AC
  Administered 2019-05-01: 1000 ug via INTRAMUSCULAR
  Filled 2019-05-01: qty 1

## 2019-05-01 MED ORDER — SODIUM CHLORIDE 0.9 % IV SOLN
Freq: Once | INTRAVENOUS | Status: AC
  Filled 2019-05-01: qty 250

## 2019-05-01 MED ORDER — SODIUM CHLORIDE 0.9 % IV SOLN
510.0000 mg | Freq: Once | INTRAVENOUS | Status: AC
  Administered 2019-05-01: 510 mg via INTRAVENOUS
  Filled 2019-05-01: qty 17

## 2019-05-06 ENCOUNTER — Other Ambulatory Visit: Payer: Self-pay

## 2019-05-06 ENCOUNTER — Ambulatory Visit: Payer: PPO | Admitting: Gastroenterology

## 2019-05-06 VITALS — BP 137/83 | HR 77 | Temp 98.1°F | Ht 66.0 in | Wt 213.6 lb

## 2019-05-06 DIAGNOSIS — Z86010 Personal history of colonic polyps: Secondary | ICD-10-CM

## 2019-05-06 DIAGNOSIS — K259 Gastric ulcer, unspecified as acute or chronic, without hemorrhage or perforation: Secondary | ICD-10-CM

## 2019-05-06 MED ORDER — PANTOPRAZOLE SODIUM 40 MG PO TBEC: 40.0000 mg | DELAYED_RELEASE_TABLET | Freq: Every day | ORAL | 3 refills | Status: DC

## 2019-05-06 NOTE — Addendum Note (Signed)
Addended by: Dorethea Clan on: 05/06/2019 03:00 PM   Modules accepted: Orders

## 2019-05-06 NOTE — Progress Notes (Signed)
Marcus Bellows MD, MRCP(U.K) [address]  [place]  [place],  [address]  Main: [phone]  Fax: [phone]   Primary Care Physician: Marcus Crouch, MD  Primary Gastroenterologist:  Dr. Harland Coleman follow-up  HPI: Marcus Coleman is a 79 y.o. male    Summary of history :  He is a patient of [facility] clinic gastroenterology who is requesting transfer of care to our practice.  He was admitted on 04/18/2019 with anemia and melena.  Incidentally found to have a hemoglobin of 5.6 when he presented to his doctor's office with shortness of breath.  He did have a preceding history of melena and had been on Eliquis.  Received a blood transfusion.  Previously evaluated by Dr. Vira Coleman back in 2018 for iron deficiency anemia.  Her underwent an EGD, colonoscopy, capsule study.  10 mm polyp was removed from his colon which was a tubular adenoma.  I performed an upper endoscopy on 04/19/2019 and noted a large hiatal hernia and found nonbleeding superficial gastric ulcers with a clean base in the gastric antrum.  There were nonbleeding.  Commenced on a PPI and advised to check for H. pylori stool antigen.  Found to have a low B12 in addition to iron deficiency.  Interval history   04/20/2019-05/06/2019 04/19/2019: H. pylori stool antigen negative. 04/25/2019: Followed up with Dr. Grayland Coleman as an outpatient.  Given IV iron.  B12 levels normalized when rechecked.  Hemoglobin was 8.6 g on 04/25/2019.   Since discharge she denies any melena.  Her stools are brown in color.  He has resumed Eliquis.  He is on Protonix 40 mg twice a day.  No other symptoms. Current Outpatient Medications  Medication Sig Dispense Refill  . acetaminophen (TYLENOL) 325 MG tablet Take 650 mg by mouth every 6 (six) hours as needed.    Marland Kitchen Apixaban Starter Pack (ELIQUIS DVT/PE STARTER PACK) 5 MG TBPK Take as directed on package: start with two-5mg  tablets twice daily for 7 days. On day 8, switch to one-5mg  tablet  twice daily. 1 each 0  . cetirizine (ZYRTEC) 10 MG tablet Take 10 mg by mouth daily.    Marland Kitchen docusate sodium (COLACE) 100 MG capsule Take 1 capsule (100 mg total) by mouth 2 (two) times daily as needed for mild constipation. 15 capsule 0  . ferrous sulfate 325 (65 FE) MG tablet Take 325 mg by mouth 2 (two) times daily with a meal.     . Multiple Vitamin (MULTIVITAMIN) tablet Take 1 tablet by mouth daily.    . pantoprazole (PROTONIX) 40 MG tablet Take 1 tablet (40 mg total) by mouth 2 (two) times daily. 60 tablet 0  . tamsulosin (FLOMAX) 0.4 MG CAPS capsule Take 1 capsule (0.4 mg total) by mouth daily after breakfast. 90 capsule 3  . timolol (TIMOPTIC) 0.5 % ophthalmic solution Place 1 drop into both eyes daily.     . vitamin B-12 (CYANOCOBALAMIN) 500 MCG tablet Take 1,000 mcg by mouth daily.     . vitamin C (ASCORBIC ACID) 500 MG tablet Take 1,000 mg by mouth daily.      No current facility-administered medications for this visit.    Allergies as of 05/06/2019 - Review Complete 04/25/2019  Allergen Reaction Noted  . Nsaids  06/23/2016    ROS:  General: Negative for anorexia, weight loss, fever, chills, fatigue, weakness. ENT: Negative for hoarseness, difficulty swallowing , nasal congestion. CV: Negative for chest pain, angina, palpitations, dyspnea on exertion, peripheral  edema.  Respiratory: Negative for dyspnea at rest, dyspnea on exertion, cough, sputum, wheezing.  GI: See history of present illness. GU:  Negative for dysuria, hematuria, urinary incontinence, urinary frequency, nocturnal urination.  Endo: Negative for unusual weight change.    Physical Examination:   There were no vitals taken for this visit.  General: Well-nourished, well-developed in no acute distress.  Eyes: No icterus. Conjunctivae pink. Mouth: Oropharyngeal mucosa moist and pink , no lesions erythema or exudate. Lungs: Clear to auscultation bilaterally. Non-labored. Heart: Regular rate and rhythm, no  murmurs rubs or gallops.  Abdomen: Bowel sounds are normal, nontender, nondistended, no hepatosplenomegaly or masses, no abdominal bruits or hernia , no rebound or guarding.   Extremities: No lower extremity edema. No clubbing or deformities. Neuro: Alert and oriented x 3.  Grossly intact. Skin: Warm and dry, no jaundice.   Psych: Alert and cooperative, normal mood and affect.   Imaging Studies: No results found.  Assessment and Plan:   Marcus Coleman is a 79 y.o. y/o male with a history of iron deficiency anemia that has been evaluated by [facility] clinic gastroenterology in 2018 and no clear etiology found.  Presented to the hospital in February 2020 with melena while on Eliquis.  Upper endoscopy demonstrated large hiatal hernia along with superficial nonbleeding gastric ulcers.  H. pylori status was negative.  Received blood transfusion has been discharged.  Post discharge his hemoglobin has improved B12 deficiency has normalized.  Plan 1.  Continue PPI long-term decrease dose of Protonix from 40 mg twice a day to 40 mg once a day.  90-day supply with 3 refills provided. 2.  Discussed about a colonoscopy for surveillance of  prior colon polyp.  We will also perform EGD at the same time to check for healing of the gastric ulcer. 3.  Monitor CBC if does not rise appropriately then may need to repeat capsule study of the small bowel. 4.  B12 levels have normalized but will need to be monitored for the future.  I have discussed alternative options, risks & benefits,  which include, but are not limited to, bleeding, infection, perforation,respiratory complication & drug reaction.  The patient agrees with this plan & written consent will be obtained.     Dr Marcus Bellows  MD,MRCP [facility]) Follow up in 4 months telephone visit

## 2019-05-14 ENCOUNTER — Other Ambulatory Visit (INDEPENDENT_AMBULATORY_CARE_PROVIDER_SITE_OTHER): Payer: Self-pay | Admitting: Nurse Practitioner

## 2019-05-14 DIAGNOSIS — I82431 Acute embolism and thrombosis of right popliteal vein: Secondary | ICD-10-CM

## 2019-05-14 DIAGNOSIS — I82811 Embolism and thrombosis of superficial veins of right lower extremities: Secondary | ICD-10-CM

## 2019-05-19 ENCOUNTER — Ambulatory Visit (INDEPENDENT_AMBULATORY_CARE_PROVIDER_SITE_OTHER): Payer: PPO | Admitting: Nurse Practitioner

## 2019-05-19 ENCOUNTER — Ambulatory Visit (INDEPENDENT_AMBULATORY_CARE_PROVIDER_SITE_OTHER): Payer: PPO

## 2019-05-19 ENCOUNTER — Encounter (INDEPENDENT_AMBULATORY_CARE_PROVIDER_SITE_OTHER): Payer: Self-pay | Admitting: Nurse Practitioner

## 2019-05-19 ENCOUNTER — Other Ambulatory Visit: Payer: Self-pay

## 2019-05-19 VITALS — BP 156/92 | HR 61 | Resp 14 | Ht 66.0 in | Wt 201.0 lb

## 2019-05-19 DIAGNOSIS — I82431 Acute embolism and thrombosis of right popliteal vein: Secondary | ICD-10-CM

## 2019-05-19 DIAGNOSIS — D508 Other iron deficiency anemias: Secondary | ICD-10-CM

## 2019-05-19 DIAGNOSIS — I82811 Embolism and thrombosis of superficial veins of right lower extremities: Secondary | ICD-10-CM

## 2019-05-19 NOTE — Progress Notes (Signed)
SUBJECTIVE:  Patient ID: Marcus Coleman, male    DOB: 06-05-1940, 79 y.o.   MRN: [id number] Chief Complaint  Patient presents with  . Follow-up    U/S Follow up    HPI  Marcus Coleman is a 79 y.o. male the presents today for follow-up studies after having a DVT diagnosed in the right popliteal vein on 03/31/2019.  The patient subsequently also had a superficial thrombophlebitis in the right small saphenous vein.  The patient was started on Eliquis, however he presented to the emergency room on 04/17/2019 with dizziness and weakness.  The patient was found to have his hemoglobin dropped to 5.  In-hospital work-up revealed the patient had an ulcer.  The patient today is feeling much better and the most recent hemoglobin was 8.6.  The patient has received about 3 units of blood as well as iron transfusions.  The patient still gets somewhat tired however he feels much better than when initially found to be anemic.  The patient continues to follow with [name] in oncology as well as with [name] in gastroenterology.  The pain and tenderness in his right lower extremity has also decreased.  Overall the patient appears to be doing well.  Today noninvasive studies show that the patient has no evidence of DVT in the right lower extremity.  There is also no evidence of DVT in the left lower extremity.  The superficial thrombophlebitis is also resolved.  Past Medical History:  Diagnosis Date  . Anemia   . Chronic diarrhea   . DDD (degenerative disc disease), cervical   . Hyperlipidemia   . Hypertension    borderline  . IDA (iron deficiency anemia)   . Low testosterone   . Vasovagal syncope     Past Surgical History:  Procedure Laterality Date  . CARDIAC CATHETERIZATION    . catracts Bilateral   . CHOLECYSTECTOMY    . COLONOSCOPY WITH PROPOFOL N/A 06/26/2016   Procedure: COLONOSCOPY WITH PROPOFOL;  Surgeon: Manya Silvas, MD;  Location: [facility] ENDOSCOPY;  Service: Endoscopy;  Laterality:  N/A;  . ENDOSCOPIC RETROGRADE CHOLANGIOPANCREATOGRAPHY (ERCP) WITH PROPOFOL    . ESOPHAGOGASTRODUODENOSCOPY N/A 04/19/2019   Procedure: ESOPHAGOGASTRODUODENOSCOPY (EGD);  Surgeon: [name], MD;  Location: [facility] ENDOSCOPY;  Service: Gastroenterology;  Laterality: N/A;  . eyelid surgery    . FOOT SURGERY    . HEMORROIDECTOMY    . HERNIA REPAIR    . mrercp    . RHINOPLASTY    . TONSILLECTOMY    . WISDOM TOOTH EXTRACTION      Social History   Socioeconomic History  . Marital status: Married    Spouse name: Not on file  . Number of children: Not on file  . Years of education: Not on file  . Highest education level: Not on file  Occupational History  . Not on file  Tobacco Use  . Smoking status: Former Smoker    Packs/day: 1.00    Years: 25.00    Pack years: 25.00    Types: Cigarettes    Quit date: 12/05/1986    Years since quitting: 32.4  . Smokeless tobacco: Never Used  Substance and Sexual Activity  . Alcohol use: Yes    Alcohol/week: 14.0 standard drinks    Types: 14 Cans of beer per week    Comment: 2 beers per day  . Drug use: No  . Sexual activity: Not on file  Other Topics Concern  . Not on file  Social History  Narrative  . Not on file   Social Determinants of Health   Financial Resource Strain:   . Difficulty of Paying Living Expenses: Not on file  Food Insecurity:   . Worried About Charity fundraiser in the Last Year: Not on file  . Ran Out of Food in the Last Year: Not on file  Transportation Needs:   . Lack of Transportation (Medical): Not on file  . Lack of Transportation (Non-Medical): Not on file  Physical Activity:   . Days of Exercise per Week: Not on file  . Minutes of Exercise per Session: Not on file  Stress:   . Feeling of Stress : Not on file  Social Connections:   . Frequency of Communication with Friends and Family: Not on file  . Frequency of Social Gatherings with Friends and Family: Not on file  . Attends Religious Services: Not on  file  . Active Member of Clubs or Organizations: Not on file  . Attends Archivist Meetings: Not on file  . Marital Status: Not on file  Intimate Partner Violence:   . Fear of Current or Ex-Partner: Not on file  . Emotionally Abused: Not on file  . Physically Abused: Not on file  . Sexually Abused: Not on file    Family History  Problem Relation Age of Onset  . Hypothyroidism Mother   . Diabetes Mother   . Lung cancer Mother     Allergies  Allergen Reactions  . Nsaids     Can take Ibuprofen in moderation/high doses or prolonged use causes GI bleed     Review of Systems   Review of Systems: Negative Unless Checked Constitutional: [] Weight loss  [] Fever  [] Chills Cardiac: [] Chest pain   []  Atrial Fibrillation  [] Palpitations   [] Shortness of breath when laying flat   [] Shortness of breath with exertion. [] Shortness of breath at rest Vascular:  [] Pain in legs with walking   [] Pain in legs with standing [] Pain in legs when laying flat   [] Claudication    [] Pain in feet when laying flat    [] History of DVT   [] Phlebitis   [] Swelling in legs   [] Varicose veins   [] Non-healing ulcers Pulmonary:   [] Uses home oxygen   [] Productive cough   [] Hemoptysis   [] Wheeze  [] COPD   [] Asthma Neurologic:  [] Dizziness   [] Seizures  [] Blackouts [] History of stroke   [] History of TIA  [] Aphasia   [] Temporary Blindness   [] Weakness or numbness in arm   [] Weakness or numbness in leg Musculoskeletal:   [] Joint swelling   [] Joint pain   [] Low back pain  []  History of Knee Replacement [x] Arthritis [] back Surgeries  []  Spinal Stenosis    Hematologic:  [] Easy bruising  [] Easy bleeding   [] Hypercoagulable state   [x] Anemic Gastrointestinal:  [] Diarrhea   [] Vomiting  [] Gastroesophageal reflux/heartburn   [] Difficulty swallowing. [] Abdominal pain Genitourinary:  [] Chronic kidney disease   [] Difficult urination  [] Anuric   [] Blood in urine [] Frequent urination  [] Burning with urination    [] Hematuria Skin:  [] Rashes   [] Ulcers [] Wounds Psychological:  [] History of anxiety   []  History of major depression  []  Memory Difficulties      OBJECTIVE:   Physical Exam  BP (!) 156/92   Pulse 61   Resp 14   Ht 5\' 6"  (1.676 m)   Wt 201 lb (91.2 kg)   BMI 32.44 kg/m   Gen: WD/WN, NAD Head: [place]/AT, No temporalis wasting.  Ear/Nose/Throat: Hearing grossly intact,  nares w/o erythema or drainage Eyes: PER, EOMI, sclera nonicteric.  Neck: Supple, no masses.  No JVD.  Pulmonary:  Good air movement, no use of accessory muscles.  Cardiac: RRR Vascular:  No palpable superficial venous thrombosis. Vessel Right Left  Posterior Tibial Palpable Palpable   Gastrointestinal: soft, non-distended. No guarding/no peritoneal signs.  Musculoskeletal: M/S 5/5 throughout.  No deformity or atrophy.  Neurologic: Pain and light touch intact in extremities.  Symmetrical.  Speech is fluent. Motor exam as listed above. Psychiatric: Judgment intact, Mood & affect appropriate for pt's clinical situation. Dermatologic: No Venous rashes. No Ulcers Noted.  No changes consistent with cellulitis. Lymph : No Cervical lymphadenopathy, no lichenification or skin changes of chronic lymphedema.       ASSESSMENT AND PLAN:  1. Acute deep vein thrombosis (DVT) of popliteal vein of right lower extremity (HCC) Today the evidence of his DVT has resolved.  The superficial venous thrombosis appears to be resolved as well.  The patient has minimal swelling.  Patient is advised to continue to wear medical grade 1 compression stockings.  He should elevate when possible and continue to exercise as possible as well.  The patient will continue with anticoagulation until 06/29/2019 which would be 3 months since his diagnosis.  We plan to have the patient follow-up in 6 months to assess his lower extremity edema as well as for any possible postphlebitic symptoms.  Patient is also advised to contact office if something changes  within those 6 months.  2. Other iron deficiency anemia This is currently being managed by [name] with iron transplants.  The patient still has some anemia however it is greatly improved since his most recent hospital visit.  Patient will continue to follow-up in regards to iron treatments in addition to his gastroenterologist.  Current Outpatient Medications on File Prior to Visit  Medication Sig Dispense Refill  . Apixaban Starter Pack (ELIQUIS DVT/PE STARTER PACK) 5 MG TBPK Take as directed on package: start with two-5mg  tablets twice daily for 7 days. On day 8, switch to one-5mg  tablet twice daily. 1 each 0  . cetirizine (ZYRTEC) 10 MG tablet Take 10 mg by mouth daily.    Marland Kitchen docusate sodium (COLACE) 100 MG capsule Take 1 capsule (100 mg total) by mouth 2 (two) times daily as needed for mild constipation. 15 capsule 0  . Multiple Vitamin (MULTIVITAMIN) tablet Take 1 tablet by mouth daily.    . pantoprazole (PROTONIX) 40 MG tablet Take 1 tablet (40 mg total) by mouth daily. 90 tablet 3  . tamsulosin (FLOMAX) 0.4 MG CAPS capsule Take 1 capsule (0.4 mg total) by mouth daily after breakfast. 90 capsule 3  . timolol (TIMOPTIC) 0.5 % ophthalmic solution Place 1 drop into both eyes daily.     . vitamin B-12 (CYANOCOBALAMIN) 500 MCG tablet Take 1,000 mcg by mouth daily.     . vitamin C (ASCORBIC ACID) 500 MG tablet Take 1,000 mg by mouth daily.     Marland Kitchen acetaminophen (TYLENOL) 325 MG tablet Take 650 mg by mouth every 6 (six) hours as needed.    . ferrous sulfate 325 (65 FE) MG tablet Take 325 mg by mouth 2 (two) times daily with a meal.      No current facility-administered medications on file prior to visit.    There are no Patient Instructions on file for this visit. No follow-ups on file.   Kris Hartmann, NP  This note was completed with Sales executive.  Any errors  are purely unintentional.

## 2019-05-22 DIAGNOSIS — Z79899 Other long term (current) drug therapy: Secondary | ICD-10-CM | POA: Diagnosis not present

## 2019-05-22 DIAGNOSIS — Z0000 Encounter for general adult medical examination without abnormal findings: Secondary | ICD-10-CM | POA: Diagnosis not present

## 2019-05-22 DIAGNOSIS — E7800 Pure hypercholesterolemia, unspecified: Secondary | ICD-10-CM | POA: Diagnosis not present

## 2019-05-22 DIAGNOSIS — H401111 Primary open-angle glaucoma, right eye, mild stage: Secondary | ICD-10-CM | POA: Diagnosis not present

## 2019-05-22 DIAGNOSIS — I340 Nonrheumatic mitral (valve) insufficiency: Secondary | ICD-10-CM | POA: Diagnosis not present

## 2019-05-22 DIAGNOSIS — R030 Elevated blood-pressure reading, without diagnosis of hypertension: Secondary | ICD-10-CM | POA: Diagnosis not present

## 2019-05-22 DIAGNOSIS — D509 Iron deficiency anemia, unspecified: Secondary | ICD-10-CM | POA: Diagnosis not present

## 2019-05-22 DIAGNOSIS — M5416 Radiculopathy, lumbar region: Secondary | ICD-10-CM | POA: Diagnosis not present

## 2019-05-22 DIAGNOSIS — Z125 Encounter for screening for malignant neoplasm of prostate: Secondary | ICD-10-CM | POA: Diagnosis not present

## 2019-06-06 ENCOUNTER — Other Ambulatory Visit: Payer: Self-pay

## 2019-06-06 ENCOUNTER — Telehealth: Payer: Self-pay

## 2019-06-06 DIAGNOSIS — Z86010 Personal history of colonic polyps: Secondary | ICD-10-CM

## 2019-06-06 DIAGNOSIS — K259 Gastric ulcer, unspecified as acute or chronic, without hemorrhage or perforation: Secondary | ICD-10-CM

## 2019-06-06 NOTE — Telephone Encounter (Signed)
Spoke with pt and reminded him that he's due to have the upper and lower endoscopy with [name] in April. Pt agrees and has scheduled the procedure on 07-03-19. Pt states his blood thinner (Eliquis) will be discontinued by his provider after 06-29-19.

## 2019-06-09 DIAGNOSIS — I82431 Acute embolism and thrombosis of right popliteal vein: Secondary | ICD-10-CM | POA: Diagnosis not present

## 2019-06-09 DIAGNOSIS — Z8719 Personal history of other diseases of the digestive system: Secondary | ICD-10-CM | POA: Diagnosis not present

## 2019-06-09 DIAGNOSIS — E7800 Pure hypercholesterolemia, unspecified: Secondary | ICD-10-CM | POA: Diagnosis not present

## 2019-06-09 DIAGNOSIS — R030 Elevated blood-pressure reading, without diagnosis of hypertension: Secondary | ICD-10-CM | POA: Diagnosis not present

## 2019-06-09 DIAGNOSIS — Z86718 Personal history of other venous thrombosis and embolism: Secondary | ICD-10-CM | POA: Insufficient documentation

## 2019-06-09 DIAGNOSIS — I340 Nonrheumatic mitral (valve) insufficiency: Secondary | ICD-10-CM | POA: Diagnosis not present

## 2019-06-09 DIAGNOSIS — I82439 Acute embolism and thrombosis of unspecified popliteal vein: Secondary | ICD-10-CM | POA: Insufficient documentation

## 2019-06-17 ENCOUNTER — Telehealth: Payer: Self-pay | Admitting: Gastroenterology

## 2019-06-17 NOTE — Telephone Encounter (Signed)
Patient called & l/m on v/m stating he would like a call back today. He is scheduled for a colonoscopy 07-03-19 & would like advise because he has an iron infusion on 06-24-2019

## 2019-06-17 NOTE — Telephone Encounter (Signed)
Spoke with pt regarding his question. Pt wants to know if he's okay to have the procedure after his upcoming iron infusion. I explained to pt the iron infusion will not effect his endoscopy procedure. Pt understands.

## 2019-06-20 NOTE — Progress Notes (Signed)
[place]  [phone] [phone]  ID: [id number] TUMMINIA OB: 02-03-1941  MR#: [id number]  [id number]  Patient Care Team: Idelle Crouch, MD as PCP - General (Internal Medicine)  CHIEF COMPLAINT: Iron deficiency anemia.  INTERVAL HISTORY: Patient returns to clinic today for repeat laboratory work, further evaluation, and consideration of additional IV Feraheme.  He continues to have mild weakness and fatigue, but this is significantly improved the past several months.  He otherwise feels well.  He has no neurologic complaints.  He denies any recent fevers or illnesses.  He has a good appetite and denies weight loss.  He denies any chest pain, shortness of breath, cough, or hemoptysis.  He denies any nausea, vomiting, constipation, or diarrhea.  He has no melena or hematochezia.  He has no urinary complaints.  Patient offers no further specific complaints today.  REVIEW OF SYSTEMS:   Review of Systems  Constitutional: Positive for malaise/fatigue. Negative for fever and weight loss.  Respiratory: Negative.  Negative for cough, hemoptysis and shortness of breath.   Cardiovascular: Negative.  Negative for chest pain and leg swelling.  Gastrointestinal: Negative.  Negative for abdominal pain, blood in stool and melena.  Genitourinary: Negative.  Negative for hematuria.  Musculoskeletal: Negative.  Negative for back pain.  Skin: Negative.  Negative for rash.  Neurological: Positive for weakness. Negative for focal weakness and headaches.  Psychiatric/Behavioral: Negative.  The patient is not nervous/anxious.     As per HPI. Otherwise, a complete review of systems is negative.  PAST MEDICAL HISTORY: Past Medical History:  Diagnosis Date  . Anemia   . Chronic diarrhea   . DDD (degenerative disc disease), cervical   . Hyperlipidemia   . Hypertension    borderline  . IDA (iron deficiency anemia)   . Low testosterone   . Vasovagal syncope      PAST SURGICAL HISTORY: Past Surgical History:  Procedure Laterality Date  . CARDIAC CATHETERIZATION    . catracts Bilateral   . CHOLECYSTECTOMY    . COLONOSCOPY WITH PROPOFOL N/A 06/26/2016   Procedure: COLONOSCOPY WITH PROPOFOL;  Surgeon: Manya Silvas, MD;  Location: [facility] ENDOSCOPY;  Service: Endoscopy;  Laterality: N/A;  . ENDOSCOPIC RETROGRADE CHOLANGIOPANCREATOGRAPHY (ERCP) WITH PROPOFOL    . ESOPHAGOGASTRODUODENOSCOPY N/A 04/19/2019   Procedure: ESOPHAGOGASTRODUODENOSCOPY (EGD);  Surgeon: [name], MD;  Location: [facility] ENDOSCOPY;  Service: Gastroenterology;  Laterality: N/A;  . eyelid surgery    . FOOT SURGERY    . HEMORROIDECTOMY    . HERNIA REPAIR    . mrercp    . RHINOPLASTY    . TONSILLECTOMY    . WISDOM TOOTH EXTRACTION      FAMILY HISTORY: Family History  Problem Relation Age of Onset  . Hypothyroidism Mother   . Diabetes Mother   . Lung cancer Mother     ADVANCED DIRECTIVES (Y/N):  N  HEALTH MAINTENANCE: Social History   Tobacco Use  . Smoking status: Former Smoker    Packs/day: 1.00    Years: 25.00    Pack years: 25.00    Types: Cigarettes    Quit date: 12/05/1986    Years since quitting: 32.5  . Smokeless tobacco: Never Used  Substance Use Topics  . Alcohol use: Yes    Alcohol/week: 14.0 standard drinks    Types: 14 Cans of beer per week    Comment: 2 beers per day  . Drug use: No     Colonoscopy:  PAP:  Bone density:  Lipid panel:  Allergies  Allergen Reactions  . Nsaids     Can take Ibuprofen in moderation/high doses or prolonged use causes GI bleed    Current Outpatient Medications  Medication Sig Dispense Refill  . acetaminophen (TYLENOL) 325 MG tablet Take 650 mg by mouth every 6 (six) hours as needed.    Marland Kitchen apixaban (ELIQUIS) 5 MG TABS tablet Take 1 tablet by mouth 2 (two) times daily.     . cetirizine (ZYRTEC) 10 MG tablet Take 10 mg by mouth daily.    . Multiple Vitamin (MULTIVITAMIN) tablet Take 1 tablet by mouth daily.    .  pantoprazole (PROTONIX) 40 MG tablet Take 1 tablet (40 mg total) by mouth daily. 90 tablet 3  . tamsulosin (FLOMAX) 0.4 MG CAPS capsule Take 1 capsule (0.4 mg total) by mouth daily after breakfast. 90 capsule 3  . timolol (TIMOPTIC) 0.5 % ophthalmic solution Place 1 drop into both eyes daily.     . vitamin B-12 (CYANOCOBALAMIN) 500 MCG tablet Take 1,000 mcg by mouth daily.     . vitamin C (ASCORBIC ACID) 500 MG tablet Take 1,000 mg by mouth daily.     Marland Kitchen docusate sodium (COLACE) 100 MG capsule Take 1 capsule (100 mg total) by mouth 2 (two) times daily as needed for mild constipation. (Patient not taking: Reported on 06/23/2019) 15 capsule 0   No current facility-administered medications for this visit.    OBJECTIVE: Vitals:   06/24/19 1328  BP: (!) 134/91  Pulse: 63  Resp: 20  Temp: (!) 95.9 F (35.5 C)  SpO2: 97%     Body mass index is 31.73 kg/m.    ECOG FS:0 - Asymptomatic  General: Well-developed, well-nourished, no acute distress. Eyes: Pink conjunctiva, anicteric sclera. HEENT: Normocephalic, moist mucous membranes. Lungs: No audible wheezing or coughing. Heart: Regular rate and rhythm. Abdomen: Soft, nontender, no obvious distention. Musculoskeletal: No edema, cyanosis, or clubbing. Neuro: Alert, answering all questions appropriately. Cranial nerves grossly intact. Skin: No rashes or petechiae noted. Psych: Normal affect.  LAB RESULTS:  Lab Results  Component Value Date   NA 138 04/21/2019   K 3.4 (L) 04/21/2019   CL 106 04/21/2019   CO2 26 04/21/2019   GLUCOSE 112 (H) 04/21/2019   BUN 16 04/21/2019   CREATININE 0.69 04/21/2019   CALCIUM 8.0 (L) 04/21/2019   GFRNONAA >60 04/21/2019   GFRAA >60 04/21/2019    Lab Results  Component Value Date   WBC 4.7 06/24/2019   NEUTROABS 2.8 06/24/2019   HGB 13.9 06/24/2019   HCT 41.7 06/24/2019   MCV 87.6 06/24/2019   PLT 142 (L) 06/24/2019   Lab Results  Component Value Date   IRON 108 06/24/2019   TIBC 302  06/24/2019   IRONPCTSAT 36 06/24/2019   Lab Results  Component Value Date   FERRITIN 177 06/24/2019     STUDIES: No results found.  ASSESSMENT: Iron deficiency anemia  1. Iron deficiency anemia: Resolved.  Patient's hemoglobin and iron stores are now within normal limits.  He had a normal colonoscopy on June 26, 2016.  His most recent EGD on April 19, 2019 revealed a nonbleeding gastric as well as evidence of gastritis.  Patient reports he has repeat luminal evaluation later this week.  He does not require additional Feraheme today.  Patient last received treatment on May 01, 2019.  Return to clinic in 3 months with repeat laboratory work, further evaluation, and consideration of additional treatment.     2.  B12 deficiency: Resolved.  B12 levels continue to be within normal limits.    Patient expressed understanding and was in agreement with this plan. He also understands that He can call clinic at any time with any questions, concerns, or complaints.    Lloyd Huger, MD   06/25/2019 6:32 AM

## 2019-06-23 ENCOUNTER — Other Ambulatory Visit: Payer: Self-pay

## 2019-06-23 ENCOUNTER — Encounter: Payer: Self-pay | Admitting: Oncology

## 2019-06-23 NOTE — Progress Notes (Signed)
Patient prescreened for appointment. Pt is wondering what the outcome will be and where do we go from here, and what does [name] recommend he do so that he can resume his life? Reports that he wants to live as long as he can as best as he can. Also reports that he's not had any alcohol since he was in the hospital in February.

## 2019-06-24 ENCOUNTER — Encounter: Payer: Self-pay | Admitting: Oncology

## 2019-06-24 ENCOUNTER — Inpatient Hospital Stay: Payer: PPO

## 2019-06-24 ENCOUNTER — Inpatient Hospital Stay: Payer: PPO | Attending: Oncology

## 2019-06-24 ENCOUNTER — Inpatient Hospital Stay (HOSPITAL_BASED_OUTPATIENT_CLINIC_OR_DEPARTMENT_OTHER): Payer: PPO | Admitting: Oncology

## 2019-06-24 VITALS — BP 134/91 | HR 63 | Temp 95.9°F | Resp 20 | Wt 196.6 lb

## 2019-06-24 DIAGNOSIS — Z8349 Family history of other endocrine, nutritional and metabolic diseases: Secondary | ICD-10-CM | POA: Diagnosis not present

## 2019-06-24 DIAGNOSIS — Z801 Family history of malignant neoplasm of trachea, bronchus and lung: Secondary | ICD-10-CM | POA: Diagnosis not present

## 2019-06-24 DIAGNOSIS — Z7901 Long term (current) use of anticoagulants: Secondary | ICD-10-CM | POA: Diagnosis not present

## 2019-06-24 DIAGNOSIS — Z87891 Personal history of nicotine dependence: Secondary | ICD-10-CM | POA: Insufficient documentation

## 2019-06-24 DIAGNOSIS — E785 Hyperlipidemia, unspecified: Secondary | ICD-10-CM | POA: Insufficient documentation

## 2019-06-24 DIAGNOSIS — E538 Deficiency of other specified B group vitamins: Secondary | ICD-10-CM | POA: Insufficient documentation

## 2019-06-24 DIAGNOSIS — Z833 Family history of diabetes mellitus: Secondary | ICD-10-CM | POA: Insufficient documentation

## 2019-06-24 DIAGNOSIS — I10 Essential (primary) hypertension: Secondary | ICD-10-CM | POA: Diagnosis not present

## 2019-06-24 DIAGNOSIS — D509 Iron deficiency anemia, unspecified: Secondary | ICD-10-CM | POA: Insufficient documentation

## 2019-06-24 DIAGNOSIS — Z79899 Other long term (current) drug therapy: Secondary | ICD-10-CM | POA: Insufficient documentation

## 2019-06-24 LAB — CBC WITH DIFFERENTIAL/PLATELET
Abs Immature Granulocytes: 0.02 10*3/uL (ref 0.00–0.07)
Basophils Absolute: 0 10*3/uL (ref 0.0–0.1)
Basophils Relative: 0 %
Eosinophils Absolute: 0.1 10*3/uL (ref 0.0–0.5)
Eosinophils Relative: 3 %
HCT: 41.7 % (ref 39.0–52.0)
Hemoglobin: 13.9 g/dL (ref 13.0–17.0)
Immature Granulocytes: 0 %
Lymphocytes Relative: 28 %
Lymphs Abs: 1.3 10*3/uL (ref 0.7–4.0)
MCH: 29.2 pg (ref 26.0–34.0)
MCHC: 33.3 g/dL (ref 30.0–36.0)
MCV: 87.6 fL (ref 80.0–100.0)
Monocytes Absolute: 0.4 10*3/uL (ref 0.1–1.0)
Monocytes Relative: 9 %
Neutro Abs: 2.8 10*3/uL (ref 1.7–7.7)
Neutrophils Relative %: 60 %
Platelets: 142 10*3/uL — ABNORMAL LOW (ref 150–400)
RBC: 4.76 MIL/uL (ref 4.22–5.81)
RDW: 18.8 % — ABNORMAL HIGH (ref 11.5–15.5)
WBC: 4.7 10*3/uL (ref 4.0–10.5)
nRBC: 0 % (ref 0.0–0.2)

## 2019-06-24 LAB — IRON AND TIBC
Iron: 108 ug/dL (ref 45–182)
Saturation Ratios: 36 % (ref 17.9–39.5)
TIBC: 302 ug/dL (ref 250–450)
UIBC: 194 ug/dL

## 2019-06-24 LAB — VITAMIN B12: Vitamin B-12: 290 pg/mL (ref 180–914)

## 2019-06-24 LAB — FERRITIN: Ferritin: 177 ng/mL (ref 24–336)

## 2019-07-01 ENCOUNTER — Other Ambulatory Visit
Admission: RE | Admit: 2019-07-01 | Discharge: 2019-07-01 | Disposition: A | Discharge status: Home / Self Care | Payer: PPO | Source: Ambulatory Visit | Attending: Gastroenterology | Admitting: Gastroenterology

## 2019-07-01 ENCOUNTER — Other Ambulatory Visit: Payer: Self-pay

## 2019-07-01 DIAGNOSIS — Z01812 Encounter for preprocedural laboratory examination: Secondary | ICD-10-CM | POA: Diagnosis not present

## 2019-07-01 DIAGNOSIS — Z20822 Contact with and (suspected) exposure to covid-19: Secondary | ICD-10-CM | POA: Diagnosis not present

## 2019-07-02 ENCOUNTER — Encounter: Payer: Self-pay | Admitting: Gastroenterology

## 2019-07-02 LAB — SARS CORONAVIRUS 2 (TAT 6-24 HRS): SARS Coronavirus 2: NEGATIVE

## 2019-07-03 ENCOUNTER — Encounter
Admission: RE | Disposition: A | Discharge status: Home / Self Care | Payer: Self-pay | Source: Home / Self Care | Attending: Gastroenterology

## 2019-07-03 ENCOUNTER — Ambulatory Visit: Payer: PPO | Admitting: Family

## 2019-07-03 ENCOUNTER — Telehealth (INDEPENDENT_AMBULATORY_CARE_PROVIDER_SITE_OTHER): Payer: Self-pay

## 2019-07-03 ENCOUNTER — Encounter: Payer: Self-pay | Admitting: Gastroenterology

## 2019-07-03 ENCOUNTER — Ambulatory Visit
Admission: RE | Admit: 2019-07-03 | Discharge: 2019-07-03 | Disposition: A | Discharge status: Home / Self Care | Payer: PPO | Attending: Gastroenterology | Admitting: Gastroenterology

## 2019-07-03 ENCOUNTER — Other Ambulatory Visit: Payer: Self-pay

## 2019-07-03 DIAGNOSIS — K253 Acute gastric ulcer without hemorrhage or perforation: Secondary | ICD-10-CM | POA: Insufficient documentation

## 2019-07-03 DIAGNOSIS — Z1211 Encounter for screening for malignant neoplasm of colon: Secondary | ICD-10-CM | POA: Insufficient documentation

## 2019-07-03 DIAGNOSIS — K5710 Diverticulosis of small intestine without perforation or abscess without bleeding: Secondary | ICD-10-CM | POA: Diagnosis not present

## 2019-07-03 DIAGNOSIS — K640 First degree hemorrhoids: Secondary | ICD-10-CM | POA: Insufficient documentation

## 2019-07-03 DIAGNOSIS — Z8711 Personal history of peptic ulcer disease: Secondary | ICD-10-CM | POA: Insufficient documentation

## 2019-07-03 DIAGNOSIS — Z86010 Personal history of colonic polyps: Secondary | ICD-10-CM

## 2019-07-03 DIAGNOSIS — E785 Hyperlipidemia, unspecified: Secondary | ICD-10-CM | POA: Diagnosis not present

## 2019-07-03 DIAGNOSIS — K5790 Diverticulosis of intestine, part unspecified, without perforation or abscess without bleeding: Secondary | ICD-10-CM | POA: Diagnosis not present

## 2019-07-03 DIAGNOSIS — Z886 Allergy status to analgesic agent status: Secondary | ICD-10-CM | POA: Insufficient documentation

## 2019-07-03 DIAGNOSIS — Z79899 Other long term (current) drug therapy: Secondary | ICD-10-CM | POA: Insufficient documentation

## 2019-07-03 DIAGNOSIS — K219 Gastro-esophageal reflux disease without esophagitis: Secondary | ICD-10-CM | POA: Diagnosis not present

## 2019-07-03 DIAGNOSIS — Z7901 Long term (current) use of anticoagulants: Secondary | ICD-10-CM | POA: Diagnosis not present

## 2019-07-03 DIAGNOSIS — K635 Polyp of colon: Secondary | ICD-10-CM

## 2019-07-03 DIAGNOSIS — D126 Benign neoplasm of colon, unspecified: Secondary | ICD-10-CM | POA: Diagnosis not present

## 2019-07-03 DIAGNOSIS — K259 Gastric ulcer, unspecified as acute or chronic, without hemorrhage or perforation: Secondary | ICD-10-CM | POA: Diagnosis not present

## 2019-07-03 DIAGNOSIS — D122 Benign neoplasm of ascending colon: Secondary | ICD-10-CM | POA: Diagnosis not present

## 2019-07-03 DIAGNOSIS — I10 Essential (primary) hypertension: Secondary | ICD-10-CM | POA: Diagnosis not present

## 2019-07-03 DIAGNOSIS — Z87891 Personal history of nicotine dependence: Secondary | ICD-10-CM | POA: Diagnosis not present

## 2019-07-03 DIAGNOSIS — K449 Diaphragmatic hernia without obstruction or gangrene: Secondary | ICD-10-CM | POA: Diagnosis not present

## 2019-07-03 HISTORY — PX: ESOPHAGOGASTRODUODENOSCOPY (EGD) WITH PROPOFOL: SHX5813

## 2019-07-03 HISTORY — PX: COLONOSCOPY WITH PROPOFOL: SHX5780

## 2019-07-03 SURGERY — COLONOSCOPY WITH PROPOFOL
Anesthesia: General

## 2019-07-03 MED ORDER — PHENYLEPHRINE HCL (PRESSORS) 10 MG/ML IV SOLN
INTRAVENOUS | Status: DC | PRN
  Administered 2019-07-03 (×2): 100 ug via INTRAVENOUS

## 2019-07-03 MED ORDER — SODIUM CHLORIDE 0.9 % IV SOLN: INTRAVENOUS | Status: DC

## 2019-07-03 MED ORDER — PROPOFOL 500 MG/50ML IV EMUL
INTRAVENOUS | Status: AC
  Filled 2019-07-03: qty 50

## 2019-07-03 MED ORDER — PROPOFOL 10 MG/ML IV BOLUS
INTRAVENOUS | Status: DC | PRN
  Administered 2019-07-03: 140 ug/kg/min via INTRAVENOUS
  Administered 2019-07-03: 50 mg via INTRAVENOUS

## 2019-07-03 NOTE — Telephone Encounter (Signed)
I spoke to the  pt and made him aware of the NP instructions below and also made him aware he is to take his medicine once in the morning and once in the evening.

## 2019-07-03 NOTE — Telephone Encounter (Signed)
That is fine 

## 2019-07-03 NOTE — Anesthesia Preprocedure Evaluation (Signed)
Anesthesia Evaluation  Patient identified by MRN, date of birth, ID band Patient awake    Reviewed: Allergy & Precautions, NPO status , Patient's Chart, lab work & pertinent test results  History of Anesthesia Complications Negative for: history of anesthetic complications  Airway Mallampati: II       Dental   Pulmonary neg sleep apnea, neg COPD, Not current smoker, former smoker,           Cardiovascular hypertension, Pt. on medications (-) Past MI and (-) CHF (-) dysrhythmias (-) Valvular Problems/Murmurs     Neuro/Psych neg Seizures    GI/Hepatic Neg liver ROS, hiatal hernia, PUD, GERD  Medicated,  Endo/Other  neg diabetes  Renal/GU negative Renal ROS     Musculoskeletal   Abdominal   Peds  Hematology  (+) anemia ,   Anesthesia Other Findings   Reproductive/Obstetrics                             Anesthesia Physical Anesthesia Plan  ASA: II  Anesthesia Plan: General   Post-op Pain Management:    Induction: Intravenous  PONV Risk Score and Plan: 2 and Propofol infusion and TIVA  Airway Management Planned: Nasal Cannula  Additional Equipment:   Intra-op Plan:   Post-operative Plan:   Informed Consent: I have reviewed the patients History and Physical, chart, labs and discussed the procedure including the risks, benefits and alternatives for the proposed anesthesia with the patient or authorized representative who has indicated his/her understanding and acceptance.       Plan Discussed with:   Anesthesia Plan Comments:         Anesthesia Quick Evaluation

## 2019-07-03 NOTE — H&P (Signed)
Marcus Bellows, MD [address], [place], [place], [place], [id number] [place], [place], [place], [address] Phone: [phone]  Fax: [phone]  Primary Care Physician:  Idelle Crouch, MD   Pre-Procedure History & Physical: HPI:  Marcus Coleman is a 79 y.o. male is here for an endoscopy and colonoscopy    Past Medical History:  Diagnosis Date  . Anemia   . Chronic diarrhea   . DDD (degenerative disc disease), cervical   . Hyperlipidemia   . Hypertension    borderline  . IDA (iron deficiency anemia)   . Low testosterone   . Vasovagal syncope     Past Surgical History:  Procedure Laterality Date  . CARDIAC CATHETERIZATION    . catracts Bilateral   . CHOLECYSTECTOMY    . COLONOSCOPY WITH PROPOFOL N/A 06/26/2016   Procedure: COLONOSCOPY WITH PROPOFOL;  Surgeon: Manya Silvas, MD;  Location: [facility] ENDOSCOPY;  Service: Endoscopy;  Laterality: N/A;  . ENDOSCOPIC RETROGRADE CHOLANGIOPANCREATOGRAPHY (ERCP) WITH PROPOFOL    . ESOPHAGOGASTRODUODENOSCOPY N/A 04/19/2019   Procedure: ESOPHAGOGASTRODUODENOSCOPY (EGD);  Surgeon: Marcus Bellows, MD;  Location: [facility] ENDOSCOPY;  Service: Gastroenterology;  Laterality: N/A;  . eyelid surgery    . FOOT SURGERY    . HEMORROIDECTOMY    . HERNIA REPAIR    . mrercp    . RHINOPLASTY    . TONSILLECTOMY    . WISDOM TOOTH EXTRACTION      Prior to Admission medications   Medication Sig Start Date End Date Taking? Authorizing Provider  cetirizine (ZYRTEC) 10 MG tablet Take 10 mg by mouth daily.   Yes [provider]  Multiple Vitamin (MULTIVITAMIN) tablet Take 1 tablet by mouth daily.   Yes [provider]  pantoprazole (PROTONIX) 40 MG tablet Take 1 tablet (40 mg total) by mouth daily. 05/06/19  Yes Marcus Bellows, MD  tamsulosin [facility]) 0.4 MG CAPS capsule Take 1 capsule (0.4 mg total) by mouth daily after breakfast. 11/06/18  Yes Stoioff, Ronda Fairly, MD  timolol (TIMOPTIC) 0.5 % ophthalmic solution Place 1  drop into both eyes daily.  11/21/18  Yes [provider]  vitamin B-12 (CYANOCOBALAMIN) 500 MCG tablet Take 1,000 mcg by mouth daily.    Yes [provider]  vitamin C (ASCORBIC ACID) 500 MG tablet Take 1,000 mg by mouth daily.    Yes [provider]  acetaminophen (TYLENOL) 325 MG tablet Take 650 mg by mouth every 6 (six) hours as needed.    [provider]  apixaban (ELIQUIS) 5 MG TABS tablet Take 1 tablet by mouth 2 (two) times daily.     [provider]  docusate sodium (COLACE) 100 MG capsule Take 1 capsule (100 mg total) by mouth 2 (two) times daily as needed for mild constipation. Patient not taking: Reported on 07/03/2019 04/21/19   Louellen Molder, MD    Allergies as of 06/06/2019 - Review Complete 05/19/2019  Allergen Reaction Noted  . Nsaids  06/23/2016    Family History  Problem Relation Age of Onset  . Hypothyroidism Mother   . Diabetes Mother   . Lung cancer Mother     Social History   Socioeconomic History  . Marital status: Married    Spouse name: Not on file  . Number of children: Not on file  . Years of education: Not on file  . Highest education level: Not on file  Occupational History  . Not on file  Tobacco Use  . Smoking  status: Former Smoker    Packs/day: 1.00    Years: 25.00    Pack years: 25.00    Types: Cigarettes    Quit date: 12/05/1986    Years since quitting: 32.5  . Smokeless tobacco: Never Used  Substance and Sexual Activity  . Alcohol use: Yes    Alcohol/week: 14.0 standard drinks    Types: 14 Cans of beer per week    Comment: 2 beers per day  . Drug use: No  . Sexual activity: Not on file  Other Topics Concern  . Not on file  Social History Narrative  . Not on file   Social Determinants of Health   Financial Resource Strain:   . Difficulty of Paying Living Expenses:   Food Insecurity:   . Worried About Charity fundraiser in the Last Year:   . Arboriculturist in the Last Year:    Transportation Needs:   . Film/video editor (Medical):   Marland Kitchen Lack of Transportation (Non-Medical):   Physical Activity:   . Days of Exercise per Week:   . Minutes of Exercise per Session:   Stress:   . Feeling of Stress :   Social Connections:   . Frequency of Communication with Friends and Family:   . Frequency of Social Gatherings with Friends and Family:   . Attends Religious Services:   . Active Member of Clubs or Organizations:   . Attends Archivist Meetings:   Marland Kitchen Marital Status:   Intimate Partner Violence:   . Fear of Current or Ex-Partner:   . Emotionally Abused:   Marland Kitchen Physically Abused:   . Sexually Abused:     Review of Systems: See HPI, otherwise negative ROS  Physical Exam: There were no vitals taken for this visit. General:   Alert,  pleasant and cooperative in NAD Head:  Normocephalic and atraumatic. Neck:  Supple; no masses or thyromegaly. Lungs:  Clear throughout to auscultation, normal respiratory effort.    Heart:  +S1, +S2, Regular rate and rhythm, No edema. Abdomen:  Soft, nontender and nondistended. Normal bowel sounds, without guarding, and without rebound.   Neurologic:  Alert and  oriented x4;  grossly normal neurologically.  Impression/Plan: Marcus Coleman is here for an endoscopy and colonoscopy  to be performed for  evaluation of gastric ulcers and surveillance due to prior colon polyp     Risks, benefits, limitations, and alternatives regarding endoscopy have been reviewed with the patient.  Questions have been answered.  All parties agreeable.   Marcus Bellows, MD  07/03/2019, 8:05 AM \

## 2019-07-03 NOTE — Op Note (Signed)
[facility] Gastroenterology Patient Name: Marcus Coleman Procedure Date: 07/03/2019 7:40 AM MRN: [id number] Account #: [id number] Date of Birth: [birth year] Admit Type: Outpatient Age: 79 Room: [facility] ENDO ROOM 1 Gender: Male Note Status: Finalized Procedure:             Colonoscopy Indications:           High risk colon cancer surveillance: Personal history                         of colonic polyps Providers:             [name] MD, MD Medicines:             Propofol per Anesthesia, Monitored Anesthesia Care Complications:         No immediate complications. Procedure:             Pre-Anesthesia Assessment:                        - Prior to the procedure, a History and Physical was                         performed, and patient medications, allergies and                         sensitivities were reviewed. The patient's tolerance                         of previous anesthesia was reviewed.                        - The risks and benefits of the procedure and the                         sedation options and risks were discussed with the                         patient. All questions were answered and informed                         consent was obtained.                        - ASA Grade Assessment: III - A patient with severe                         systemic disease.                        After obtaining informed consent, the colonoscope was                         passed under direct vision. Throughout the procedure,                         the patient's blood pressure, pulse, and oxygen                         saturations were monitored continuously. The  Colonoscope was introduced through the anus and                         advanced to the the cecum, identified by the                         appendiceal orifice. The colonoscopy was performed                         with ease. The patient tolerated the procedure well.       The quality of the bowel preparation was excellent. Findings:      Two sessile polyps were found in the ascending colon. The polyps were 6       to 10 mm in size. These polyps were removed with a cold snare. Resection       and retrieval were complete.      The exam was otherwise without abnormality on direct and retroflexion       views.      Non-bleeding internal hemorrhoids were found during retroflexion. The       hemorrhoids were large and Grade I (internal hemorrhoids that do not       prolapse).      The exam was otherwise without abnormality on direct and retroflexion       views. Impression:            - Two 6 to 10 mm polyps in the ascending colon,                         removed with a cold snare. Resected and retrieved.                        - The examination was otherwise normal on direct and                         retroflexion views. Recommendation:        - Discharge patient to home (with escort).                        - Resume previous diet.                        - Continue present medications.                        - Await pathology results.                        - Return to my office in 3 months. Procedure Code(s):     --- Professional ---                        [phone], Colonoscopy, flexible; with removal of                         tumor(s), polyp(s), or other lesion(s) by snare                         technique Diagnosis Code(s):     --- Professional ---  Z86.010, Personal history of colonic polyps                        K63.5, Polyp of colon CPT copyright 2019 American Medical Association. All rights reserved. The codes documented in this report are preliminary and upon coder review may  be revised to meet current compliance requirements. [name], MD [name] MD, MD 07/03/2019 8:48:51 AM This report has been signed electronically. Number of Addenda: 0 Note Initiated On: 07/03/2019 7:40 AM Scope Withdrawal Time: 0 hours 12  minutes 36 seconds  Total Procedure Duration: 0 hours 14 minutes 45 seconds  Estimated Blood Loss:  Estimated blood loss: none. Estimated blood loss: none.      [facility]

## 2019-07-03 NOTE — Telephone Encounter (Signed)
The pt called and  Wanted to know if he should start back on his elliquis  and  He has had his colonoscopy yesterday and is doing fine.

## 2019-07-03 NOTE — Op Note (Signed)
[facility] Gastroenterology Patient Name: Marcus Coleman Procedure Date: 07/03/2019 7:39 AM MRN: [id number] Account #: [id number] Date of Birth: [birth year] Admit Type: Outpatient Age: 79 Room: [facility] ENDO ROOM 1 Gender: Male Note Status: Finalized Procedure:             Upper GI endoscopy Indications:           Follow-up of acute gastric ulcer Providers:             [name] MD, MD Referring MD:          Leonie Douglas. Doy Hutching, MD (Referring MD) Medicines:             Monitored Anesthesia Care Complications:         No immediate complications. Procedure:             Pre-Anesthesia Assessment:                        - Prior to the procedure, a History and Physical was                         performed, and patient medications, allergies and                         sensitivities were reviewed. The patient's tolerance                         of previous anesthesia was reviewed.                        - The risks and benefits of the procedure and the                         sedation options and risks were discussed with the                         patient. All questions were answered and informed                         consent was obtained.                        - ASA Grade Assessment: III - A patient with severe                         systemic disease.                        After obtaining informed consent, the endoscope was                         passed under direct vision. Throughout the procedure,                         the patient's blood pressure, pulse, and oxygen                         saturations were monitored continuously. The Endoscope                         was introduced through  the mouth, and advanced to the                         third part of duodenum. The upper GI endoscopy was                         accomplished with ease. The patient tolerated the                         procedure well. Findings:      The esophagus was normal.      A large  hiatal hernia was present. LA 2 cm submucosal lesion seen at the       9 o clock position within the hiatal hernia , normal overlying mucosa ?       GIST      A large non-bleeding diverticulum was found in the third portion of the       duodenum. Impression:            - Normal esophagus.                        - Large hiatal hernia.                        - Non-bleeding duodenal diverticulum.                        - No specimens collected. Recommendation:        - Perform an upper endoscopic ultrasound (UEUS) in 6                         weeks.                        - Perform a colonoscopy tomorrow. Procedure Code(s):     --- Professional ---                        [phone], Esophagogastroduodenoscopy, flexible,                         transoral; diagnostic, including collection of                         specimen(s) by brushing or washing, when performed                         (separate procedure) Diagnosis Code(s):     --- Professional ---                        K44.9, Diaphragmatic hernia without obstruction or                         gangrene                        K25.3, Acute gastric ulcer without hemorrhage or                         perforation  K57.10, Diverticulosis of small intestine without                         perforation or abscess without bleeding CPT copyright 2019 American Medical Association. All rights reserved. The codes documented in this report are preliminary and upon coder review may  be revised to meet current compliance requirements. [name], MD [name] MD, MD 07/03/2019 8:30:33 AM This report has been signed electronically. Number of Addenda: 0 Note Initiated On: 07/03/2019 7:39 AM Estimated Blood Loss:  Estimated blood loss: none.      [facility]

## 2019-07-03 NOTE — Anesthesia Postprocedure Evaluation (Signed)
Anesthesia Post Note  Patient: Marcus Coleman  Procedure(s) Performed: COLONOSCOPY WITH PROPOFOL (N/A ) ESOPHAGOGASTRODUODENOSCOPY (EGD) WITH PROPOFOL (N/A )  Patient location during evaluation: Endoscopy Anesthesia Type: General Level of consciousness: awake and alert Pain management: pain level controlled Vital Signs Assessment: post-procedure vital signs reviewed and stable Respiratory status: spontaneous breathing and respiratory function stable Cardiovascular status: stable Anesthetic complications: no     Last Vitals:  Vitals:   07/03/19 0902 07/03/19 0912  BP: 106/86 125/75  Pulse: 78 63  Resp: 16 14  Temp:    SpO2: 98% 98%    Last Pain:  Vitals:   07/03/19 0912  TempSrc:   PainSc: 0-No pain                 Heddy Vidana K

## 2019-07-03 NOTE — Transfer of Care (Signed)
Immediate Anesthesia Transfer of Care Note  Patient: Marcus Coleman  Procedure(s) Performed: COLONOSCOPY WITH PROPOFOL (N/A ) ESOPHAGOGASTRODUODENOSCOPY (EGD) WITH PROPOFOL (N/A )  Patient Location: PACU  Anesthesia Type:General  Level of Consciousness: awake, alert , drowsy and patient cooperative  Airway & Oxygen Therapy: Patient Spontanous Breathing and Patient connected to nasal cannula oxygen  Post-op Assessment: Report given to RN and Post -op Vital signs reviewed and stable  Post vital signs: Reviewed and stable  Last Vitals:  Vitals Value Taken Time  BP 111/77 07/03/19 0856  Temp 36.6 C 07/03/19 0852  Pulse 72 07/03/19 0857  Resp 16 07/03/19 0857  SpO2 96 % 07/03/19 0857  Vitals shown include unvalidated device data.  Last Pain:  Vitals:   07/03/19 0852  TempSrc: Temporal  PainSc: 0-No pain         Complications: No apparent anesthesia complications

## 2019-07-04 ENCOUNTER — Telehealth: Payer: Self-pay

## 2019-07-04 ENCOUNTER — Other Ambulatory Visit: Payer: Self-pay

## 2019-07-04 ENCOUNTER — Encounter: Payer: Self-pay | Admitting: *Deleted

## 2019-07-04 DIAGNOSIS — K259 Gastric ulcer, unspecified as acute or chronic, without hemorrhage or perforation: Secondary | ICD-10-CM

## 2019-07-04 LAB — SURGICAL PATHOLOGY

## 2019-07-04 NOTE — Telephone Encounter (Signed)
Left message on machine to call back  

## 2019-07-04 NOTE — Telephone Encounter (Signed)
Milus Banister, MD  [name], RN; Mansouraty, Telford Nab., MD  Tamea Bai,  He needs upper endoscopic ultrasound first available with either myself or Gabe for gastric subepithelial lesion, incidental. Thank you   Wynetta Fines

## 2019-07-06 ENCOUNTER — Encounter: Payer: Self-pay | Admitting: Gastroenterology

## 2019-07-07 ENCOUNTER — Other Ambulatory Visit: Payer: Self-pay

## 2019-07-07 DIAGNOSIS — K259 Gastric ulcer, unspecified as acute or chronic, without hemorrhage or perforation: Secondary | ICD-10-CM

## 2019-07-07 NOTE — Telephone Encounter (Signed)
The pt has been scheduled for EUS on 08/07/19 at 830 am and COVID test on 08/04/19 at 1010 am.  I have also sent a letter to  PCP and Cardiology regarding Eliquis.  Instructions in Epic

## 2019-07-08 NOTE — Telephone Encounter (Signed)
The pt has been instructed and states that he is not on Eliquis but however, he is taking lovenox injections at home managed by Dr Vicente Males.  I have sent a letter to Dr Vicente Males for instructions.

## 2019-07-22 DIAGNOSIS — M9903 Segmental and somatic dysfunction of lumbar region: Secondary | ICD-10-CM | POA: Diagnosis not present

## 2019-07-22 DIAGNOSIS — M955 Acquired deformity of pelvis: Secondary | ICD-10-CM | POA: Diagnosis not present

## 2019-07-22 DIAGNOSIS — M5432 Sciatica, left side: Secondary | ICD-10-CM | POA: Diagnosis not present

## 2019-07-22 DIAGNOSIS — M9905 Segmental and somatic dysfunction of pelvic region: Secondary | ICD-10-CM | POA: Diagnosis not present

## 2019-07-24 ENCOUNTER — Telehealth: Payer: Self-pay

## 2019-07-24 DIAGNOSIS — M955 Acquired deformity of pelvis: Secondary | ICD-10-CM | POA: Diagnosis not present

## 2019-07-24 DIAGNOSIS — M9905 Segmental and somatic dysfunction of pelvic region: Secondary | ICD-10-CM | POA: Diagnosis not present

## 2019-07-24 DIAGNOSIS — M9903 Segmental and somatic dysfunction of lumbar region: Secondary | ICD-10-CM | POA: Diagnosis not present

## 2019-07-24 DIAGNOSIS — M5432 Sciatica, left side: Secondary | ICD-10-CM | POA: Diagnosis not present

## 2019-07-24 NOTE — Telephone Encounter (Signed)
The pt states that he is no longer on any blood thinner. No response needed.

## 2019-07-24 NOTE — Telephone Encounter (Signed)
-----   Message from [name], RN sent at 07/08/2019 10:15 AM EDT ----- Pt to stop lovenox prior to 5/27 letter sent to Dr Marylene Land

## 2019-07-28 DIAGNOSIS — M955 Acquired deformity of pelvis: Secondary | ICD-10-CM | POA: Diagnosis not present

## 2019-07-28 DIAGNOSIS — M9905 Segmental and somatic dysfunction of pelvic region: Secondary | ICD-10-CM | POA: Diagnosis not present

## 2019-07-28 DIAGNOSIS — M9903 Segmental and somatic dysfunction of lumbar region: Secondary | ICD-10-CM | POA: Diagnosis not present

## 2019-07-28 DIAGNOSIS — M5432 Sciatica, left side: Secondary | ICD-10-CM | POA: Diagnosis not present

## 2019-07-30 ENCOUNTER — Telehealth: Payer: Self-pay | Admitting: Gastroenterology

## 2019-07-30 NOTE — Telephone Encounter (Signed)
appt changed to 1:55 pm - pt notified

## 2019-08-04 ENCOUNTER — Other Ambulatory Visit (HOSPITAL_COMMUNITY)
Admission: RE | Admit: 2019-08-04 | Discharge: 2019-08-04 | Disposition: A | Discharge status: Home / Self Care | Payer: PPO | Source: Ambulatory Visit | Attending: Gastroenterology | Admitting: Gastroenterology

## 2019-08-04 ENCOUNTER — Other Ambulatory Visit (HOSPITAL_COMMUNITY): Payer: PPO

## 2019-08-04 DIAGNOSIS — Z20822 Contact with and (suspected) exposure to covid-19: Secondary | ICD-10-CM | POA: Insufficient documentation

## 2019-08-04 DIAGNOSIS — Z01812 Encounter for preprocedural laboratory examination: Secondary | ICD-10-CM | POA: Diagnosis not present

## 2019-08-04 LAB — SARS CORONAVIRUS 2 (TAT 6-24 HRS): SARS Coronavirus 2: NEGATIVE

## 2019-08-06 ENCOUNTER — Encounter (HOSPITAL_COMMUNITY): Payer: Self-pay | Admitting: Gastroenterology

## 2019-08-06 NOTE — Anesthesia Preprocedure Evaluation (Addendum)
Anesthesia Evaluation  Patient identified by MRN, date of birth, ID band Patient awake    Reviewed: Allergy & Precautions, NPO status , Patient's Chart, lab work & pertinent test results  Airway Mallampati: II  TM Distance: >3 FB Neck ROM: Full    Dental  (+) Caps, Chipped   Pulmonary former smoker,    Pulmonary exam normal breath sounds clear to auscultation       Cardiovascular hypertension, Pt. on medications + DVT  Normal cardiovascular exam+ Valvular Problems/Murmurs MR  Rhythm:Regular Rate:Normal     Neuro/Psych Glaucoma  Neuromuscular disease negative psych ROS   GI/Hepatic hiatal hernia, GERD  Medicated and Controlled,Gastric lesion   Endo/Other  Hyperlipidemia Low testosterone Obesity  Renal/GU   negative genitourinary   Musculoskeletal  (+) Arthritis , Osteoarthritis,  DDD cervical spine   Abdominal (+) + obese,   Peds  Hematology  (+) anemia ,   Anesthesia Other Findings   Reproductive/Obstetrics                            Anesthesia Physical Anesthesia Plan  ASA: II  Anesthesia Plan: MAC   Post-op Pain Management:    Induction: Intravenous  PONV Risk Score and Plan: 1 and Propofol infusion and Ondansetron  Airway Management Planned: Natural Airway and Nasal Cannula  Additional Equipment:   Intra-op Plan:   Post-operative Plan: Extubation in OR  Informed Consent: I have reviewed the patients History and Physical, chart, labs and discussed the procedure including the risks, benefits and alternatives for the proposed anesthesia with the patient or authorized representative who has indicated his/her understanding and acceptance.     Dental advisory given  Plan Discussed with: CRNA and Surgeon  Anesthesia Plan Comments:        Anesthesia Quick Evaluation

## 2019-08-07 ENCOUNTER — Encounter (HOSPITAL_COMMUNITY)
Admission: RE | Disposition: A | Discharge status: Home / Self Care | Payer: Self-pay | Source: Home / Self Care | Attending: Gastroenterology

## 2019-08-07 ENCOUNTER — Ambulatory Visit (HOSPITAL_COMMUNITY)
Admission: RE | Admit: 2019-08-07 | Discharge: 2019-08-07 | Disposition: A | Discharge status: Home / Self Care | Payer: PPO | Attending: Gastroenterology | Admitting: Gastroenterology

## 2019-08-07 ENCOUNTER — Ambulatory Visit (HOSPITAL_COMMUNITY): Payer: PPO | Admitting: Anesthesiology

## 2019-08-07 ENCOUNTER — Encounter (HOSPITAL_COMMUNITY): Payer: Self-pay | Admitting: Gastroenterology

## 2019-08-07 ENCOUNTER — Other Ambulatory Visit: Payer: Self-pay

## 2019-08-07 DIAGNOSIS — Z888 Allergy status to other drugs, medicaments and biological substances status: Secondary | ICD-10-CM | POA: Insufficient documentation

## 2019-08-07 DIAGNOSIS — D509 Iron deficiency anemia, unspecified: Secondary | ICD-10-CM | POA: Diagnosis not present

## 2019-08-07 DIAGNOSIS — E669 Obesity, unspecified: Secondary | ICD-10-CM | POA: Insufficient documentation

## 2019-08-07 DIAGNOSIS — E785 Hyperlipidemia, unspecified: Secondary | ICD-10-CM | POA: Diagnosis not present

## 2019-08-07 DIAGNOSIS — K219 Gastro-esophageal reflux disease without esophagitis: Secondary | ICD-10-CM | POA: Diagnosis not present

## 2019-08-07 DIAGNOSIS — K529 Noninfective gastroenteritis and colitis, unspecified: Secondary | ICD-10-CM | POA: Insufficient documentation

## 2019-08-07 DIAGNOSIS — Z87891 Personal history of nicotine dependence: Secondary | ICD-10-CM | POA: Diagnosis not present

## 2019-08-07 DIAGNOSIS — Z9049 Acquired absence of other specified parts of digestive tract: Secondary | ICD-10-CM | POA: Diagnosis not present

## 2019-08-07 DIAGNOSIS — K3189 Other diseases of stomach and duodenum: Secondary | ICD-10-CM | POA: Diagnosis not present

## 2019-08-07 DIAGNOSIS — Z86718 Personal history of other venous thrombosis and embolism: Secondary | ICD-10-CM | POA: Insufficient documentation

## 2019-08-07 DIAGNOSIS — Z801 Family history of malignant neoplasm of trachea, bronchus and lung: Secondary | ICD-10-CM | POA: Diagnosis not present

## 2019-08-07 DIAGNOSIS — I10 Essential (primary) hypertension: Secondary | ICD-10-CM | POA: Insufficient documentation

## 2019-08-07 DIAGNOSIS — Z8349 Family history of other endocrine, nutritional and metabolic diseases: Secondary | ICD-10-CM | POA: Insufficient documentation

## 2019-08-07 DIAGNOSIS — M1990 Unspecified osteoarthritis, unspecified site: Secondary | ICD-10-CM | POA: Insufficient documentation

## 2019-08-07 DIAGNOSIS — K449 Diaphragmatic hernia without obstruction or gangrene: Secondary | ICD-10-CM | POA: Diagnosis not present

## 2019-08-07 DIAGNOSIS — Z6830 Body mass index (BMI) 30.0-30.9, adult: Secondary | ICD-10-CM | POA: Diagnosis not present

## 2019-08-07 DIAGNOSIS — M5030 Other cervical disc degeneration, unspecified cervical region: Secondary | ICD-10-CM | POA: Insufficient documentation

## 2019-08-07 DIAGNOSIS — K259 Gastric ulcer, unspecified as acute or chronic, without hemorrhage or perforation: Secondary | ICD-10-CM

## 2019-08-07 DIAGNOSIS — Z833 Family history of diabetes mellitus: Secondary | ICD-10-CM | POA: Insufficient documentation

## 2019-08-07 DIAGNOSIS — Z8711 Personal history of peptic ulcer disease: Secondary | ICD-10-CM | POA: Diagnosis not present

## 2019-08-07 DIAGNOSIS — G709 Myoneural disorder, unspecified: Secondary | ICD-10-CM | POA: Insufficient documentation

## 2019-08-07 DIAGNOSIS — H409 Unspecified glaucoma: Secondary | ICD-10-CM | POA: Diagnosis not present

## 2019-08-07 DIAGNOSIS — R55 Syncope and collapse: Secondary | ICD-10-CM | POA: Insufficient documentation

## 2019-08-07 DIAGNOSIS — D175 Benign lipomatous neoplasm of intra-abdominal organs: Secondary | ICD-10-CM | POA: Diagnosis not present

## 2019-08-07 HISTORY — PX: EUS: SHX5427

## 2019-08-07 HISTORY — PX: ESOPHAGOGASTRODUODENOSCOPY (EGD) WITH PROPOFOL: SHX5813

## 2019-08-07 SURGERY — UPPER ENDOSCOPIC ULTRASOUND (EUS) RADIAL
Anesthesia: Monitor Anesthesia Care

## 2019-08-07 MED ORDER — PROPOFOL 500 MG/50ML IV EMUL
INTRAVENOUS | Status: AC
  Filled 2019-08-07: qty 50

## 2019-08-07 MED ORDER — PROPOFOL 500 MG/50ML IV EMUL
INTRAVENOUS | Status: DC | PRN
  Administered 2019-08-07: 120 ug/kg/min via INTRAVENOUS

## 2019-08-07 MED ORDER — SODIUM CHLORIDE 0.9 % IV SOLN: INTRAVENOUS | Status: DC

## 2019-08-07 MED ORDER — LACTATED RINGERS IV SOLN: INTRAVENOUS | Status: DC

## 2019-08-07 MED ORDER — PROPOFOL 10 MG/ML IV BOLUS
INTRAVENOUS | Status: DC | PRN
  Administered 2019-08-07: 40 mg via INTRAVENOUS
  Administered 2019-08-07: 20 mg via INTRAVENOUS

## 2019-08-07 MED ORDER — ONDANSETRON HCL 4 MG/2ML IJ SOLN
INTRAMUSCULAR | Status: DC | PRN
  Administered 2019-08-07: 4 mg via INTRAVENOUS

## 2019-08-07 SURGICAL SUPPLY — 14 items

## 2019-08-07 NOTE — Op Note (Signed)
[facility] Patient Name: Marcus Coleman Procedure Date: 08/07/2019 MRN: [id number] Attending MD: Milus Banister , MD Date of Birth: 14-Dec-1940 CSN: [id number] Age: 79 Admit Type: Outpatient Procedure:                Upper EUS Indications:              EGD 06/2019 [name] described a submcucosal lesion                            the [facility] segment of his stomach. Providers:                Milus Banister, MD, Josie Dixon, RN, Wynonia Sours, RN, Theodora Blow, Technician Referring MD:             [name], MD Medicines:                Monitored Anesthesia Care Complications:            No immediate complications. Estimated blood loss:                            None. Estimated Blood Loss:     Estimated blood loss: none. Procedure:                Pre-Anesthesia Assessment:                           - Prior to the procedure, a History and Physical                            was performed, and patient medications and                            allergies were reviewed. The patient's tolerance of                            previous anesthesia was also reviewed. The risks                            and benefits of the procedure and the sedation                            options and risks were discussed with the patient.                            All questions were answered, and informed consent                            was obtained. Prior Anticoagulants: The patient has                            taken no previous anticoagulant or antiplatelet  agents. ASA Grade Assessment: II - A patient with                            mild systemic disease. After reviewing the risks                            and benefits, the patient was deemed in                            satisfactory condition to undergo the procedure.                           After obtaining informed consent, the endoscope was                            passed  under direct vision. Throughout the                            procedure, the patient's blood pressure, pulse, and                            oxygen saturations were monitored continuously. The                            [id number]-H190 [id number]) Olympus gastroscope was                            introduced through the mouth, and advanced to the                            second part of duodenum. The GF-UTC180 [id number])                            Olympus linear EUS was introduced through the                            mouth, and advanced to the second part of duodenum.                            The [id number] [id number]) Olympus Linear EUS was                            introduced through the mouth, and advanced to the                            antrum of the stomach. The upper EUS was                            accomplished without difficulty. The patient                            tolerated the procedure well. Scope In: Scope Out: Findings:      Endoscopic findings (gastroscope, radial EUS, linear EUS):  1 Large hiatal hernia, about 1/2 of his stomach resides in his chest.      2. Soft subepithelial nodule along the greater curvature of the stomach,       within the [facility] segment. This measures 1-2cm across endoscopically.      ENDOSONOGRAPHIC FINDING: :      1. The subepithelial lesion described above correlated with a 32mm,       round, well circumscribed hypererchoic lesion that lays within the deep       mucosa, submucosa layers of the gastric wall.      2. Gastric wall was otherwise normal.      3. No perigastric adenopathy.      4. Limited views of the liver, pancreas were all normal. Impression:               - 1cm gastric lipoma within the hiatal hernia                            segment. This is not clinically significant and no                            further testing is necessary. Moderate Sedation:      Not Applicable - Patient had care per Anesthesia. Recommendation:            - Discharge patient to home.                           - Follow up with [name] as needed. Procedure Code(s):        --- Professional ---                           [phone], Esophagogastroduodenoscopy, flexible,                            transoral; with endoscopic ultrasound examination                            limited to the esophagus, stomach or duodenum, and                            adjacent structures Diagnosis Code(s):        --- Professional ---                           D17.5, Benign lipomatous neoplasm of                            intra-abdominal organs                           K31.89, Other diseases of stomach and duodenum CPT copyright 2019 American Medical Association. All rights reserved. The codes documented in this report are preliminary and upon coder review may  be revised to meet current compliance requirements. Milus Banister, MD 08/07/2019 8:41:11 AM This report has been signed electronically. Number of Addenda: 0

## 2019-08-07 NOTE — Transfer of Care (Signed)
Immediate Anesthesia Transfer of Care Note  Patient: ANOOP HEMMER  Procedure(s) Performed: Procedure(s): UPPER ENDOSCOPIC ULTRASOUND (EUS) RADIAL (N/A) ESOPHAGOGASTRODUODENOSCOPY (EGD) WITH PROPOFOL (N/A)  Patient Location: PACU  Anesthesia Type:MAC  Level of Consciousness:  sedated, patient cooperative and responds to stimulation  Airway & Oxygen Therapy:Patient Spontanous Breathing and Patient connected to face mask oxgen  Post-op Assessment:  Report given to PACU RN and Post -op Vital signs reviewed and stable  Post vital signs:  Reviewed and stable  Last Vitals:  Vitals:   08/07/19 0739  BP: (!) 170/85  Pulse: (!) 53  Resp: 18  Temp: 36.6 C  SpO2: 887%    Complications: No apparent anesthesia complications

## 2019-08-07 NOTE — H&P (Addendum)
HPI: This is a 79 yo man found to have incidental "LA 2 cm submucosal lesion seen at the 9 o clock position within the hiatal hernia, normal overlying mucosa, ? GIST" by Dr. [name] during EGD 07/03/2019 for follow up gastric ulcer that had been seen about 2 months prior by same provider.  ROS: complete GI ROS as described in HPI, all other review negative.  Constitutional:  No unintentional weight loss   Past Medical History:  Diagnosis Date  . Anemia   . Chronic diarrhea   . DDD (degenerative disc disease), cervical   . Hyperlipidemia   . Hypertension    borderline  . IDA (iron deficiency anemia)   . Low testosterone   . Vasovagal syncope     Past Surgical History:  Procedure Laterality Date  . CARDIAC CATHETERIZATION    . catracts Bilateral   . CHOLECYSTECTOMY    . COLONOSCOPY WITH PROPOFOL N/A 06/26/2016   Procedure: COLONOSCOPY WITH PROPOFOL;  Surgeon: Manya Silvas, MD;  Location: [facility] ENDOSCOPY;  Service: Endoscopy;  Laterality: N/A;  . COLONOSCOPY WITH PROPOFOL N/A 07/03/2019   Procedure: COLONOSCOPY WITH PROPOFOL;  Surgeon: [name], MD;  Location: [facility] ENDOSCOPY;  Service: Gastroenterology;  Laterality: N/A;  . ENDOSCOPIC RETROGRADE CHOLANGIOPANCREATOGRAPHY (ERCP) WITH PROPOFOL    . ESOPHAGOGASTRODUODENOSCOPY N/A 04/19/2019   Procedure: ESOPHAGOGASTRODUODENOSCOPY (EGD);  Surgeon: [name], MD;  Location: [facility] ENDOSCOPY;  Service: Gastroenterology;  Laterality: N/A;  . ESOPHAGOGASTRODUODENOSCOPY (EGD) WITH PROPOFOL N/A 07/03/2019   Procedure: ESOPHAGOGASTRODUODENOSCOPY (EGD) WITH PROPOFOL;  Surgeon: [name], MD;  Location: [facility] ENDOSCOPY;  Service: Gastroenterology;  Laterality: N/A;  . eyelid surgery    . FOOT SURGERY    . HEMORROIDECTOMY    . HERNIA REPAIR    . mrercp    . RHINOPLASTY    . TONSILLECTOMY    . WISDOM TOOTH EXTRACTION      No current facility-administered medications for this encounter.    Allergies as of 07/07/2019 - Review  Complete 07/03/2019  Allergen Reaction Noted  . Nsaids  06/23/2016    Family History  Problem Relation Age of Onset  . Hypothyroidism Mother   . Diabetes Mother   . Lung cancer Mother     Social History   Socioeconomic History  . Marital status: Married    Spouse name: Not on file  . Number of children: Not on file  . Years of education: Not on file  . Highest education level: Not on file  Occupational History  . Not on file  Tobacco Use  . Smoking status: Former Smoker    Packs/day: 1.00    Years: 25.00    Pack years: 25.00    Types: Cigarettes    Quit date: 12/05/1986    Years since quitting: 32.6  . Smokeless tobacco: Never Used  Substance and Sexual Activity  . Alcohol use: Yes    Alcohol/week: 14.0 standard drinks    Types: 14 Cans of beer per week    Comment: 2 beers per day  . Drug use: No  . Sexual activity: Not on file  Other Topics Concern  . Not on file  Social History Narrative  . Not on file   Social Determinants of Health   Financial Resource Strain:   . Difficulty of Paying Living Expenses:   Food Insecurity:   . Worried About Charity fundraiser in the Last Year:   . Arboriculturist in the Last Year:   Transportation Needs:   .  Lack of Transportation (Medical):   Marland Kitchen Lack of Transportation (Non-Medical):   Physical Activity:   . Days of Exercise per Week:   . Minutes of Exercise per Session:   Stress:   . Feeling of Stress :   Social Connections:   . Frequency of Communication with Friends and Family:   . Frequency of Social Gatherings with Friends and Family:   . Attends Religious Services:   . Active Member of Clubs or Organizations:   . Attends Archivist Meetings:   Marland Kitchen Marital Status:   Intimate Partner Violence:   . Fear of Current or Ex-Partner:   . Emotionally Abused:   Marland Kitchen Physically Abused:   . Sexually Abused:      Physical Exam: Ht 5\' 6"  (1.676 m)   Wt 88.5 kg   BMI 31.47 kg/m  Constitutional: generally  well-appearing Psychiatric: alert and oriented x3 Abdomen: soft, nontender, nondistended, no obvious ascites, no peritoneal signs, normal bowel sounds No peripheral edema noted in lower extremities  Assessment and plan: 79 y.o. male with incidental proximal gastric mass  For EGD and EUS efvaluation today.  Please see the "Patient Instructions" section for addition details about the plan.  Owens Loffler, MD [facility] 08/07/2019, 7:07 AM

## 2019-08-07 NOTE — Discharge Instructions (Signed)
YOU HAD AN ENDOSCOPIC PROCEDURE TODAY: Refer to the procedure report and other information in the discharge instructions given to you for any specific questions about what was found during the examination. If this information does not answer your questions, please call [place] office at [phone] to clarify.   YOU SHOULD EXPECT: Some feelings of bloating in the abdomen. Passage of more gas than usual. Walking can help get rid of the air that was put into your GI tract during the procedure and reduce the bloating. If you had a lower endoscopy (such as a colonoscopy or flexible sigmoidoscopy) you may notice spotting of blood in your stool or on the toilet paper. Some abdominal soreness may be present for a day or two, also.  DIET: Your first meal following the procedure should be a light meal and then it is ok to progress to your normal diet. A half-sandwich or bowl of soup is an example of a good first meal. Heavy or fried foods are harder to digest and may make you feel nauseous or bloated. Drink plenty of fluids but you should avoid alcoholic beverages for 24 hours. If you had a esophageal dilation, please see attached instructions for diet.    ACTIVITY: Your care partner should take you home directly after the procedure. You should plan to take it easy, moving slowly for the rest of the day. You can resume normal activity the day after the procedure however YOU SHOULD NOT DRIVE, use power tools, machinery or perform tasks that involve climbing or major physical exertion for 24 hours (because of the sedation medicines used during the test).   SYMPTOMS TO REPORT IMMEDIATELY: A gastroenterologist can be reached at any hour. Please call [phone]  for any of the following symptoms:   Following upper endoscopy (EGD, EUS, ERCP, esophageal dilation) Vomiting of blood or coffee ground material  New, significant abdominal pain  New, significant chest pain or pain under the shoulder blades  Painful or  persistently difficult swallowing  New shortness of breath  Black, tarry-looking or red, bloody stools  FOLLOW UP:  If any biopsies were taken you will be contacted by phone or by letter within the next 1-3 weeks. Call [phone]  if you have not heard about the biopsies in 3 weeks.  Please also call with any specific questions about appointments or follow up tests.  

## 2019-08-07 NOTE — Anesthesia Postprocedure Evaluation (Signed)
Anesthesia Post Note  Patient: Marcus Coleman  Procedure(s) Performed: UPPER ENDOSCOPIC ULTRASOUND (EUS) RADIAL (N/A ) ESOPHAGOGASTRODUODENOSCOPY (EGD) WITH PROPOFOL (N/A )     Patient location during evaluation: PACU Anesthesia Type: MAC Level of consciousness: awake and alert and oriented Pain management: pain level controlled Vital Signs Assessment: post-procedure vital signs reviewed and stable Respiratory status: spontaneous breathing, nonlabored ventilation and respiratory function stable Cardiovascular status: stable and blood pressure returned to baseline Postop Assessment: no apparent nausea or vomiting Anesthetic complications: no    Last Vitals:  Vitals:   08/07/19 0850 08/07/19 0900  BP: (!) 128/58 128/67  Pulse: (!) 53 (!) 52  Resp: 18 16  Temp:    SpO2: 92% 94%    Last Pain:  Vitals:   08/07/19 0900  TempSrc:   PainSc: 0-No pain                 Charyl Minervini A.

## 2019-08-12 ENCOUNTER — Encounter: Payer: Self-pay | Admitting: *Deleted

## 2019-08-18 DIAGNOSIS — R030 Elevated blood-pressure reading, without diagnosis of hypertension: Secondary | ICD-10-CM | POA: Diagnosis not present

## 2019-08-18 DIAGNOSIS — D509 Iron deficiency anemia, unspecified: Secondary | ICD-10-CM | POA: Diagnosis not present

## 2019-08-25 DIAGNOSIS — D509 Iron deficiency anemia, unspecified: Secondary | ICD-10-CM | POA: Diagnosis not present

## 2019-08-25 DIAGNOSIS — Z79899 Other long term (current) drug therapy: Secondary | ICD-10-CM | POA: Diagnosis not present

## 2019-08-25 DIAGNOSIS — E7800 Pure hypercholesterolemia, unspecified: Secondary | ICD-10-CM | POA: Diagnosis not present

## 2019-08-25 DIAGNOSIS — I340 Nonrheumatic mitral (valve) insufficiency: Secondary | ICD-10-CM | POA: Diagnosis not present

## 2019-08-26 DIAGNOSIS — M955 Acquired deformity of pelvis: Secondary | ICD-10-CM | POA: Diagnosis not present

## 2019-08-26 DIAGNOSIS — M5432 Sciatica, left side: Secondary | ICD-10-CM | POA: Diagnosis not present

## 2019-08-26 DIAGNOSIS — M9905 Segmental and somatic dysfunction of pelvic region: Secondary | ICD-10-CM | POA: Diagnosis not present

## 2019-08-26 DIAGNOSIS — M9903 Segmental and somatic dysfunction of lumbar region: Secondary | ICD-10-CM | POA: Diagnosis not present

## 2019-09-23 DIAGNOSIS — M9903 Segmental and somatic dysfunction of lumbar region: Secondary | ICD-10-CM | POA: Diagnosis not present

## 2019-09-23 DIAGNOSIS — M9905 Segmental and somatic dysfunction of pelvic region: Secondary | ICD-10-CM | POA: Diagnosis not present

## 2019-09-23 DIAGNOSIS — M5432 Sciatica, left side: Secondary | ICD-10-CM | POA: Diagnosis not present

## 2019-09-23 DIAGNOSIS — M955 Acquired deformity of pelvis: Secondary | ICD-10-CM | POA: Diagnosis not present

## 2019-10-03 ENCOUNTER — Other Ambulatory Visit: Payer: Self-pay

## 2019-10-03 ENCOUNTER — Inpatient Hospital Stay: Payer: PPO

## 2019-10-03 ENCOUNTER — Encounter: Payer: Self-pay | Admitting: Nurse Practitioner

## 2019-10-03 ENCOUNTER — Inpatient Hospital Stay: Payer: PPO | Attending: Oncology | Admitting: Nurse Practitioner

## 2019-10-03 VITALS — BP 143/83 | HR 65 | Temp 97.8°F | Resp 20 | Wt 197.6 lb

## 2019-10-03 DIAGNOSIS — D696 Thrombocytopenia, unspecified: Secondary | ICD-10-CM | POA: Insufficient documentation

## 2019-10-03 DIAGNOSIS — Z79899 Other long term (current) drug therapy: Secondary | ICD-10-CM | POA: Diagnosis not present

## 2019-10-03 DIAGNOSIS — D509 Iron deficiency anemia, unspecified: Secondary | ICD-10-CM | POA: Insufficient documentation

## 2019-10-03 DIAGNOSIS — Z86718 Personal history of other venous thrombosis and embolism: Secondary | ICD-10-CM | POA: Diagnosis not present

## 2019-10-03 LAB — CBC WITH DIFFERENTIAL/PLATELET
Abs Immature Granulocytes: 0.01 10*3/uL (ref 0.00–0.07)
Basophils Absolute: 0 10*3/uL (ref 0.0–0.1)
Basophils Relative: 0 %
Eosinophils Absolute: 0.1 10*3/uL (ref 0.0–0.5)
Eosinophils Relative: 2 %
HCT: 44.2 % (ref 39.0–52.0)
Hemoglobin: 15.1 g/dL (ref 13.0–17.0)
Immature Granulocytes: 0 %
Lymphocytes Relative: 22 %
Lymphs Abs: 1.1 10*3/uL (ref 0.7–4.0)
MCH: 34.1 pg — ABNORMAL HIGH (ref 26.0–34.0)
MCHC: 34.2 g/dL (ref 30.0–36.0)
MCV: 99.8 fL (ref 80.0–100.0)
Monocytes Absolute: 0.5 10*3/uL (ref 0.1–1.0)
Monocytes Relative: 10 %
Neutro Abs: 3.3 10*3/uL (ref 1.7–7.7)
Neutrophils Relative %: 66 %
Platelets: 129 10*3/uL — ABNORMAL LOW (ref 150–400)
RBC: 4.43 MIL/uL (ref 4.22–5.81)
RDW: 12.7 % (ref 11.5–15.5)
WBC: 5 10*3/uL (ref 4.0–10.5)
nRBC: 0 % (ref 0.0–0.2)

## 2019-10-03 LAB — IRON AND TIBC
Iron: 85 ug/dL (ref 45–182)
Saturation Ratios: 20 % (ref 17.9–39.5)
TIBC: 427 ug/dL (ref 250–450)
UIBC: 342 ug/dL

## 2019-10-03 LAB — VITAMIN B12: Vitamin B-12: 204 pg/mL (ref 180–914)

## 2019-10-03 LAB — FERRITIN: Ferritin: 20 ng/mL — ABNORMAL LOW (ref 24–336)

## 2019-10-03 NOTE — Progress Notes (Addendum)
[place]  [phone] [phone]  ID: [place] PECHACEK OB: [birth year]  MR#: [id number]  NLG#:[id number]  Patient Care Team: Idelle Crouch, MD as PCP - General (Internal Medicine)  CHIEF COMPLAINT: Iron deficiency anemia.  INTERVAL HISTORY: Patient returns today for repeat lab work, further evaluation, and consideration of additional IV Feraheme.  Weakness and fatigue has improved significantly over the past several months and he continues to feel well.  In the interim, he has had EGD colonoscopy and EUS.  No restless leg or ice pica.  Did not melena or hematochezia.  He has no neurologic complaints.  Denies any recent fevers or illness.  He has a good appetite and denies weight loss.  He denies any chest pain, shortness of breath, cough, or hemoptysis.  He denies any nausea, vomiting, constipation, diarrhea.  He has no urinary complaints.  Patient offers no further specific complaints today.   REVIEW OF SYSTEMS:   Review of Systems  Constitutional: Negative for fever, malaise/fatigue and weight loss.  Respiratory: Negative.  Negative for cough, hemoptysis and shortness of breath.   Cardiovascular: Negative.  Negative for chest pain and leg swelling.  Gastrointestinal: Negative.  Negative for abdominal pain, blood in stool and melena.  Genitourinary: Negative.  Negative for hematuria.  Musculoskeletal: Negative.  Negative for back pain.  Skin: Negative.  Negative for rash.  Neurological: Negative for focal weakness, weakness and headaches.  Psychiatric/Behavioral: Negative.  The patient is not nervous/anxious.   As per HPI. Otherwise, a complete review of systems is negative.  PAST MEDICAL HISTORY: Past Medical History:  Diagnosis Date   Anemia    Chronic diarrhea    DDD (degenerative disc disease), cervical    Hyperlipidemia    Hypertension    borderline   IDA (iron deficiency anemia)    Low testosterone    Vasovagal syncope      PAST SURGICAL HISTORY: Past Surgical History:  Procedure Laterality Date   CARDIAC CATHETERIZATION     catracts Bilateral    CHOLECYSTECTOMY     COLONOSCOPY WITH PROPOFOL N/A 06/26/2016   Procedure: COLONOSCOPY WITH PROPOFOL;  Surgeon: Manya Silvas, MD;  Location: [facility] ENDOSCOPY;  Service: Endoscopy;  Laterality: N/A;   COLONOSCOPY WITH PROPOFOL N/A 07/03/2019   Procedure: COLONOSCOPY WITH PROPOFOL;  Surgeon: [name], MD;  Location: [facility] ENDOSCOPY;  Service: Gastroenterology;  Laterality: N/A;   ENDOSCOPIC RETROGRADE CHOLANGIOPANCREATOGRAPHY (ERCP) WITH PROPOFOL     ESOPHAGOGASTRODUODENOSCOPY N/A 04/19/2019   Procedure: ESOPHAGOGASTRODUODENOSCOPY (EGD);  Surgeon: [name], MD;  Location: [facility] ENDOSCOPY;  Service: Gastroenterology;  Laterality: N/A;   ESOPHAGOGASTRODUODENOSCOPY (EGD) WITH PROPOFOL N/A 07/03/2019   Procedure: ESOPHAGOGASTRODUODENOSCOPY (EGD) WITH PROPOFOL;  Surgeon: [name], MD;  Location: [facility] ENDOSCOPY;  Service: Gastroenterology;  Laterality: N/A;   ESOPHAGOGASTRODUODENOSCOPY (EGD) WITH PROPOFOL N/A 08/07/2019   Procedure: ESOPHAGOGASTRODUODENOSCOPY (EGD) WITH PROPOFOL;  Surgeon: Milus Banister, MD;  Location: [facility] ENDOSCOPY;  Service: Endoscopy;  Laterality: N/A;   EUS N/A 08/07/2019   Procedure: UPPER ENDOSCOPIC ULTRASOUND (EUS) RADIAL;  Surgeon: Milus Banister, MD;  Location: [facility] ENDOSCOPY;  Service: Endoscopy;  Laterality: N/A;   eyelid surgery     FOOT SURGERY     HEMORROIDECTOMY     HERNIA REPAIR     mrercp     RHINOPLASTY     TONSILLECTOMY     WISDOM TOOTH EXTRACTION      FAMILY HISTORY: Family History  Problem Relation Age of Onset   Hypothyroidism Mother    Diabetes  Mother    Lung cancer Mother     ADVANCED DIRECTIVES (Y/N):  N  HEALTH MAINTENANCE: Social History   Tobacco Use   Smoking status: Former Smoker    Packs/day: 1.00    Years: 25.00    Pack years: 25.00    Types: Cigarettes    Quit date:  12/05/1986    Years since quitting: 32.8   Smokeless tobacco: Never Used  Vaping Use   Vaping Use: Never used  Substance Use Topics   Alcohol use: Yes    Alcohol/week: 14.0 standard drinks    Types: 14 Cans of beer per week    Comment: 2 beers per day/ a couple glasses of wine    Drug use: No    Colonoscopy:  Bone density:  Lipid panel:  Allergies  Allergen Reactions   Nsaids     Can take Ibuprofen in moderation/high doses or prolonged use causes GI bleed    Current Outpatient Medications  Medication Sig Dispense Refill   acetaminophen (TYLENOL) 325 MG tablet Take 650 mg by mouth every 6 (six) hours as needed (pain.).      atorvastatin (LIPITOR) 40 MG tablet Take 40 mg by mouth daily.     cetirizine (ZYRTEC) 10 MG tablet Take 10 mg by mouth daily.     docusate sodium (COLACE) 100 MG capsule Take 1 capsule (100 mg total) by mouth 2 (two) times daily as needed for mild constipation. 15 capsule 0   Multiple Vitamin (MULTIVITAMIN WITH MINERALS) TABS tablet Take 1 tablet by mouth daily.     pantoprazole (PROTONIX) 40 MG tablet Take 1 tablet (40 mg total) by mouth daily. 90 tablet 3   timolol (TIMOPTIC) 0.5 % ophthalmic solution Place 1 drop into both eyes daily.      vitamin C (ASCORBIC ACID) 500 MG tablet Take 500 mg by mouth 2 (two) times daily.      tamsulosin (FLOMAX) 0.4 MG CAPS capsule Take 1 capsule (0.4 mg total) by mouth daily after breakfast. 90 capsule 3   vitamin B-12 (CYANOCOBALAMIN) 1000 MCG tablet Take 1,000 mcg by mouth daily.     No current facility-administered medications for this visit.    OBJECTIVE: Vitals:   10/03/19 1307  BP: (!) 143/83  Pulse: 65  Resp: 20  Temp: 97.8 F (36.6 C)  SpO2: 95%     Body mass index is 31.89 kg/m.     ECOG FS:0 - Asymptomatic  General: Well-developed, well-nourished, no acute distress. Eyes: Pink conjunctiva, anicteric sclera. Lungs: No audible wheezing or coughing Heart: Regular rate and rhythm.  Systolic murmur.  Abdomen: Soft, nontender, nondistended.  Musculoskeletal: No edema, cyanosis, or clubbing. Neuro: Alert, answering all questions appropriately. Cranial nerves grossly intact. Skin: No rashes or petechiae noted. Psych: Normal affect.   LAB RESULTS:  Lab Results  Component Value Date   NA 138 04/21/2019   K 3.4 (L) 04/21/2019   CL 106 04/21/2019   CO2 26 04/21/2019   GLUCOSE 112 (H) 04/21/2019   BUN 16 04/21/2019   CREATININE 0.69 04/21/2019   CALCIUM 8.0 (L) 04/21/2019   GFRNONAA >60 04/21/2019   GFRAA >60 04/21/2019    Lab Results  Component Value Date   WBC 5.0 10/03/2019   NEUTROABS 3.3 10/03/2019   HGB 15.1 10/03/2019   HCT 44.2 10/03/2019   MCV 99.8 10/03/2019   PLT 129 (L) 10/03/2019   Lab Results  Component Value Date   IRON 108 06/24/2019   TIBC 302 06/24/2019  IRONPCTSAT 36 06/24/2019   Lab Results  Component Value Date   FERRITIN 177 06/24/2019    STUDIES: No results found.  ASSESSMENT: Iron deficiency anemia  1. Iron deficiency anemia: Underwent endoscopy and colonoscopy on 07/03/2019.  LA 2 cm submucosal lesion seen at 9 o'clock position within the hiatal hernia with normal overlying mucosa.  Nonbleeding duodenal diverticulum and a large hiatal hernia.  He underwent EGD & EUS on 08/07/2019.  Ferritin and iron stores have previously normalized.  Pending today.  Last Shirlean Kelly was May 01, 2019.  Hemoglobin today is normalized at 15.1.  Hold Feraheme today given pending iron studies.  Return to clinic in 3 months with repeat lab work, further evaluation, and consideration of additional treatment.  2.  B12 deficiency-B12 level today pending.  Not currently on B12.  If low can consider restarting oral treatment.  3.  Thrombocytopenia-platelet count 129,000 today.  Etiology unclear.  Will follow up with [name].  4.  History of DVT-history of DVT previously on Eliquis.  Stopped due to bleed.  Not currently on anticoagulation per  vascular per patient.   Patient expressed understanding and was in agreement with this plan. He also understands that He can call clinic at any time with any questions, concerns, or complaints.   Verlon Au, NP   10/03/2019 3:18 PM  10/07/2019- 11:17 am- spoke to Eulogio Ditch, NP Vascular. Drew for patient to be off anticoagulation/eliquis from their perspective.

## 2019-10-21 DIAGNOSIS — M9903 Segmental and somatic dysfunction of lumbar region: Secondary | ICD-10-CM | POA: Diagnosis not present

## 2019-10-21 DIAGNOSIS — M9905 Segmental and somatic dysfunction of pelvic region: Secondary | ICD-10-CM | POA: Diagnosis not present

## 2019-10-21 DIAGNOSIS — M955 Acquired deformity of pelvis: Secondary | ICD-10-CM | POA: Diagnosis not present

## 2019-10-21 DIAGNOSIS — M5432 Sciatica, left side: Secondary | ICD-10-CM | POA: Diagnosis not present

## 2019-11-07 ENCOUNTER — Other Ambulatory Visit: Payer: Self-pay

## 2019-11-07 ENCOUNTER — Ambulatory Visit (INDEPENDENT_AMBULATORY_CARE_PROVIDER_SITE_OTHER): Payer: PPO | Admitting: Urology

## 2019-11-07 ENCOUNTER — Encounter: Payer: Self-pay | Admitting: Urology

## 2019-11-07 VITALS — BP 128/72 | HR 60 | Ht 66.0 in | Wt 186.0 lb

## 2019-11-07 DIAGNOSIS — N401 Enlarged prostate with lower urinary tract symptoms: Secondary | ICD-10-CM | POA: Diagnosis not present

## 2019-11-07 LAB — BLADDER SCAN AMB NON-IMAGING: Scan Result: 25

## 2019-11-07 MED ORDER — TAMSULOSIN HCL 0.4 MG PO CAPS: 0.4000 mg | ORAL_CAPSULE | Freq: Every day | ORAL | 0 refills | Status: DC

## 2019-11-07 NOTE — Progress Notes (Signed)
11/07/2019 10:25 AM   Marcus Coleman 01-24-1941 [id number]  Referring provider: Idelle Crouch, MD [facility] [place],  [place] [address]  Chief Complaint  Patient presents with   Benign Prostatic Hypertrophy    HPI: 79 y.o. male presents for annual follow-up BPH.   Stable LUTS  Notes urgency in a.m. and occasional daytime urgency  Hospitalized February 2021 with hemoglobin 5 and suspected GI bleed  Stopped tamsulosin and other medications but desires to restart  Was taking prn  Denies dysuria, gross hematuria  PCP checking PSA; 0.17 05/2019  IPSS 10/35     PMH: Past Medical History:  Diagnosis Date   Anemia    Chronic diarrhea    DDD (degenerative disc disease), cervical    Hyperlipidemia    Hypertension    borderline   IDA (iron deficiency anemia)    Low testosterone    Vasovagal syncope     Surgical History: Past Surgical History:  Procedure Laterality Date   CARDIAC CATHETERIZATION     catracts Bilateral    CHOLECYSTECTOMY     COLONOSCOPY WITH PROPOFOL N/A 06/26/2016   Procedure: COLONOSCOPY WITH PROPOFOL;  Surgeon: Manya Silvas, MD;  Location: [facility];  Service: Endoscopy;  Laterality: N/A;   COLONOSCOPY WITH PROPOFOL N/A 07/03/2019   Procedure: COLONOSCOPY WITH PROPOFOL;  Surgeon: [name], MD;  Location: [facility] ENDOSCOPY;  Service: Gastroenterology;  Laterality: N/A;   ENDOSCOPIC RETROGRADE CHOLANGIOPANCREATOGRAPHY (ERCP) WITH PROPOFOL     ESOPHAGOGASTRODUODENOSCOPY N/A 04/19/2019   Procedure: ESOPHAGOGASTRODUODENOSCOPY (EGD);  Surgeon: [name], MD;  Location: [facility] ENDOSCOPY;  Service: Gastroenterology;  Laterality: N/A;   ESOPHAGOGASTRODUODENOSCOPY (EGD) WITH PROPOFOL N/A 07/03/2019   Procedure: ESOPHAGOGASTRODUODENOSCOPY (EGD) WITH PROPOFOL;  Surgeon: [name], MD;  Location: [facility] ENDOSCOPY;  Service: Gastroenterology;  Laterality: N/A;   ESOPHAGOGASTRODUODENOSCOPY (EGD) WITH  PROPOFOL N/A 08/07/2019   Procedure: ESOPHAGOGASTRODUODENOSCOPY (EGD) WITH PROPOFOL;  Surgeon: Milus Banister, MD;  Location: [facility] ENDOSCOPY;  Service: Endoscopy;  Laterality: N/A;   EUS N/A 08/07/2019   Procedure: UPPER ENDOSCOPIC ULTRASOUND (EUS) RADIAL;  Surgeon: Milus Banister, MD;  Location: [facility] ENDOSCOPY;  Service: Endoscopy;  Laterality: N/A;   eyelid surgery     FOOT SURGERY     HEMORROIDECTOMY     HERNIA REPAIR     mrercp     RHINOPLASTY     TONSILLECTOMY     WISDOM TOOTH EXTRACTION      Home Medications:  Allergies as of 11/07/2019      Reactions   Nsaids    Can take Ibuprofen in moderation/high doses or prolonged use causes GI bleed      Medication List       Accurate as of November 07, 2019 10:25 AM. If you have any questions, ask your nurse or doctor.        STOP taking these medications   pantoprazole 40 MG tablet Commonly known as: PROTONIX Stopped by: Abbie Sons, MD     TAKE these medications   acetaminophen 325 MG tablet Commonly known as: TYLENOL Take 650 mg by mouth every 6 (six) hours as needed (pain.).   atorvastatin 40 MG tablet Commonly known as: LIPITOR Take 40 mg by mouth daily.   cetirizine 10 MG tablet Commonly known as: ZYRTEC Take 10 mg by mouth daily.   docusate sodium 100 MG capsule Commonly known as: COLACE Take 1 capsule (100 mg total) by mouth 2 (two) times daily as needed for mild constipation.  multivitamin with minerals Tabs tablet Take 1 tablet by mouth daily.   tamsulosin 0.4 MG Caps capsule Commonly known as: FLOMAX Take 1 capsule (0.4 mg total) by mouth daily after breakfast.   timolol 0.5 % ophthalmic solution Commonly known as: TIMOPTIC Place 1 drop into both eyes daily.   vitamin B-12 1000 MCG tablet Commonly known as: CYANOCOBALAMIN Take 1,000 mcg by mouth daily.   vitamin C 500 MG tablet Commonly known as: ASCORBIC ACID Take 500 mg by mouth 2 (two) times daily.       Allergies:    Allergies  Allergen Reactions   Nsaids     Can take Ibuprofen in moderation/high doses or prolonged use causes GI bleed    Family History: Family History  Problem Relation Age of Onset   Hypothyroidism Mother    Diabetes Mother    Lung cancer Mother     Social History:  reports that he quit smoking about 32 years ago. His smoking use included cigarettes. He has a 25.00 pack-year smoking history. He has never used smokeless tobacco. He reports current alcohol use of about 14.0 standard drinks of alcohol per week. He reports that he does not use drugs.   Physical Exam: BP 128/72    Pulse 60    Ht 5\' 6"  (1.676 m)    Wt 186 lb (84.4 kg)    BMI 30.02 kg/m   Constitutional:  Alert and oriented, No acute distress. HEENT: [place] AT, moist mucus membranes.  Trachea midline, no masses. Cardiovascular: No clubbing, cyanosis, or edema. Respiratory: Normal respiratory effort, no increased work of breathing. Neurologic: Grossly intact, no focal deficits, moving all 4 extremities. Psychiatric: Normal mood and affect.    Assessment & Plan:    1. Benign prostatic hyperplasia with lower urinary tract symptoms, symptom details unspecified  Mild LUTS  Desires to restart tamsulosin and take prn, Rx sent  Bladder scan PVR 25 mL  Continue annual follow-up   Abbie Sons, MD  [place] [address] [place], [place] [address] [phone]

## 2019-11-18 DIAGNOSIS — H40003 Preglaucoma, unspecified, bilateral: Secondary | ICD-10-CM | POA: Diagnosis not present

## 2019-11-18 DIAGNOSIS — M955 Acquired deformity of pelvis: Secondary | ICD-10-CM | POA: Diagnosis not present

## 2019-11-18 DIAGNOSIS — M9905 Segmental and somatic dysfunction of pelvic region: Secondary | ICD-10-CM | POA: Diagnosis not present

## 2019-11-18 DIAGNOSIS — M5432 Sciatica, left side: Secondary | ICD-10-CM | POA: Diagnosis not present

## 2019-11-18 DIAGNOSIS — M9903 Segmental and somatic dysfunction of lumbar region: Secondary | ICD-10-CM | POA: Diagnosis not present

## 2019-11-25 ENCOUNTER — Other Ambulatory Visit: Payer: Self-pay

## 2019-11-25 ENCOUNTER — Ambulatory Visit (INDEPENDENT_AMBULATORY_CARE_PROVIDER_SITE_OTHER): Payer: PPO | Admitting: Vascular Surgery

## 2019-11-25 ENCOUNTER — Encounter (INDEPENDENT_AMBULATORY_CARE_PROVIDER_SITE_OTHER): Payer: Self-pay | Admitting: Vascular Surgery

## 2019-11-25 VITALS — BP 149/75 | HR 46 | Ht 66.0 in | Wt 194.0 lb

## 2019-11-25 DIAGNOSIS — M7989 Other specified soft tissue disorders: Secondary | ICD-10-CM | POA: Insufficient documentation

## 2019-11-25 DIAGNOSIS — R600 Localized edema: Secondary | ICD-10-CM

## 2019-11-25 DIAGNOSIS — I82431 Acute embolism and thrombosis of right popliteal vein: Secondary | ICD-10-CM

## 2019-11-25 DIAGNOSIS — R030 Elevated blood-pressure reading, without diagnosis of hypertension: Secondary | ICD-10-CM

## 2019-11-25 DIAGNOSIS — H401111 Primary open-angle glaucoma, right eye, mild stage: Secondary | ICD-10-CM | POA: Diagnosis not present

## 2019-11-25 DIAGNOSIS — M5416 Radiculopathy, lumbar region: Secondary | ICD-10-CM | POA: Diagnosis not present

## 2019-11-25 NOTE — Assessment & Plan Note (Signed)
Fairly mild at this point. Wears compression stockings when swelling worsens. Elevates as well. No further therapy needed at this time other than his current conservative measures. Recheck in 1 year.

## 2019-11-25 NOTE — Progress Notes (Signed)
MRN : [id number]  Marcus Coleman is a 79 y.o. ([birth year]) male who presents with chief complaint of  Chief Complaint  Patient presents with  . Follow-up    6 mo no studies  .  History of Present Illness: Patient returns today in follow up of his previous right leg DVT and leg swelling. Overall he is doing well. He had a short course of anticoagulation due to a GI bleed, but follow-up duplex showed resolution of the DVT and no further therapy was necessary. He has had no worsening symptoms. He has occasional mild swelling more on the right than the left. When he does, he elevates his legs more and puts on compression stockings and that seems to take care of it. No new complaints today  Current Outpatient Medications  Medication Sig Dispense Refill  . cetirizine (ZYRTEC) 10 MG tablet Take 10 mg by mouth daily.    . timolol (TIMOPTIC) 0.5 % ophthalmic solution Place 1 drop into both eyes daily.     Marland Kitchen acetaminophen (TYLENOL) 325 MG tablet Take 650 mg by mouth every 6 (six) hours as needed (pain.).  (Patient not taking: Reported on 11/25/2019)    . atorvastatin (LIPITOR) 40 MG tablet Take 40 mg by mouth daily. (Patient not taking: Reported on 11/25/2019)    . docusate sodium (COLACE) 100 MG capsule Take 1 capsule (100 mg total) by mouth 2 (two) times daily as needed for mild constipation. (Patient not taking: Reported on 11/25/2019) 15 capsule 0  . Multiple Vitamin (MULTIVITAMIN WITH MINERALS) TABS tablet Take 1 tablet by mouth daily. (Patient not taking: Reported on 11/25/2019)    . tamsulosin (FLOMAX) 0.4 MG CAPS capsule Take 1 capsule (0.4 mg total) by mouth daily after breakfast. (Patient not taking: Reported on 11/25/2019) 90 capsule 0  . vitamin B-12 (CYANOCOBALAMIN) 1000 MCG tablet Take 1,000 mcg by mouth daily.    . vitamin C (ASCORBIC ACID) 500 MG tablet Take 500 mg by mouth 2 (two) times daily.      No current facility-administered medications for this visit.    Past Medical History:   Diagnosis Date  . Anemia   . Chronic diarrhea   . DDD (degenerative disc disease), cervical   . Hyperlipidemia   . Hypertension    borderline  . IDA (iron deficiency anemia)   . Low testosterone   . Vasovagal syncope     Past Surgical History:  Procedure Laterality Date  . CARDIAC CATHETERIZATION    . catracts Bilateral   . CHOLECYSTECTOMY    . COLONOSCOPY WITH PROPOFOL N/A 06/26/2016   Procedure: COLONOSCOPY WITH PROPOFOL;  Surgeon: Manya Silvas, MD;  Location: [facility] ENDOSCOPY;  Service: Endoscopy;  Laterality: N/A;  . COLONOSCOPY WITH PROPOFOL N/A 07/03/2019   Procedure: COLONOSCOPY WITH PROPOFOL;  Surgeon: [name], MD;  Location: [facility] ENDOSCOPY;  Service: Gastroenterology;  Laterality: N/A;  . ENDOSCOPIC RETROGRADE CHOLANGIOPANCREATOGRAPHY (ERCP) WITH PROPOFOL    . ESOPHAGOGASTRODUODENOSCOPY N/A 04/19/2019   Procedure: ESOPHAGOGASTRODUODENOSCOPY (EGD);  Surgeon: [name], MD;  Location: [facility] ENDOSCOPY;  Service: Gastroenterology;  Laterality: N/A;  . ESOPHAGOGASTRODUODENOSCOPY (EGD) WITH PROPOFOL N/A 07/03/2019   Procedure: ESOPHAGOGASTRODUODENOSCOPY (EGD) WITH PROPOFOL;  Surgeon: [name], MD;  Location: [facility] ENDOSCOPY;  Service: Gastroenterology;  Laterality: N/A;  . ESOPHAGOGASTRODUODENOSCOPY (EGD) WITH PROPOFOL N/A 08/07/2019   Procedure: ESOPHAGOGASTRODUODENOSCOPY (EGD) WITH PROPOFOL;  Surgeon: Milus Banister, MD;  [facility] ENDOSCOPY;  Service: Endoscopy;  Laterality: N/A;  . EUS N/A 08/07/2019   Procedure: UPPER ENDOSCOPIC  ULTRASOUND (EUS) RADIAL;  Surgeon: Milus Banister, MD;  [facility] ENDOSCOPY;  Service: Endoscopy;  Laterality: N/A;  . eyelid surgery    . FOOT SURGERY    . HEMORROIDECTOMY    . HERNIA REPAIR    . mrercp    . RHINOPLASTY    . TONSILLECTOMY    . WISDOM TOOTH EXTRACTION       Social History   Tobacco Use  . Smoking status: Former Smoker    Packs/day: 1.00    Years: 25.00    Pack years: 25.00    Types: Cigarettes    Quit  date: 12/05/1986    Years since quitting: 32.9  . Smokeless tobacco: Never Used  Vaping Use  . Vaping Use: Never used  Substance Use Topics  . Alcohol use: Yes    Alcohol/week: 14.0 standard drinks    Types: 14 Cans of beer per week    Comment: 2 beers per day/ a couple glasses of wine   . Drug use: No     Family History  Problem Relation Age of Onset  . Hypothyroidism Mother   . Diabetes Mother   . Lung cancer Mother   no bleeding or clotting disorders  Allergies  Allergen Reactions  . Nsaids     Can take Ibuprofen in moderation/high doses or prolonged use causes GI bleed    REVIEW OF SYSTEMS (Negative unless checked)  Constitutional: [] ?Weight loss  [] ?Fever  [] ?Chills Cardiac: [] ?Chest pain   [] ?Chest pressure   [] ?Palpitations   [] ?Shortness of breath when laying flat   [] ?Shortness of breath at rest   [] ?Shortness of breath with exertion. Vascular:  [x] ?Pain in legs with walking   [x] ?Pain in legs at rest   [] ?Pain in legs when laying flat   [] ?Claudication   [] ?Pain in feet when walking  [] ?Pain in feet at rest  [] ?Pain in feet when laying flat   [x] ?History of DVT   [] ?Phlebitis   [x] ?Swelling in legs   [] ?Varicose veins   [] ?Non-healing ulcers Pulmonary:   [] ?Uses home oxygen   [] ?Productive cough   [] ?Hemoptysis   [] ?Wheeze  [] ?COPD   [] ?Asthma Neurologic:  [] ?Dizziness  [] ?Blackouts   [] ?Seizures   [] ?History of stroke   [] ?History of TIA  [] ?Aphasia   [] ?Temporary blindness   [] ?Dysphagia   [] ?Weakness or numbness in arms   [] ?Weakness or numbness in legs Musculoskeletal:  [x] ?Arthritis   [] ?Joint swelling   [] ?Joint pain   [] ?Low back pain Hematologic:  [] ?Easy bruising  [] ?Easy bleeding   [] ?Hypercoagulable state   [] ?Anemic  [] ?Hepatitis Gastrointestinal:  [] ?Blood in stool   [] ?Vomiting blood  [] ?Gastroesophageal reflux/heartburn   [] ?Abdominal pain Genitourinary:  [] ?Chronic kidney disease   [] ?Difficult urination  [] ?Frequent urination  [] ?Burning with urination    [] ?Hematuria Skin:  [] ?Rashes   [] ?Ulcers   [] ?Wounds Psychological:  [] ?History of anxiety   [] ? History of major depression.  Physical Examination  BP (!) 149/75   Pulse (!) 46   Ht 5\' 6"  (1.676 m)   Wt 194 lb (88 kg)   BMI 31.31 kg/m  Gen:  WD/WN, NAD. Appears younger than stated age. Head: [place]/AT, No temporalis wasting. Ear/Nose/Throat: Hearing grossly intact, nares w/o erythema or drainage Eyes: Conjunctiva clear. Sclera non-icteric Neck: Supple.  Trachea midline Pulmonary:  Good air movement, no use of accessory muscles.  Cardiac: RRR, no JVD Vascular:  Vessel Right Left  Radial Palpable Palpable  Musculoskeletal: M/S 5/5 throughout.  No deformity or atrophy. Trace RLE edema. Neurologic: Sensation grossly intact in extremities.  Symmetrical.  Speech is fluent.  Psychiatric: Judgment intact, Mood & affect appropriate for pt's clinical situation. Dermatologic: No rashes or ulcers noted.  No cellulitis or open wounds.       Labs Recent Results (from the past 2160 hour(s))  Vitamin B12     Status: None   Collection Time: 10/03/19 12:44 PM  Result Value Ref Range   Vitamin B-12 204 180 - 914 pg/mL    Comment: (NOTE) This assay is not validated for testing neonatal or myeloproliferative syndrome specimens for Vitamin B12 levels. Performed at [facility], [place] [address] [place], [place] [address]   Iron and TIBC     Status: None   Collection Time: 10/03/19 12:44 PM  Result Value Ref Range   Iron 85 45 - 182 ug/dL   TIBC 427 250 - 450 ug/dL   Saturation Ratios 20 17.9 - 39.5 %   UIBC 342 ug/dL    Comment: Performed at [facility], [address] [place], [place] [address]  Ferritin     Status: Abnormal   Collection Time: 10/03/19 12:44 PM  Result Value Ref Range   Ferritin 20 (L) 24 - 336 ng/mL    Comment: Performed at [facility], [address] [place], [place] [address]  CBC with Differential/Platelet      Status: Abnormal   Collection Time: 10/03/19 12:44 PM  Result Value Ref Range   WBC 5.0 4.0 - 10.5 K/uL   RBC 4.43 4.22 - 5.81 MIL/uL   Hemoglobin 15.1 13.0 - 17.0 g/dL   HCT 44.2 39 - 52 %   MCV 99.8 80.0 - 100.0 fL   MCH 34.1 (H) 26.0 - 34.0 pg   MCHC 34.2 30.0 - 36.0 g/dL   RDW 12.7 11.5 - 15.5 %   Platelets 129 (L) 150 - 400 K/uL   nRBC 0.0 0.0 - 0.2 %   Neutrophils Relative % 66 %   Neutro Abs 3.3 1.7 - 7.7 K/uL   Lymphocytes Relative 22 %   Lymphs Abs 1.1 0.7 - 4.0 K/uL   Monocytes Relative 10 %   Monocytes Absolute 0.5 0 - 1 K/uL   Eosinophils Relative 2 %   Eosinophils Absolute 0.1 0 - 0 K/uL   Basophils Relative 0 %   Basophils Absolute 0.0 0 - 0 K/uL   Immature Granulocytes 0 %   Abs Immature Granulocytes 0.01 0.00 - 0.07 K/uL    Comment: Performed at [facility], [address] [place], [place] [address]  Bladder Scan (Post Void Residual) in office     Status: None   Collection Time: 11/07/19 10:20 AM  Result Value Ref Range   Scan Result 25     Radiology No results found.  Assessment/Plan Lumbar radiculitis Could be contributing to some of his lower extremity symptoms.  Borderline hypertension Not currently on meds. blood pressure control important in reducing the progression of atherosclerotic disease.   Acute deep vein thrombosis (DVT) of popliteal vein (HCC) Had a relatively short course of anticoagulation due to a GI bleed, but a follow-up duplex showed resolution and we did not resume the anticoagulation. That has been almost 6 months ago. No recurrent symptoms and overall doing well.  Swelling of limb Fairly mild at this point. Wears compression stockings when swelling worsens. Elevates as well. No further therapy needed at this time other  than his current conservative measures. Recheck in 1 year.    Leotis Pain, MD  11/25/2019 11:02 AM    This note was created with Dragon medical transcription system.  Any errors from dictation are  purely unintentional

## 2019-11-25 NOTE — Assessment & Plan Note (Signed)
Had a relatively short course of anticoagulation due to a GI bleed, but a follow-up duplex showed resolution and we did not resume the anticoagulation. That has been almost 6 months ago. No recurrent symptoms and overall doing well.

## 2019-12-02 DIAGNOSIS — Z79899 Other long term (current) drug therapy: Secondary | ICD-10-CM | POA: Diagnosis not present

## 2019-12-02 DIAGNOSIS — E7800 Pure hypercholesterolemia, unspecified: Secondary | ICD-10-CM | POA: Diagnosis not present

## 2019-12-09 DIAGNOSIS — Z79899 Other long term (current) drug therapy: Secondary | ICD-10-CM | POA: Diagnosis not present

## 2019-12-09 DIAGNOSIS — D509 Iron deficiency anemia, unspecified: Secondary | ICD-10-CM | POA: Diagnosis not present

## 2019-12-09 DIAGNOSIS — L989 Disorder of the skin and subcutaneous tissue, unspecified: Secondary | ICD-10-CM | POA: Diagnosis not present

## 2019-12-09 DIAGNOSIS — Z23 Encounter for immunization: Secondary | ICD-10-CM | POA: Diagnosis not present

## 2019-12-09 DIAGNOSIS — E7800 Pure hypercholesterolemia, unspecified: Secondary | ICD-10-CM | POA: Diagnosis not present

## 2019-12-09 DIAGNOSIS — R030 Elevated blood-pressure reading, without diagnosis of hypertension: Secondary | ICD-10-CM | POA: Diagnosis not present

## 2019-12-09 DIAGNOSIS — Z125 Encounter for screening for malignant neoplasm of prostate: Secondary | ICD-10-CM | POA: Diagnosis not present

## 2019-12-10 DIAGNOSIS — E7800 Pure hypercholesterolemia, unspecified: Secondary | ICD-10-CM | POA: Diagnosis not present

## 2019-12-10 DIAGNOSIS — I340 Nonrheumatic mitral (valve) insufficiency: Secondary | ICD-10-CM | POA: Diagnosis not present

## 2019-12-10 DIAGNOSIS — Z86718 Personal history of other venous thrombosis and embolism: Secondary | ICD-10-CM | POA: Diagnosis not present

## 2019-12-16 DIAGNOSIS — M9903 Segmental and somatic dysfunction of lumbar region: Secondary | ICD-10-CM | POA: Diagnosis not present

## 2019-12-16 DIAGNOSIS — M5432 Sciatica, left side: Secondary | ICD-10-CM | POA: Diagnosis not present

## 2019-12-16 DIAGNOSIS — M955 Acquired deformity of pelvis: Secondary | ICD-10-CM | POA: Diagnosis not present

## 2019-12-16 DIAGNOSIS — M9905 Segmental and somatic dysfunction of pelvic region: Secondary | ICD-10-CM | POA: Diagnosis not present

## 2019-12-23 DIAGNOSIS — Z85828 Personal history of other malignant neoplasm of skin: Secondary | ICD-10-CM | POA: Diagnosis not present

## 2019-12-23 DIAGNOSIS — X32XXXA Exposure to sunlight, initial encounter: Secondary | ICD-10-CM | POA: Diagnosis not present

## 2019-12-23 DIAGNOSIS — L570 Actinic keratosis: Secondary | ICD-10-CM | POA: Diagnosis not present

## 2019-12-23 DIAGNOSIS — D225 Melanocytic nevi of trunk: Secondary | ICD-10-CM | POA: Diagnosis not present

## 2019-12-23 DIAGNOSIS — D485 Neoplasm of uncertain behavior of skin: Secondary | ICD-10-CM | POA: Diagnosis not present

## 2019-12-23 DIAGNOSIS — D2262 Melanocytic nevi of left upper limb, including shoulder: Secondary | ICD-10-CM | POA: Diagnosis not present

## 2019-12-23 DIAGNOSIS — D2261 Melanocytic nevi of right upper limb, including shoulder: Secondary | ICD-10-CM | POA: Diagnosis not present

## 2019-12-23 DIAGNOSIS — C44319 Basal cell carcinoma of skin of other parts of face: Secondary | ICD-10-CM | POA: Diagnosis not present

## 2019-12-23 DIAGNOSIS — D2271 Melanocytic nevi of right lower limb, including hip: Secondary | ICD-10-CM | POA: Diagnosis not present

## 2020-01-01 NOTE — Progress Notes (Signed)
[name]  [phone] [phone]) [phone]  ID: Marcus Coleman OB: December 02, 1940  MR#: [id number]  LKG#:[id number]  Patient Care Team: Idelle Crouch, MD as PCP - General (Internal Medicine)  CHIEF COMPLAINT: Iron deficiency anemia.  INTERVAL HISTORY: Patient returns to clinic today for further evaluation and consideration of additional IV iron.  He currently feels well and is asymptomatic.  He does not complain of any weakness or fatigue today. He has no neurologic complaints.  He denies any recent fevers or illnesses.  He has a good appetite and denies weight loss.  He denies any chest pain, shortness of breath, cough, or hemoptysis.  He denies any nausea, vomiting, constipation, or diarrhea.  He has no melena or hematochezia.  He has no urinary complaints.  Patient offers no specific complaints today.  REVIEW OF SYSTEMS:   Review of Systems  Constitutional: Negative.  Negative for fever, malaise/fatigue and weight loss.  Respiratory: Negative.  Negative for cough, hemoptysis and shortness of breath.   Cardiovascular: Negative.  Negative for chest pain and leg swelling.  Gastrointestinal: Negative.  Negative for abdominal pain, blood in stool and melena.  Genitourinary: Negative.  Negative for hematuria.  Musculoskeletal: Negative.  Negative for back pain.  Skin: Negative.  Negative for rash.  Neurological: Negative.  Negative for dizziness, focal weakness, weakness and headaches.  Psychiatric/Behavioral: Negative.  The patient is not nervous/anxious.     As per HPI. Otherwise, a complete review of systems is negative.  PAST MEDICAL HISTORY: Past Medical History:  Diagnosis Date  . Anemia   . Chronic diarrhea   . DDD (degenerative disc disease), cervical   . Hyperlipidemia   . Hypertension    borderline  . IDA (iron deficiency anemia)   . Low testosterone   . Vasovagal syncope     PAST SURGICAL HISTORY: Past Surgical History:  Procedure  Laterality Date  . CARDIAC CATHETERIZATION    . catracts Bilateral   . CHOLECYSTECTOMY    . COLONOSCOPY WITH PROPOFOL N/A 06/26/2016   Procedure: COLONOSCOPY WITH PROPOFOL;  Surgeon: Manya Silvas, MD;  [facility] ENDOSCOPY;  Service: Endoscopy;  Laterality: N/A;  . COLONOSCOPY WITH PROPOFOL N/A 07/03/2019   Procedure: COLONOSCOPY WITH PROPOFOL;  Surgeon: [name], MD;  Location[facility] ENDOSCOPY;  Service: Gastroenterology;  Laterality: N/A;  . ENDOSCOPIC RETROGRADE CHOLANGIOPANCREATOGRAPHY (ERCP) WITH PROPOFOL    . ESOPHAGOGASTRODUODENOSCOPY N/A 04/19/2019   Procedure: ESOPHAGOGASTRODUODENOSCOPY (EGD);  Surgeon: [name], MD;  Location: [facility] ENDOSCOPY;  Service: Gastroenterology;  Laterality: N/A;  . ESOPHAGOGASTRODUODENOSCOPY (EGD) WITH PROPOFOL N/A 07/03/2019   Procedure: ESOPHAGOGASTRODUODENOSCOPY (EGD) WITH PROPOFOL;  Surgeon: [name], MD;  Location: [facility] ENDOSCOPY;  Service: Gastroenterology;  Laterality: N/A;  . ESOPHAGOGASTRODUODENOSCOPY (EGD) WITH PROPOFOL N/A 08/07/2019   Procedure: ESOPHAGOGASTRODUODENOSCOPY (EGD) WITH PROPOFOL;  Surgeon: Milus Banister, MD;  [facility] ENDOSCOPY;  Service: Endoscopy;  Laterality: N/A;  . EUS N/A 08/07/2019   Procedure: UPPER ENDOSCOPIC ULTRASOUND (EUS) RADIAL;  Surgeon: Milus Banister, MD;  Location: [facility] ENDOSCOPY;  Service: Endoscopy;  Laterality: N/A;  . eyelid surgery    . FOOT SURGERY    . HEMORROIDECTOMY    . HERNIA REPAIR    . mrercp    . RHINOPLASTY    . TONSILLECTOMY    . WISDOM TOOTH EXTRACTION      FAMILY HISTORY: Family History  Problem Relation Age of Onset  . Hypothyroidism Mother   . Diabetes Mother   . Lung cancer Mother  ADVANCED DIRECTIVES (Y/N):  N  HEALTH MAINTENANCE: Social History   Tobacco Use  . Smoking status: Former Smoker    Packs/day: 1.00    Years: 25.00    Pack years: 25.00    Types: Cigarettes    Quit date: 12/05/1986    Years since quitting: 33.1  . Smokeless tobacco: Never  Used  Vaping Use  . Vaping Use: Never used  Substance Use Topics  . Alcohol use: Yes    Alcohol/week: 14.0 standard drinks    Types: 14 Cans of beer per week    Comment: 2 beers per day/ a couple glasses of wine   . Drug use: No     Colonoscopy:  PAP:  Bone density:  Lipid panel:  Allergies  Allergen Reactions  . Nsaids     Can take Ibuprofen in moderation/high doses or prolonged use causes GI bleed    Current Outpatient Medications  Medication Sig Dispense Refill  . acetaminophen (TYLENOL) 325 MG tablet Take 650 mg by mouth every 6 (six) hours as needed (pain.).     Marland Kitchen cetirizine (ZYRTEC) 10 MG tablet Take 10 mg by mouth daily.    . tamsulosin (FLOMAX) 0.4 MG CAPS capsule Take 1 capsule (0.4 mg total) by mouth daily after breakfast. 90 capsule 0  . timolol (TIMOPTIC) 0.5 % ophthalmic solution Place 1 drop into both eyes daily.     Marland Kitchen atorvastatin (LIPITOR) 40 MG tablet Take 40 mg by mouth daily. (Patient not taking: Reported on 01/05/2020)    . docusate sodium (COLACE) 100 MG capsule Take 1 capsule (100 mg total) by mouth 2 (two) times daily as needed for mild constipation. (Patient not taking: Reported on 01/05/2020) 15 capsule 0   No current facility-administered medications for this visit.    OBJECTIVE: Vitals:   01/05/20 1314  BP: (!) 143/86  Pulse: 66  Resp: 20  Temp: 98.1 F (36.7 C)  SpO2: 99%     Body mass index is 31.46 kg/m.    ECOG FS:0 - Asymptomatic  General: Well-developed, well-nourished, no acute distress. Eyes: Pink conjunctiva, anicteric sclera. HEENT: Normocephalic, moist mucous membranes. Lungs: No audible wheezing or coughing. Heart: Regular rate and rhythm. Abdomen: Soft, nontender, no obvious distention. Musculoskeletal: No edema, cyanosis, or clubbing. Neuro: Alert, answering all questions appropriately. Cranial nerves grossly intact. Skin: No rashes or petechiae noted. Psych: Normal affect.  LAB RESULTS:  Lab Results  Component Value  Date   NA 138 04/21/2019   K 3.4 (L) 04/21/2019   CL 106 04/21/2019   CO2 26 04/21/2019   GLUCOSE 112 (H) 04/21/2019   BUN 16 04/21/2019   CREATININE 0.69 04/21/2019   CALCIUM 8.0 (L) 04/21/2019   GFRNONAA >60 04/21/2019   GFRAA >60 04/21/2019    Lab Results  Component Value Date   WBC 4.6 01/05/2020   NEUTROABS 2.7 01/05/2020   HGB 14.1 01/05/2020   HCT 41.9 01/05/2020   MCV 101.9 (H) 01/05/2020   PLT 174 01/05/2020   Lab Results  Component Value Date   IRON 30 (L) 01/05/2020   TIBC 442 01/05/2020   IRONPCTSAT 7 (L) 01/05/2020   Lab Results  Component Value Date   FERRITIN 12 (L) 01/05/2020     STUDIES: No results found.  ASSESSMENT: Iron deficiency anemia  1. Iron deficiency anemia: Patient's hemoglobin has trended down slightly, but continues to be within normal limits.  He was noted to have decreased iron stores.  He had a normal colonoscopy on June 26, 2016.  His most recent EGD on Aug 07, 2019 did not reveal any significant pathology.  He does not require additional Feraheme today, but may require some in his next clinic visit.  He last received treatment on May 01, 2019.  Return to clinic in 4 months with repeat laboratory work, further evaluation, and continuation of treatment if needed.     2.  B12 deficiency: Resolved.  B12 levels continue to be within normal limits.  I spent a total of 20 minutes reviewing chart data, face-to-face evaluation with the patient, counseling and coordination of care as detailed above.     Patient expressed understanding and was in agreement with this plan. He also understands that He can call clinic at any time with any questions, concerns, or complaints.    Lloyd Huger, MD   01/05/2020 4:00 PM

## 2020-01-05 ENCOUNTER — Encounter: Payer: Self-pay | Admitting: Oncology

## 2020-01-05 ENCOUNTER — Inpatient Hospital Stay: Payer: PPO

## 2020-01-05 ENCOUNTER — Inpatient Hospital Stay (HOSPITAL_BASED_OUTPATIENT_CLINIC_OR_DEPARTMENT_OTHER): Payer: PPO | Admitting: Oncology

## 2020-01-05 ENCOUNTER — Other Ambulatory Visit: Payer: Self-pay

## 2020-01-05 ENCOUNTER — Inpatient Hospital Stay: Payer: PPO | Attending: Oncology

## 2020-01-05 VITALS — BP 143/86 | HR 66 | Temp 98.1°F | Resp 20 | Wt 194.9 lb

## 2020-01-05 DIAGNOSIS — Z79899 Other long term (current) drug therapy: Secondary | ICD-10-CM | POA: Insufficient documentation

## 2020-01-05 DIAGNOSIS — E785 Hyperlipidemia, unspecified: Secondary | ICD-10-CM | POA: Insufficient documentation

## 2020-01-05 DIAGNOSIS — Z833 Family history of diabetes mellitus: Secondary | ICD-10-CM | POA: Insufficient documentation

## 2020-01-05 DIAGNOSIS — I10 Essential (primary) hypertension: Secondary | ICD-10-CM | POA: Diagnosis not present

## 2020-01-05 DIAGNOSIS — D509 Iron deficiency anemia, unspecified: Secondary | ICD-10-CM | POA: Diagnosis not present

## 2020-01-05 DIAGNOSIS — Z801 Family history of malignant neoplasm of trachea, bronchus and lung: Secondary | ICD-10-CM | POA: Diagnosis not present

## 2020-01-05 DIAGNOSIS — Z87891 Personal history of nicotine dependence: Secondary | ICD-10-CM | POA: Insufficient documentation

## 2020-01-05 DIAGNOSIS — Z8349 Family history of other endocrine, nutritional and metabolic diseases: Secondary | ICD-10-CM | POA: Diagnosis not present

## 2020-01-05 DIAGNOSIS — E538 Deficiency of other specified B group vitamins: Secondary | ICD-10-CM | POA: Diagnosis not present

## 2020-01-05 LAB — CBC WITH DIFFERENTIAL/PLATELET
Abs Immature Granulocytes: 0.01 10*3/uL (ref 0.00–0.07)
Basophils Absolute: 0 10*3/uL (ref 0.0–0.1)
Basophils Relative: 1 %
Eosinophils Absolute: 0.1 10*3/uL (ref 0.0–0.5)
Eosinophils Relative: 2 %
HCT: 41.9 % (ref 39.0–52.0)
Hemoglobin: 14.1 g/dL (ref 13.0–17.0)
Immature Granulocytes: 0 %
Lymphocytes Relative: 28 %
Lymphs Abs: 1.3 10*3/uL (ref 0.7–4.0)
MCH: 34.3 pg — ABNORMAL HIGH (ref 26.0–34.0)
MCHC: 33.7 g/dL (ref 30.0–36.0)
MCV: 101.9 fL — ABNORMAL HIGH (ref 80.0–100.0)
Monocytes Absolute: 0.5 10*3/uL (ref 0.1–1.0)
Monocytes Relative: 10 %
Neutro Abs: 2.7 10*3/uL (ref 1.7–7.7)
Neutrophils Relative %: 59 %
Platelets: 174 10*3/uL (ref 150–400)
RBC: 4.11 MIL/uL — ABNORMAL LOW (ref 4.22–5.81)
RDW: 13.5 % (ref 11.5–15.5)
WBC: 4.6 10*3/uL (ref 4.0–10.5)
nRBC: 0 % (ref 0.0–0.2)

## 2020-01-05 LAB — IRON AND TIBC
Iron: 30 ug/dL — ABNORMAL LOW (ref 45–182)
Saturation Ratios: 7 % — ABNORMAL LOW (ref 17.9–39.5)
TIBC: 442 ug/dL (ref 250–450)
UIBC: 412 ug/dL

## 2020-01-05 LAB — VITAMIN B12: Vitamin B-12: 221 pg/mL (ref 180–914)

## 2020-01-05 LAB — FERRITIN: Ferritin: 12 ng/mL — ABNORMAL LOW (ref 24–336)

## 2020-01-05 NOTE — Progress Notes (Signed)
Would like to discuss anemia, if he still is and why?

## 2020-01-12 DIAGNOSIS — M9903 Segmental and somatic dysfunction of lumbar region: Secondary | ICD-10-CM | POA: Diagnosis not present

## 2020-01-12 DIAGNOSIS — M5432 Sciatica, left side: Secondary | ICD-10-CM | POA: Diagnosis not present

## 2020-01-12 DIAGNOSIS — M955 Acquired deformity of pelvis: Secondary | ICD-10-CM | POA: Diagnosis not present

## 2020-01-12 DIAGNOSIS — M9905 Segmental and somatic dysfunction of pelvic region: Secondary | ICD-10-CM | POA: Diagnosis not present

## 2020-01-26 DIAGNOSIS — C44319 Basal cell carcinoma of skin of other parts of face: Secondary | ICD-10-CM | POA: Diagnosis not present

## 2020-02-09 DIAGNOSIS — M9905 Segmental and somatic dysfunction of pelvic region: Secondary | ICD-10-CM | POA: Diagnosis not present

## 2020-02-09 DIAGNOSIS — M5432 Sciatica, left side: Secondary | ICD-10-CM | POA: Diagnosis not present

## 2020-02-09 DIAGNOSIS — M9903 Segmental and somatic dysfunction of lumbar region: Secondary | ICD-10-CM | POA: Diagnosis not present

## 2020-02-09 DIAGNOSIS — M955 Acquired deformity of pelvis: Secondary | ICD-10-CM | POA: Diagnosis not present

## 2020-02-23 DIAGNOSIS — M955 Acquired deformity of pelvis: Secondary | ICD-10-CM | POA: Diagnosis not present

## 2020-02-23 DIAGNOSIS — M9903 Segmental and somatic dysfunction of lumbar region: Secondary | ICD-10-CM | POA: Diagnosis not present

## 2020-02-23 DIAGNOSIS — M9905 Segmental and somatic dysfunction of pelvic region: Secondary | ICD-10-CM | POA: Diagnosis not present

## 2020-02-23 DIAGNOSIS — M5432 Sciatica, left side: Secondary | ICD-10-CM | POA: Diagnosis not present

## 2020-03-08 DIAGNOSIS — M9903 Segmental and somatic dysfunction of lumbar region: Secondary | ICD-10-CM | POA: Diagnosis not present

## 2020-03-08 DIAGNOSIS — M9905 Segmental and somatic dysfunction of pelvic region: Secondary | ICD-10-CM | POA: Diagnosis not present

## 2020-03-08 DIAGNOSIS — M5432 Sciatica, left side: Secondary | ICD-10-CM | POA: Diagnosis not present

## 2020-03-08 DIAGNOSIS — M955 Acquired deformity of pelvis: Secondary | ICD-10-CM | POA: Diagnosis not present

## 2020-04-05 DIAGNOSIS — M9905 Segmental and somatic dysfunction of pelvic region: Secondary | ICD-10-CM | POA: Diagnosis not present

## 2020-04-05 DIAGNOSIS — M955 Acquired deformity of pelvis: Secondary | ICD-10-CM | POA: Diagnosis not present

## 2020-04-05 DIAGNOSIS — M9903 Segmental and somatic dysfunction of lumbar region: Secondary | ICD-10-CM | POA: Diagnosis not present

## 2020-04-05 DIAGNOSIS — M5432 Sciatica, left side: Secondary | ICD-10-CM | POA: Diagnosis not present

## 2020-05-03 DIAGNOSIS — M9903 Segmental and somatic dysfunction of lumbar region: Secondary | ICD-10-CM | POA: Diagnosis not present

## 2020-05-03 DIAGNOSIS — M9905 Segmental and somatic dysfunction of pelvic region: Secondary | ICD-10-CM | POA: Diagnosis not present

## 2020-05-03 DIAGNOSIS — M5432 Sciatica, left side: Secondary | ICD-10-CM | POA: Diagnosis not present

## 2020-05-03 DIAGNOSIS — M955 Acquired deformity of pelvis: Secondary | ICD-10-CM | POA: Diagnosis not present

## 2020-05-08 NOTE — Progress Notes (Signed)
Marcus Coleman  [phone] [phone]  ID: LINKON SIVERSON OB: [birth year]  MR#: [id number]  AOZ#:[id number]  Patient Care Team: Idelle Crouch, MD as PCP - General (Internal Medicine)  CHIEF COMPLAINT: Iron deficiency anemia.  INTERVAL HISTORY: Patient returns to clinic today for repeat laboratory work, further evaluation, and consideration of additional IV Feraheme.  He has noticed a slight increased weakness and fatigue over the past several weeks, but otherwise has felt well.  He has no neurologic complaints.  He denies any recent fevers or illnesses.  He has a good appetite and denies weight loss.  He denies any chest pain, shortness of breath, cough, or hemoptysis.  He denies any nausea, vomiting, constipation, or diarrhea.  He has no melena or hematochezia.  He has no urinary complaints.  Patient offers no further specific complaints today.  REVIEW OF SYSTEMS:   Review of Systems  Constitutional: Positive for malaise/fatigue. Negative for fever and weight loss.  Respiratory: Negative.  Negative for cough, hemoptysis and shortness of breath.   Cardiovascular: Negative.  Negative for chest pain and leg swelling.  Gastrointestinal: Negative.  Negative for abdominal pain, blood in stool and melena.  Genitourinary: Negative.  Negative for hematuria.  Musculoskeletal: Negative.  Negative for back pain.  Skin: Negative.  Negative for rash.  Neurological: Positive for weakness. Negative for dizziness, focal weakness and headaches.  Psychiatric/Behavioral: Negative.  The patient is not nervous/anxious.     As per HPI. Otherwise, a complete review of systems is negative.  PAST MEDICAL HISTORY: Past Medical History:  Diagnosis Date  . Anemia   . Chronic diarrhea   . DDD (degenerative disc disease), cervical   . Hyperlipidemia   . Hypertension    borderline  . IDA (iron deficiency anemia)   . Low testosterone   . Vasovagal syncope     PAST  SURGICAL HISTORY: Past Surgical History:  Procedure Laterality Date  . CARDIAC CATHETERIZATION    . catracts Bilateral   . CHOLECYSTECTOMY    . COLONOSCOPY WITH PROPOFOL N/A 06/26/2016   Procedure: COLONOSCOPY WITH PROPOFOL;  Surgeon: Manya Silvas, MD;  Location: [facility] ENDOSCOPY;  Service: Endoscopy;  Laterality: N/A;  . COLONOSCOPY WITH PROPOFOL N/A 07/03/2019   Procedure: COLONOSCOPY WITH PROPOFOL;  Surgeon: [name], MD;  Location: [facility] ENDOSCOPY;  Service: Gastroenterology;  Laterality: N/A;  . ENDOSCOPIC RETROGRADE CHOLANGIOPANCREATOGRAPHY (ERCP) WITH PROPOFOL    . ESOPHAGOGASTRODUODENOSCOPY N/A 04/19/2019   Procedure: ESOPHAGOGASTRODUODENOSCOPY (EGD);  Surgeon: [name], MD;  Location: [facility] ENDOSCOPY;  Service: Gastroenterology;  Laterality: N/A;  . ESOPHAGOGASTRODUODENOSCOPY (EGD) WITH PROPOFOL N/A 07/03/2019   Procedure: ESOPHAGOGASTRODUODENOSCOPY (EGD) WITH PROPOFOL;  Surgeon: [name], MD;  Location: [facility] ENDOSCOPY;  Service: Gastroenterology;  Laterality: N/A;  . ESOPHAGOGASTRODUODENOSCOPY (EGD) WITH PROPOFOL N/A 08/07/2019   Procedure: ESOPHAGOGASTRODUODENOSCOPY (EGD) WITH PROPOFOL;  Surgeon: Milus Banister, MD;  Location: [facility] ENDOSCOPY;  Service: Endoscopy;  Laterality: N/A;  . EUS N/A 08/07/2019   Procedure: UPPER ENDOSCOPIC ULTRASOUND (EUS) RADIAL;  Surgeon: Milus Banister, MD;  Location: [facility] ENDOSCOPY;  Service: Endoscopy;  Laterality: N/A;  . eyelid surgery    . FOOT SURGERY    . HEMORROIDECTOMY    . HERNIA REPAIR    . mrercp    . RHINOPLASTY    . TONSILLECTOMY    . WISDOM TOOTH EXTRACTION      FAMILY HISTORY: Family History  Problem Relation Age of Onset  . Hypothyroidism Mother   . Diabetes Mother   . Lung  cancer Mother     ADVANCED DIRECTIVES (Y/N):  N  HEALTH MAINTENANCE: Social History   Tobacco Use  . Smoking status: Former Smoker    Packs/day: 1.00    Years: 25.00    Pack years: 25.00    Types: Cigarettes    Quit date: 12/05/1986    Years  since quitting: 33.4  . Smokeless tobacco: Never Used  Vaping Use  . Vaping Use: Never used  Substance Use Topics  . Alcohol use: Yes    Alcohol/week: 14.0 standard drinks    Types: 14 Cans of beer per week    Comment: 2 beers per day/ a couple glasses of wine   . Drug use: No     Colonoscopy:  PAP:  Bone density:  Lipid panel:  Allergies  Allergen Reactions  . Nsaids     Can take Ibuprofen in moderation/high doses or prolonged use causes GI bleed    Current Outpatient Medications  Medication Sig Dispense Refill  . acetaminophen (TYLENOL) 325 MG tablet Take 650 mg by mouth every 6 (six) hours as needed (pain.).     Marland Kitchen cetirizine (ZYRTEC) 10 MG tablet Take 10 mg by mouth daily.    . pantoprazole (PROTONIX) 40 MG tablet Take 40 mg by mouth daily.    . tamsulosin (FLOMAX) 0.4 MG CAPS capsule Take 1 capsule (0.4 mg total) by mouth daily after breakfast. 90 capsule 0  . timolol (TIMOPTIC) 0.5 % ophthalmic solution Place 1 drop into both eyes daily.     Marland Kitchen atorvastatin (LIPITOR) 40 MG tablet Take 40 mg by mouth daily. (Patient not taking: No sig reported)    . docusate sodium (COLACE) 100 MG capsule Take 1 capsule (100 mg total) by mouth 2 (two) times daily as needed for mild constipation. (Patient not taking: No sig reported) 15 capsule 0   No current facility-administered medications for this visit.    OBJECTIVE: Vitals:   05/11/20 1310  BP: (!) 162/101  Pulse: 70  Resp: 18  Temp: (!) 96.9 F (36.1 C)  SpO2: 97%     Body mass index is 33.2 kg/m.    ECOG FS:0 - Asymptomatic  General: Well-developed, well-nourished, no acute distress. Eyes: Pink conjunctiva, anicteric sclera. HEENT: Normocephalic, moist mucous membranes. Lungs: No audible wheezing or coughing. Heart: Regular rate and rhythm. Abdomen: Soft, nontender, no obvious distention. Musculoskeletal: No edema, cyanosis, or clubbing. Neuro: Alert, answering all questions appropriately. Cranial nerves grossly  intact. Skin: No rashes or petechiae noted. Psych: Normal affect.  LAB RESULTS:  Lab Results  Component Value Date   NA 138 04/21/2019   K 3.4 (L) 04/21/2019   CL 106 04/21/2019   CO2 26 04/21/2019   GLUCOSE 112 (H) 04/21/2019   BUN 16 04/21/2019   CREATININE 0.69 04/21/2019   CALCIUM 8.0 (L) 04/21/2019   GFRNONAA >60 04/21/2019   GFRAA >60 04/21/2019    Lab Results  Component Value Date   WBC 5.4 05/10/2020   NEUTROABS 3.0 05/10/2020   HGB 11.1 (L) 05/10/2020   HCT 35.8 (L) 05/10/2020   MCV 90.4 05/10/2020   PLT 192 05/10/2020   Lab Results  Component Value Date   IRON 21 (L) 05/10/2020   TIBC 519 (H) 05/10/2020   IRONPCTSAT 4 (L) 05/10/2020   Lab Results  Component Value Date   FERRITIN 6 (L) 05/10/2020     STUDIES: No results found.  ASSESSMENT: Iron deficiency anemia  1. Iron deficiency anemia: Patient's hemoglobin and iron stores have trended  down and he is symptomatic.  He had a normal colonoscopy on June 26, 2016.  His most recent EGD on Aug 07, 2019 did not reveal any significant pathology.  Proceed with 510 mg IV Feraheme today.  Return to clinic in 1 week for a second infusion.  Patient will then return to clinic in 4 months with repeat laboratory work, further evaluation, and additional IV iron if needed. 2.  B12 deficiency: Proceed with 1000 mg IM B12 today.  Return to clinic as above.   I spent a total of 30 minutes reviewing chart data, face-to-face evaluation with the patient, counseling and coordination of care as detailed above.    Patient expressed understanding and was in agreement with this plan. He also understands that He can call clinic at any time with any questions, concerns, or complaints.    Lloyd Huger, MD   05/12/2020 2:05 PM

## 2020-05-10 ENCOUNTER — Inpatient Hospital Stay: Payer: PPO | Attending: Oncology

## 2020-05-10 DIAGNOSIS — Z79899 Other long term (current) drug therapy: Secondary | ICD-10-CM | POA: Diagnosis not present

## 2020-05-10 DIAGNOSIS — Z87891 Personal history of nicotine dependence: Secondary | ICD-10-CM | POA: Insufficient documentation

## 2020-05-10 DIAGNOSIS — D509 Iron deficiency anemia, unspecified: Secondary | ICD-10-CM | POA: Insufficient documentation

## 2020-05-10 LAB — CBC WITH DIFFERENTIAL/PLATELET
Abs Immature Granulocytes: 0.01 10*3/uL (ref 0.00–0.07)
Basophils Absolute: 0 10*3/uL (ref 0.0–0.1)
Basophils Relative: 1 %
Eosinophils Absolute: 0.1 10*3/uL (ref 0.0–0.5)
Eosinophils Relative: 3 %
HCT: 35.8 % — ABNORMAL LOW (ref 39.0–52.0)
Hemoglobin: 11.1 g/dL — ABNORMAL LOW (ref 13.0–17.0)
Immature Granulocytes: 0 %
Lymphocytes Relative: 29 %
Lymphs Abs: 1.6 10*3/uL (ref 0.7–4.0)
MCH: 28 pg (ref 26.0–34.0)
MCHC: 31 g/dL (ref 30.0–36.0)
MCV: 90.4 fL (ref 80.0–100.0)
Monocytes Absolute: 0.6 10*3/uL (ref 0.1–1.0)
Monocytes Relative: 12 %
Neutro Abs: 3 10*3/uL (ref 1.7–7.7)
Neutrophils Relative %: 55 %
Platelets: 192 10*3/uL (ref 150–400)
RBC: 3.96 MIL/uL — ABNORMAL LOW (ref 4.22–5.81)
RDW: 14.9 % (ref 11.5–15.5)
WBC: 5.4 10*3/uL (ref 4.0–10.5)
nRBC: 0 % (ref 0.0–0.2)

## 2020-05-10 LAB — IRON AND TIBC
Iron: 21 ug/dL — ABNORMAL LOW (ref 45–182)
Saturation Ratios: 4 % — ABNORMAL LOW (ref 17.9–39.5)
TIBC: 519 ug/dL — ABNORMAL HIGH (ref 250–450)
UIBC: 498 ug/dL

## 2020-05-10 LAB — FERRITIN: Ferritin: 6 ng/mL — ABNORMAL LOW (ref 24–336)

## 2020-05-10 LAB — VITAMIN B12: Vitamin B-12: 160 pg/mL — ABNORMAL LOW (ref 180–914)

## 2020-05-11 ENCOUNTER — Inpatient Hospital Stay: Payer: PPO | Attending: Oncology | Admitting: Oncology

## 2020-05-11 ENCOUNTER — Encounter: Payer: Self-pay | Admitting: Oncology

## 2020-05-11 ENCOUNTER — Inpatient Hospital Stay: Payer: PPO

## 2020-05-11 VITALS — BP 148/72 | HR 63

## 2020-05-11 VITALS — BP 162/101 | HR 70 | Temp 96.9°F | Resp 18 | Wt 205.7 lb

## 2020-05-11 DIAGNOSIS — D509 Iron deficiency anemia, unspecified: Secondary | ICD-10-CM

## 2020-05-11 DIAGNOSIS — E538 Deficiency of other specified B group vitamins: Secondary | ICD-10-CM | POA: Insufficient documentation

## 2020-05-11 MED ORDER — CYANOCOBALAMIN 1000 MCG/ML IJ SOLN
1000.0000 ug | Freq: Once | INTRAMUSCULAR | Status: AC
  Administered 2020-05-11: 1000 ug via INTRAMUSCULAR
  Filled 2020-05-11: qty 1

## 2020-05-11 MED ORDER — SODIUM CHLORIDE 0.9 % IV SOLN
510.0000 mg | Freq: Once | INTRAVENOUS | Status: AC
  Administered 2020-05-11: 510 mg via INTRAVENOUS
  Filled 2020-05-11: qty 510

## 2020-05-11 MED ORDER — SODIUM CHLORIDE 0.9 % IV SOLN
Freq: Once | INTRAVENOUS | Status: AC
  Filled 2020-05-11: qty 250

## 2020-05-11 NOTE — Progress Notes (Unsigned)
Patient here for oncology follow-up appointment, expresses no complaints or concerns at this time.    

## 2020-05-18 ENCOUNTER — Inpatient Hospital Stay: Payer: PPO

## 2020-05-18 VITALS — BP 130/74 | HR 95 | Temp 97.4°F

## 2020-05-18 DIAGNOSIS — D509 Iron deficiency anemia, unspecified: Secondary | ICD-10-CM | POA: Diagnosis not present

## 2020-05-18 MED ORDER — SODIUM CHLORIDE 0.9 % IV SOLN
Freq: Once | INTRAVENOUS | Status: AC
  Filled 2020-05-18: qty 250

## 2020-05-18 MED ORDER — SODIUM CHLORIDE 0.9 % IV SOLN
510.0000 mg | Freq: Once | INTRAVENOUS | Status: AC
  Administered 2020-05-18: 510 mg via INTRAVENOUS
  Filled 2020-05-18: qty 510

## 2020-05-27 DIAGNOSIS — H401111 Primary open-angle glaucoma, right eye, mild stage: Secondary | ICD-10-CM | POA: Diagnosis not present

## 2020-05-28 DIAGNOSIS — Z111 Encounter for screening for respiratory tuberculosis: Secondary | ICD-10-CM | POA: Diagnosis not present

## 2020-05-31 DIAGNOSIS — M9905 Segmental and somatic dysfunction of pelvic region: Secondary | ICD-10-CM | POA: Diagnosis not present

## 2020-05-31 DIAGNOSIS — Z125 Encounter for screening for malignant neoplasm of prostate: Secondary | ICD-10-CM | POA: Diagnosis not present

## 2020-05-31 DIAGNOSIS — M5432 Sciatica, left side: Secondary | ICD-10-CM | POA: Diagnosis not present

## 2020-05-31 DIAGNOSIS — M955 Acquired deformity of pelvis: Secondary | ICD-10-CM | POA: Diagnosis not present

## 2020-05-31 DIAGNOSIS — Z79899 Other long term (current) drug therapy: Secondary | ICD-10-CM | POA: Diagnosis not present

## 2020-05-31 DIAGNOSIS — M9903 Segmental and somatic dysfunction of lumbar region: Secondary | ICD-10-CM | POA: Diagnosis not present

## 2020-05-31 DIAGNOSIS — E7800 Pure hypercholesterolemia, unspecified: Secondary | ICD-10-CM | POA: Diagnosis not present

## 2020-06-03 DIAGNOSIS — D225 Melanocytic nevi of trunk: Secondary | ICD-10-CM | POA: Diagnosis not present

## 2020-06-03 DIAGNOSIS — X32XXXA Exposure to sunlight, initial encounter: Secondary | ICD-10-CM | POA: Diagnosis not present

## 2020-06-03 DIAGNOSIS — D2261 Melanocytic nevi of right upper limb, including shoulder: Secondary | ICD-10-CM | POA: Diagnosis not present

## 2020-06-03 DIAGNOSIS — L821 Other seborrheic keratosis: Secondary | ICD-10-CM | POA: Diagnosis not present

## 2020-06-03 DIAGNOSIS — D2271 Melanocytic nevi of right lower limb, including hip: Secondary | ICD-10-CM | POA: Diagnosis not present

## 2020-06-03 DIAGNOSIS — L570 Actinic keratosis: Secondary | ICD-10-CM | POA: Diagnosis not present

## 2020-06-03 DIAGNOSIS — Z85828 Personal history of other malignant neoplasm of skin: Secondary | ICD-10-CM | POA: Diagnosis not present

## 2020-06-03 DIAGNOSIS — D2262 Melanocytic nevi of left upper limb, including shoulder: Secondary | ICD-10-CM | POA: Diagnosis not present

## 2020-06-07 DIAGNOSIS — Z0000 Encounter for general adult medical examination without abnormal findings: Secondary | ICD-10-CM | POA: Diagnosis not present

## 2020-06-07 DIAGNOSIS — E7800 Pure hypercholesterolemia, unspecified: Secondary | ICD-10-CM | POA: Diagnosis not present

## 2020-06-07 DIAGNOSIS — M5416 Radiculopathy, lumbar region: Secondary | ICD-10-CM | POA: Diagnosis not present

## 2020-06-07 DIAGNOSIS — R030 Elevated blood-pressure reading, without diagnosis of hypertension: Secondary | ICD-10-CM | POA: Diagnosis not present

## 2020-06-07 DIAGNOSIS — D508 Other iron deficiency anemias: Secondary | ICD-10-CM | POA: Diagnosis not present

## 2020-06-07 DIAGNOSIS — R7989 Other specified abnormal findings of blood chemistry: Secondary | ICD-10-CM | POA: Diagnosis not present

## 2020-06-10 DIAGNOSIS — R030 Elevated blood-pressure reading, without diagnosis of hypertension: Secondary | ICD-10-CM | POA: Diagnosis not present

## 2020-06-10 DIAGNOSIS — E7800 Pure hypercholesterolemia, unspecified: Secondary | ICD-10-CM | POA: Diagnosis not present

## 2020-06-10 DIAGNOSIS — I340 Nonrheumatic mitral (valve) insufficiency: Secondary | ICD-10-CM | POA: Diagnosis not present

## 2020-06-11 ENCOUNTER — Other Ambulatory Visit: Payer: Self-pay | Admitting: *Deleted

## 2020-06-11 ENCOUNTER — Telehealth: Payer: Self-pay | Admitting: Urology

## 2020-06-11 MED ORDER — TAMSULOSIN HCL 0.4 MG PO CAPS: 0.4000 mg | ORAL_CAPSULE | Freq: Every day | ORAL | 0 refills | Status: DC

## 2020-06-11 NOTE — Telephone Encounter (Signed)
Pt needs a refill for Tamsulosin 0/4 mg to [facility] on [address]

## 2020-06-28 DIAGNOSIS — M9903 Segmental and somatic dysfunction of lumbar region: Secondary | ICD-10-CM | POA: Diagnosis not present

## 2020-06-28 DIAGNOSIS — M9905 Segmental and somatic dysfunction of pelvic region: Secondary | ICD-10-CM | POA: Diagnosis not present

## 2020-06-28 DIAGNOSIS — M5432 Sciatica, left side: Secondary | ICD-10-CM | POA: Diagnosis not present

## 2020-06-28 DIAGNOSIS — M955 Acquired deformity of pelvis: Secondary | ICD-10-CM | POA: Diagnosis not present

## 2020-07-19 DIAGNOSIS — E7800 Pure hypercholesterolemia, unspecified: Secondary | ICD-10-CM | POA: Diagnosis not present

## 2020-07-19 DIAGNOSIS — R030 Elevated blood-pressure reading, without diagnosis of hypertension: Secondary | ICD-10-CM | POA: Diagnosis not present

## 2020-07-19 DIAGNOSIS — I340 Nonrheumatic mitral (valve) insufficiency: Secondary | ICD-10-CM | POA: Diagnosis not present

## 2020-07-26 DIAGNOSIS — M9903 Segmental and somatic dysfunction of lumbar region: Secondary | ICD-10-CM | POA: Diagnosis not present

## 2020-07-26 DIAGNOSIS — M5432 Sciatica, left side: Secondary | ICD-10-CM | POA: Diagnosis not present

## 2020-07-26 DIAGNOSIS — M9905 Segmental and somatic dysfunction of pelvic region: Secondary | ICD-10-CM | POA: Diagnosis not present

## 2020-07-26 DIAGNOSIS — M955 Acquired deformity of pelvis: Secondary | ICD-10-CM | POA: Diagnosis not present

## 2020-08-02 ENCOUNTER — Other Ambulatory Visit: Payer: Self-pay

## 2020-08-02 ENCOUNTER — Other Ambulatory Visit (HOSPITAL_COMMUNITY): Payer: Self-pay | Admitting: Physician Assistant

## 2020-08-02 ENCOUNTER — Ambulatory Visit
Admission: RE | Admit: 2020-08-02 | Discharge: 2020-08-02 | Disposition: A | Discharge status: Home / Self Care | Payer: PPO | Source: Ambulatory Visit | Attending: Physician Assistant | Admitting: Physician Assistant

## 2020-08-02 ENCOUNTER — Other Ambulatory Visit: Payer: Self-pay | Admitting: Physician Assistant

## 2020-08-02 DIAGNOSIS — W19XXXA Unspecified fall, initial encounter: Secondary | ICD-10-CM | POA: Insufficient documentation

## 2020-08-02 DIAGNOSIS — G311 Senile degeneration of brain, not elsewhere classified: Secondary | ICD-10-CM | POA: Insufficient documentation

## 2020-08-02 DIAGNOSIS — Y92009 Unspecified place in unspecified non-institutional (private) residence as the place of occurrence of the external cause: Secondary | ICD-10-CM

## 2020-08-02 DIAGNOSIS — R410 Disorientation, unspecified: Secondary | ICD-10-CM

## 2020-08-02 DIAGNOSIS — W010XXA Fall on same level from slipping, tripping and stumbling without subsequent striking against object, initial encounter: Secondary | ICD-10-CM | POA: Diagnosis not present

## 2020-08-02 DIAGNOSIS — M7989 Other specified soft tissue disorders: Secondary | ICD-10-CM | POA: Diagnosis not present

## 2020-08-02 DIAGNOSIS — Z043 Encounter for examination and observation following other accident: Secondary | ICD-10-CM | POA: Diagnosis not present

## 2020-08-02 DIAGNOSIS — I6782 Cerebral ischemia: Secondary | ICD-10-CM | POA: Diagnosis not present

## 2020-08-02 DIAGNOSIS — M542 Cervicalgia: Secondary | ICD-10-CM | POA: Diagnosis not present

## 2020-08-02 DIAGNOSIS — M25531 Pain in right wrist: Secondary | ICD-10-CM | POA: Diagnosis not present

## 2020-08-05 DIAGNOSIS — I340 Nonrheumatic mitral (valve) insufficiency: Secondary | ICD-10-CM | POA: Diagnosis not present

## 2020-08-05 DIAGNOSIS — R030 Elevated blood-pressure reading, without diagnosis of hypertension: Secondary | ICD-10-CM | POA: Diagnosis not present

## 2020-08-05 DIAGNOSIS — Z8719 Personal history of other diseases of the digestive system: Secondary | ICD-10-CM | POA: Diagnosis not present

## 2020-08-05 DIAGNOSIS — I499 Cardiac arrhythmia, unspecified: Secondary | ICD-10-CM | POA: Diagnosis not present

## 2020-08-05 DIAGNOSIS — E7800 Pure hypercholesterolemia, unspecified: Secondary | ICD-10-CM | POA: Diagnosis not present

## 2020-08-05 DIAGNOSIS — Z86718 Personal history of other venous thrombosis and embolism: Secondary | ICD-10-CM | POA: Diagnosis not present

## 2020-08-12 DIAGNOSIS — W010XXA Fall on same level from slipping, tripping and stumbling without subsequent striking against object, initial encounter: Secondary | ICD-10-CM | POA: Diagnosis not present

## 2020-08-12 DIAGNOSIS — M25431 Effusion, right wrist: Secondary | ICD-10-CM | POA: Diagnosis not present

## 2020-08-12 DIAGNOSIS — M1811 Unilateral primary osteoarthritis of first carpometacarpal joint, right hand: Secondary | ICD-10-CM | POA: Diagnosis not present

## 2020-08-12 DIAGNOSIS — M19131 Post-traumatic osteoarthritis, right wrist: Secondary | ICD-10-CM | POA: Diagnosis not present

## 2020-08-12 DIAGNOSIS — M11241 Other chondrocalcinosis, right hand: Secondary | ICD-10-CM | POA: Diagnosis not present

## 2020-08-23 DIAGNOSIS — M5432 Sciatica, left side: Secondary | ICD-10-CM | POA: Diagnosis not present

## 2020-08-23 DIAGNOSIS — M955 Acquired deformity of pelvis: Secondary | ICD-10-CM | POA: Diagnosis not present

## 2020-08-23 DIAGNOSIS — M9903 Segmental and somatic dysfunction of lumbar region: Secondary | ICD-10-CM | POA: Diagnosis not present

## 2020-08-23 DIAGNOSIS — M9905 Segmental and somatic dysfunction of pelvic region: Secondary | ICD-10-CM | POA: Diagnosis not present

## 2020-09-03 ENCOUNTER — Inpatient Hospital Stay: Payer: PPO | Attending: Oncology

## 2020-09-03 DIAGNOSIS — E538 Deficiency of other specified B group vitamins: Secondary | ICD-10-CM | POA: Diagnosis not present

## 2020-09-03 DIAGNOSIS — Z79899 Other long term (current) drug therapy: Secondary | ICD-10-CM | POA: Diagnosis not present

## 2020-09-03 DIAGNOSIS — D509 Iron deficiency anemia, unspecified: Secondary | ICD-10-CM | POA: Diagnosis not present

## 2020-09-03 LAB — CBC WITH DIFFERENTIAL/PLATELET
Abs Immature Granulocytes: 0.02 10*3/uL (ref 0.00–0.07)
Basophils Absolute: 0 10*3/uL (ref 0.0–0.1)
Basophils Relative: 0 %
Eosinophils Absolute: 0.2 10*3/uL (ref 0.0–0.5)
Eosinophils Relative: 4 %
HCT: 41.3 % (ref 39.0–52.0)
Hemoglobin: 13.9 g/dL (ref 13.0–17.0)
Immature Granulocytes: 0 %
Lymphocytes Relative: 22 %
Lymphs Abs: 1.3 10*3/uL (ref 0.7–4.0)
MCH: 33.2 pg (ref 26.0–34.0)
MCHC: 33.7 g/dL (ref 30.0–36.0)
MCV: 98.6 fL (ref 80.0–100.0)
Monocytes Absolute: 0.5 10*3/uL (ref 0.1–1.0)
Monocytes Relative: 9 %
Neutro Abs: 3.9 10*3/uL (ref 1.7–7.7)
Neutrophils Relative %: 65 %
Platelets: 192 10*3/uL (ref 150–400)
RBC: 4.19 MIL/uL — ABNORMAL LOW (ref 4.22–5.81)
RDW: 13.6 % (ref 11.5–15.5)
WBC: 6 10*3/uL (ref 4.0–10.5)
nRBC: 0 % (ref 0.0–0.2)

## 2020-09-03 LAB — IRON AND TIBC
Iron: 104 ug/dL (ref 45–182)
Saturation Ratios: 28 % (ref 17.9–39.5)
TIBC: 375 ug/dL (ref 250–450)
UIBC: 271 ug/dL

## 2020-09-03 LAB — VITAMIN B12: Vitamin B-12: 202 pg/mL (ref 180–914)

## 2020-09-03 LAB — FERRITIN: Ferritin: 30 ng/mL (ref 24–336)

## 2020-09-06 ENCOUNTER — Inpatient Hospital Stay: Payer: PPO

## 2020-09-06 ENCOUNTER — Inpatient Hospital Stay (HOSPITAL_BASED_OUTPATIENT_CLINIC_OR_DEPARTMENT_OTHER): Payer: PPO | Admitting: Nurse Practitioner

## 2020-09-06 ENCOUNTER — Other Ambulatory Visit: Payer: Self-pay

## 2020-09-06 ENCOUNTER — Encounter: Payer: Self-pay | Admitting: Nurse Practitioner

## 2020-09-06 VITALS — BP 122/68 | HR 70 | Temp 97.1°F | Resp 16 | Wt 195.1 lb

## 2020-09-06 DIAGNOSIS — D509 Iron deficiency anemia, unspecified: Secondary | ICD-10-CM

## 2020-09-06 DIAGNOSIS — E538 Deficiency of other specified B group vitamins: Secondary | ICD-10-CM

## 2020-09-06 MED ORDER — CYANOCOBALAMIN 1000 MCG/ML IJ SOLN
1000.0000 ug | Freq: Once | INTRAMUSCULAR | Status: AC
  Administered 2020-09-06: 1000 ug via INTRAMUSCULAR
  Filled 2020-09-06: qty 1

## 2020-09-06 NOTE — Progress Notes (Signed)
[facility]  [phone] [phone]) [phone]  ID: Marcus Coleman OB: 25-Jun-1940  MR#: [id number]  CXK#:[id number]  Patient Care Team: Idelle Crouch, MD as PCP - General (Internal Medicine)  CHIEF COMPLAINT: Iron deficiency anemia.  INTERVAL HISTORY: Patient returns to clinic for repeat labs, further evaluation, and consideration of IV Feraheme.  Feels well.  Fatigue is improved since Marcus Coleman received Feraheme Marcus Coleman denies neurologic complaints, no recent fevers or illness.  His appetite is stable and Marcus Coleman denies weight loss.  No chest pain, shortness of breath, cough, hemoptysis.  No nausea, vomiting, constipation, diarrhea.  No melena or hematochezia.  No urinary complaints.  No further specific complaints today.  REVIEW OF SYSTEMS:   Review of Systems  Constitutional:  Negative for chills, fever, malaise/fatigue and weight loss.  HENT:  Negative for hearing loss, nosebleeds, sore throat and tinnitus.   Eyes:  Negative for blurred vision and double vision.  Respiratory:  Negative for cough, hemoptysis, shortness of breath and wheezing.   Cardiovascular:  Negative for chest pain, palpitations and leg swelling.  Gastrointestinal:  Negative for abdominal pain, blood in stool, constipation, diarrhea, melena, nausea and vomiting.  Genitourinary:  Negative for dysuria and urgency.  Musculoskeletal:  Negative for back pain, falls, joint pain and myalgias.  Skin:  Negative for itching and rash.  Neurological:  Negative for dizziness, tingling, sensory change, loss of consciousness, weakness and headaches.  Endo/Heme/Allergies:  Negative for environmental allergies. Does not bruise/bleed easily.  Psychiatric/Behavioral:  Negative for depression. The patient is not nervous/anxious and does not have insomnia.   As per HPI. Otherwise, a complete review of systems is negative.  PAST MEDICAL HISTORY: Past Medical History:  Diagnosis Date   Anemia    Chronic diarrhea    DDD  (degenerative disc disease), cervical    Hyperlipidemia    Hypertension    borderline   IDA (iron deficiency anemia)    Low testosterone    Vasovagal syncope     PAST SURGICAL HISTORY: Past Surgical History:  Procedure Laterality Date   CARDIAC CATHETERIZATION     catracts Bilateral    CHOLECYSTECTOMY     COLONOSCOPY WITH PROPOFOL N/A 06/26/2016   Procedure: COLONOSCOPY WITH PROPOFOL;  Surgeon: Manya Silvas, MD;  Location: [facility] ENDOSCOPY;  Service: Endoscopy;  Laterality: N/A;   COLONOSCOPY WITH PROPOFOL N/A 07/03/2019   Procedure: COLONOSCOPY WITH PROPOFOL;  Surgeon: [name], MD;  Location: [facility] ENDOSCOPY;  Service: Gastroenterology;  Laterality: N/A;   ENDOSCOPIC RETROGRADE CHOLANGIOPANCREATOGRAPHY (ERCP) WITH PROPOFOL     ESOPHAGOGASTRODUODENOSCOPY N/A 04/19/2019   Procedure: ESOPHAGOGASTRODUODENOSCOPY (EGD);  Surgeon: [name], MD;  Location: [facility] ENDOSCOPY;  Service: Gastroenterology;  Laterality: N/A;   ESOPHAGOGASTRODUODENOSCOPY (EGD) WITH PROPOFOL N/A 07/03/2019   Procedure: ESOPHAGOGASTRODUODENOSCOPY (EGD) WITH PROPOFOL;  Surgeon: [name], MD;  Location: [facility] ENDOSCOPY;  Service: Gastroenterology;  Laterality: N/A;   ESOPHAGOGASTRODUODENOSCOPY (EGD) WITH PROPOFOL N/A 08/07/2019   Procedure: ESOPHAGOGASTRODUODENOSCOPY (EGD) WITH PROPOFOL;  Surgeon: Milus Banister, MD;  Location: [facility] ENDOSCOPY;  Service: Endoscopy;  Laterality: N/A;   EUS N/A 08/07/2019   Procedure: UPPER ENDOSCOPIC ULTRASOUND (EUS) RADIAL;  Surgeon: Milus Banister, MD;  Location: [facility] ENDOSCOPY;  Service: Endoscopy;  Laterality: N/A;   eyelid surgery     FOOT SURGERY     HEMORROIDECTOMY     HERNIA REPAIR     mrercp     RHINOPLASTY     TONSILLECTOMY     WISDOM TOOTH EXTRACTION      FAMILY  HISTORY: Family History  Problem Relation Age of Onset   Hypothyroidism Mother    Diabetes Mother    Lung cancer Mother     ADVANCED DIRECTIVES (Y/N):  N  HEALTH MAINTENANCE: Social History    Tobacco Use   Smoking status: Former    Packs/day: 1.00    Years: 25.00    Pack years: 25.00    Types: Cigarettes    Quit date: 12/05/1986    Years since quitting: 33.7   Smokeless tobacco: Never  Vaping Use   Vaping Use: Never used  Substance Use Topics   Alcohol use: Yes    Alcohol/week: 14.0 standard drinks    Types: 14 Cans of beer per week    Comment: 2 beers per day/ a couple glasses of wine    Drug use: No    Colonoscopy:  PAP:  Bone density:  Lipid panel:  Allergies  Allergen Reactions   Nsaids     Can take Ibuprofen in moderation/high doses or prolonged use causes GI bleed    Current Outpatient Medications  Medication Sig Dispense Refill   acetaminophen (TYLENOL) 325 MG tablet Take 650 mg by mouth every 6 (six) hours as needed (pain.).      cetirizine (ZYRTEC) 10 MG tablet Take 10 mg by mouth daily.     clindamycin (CLEOCIN T) 1 % external solution Apply 1 % topically.     tamsulosin (FLOMAX) 0.4 MG CAPS capsule Take 1 capsule (0.4 mg total) by mouth daily after breakfast. 90 capsule 0   timolol (TIMOPTIC) 0.5 % ophthalmic solution Place 1 drop into both eyes daily.      atorvastatin (LIPITOR) 40 MG tablet Take 40 mg by mouth daily. (Patient not taking: No sig reported)     docusate sodium (COLACE) 100 MG capsule Take 1 capsule (100 mg total) by mouth 2 (two) times daily as needed for mild constipation. (Patient not taking: No sig reported) 15 capsule 0   pantoprazole (PROTONIX) 40 MG tablet Take 40 mg by mouth daily. (Patient not taking: Reported on 09/06/2020)     No current facility-administered medications for this visit.    OBJECTIVE: Vitals:   09/06/20 1333  BP: 122/68  Pulse: 70  Resp: 16  Temp: (!) 97.1 F (36.2 C)     Body mass index is 31.49 kg/m.    ECOG FS:0 - Asymptomatic  General: Well-developed, well-nourished, no acute distress. Eyes: Pink conjunctiva, anicteric sclera. Lungs: Clear to auscultation bilaterally.  No audible wheezing or  coughing Heart: Regular rate and rhythm.  Abdomen: Soft, nontender, nondistended.  Musculoskeletal: No edema, cyanosis, or clubbing. Neuro: Alert, answering all questions appropriately. Cranial nerves grossly intact. Skin: No rashes or petechiae noted. Psych: Normal affect.  LAB RESULTS:  Lab Results  Component Value Date   NA 138 04/21/2019   K 3.4 (L) 04/21/2019   CL 106 04/21/2019   CO2 26 04/21/2019   GLUCOSE 112 (H) 04/21/2019   BUN 16 04/21/2019   CREATININE 0.69 04/21/2019   CALCIUM 8.0 (L) 04/21/2019   GFRNONAA >60 04/21/2019   GFRAA >60 04/21/2019    Lab Results  Component Value Date   WBC 6.0 09/03/2020   NEUTROABS 3.9 09/03/2020   HGB 13.9 09/03/2020   HCT 41.3 09/03/2020   MCV 98.6 09/03/2020   PLT 192 09/03/2020   Lab Results  Component Value Date   IRON 104 09/03/2020   TIBC 375 09/03/2020   IRONPCTSAT 28 09/03/2020   Lab Results  Component Value Date  FERRITIN 30 09/03/2020     STUDIES: No results found.  ASSESSMENT: Iron deficiency anemia  1. Iron deficiency anemia: S/p feraheme 05/11/20 and 05/18/20. Iron stores and hemoglobin have improved. Endoscopy with Dr. Ardis Hughs 08/07/19 for gastric ulcer. Colonoscopy with [name] 07/03/19. I don't see that Marcus Coleman has follow up scheduled with either. If counts downtrend, would recommend Marcus Coleman see GI again. No indication for IV iron today. Return to clinic in 4 months for labs (cbc, ferritin, iron and tibc), MD/Finn, possible feraheme.   2. B12 deficiency- b12 decreased. Proceed with b12 today and monthly.   Plan:  B12 today and monthly x 4 No feraheme today 4 months - lab, MD/Finn, poss feraheme- la   Patient expressed understanding and was in agreement with this plan. Marcus Coleman also understands that Marcus Coleman can call clinic at any time with any questions, concerns, or complaints.    Verlon Au, NP   09/06/2020 4:24 PM

## 2020-09-06 NOTE — Progress Notes (Signed)
Patient denies new problems/concerns today.   °

## 2020-09-06 NOTE — Patient Instructions (Signed)
[facility]  Discharge Instructions: Thank you for choosing [facility] to provide your oncology and hematology care.  If you have a lab appointment with the [facility], please go directly to the [facility] and check in at the registration area.  Wear comfortable clothing and clothing appropriate for easy access to any Portacath or PICC line.   We strive to give you quality time with your provider. You may need to reschedule your appointment if you arrive late (15 or more minutes).  Arriving late affects you and other patients whose appointments are after yours.  Also, if you miss three or more appointments without notifying the office, you may be dismissed from the clinic at the provider's discretion.      For prescription refill requests, have your pharmacy contact our office and allow 72 hours for refills to be completed.    Today you received Vit B12 shot   To help prevent nausea and vomiting after your treatment, we encourage you to take your nausea medication as directed.  BELOW ARE SYMPTOMS THAT SHOULD BE REPORTED IMMEDIATELY: *FEVER GREATER THAN 100.4 F (38 C) OR HIGHER *CHILLS OR SWEATING *NAUSEA AND VOMITING THAT IS NOT CONTROLLED WITH YOUR NAUSEA MEDICATION *UNUSUAL SHORTNESS OF BREATH *UNUSUAL BRUISING OR BLEEDING *URINARY PROBLEMS (pain or burning when urinating, or frequent urination) *BOWEL PROBLEMS (unusual diarrhea, constipation, pain near the anus) TENDERNESS IN MOUTH AND THROAT WITH OR WITHOUT PRESENCE OF ULCERS (sore throat, sores in mouth, or a toothache) UNUSUAL RASH, SWELLING OR PAIN  UNUSUAL VAGINAL DISCHARGE OR ITCHING   Items with * indicate a potential emergency and should be followed up as soon as possible or go to the Emergency Department if any problems should occur.  Please show the CHEMOTHERAPY ALERT CARD or IMMUNOTHERAPY ALERT CARD at check-in to the Emergency Department and triage nurse.  Should  you have questions after your visit or need to cancel or reschedule your appointment, please contact [facility]  [phone] and follow the prompts.  Office hours are 8:00 a.m. to 4:30 p.m. Monday - Friday. Please note that voicemails left after 4:00 p.m. may not be returned until the following business day.  We are closed weekends and major holidays. You have access to a nurse at all times for urgent questions. Please call the main number to the clinic [phone] and follow the prompts.  For any non-urgent questions, you may also contact your provider using MyChart. We now offer e-Visits for anyone 63 and older to request care online for non-urgent symptoms. For details visit [email or web address].   Also download the MyChart app! Go to the app store, search "MyChart", open the app, select [facility], and log in with your MyChart username and password.  Due to Covid, a mask is required upon entering the hospital/clinic. If you do not have a mask, one will be given to you upon arrival. For doctor visits, patients may have 1 support person aged 19 or older with them. For treatment visits, patients cannot have anyone with them due to current Covid guidelines and our immunocompromised population.

## 2020-09-20 DIAGNOSIS — E782 Mixed hyperlipidemia: Secondary | ICD-10-CM | POA: Diagnosis not present

## 2020-09-20 DIAGNOSIS — R7989 Other specified abnormal findings of blood chemistry: Secondary | ICD-10-CM | POA: Diagnosis not present

## 2020-09-20 DIAGNOSIS — M25532 Pain in left wrist: Secondary | ICD-10-CM | POA: Diagnosis not present

## 2020-09-20 DIAGNOSIS — M5416 Radiculopathy, lumbar region: Secondary | ICD-10-CM | POA: Diagnosis not present

## 2020-09-20 DIAGNOSIS — I340 Nonrheumatic mitral (valve) insufficiency: Secondary | ICD-10-CM | POA: Diagnosis not present

## 2020-09-20 DIAGNOSIS — Z79899 Other long term (current) drug therapy: Secondary | ICD-10-CM | POA: Diagnosis not present

## 2020-09-20 DIAGNOSIS — D500 Iron deficiency anemia secondary to blood loss (chronic): Secondary | ICD-10-CM | POA: Diagnosis not present

## 2020-09-20 DIAGNOSIS — M25531 Pain in right wrist: Secondary | ICD-10-CM | POA: Diagnosis not present

## 2020-10-06 ENCOUNTER — Inpatient Hospital Stay: Payer: PPO | Attending: Oncology

## 2020-10-06 ENCOUNTER — Other Ambulatory Visit: Payer: Self-pay

## 2020-10-06 DIAGNOSIS — Z79899 Other long term (current) drug therapy: Secondary | ICD-10-CM | POA: Insufficient documentation

## 2020-10-06 DIAGNOSIS — D509 Iron deficiency anemia, unspecified: Secondary | ICD-10-CM | POA: Diagnosis not present

## 2020-10-06 DIAGNOSIS — E538 Deficiency of other specified B group vitamins: Secondary | ICD-10-CM | POA: Diagnosis not present

## 2020-10-06 MED ORDER — CYANOCOBALAMIN 1000 MCG/ML IJ SOLN
1000.0000 ug | Freq: Once | INTRAMUSCULAR | Status: AC
  Administered 2020-10-06: 1000 ug via INTRAMUSCULAR
  Filled 2020-10-06: qty 1

## 2020-10-08 DIAGNOSIS — M19032 Primary osteoarthritis, left wrist: Secondary | ICD-10-CM | POA: Diagnosis not present

## 2020-10-08 DIAGNOSIS — M19131 Post-traumatic osteoarthritis, right wrist: Secondary | ICD-10-CM | POA: Diagnosis not present

## 2020-10-11 DIAGNOSIS — M5136 Other intervertebral disc degeneration, lumbar region: Secondary | ICD-10-CM | POA: Diagnosis not present

## 2020-10-11 DIAGNOSIS — M47816 Spondylosis without myelopathy or radiculopathy, lumbar region: Secondary | ICD-10-CM | POA: Diagnosis not present

## 2020-10-11 DIAGNOSIS — M5416 Radiculopathy, lumbar region: Secondary | ICD-10-CM | POA: Diagnosis not present

## 2020-10-11 DIAGNOSIS — M48062 Spinal stenosis, lumbar region with neurogenic claudication: Secondary | ICD-10-CM | POA: Diagnosis not present

## 2020-10-27 DIAGNOSIS — M47816 Spondylosis without myelopathy or radiculopathy, lumbar region: Secondary | ICD-10-CM | POA: Diagnosis not present

## 2020-11-03 ENCOUNTER — Inpatient Hospital Stay: Payer: PPO | Attending: Oncology

## 2020-11-03 DIAGNOSIS — E538 Deficiency of other specified B group vitamins: Secondary | ICD-10-CM | POA: Diagnosis not present

## 2020-11-03 DIAGNOSIS — D509 Iron deficiency anemia, unspecified: Secondary | ICD-10-CM

## 2020-11-03 MED ORDER — CYANOCOBALAMIN 1000 MCG/ML IJ SOLN
1000.0000 ug | Freq: Once | INTRAMUSCULAR | Status: AC
  Administered 2020-11-03: 1000 ug via INTRAMUSCULAR
  Filled 2020-11-03: qty 1

## 2020-11-08 ENCOUNTER — Ambulatory Visit: Payer: Self-pay | Admitting: Urology

## 2020-11-11 ENCOUNTER — Encounter: Payer: Self-pay | Admitting: Urology

## 2020-11-11 ENCOUNTER — Other Ambulatory Visit: Payer: Self-pay

## 2020-11-11 ENCOUNTER — Ambulatory Visit: Payer: PPO | Admitting: Urology

## 2020-11-11 VITALS — BP 144/81 | HR 108 | Ht 71.0 in | Wt 185.0 lb

## 2020-11-11 DIAGNOSIS — N401 Enlarged prostate with lower urinary tract symptoms: Secondary | ICD-10-CM

## 2020-11-11 LAB — BLADDER SCAN AMB NON-IMAGING: Scan Result: 1

## 2020-11-11 MED ORDER — TAMSULOSIN HCL 0.4 MG PO CAPS: 0.4000 mg | ORAL_CAPSULE | Freq: Every day | ORAL | 3 refills | Status: DC

## 2020-11-11 NOTE — Progress Notes (Signed)
11/11/2020 11:13 AM   Marcus Coleman 01-14-1941 [id number]  Referring provider: Idelle Crouch, MD [facility] [place],  [place] [address]  Chief Complaint  Patient presents with   Benign Prostatic Hypertrophy    HPI: 80 y.o. male presents for annual follow-up BPH  Since last years visit he has noted some increase urgency and frequency in the morning He takes his tamsulosin sporadically but does state when he takes the medication his symptoms are improved Denies dysuria, gross hematuria No flank, abdominal or pelvic pain IPSS today 13/25 (10/25 last year) PCP has continued checking PSAs and was 0.21 on 05/31/2020     PMH: Past Medical History:  Diagnosis Date   Anemia    Chronic diarrhea    DDD (degenerative disc disease), cervical    Hyperlipidemia    Hypertension    borderline   IDA (iron deficiency anemia)    Low testosterone    Vasovagal syncope     Surgical History: Past Surgical History:  Procedure Laterality Date   CARDIAC CATHETERIZATION     catracts Bilateral    CHOLECYSTECTOMY     COLONOSCOPY WITH PROPOFOL N/A 06/26/2016   Procedure: COLONOSCOPY WITH PROPOFOL;  Surgeon: Manya Silvas, MD;  Location: [facility] ENDOSCOPY;  Service: Endoscopy;  Laterality: N/A;   COLONOSCOPY WITH PROPOFOL N/A 07/03/2019   Procedure: COLONOSCOPY WITH PROPOFOL;  Surgeon: [name], MD;  Location: [facility] ENDOSCOPY;  Service: Gastroenterology;  Laterality: N/A;   ENDOSCOPIC RETROGRADE CHOLANGIOPANCREATOGRAPHY (ERCP) WITH PROPOFOL     ESOPHAGOGASTRODUODENOSCOPY N/A 04/19/2019   Procedure: ESOPHAGOGASTRODUODENOSCOPY (EGD);  Surgeon: [name], MD;  Location: [facility] ENDOSCOPY;  Service: Gastroenterology;  Laterality: N/A;   ESOPHAGOGASTRODUODENOSCOPY (EGD) WITH PROPOFOL N/A 07/03/2019   Procedure: ESOPHAGOGASTRODUODENOSCOPY (EGD) WITH PROPOFOL;  Surgeon: [name], MD;  Location: [facility] ENDOSCOPY;  Service: Gastroenterology;  Laterality: N/A;    ESOPHAGOGASTRODUODENOSCOPY (EGD) WITH PROPOFOL N/A 08/07/2019   Procedure: ESOPHAGOGASTRODUODENOSCOPY (EGD) WITH PROPOFOL;  Surgeon: Milus Banister, MD;  Location: [facility] ENDOSCOPY;  Service: Endoscopy;  Laterality: N/A;   EUS N/A 08/07/2019   Procedure: UPPER ENDOSCOPIC ULTRASOUND (EUS) RADIAL;  Surgeon: Milus Banister, MD;  Location: [facility] ENDOSCOPY;  Service: Endoscopy;  Laterality: N/A;   eyelid surgery     FOOT SURGERY     HEMORROIDECTOMY     HERNIA REPAIR     mrercp     RHINOPLASTY     TONSILLECTOMY     WISDOM TOOTH EXTRACTION      Home Medications:  Allergies as of 11/11/2020       Reactions   Nsaids    Can take Ibuprofen in moderation/high doses or prolonged use causes GI bleed        Medication List        Accurate as of November 11, 2020 11:13 AM. If you have any questions, ask your nurse or doctor.          acetaminophen 325 MG tablet Commonly known as: TYLENOL Take 650 mg by mouth every 6 (six) hours as needed (pain.).   atorvastatin 40 MG tablet Commonly known as: LIPITOR Take 40 mg by mouth daily.   cetirizine 10 MG tablet Commonly known as: ZYRTEC Take 10 mg by mouth daily.   clindamycin 1 % external solution Commonly known as: CLEOCIN T Apply 1 % topically.   docusate sodium 100 MG capsule Commonly known as: COLACE Take 1 capsule (100 mg total) by mouth 2 (two) times daily as needed for mild constipation.  pantoprazole 40 MG tablet Commonly known as: PROTONIX Take 40 mg by mouth daily.   tamsulosin 0.4 MG Caps capsule Commonly known as: FLOMAX Take 1 capsule (0.4 mg total) by mouth daily after breakfast.   timolol 0.5 % ophthalmic solution Commonly known as: TIMOPTIC Place 1 drop into both eyes daily.        Allergies:  Allergies  Allergen Reactions   Nsaids     Can take Ibuprofen in moderation/high doses or prolonged use causes GI bleed    Family History: Family History  Problem Relation Age of Onset   Hypothyroidism Mother     Diabetes Mother    Lung cancer Mother     Social History:  reports that he quit smoking about 33 years ago. His smoking use included cigarettes. He has a 25.00 pack-year smoking history. He has never used smokeless tobacco. He reports current alcohol use of about 14.0 standard drinks per week. He reports that he does not use drugs.   Physical Exam: BP (!) 144/81   Ht '5\' 11"'$  (1.803 m)   Wt 185 lb (83.9 kg)   BMI 25.80 kg/m   Constitutional:  Alert and oriented, No acute distress. HEENT: [place] AT, moist mucus membranes.  Trachea midline, no masses. Cardiovascular: No clubbing, cyanosis, or edema. Respiratory: Normal respiratory effort, no increased work of breathing.    Assessment & Plan:    1. Benign prostatic hyperplasia with lower urinary tract symptoms Bladder scan PVR 1 mL Increased storage elated voiding symptoms though not bothersome enough that he desires additional treatment We discussed outlet procedures including UroLift Tamsulosin was refilled Continue annual follow-up and instructed call earlier for any worsening of his voiding pattern   Abbie Sons, MD  [facility] [address], [place], [place] [address] [phone]

## 2020-11-12 DIAGNOSIS — M47816 Spondylosis without myelopathy or radiculopathy, lumbar region: Secondary | ICD-10-CM | POA: Diagnosis not present

## 2020-11-12 LAB — URINALYSIS, COMPLETE
Bilirubin, UA: NEGATIVE
Glucose, UA: NEGATIVE
Leukocytes,UA: NEGATIVE
Nitrite, UA: NEGATIVE
Specific Gravity, UA: >1.03 — ABNORMAL HIGH (ref 1.005–1.030)
Urobilinogen, Ur: 0.2 mg/dL (ref 0.2–1.0)
pH, UA: 5 (ref 5.0–7.5)

## 2020-11-12 LAB — MICROSCOPIC EXAMINATION: Epithelial Cells (non renal): NONE SEEN /hpf (ref 0–10)

## 2020-11-23 ENCOUNTER — Other Ambulatory Visit: Payer: Self-pay

## 2020-11-23 ENCOUNTER — Ambulatory Visit (INDEPENDENT_AMBULATORY_CARE_PROVIDER_SITE_OTHER): Payer: PPO | Admitting: Vascular Surgery

## 2020-11-23 VITALS — BP 133/83 | HR 63 | Ht 66.0 in | Wt 195.0 lb

## 2020-11-23 DIAGNOSIS — R030 Elevated blood-pressure reading, without diagnosis of hypertension: Secondary | ICD-10-CM

## 2020-11-23 DIAGNOSIS — M5416 Radiculopathy, lumbar region: Secondary | ICD-10-CM

## 2020-11-23 DIAGNOSIS — M7989 Other specified soft tissue disorders: Secondary | ICD-10-CM | POA: Diagnosis not present

## 2020-11-23 DIAGNOSIS — Z86718 Personal history of other venous thrombosis and embolism: Secondary | ICD-10-CM

## 2020-11-23 DIAGNOSIS — M545 Low back pain, unspecified: Secondary | ICD-10-CM | POA: Insufficient documentation

## 2020-11-23 DIAGNOSIS — H269 Unspecified cataract: Secondary | ICD-10-CM | POA: Insufficient documentation

## 2020-11-23 DIAGNOSIS — Z9189 Other specified personal risk factors, not elsewhere classified: Secondary | ICD-10-CM | POA: Insufficient documentation

## 2020-11-23 DIAGNOSIS — F528 Other sexual dysfunction not due to a substance or known physiological condition: Secondary | ICD-10-CM | POA: Insufficient documentation

## 2020-11-23 DIAGNOSIS — H401111 Primary open-angle glaucoma, right eye, mild stage: Secondary | ICD-10-CM | POA: Diagnosis not present

## 2020-11-23 NOTE — Progress Notes (Signed)
MRN : [id number]  Marcus Coleman is a 81 y.o. (05-08-1940) male who presents with chief complaint of  Chief Complaint  Patient presents with   Follow-up    1 yr no studies   .  History of Present Illness: Patient returns today in follow up of his leg swelling.  He has a previous history of DVT remotely.  He has been diligently wearing his compression stockings and elevating his legs and his leg swelling is quite mild at this point.  He does have some mild stasis dermatitis changes.  No ulceration.  Not really painful.  Overall doing well.  Current Outpatient Medications  Medication Sig Dispense Refill   acetaminophen (TYLENOL) 325 MG tablet Take 650 mg by mouth every 6 (six) hours as needed (pain.).      cetirizine (ZYRTEC) 10 MG tablet Take 10 mg by mouth daily.     clindamycin (CLEOCIN T) 1 % external solution Apply 1 % topically.     tamsulosin (FLOMAX) 0.4 MG CAPS capsule Take 1 capsule (0.4 mg total) by mouth daily after breakfast. 90 capsule 3   timolol (TIMOPTIC) 0.5 % ophthalmic solution Place 1 drop into both eyes daily.      No current facility-administered medications for this visit.    Past Medical History:  Diagnosis Date   Anemia    Chronic diarrhea    DDD (degenerative disc disease), cervical    Hyperlipidemia    Hypertension    borderline   IDA (iron deficiency anemia)    Low testosterone    Vasovagal syncope     Past Surgical History:  Procedure Laterality Date   CARDIAC CATHETERIZATION     catracts Bilateral    CHOLECYSTECTOMY     COLONOSCOPY WITH PROPOFOL N/A 06/26/2016   Procedure: COLONOSCOPY WITH PROPOFOL;  Surgeon: Manya Silvas, MD;  Location: [facility] ENDOSCOPY;  Service: Endoscopy;  Laterality: N/A;   COLONOSCOPY WITH PROPOFOL N/A 07/03/2019   Procedure: COLONOSCOPY WITH PROPOFOL;  Surgeon: [name], MD;  Location: [facility] ENDOSCOPY;  Service: Gastroenterology;  Laterality: N/A;   ENDOSCOPIC RETROGRADE CHOLANGIOPANCREATOGRAPHY (ERCP) WITH PROPOFOL      ESOPHAGOGASTRODUODENOSCOPY N/A 04/19/2019   Procedure: ESOPHAGOGASTRODUODENOSCOPY (EGD);  Surgeon: [name], MD;  Location: [facility] ENDOSCOPY;  Service: Gastroenterology;  Laterality: N/A;   ESOPHAGOGASTRODUODENOSCOPY (EGD) WITH PROPOFOL N/A 07/03/2019   Procedure: ESOPHAGOGASTRODUODENOSCOPY (EGD) WITH PROPOFOL;  Surgeon: [name], MD;  Location: [facility] ENDOSCOPY;  Service: Gastroenterology;  Laterality: N/A;   ESOPHAGOGASTRODUODENOSCOPY (EGD) WITH PROPOFOL N/A 08/07/2019   Procedure: ESOPHAGOGASTRODUODENOSCOPY (EGD) WITH PROPOFOL;  Surgeon: Milus Banister, MD;  Location: [facility] ENDOSCOPY;  Service: Endoscopy;  Laterality: N/A;   EUS N/A 08/07/2019   Procedure: UPPER ENDOSCOPIC ULTRASOUND (EUS) RADIAL;  Surgeon: Milus Banister, MD;  Location: [facility] ENDOSCOPY;  Service: Endoscopy;  Laterality: N/A;   eyelid surgery     FOOT SURGERY     HEMORROIDECTOMY     HERNIA REPAIR     mrercp     RHINOPLASTY     TONSILLECTOMY     WISDOM TOOTH EXTRACTION       Social History   Tobacco Use   Smoking status: Former    Packs/day: 1.00    Years: 25.00    Pack years: 25.00    Types: Cigarettes    Quit date: 12/05/1986    Years since quitting: 33.9   Smokeless tobacco: Never  Vaping Use   Vaping Use: Never used  Substance Use Topics   Alcohol use: Yes  Alcohol/week: 14.0 standard drinks    Types: 14 Cans of beer per week    Comment: 2 beers per day/ a couple glasses of wine    Drug use: No      Family History  Problem Relation Age of Onset   Hypothyroidism Mother    Diabetes Mother    Lung cancer Mother      Allergies  Allergen Reactions   Nsaids     Can take Ibuprofen in moderation/high doses or prolonged use causes GI bleed   REVIEW OF SYSTEMS (Negative unless checked)   Constitutional: '[]'$ Weight loss  '[]'$ Fever  '[]'$ Chills Cardiac: '[]'$ Chest pain   '[]'$ Chest pressure   '[]'$ Palpitations   '[]'$ Shortness of breath when laying flat   '[]'$ Shortness of breath at rest   '[]'$ Shortness of breath with  exertion. Vascular:  '[x]'$ Pain in legs with walking   '[x]'$ Pain in legs at rest   '[]'$ Pain in legs when laying flat   '[]'$ Claudication   '[]'$ Pain in feet when walking  '[]'$ Pain in feet at rest  '[]'$ Pain in feet when laying flat   '[x]'$ History of DVT   '[]'$ Phlebitis   '[x]'$ Swelling in legs   '[]'$ Varicose veins   '[]'$ Non-healing ulcers Pulmonary:   '[]'$ Uses home oxygen   '[]'$ Productive cough   '[]'$ Hemoptysis   '[]'$ Wheeze  '[]'$ COPD   '[]'$ Asthma Neurologic:  '[]'$ Dizziness  '[]'$ Blackouts   '[]'$ Seizures   '[]'$ History of stroke   '[]'$ History of TIA  '[]'$ Aphasia   '[]'$ Temporary blindness   '[]'$ Dysphagia   '[]'$ Weakness or numbness in arms   '[]'$ Weakness or numbness in legs Musculoskeletal:  '[x]'$ Arthritis   '[]'$ Joint swelling   '[]'$ Joint pain   '[]'$ Low back pain Hematologic:  '[]'$ Easy bruising  '[]'$ Easy bleeding   '[]'$ Hypercoagulable state   '[]'$ Anemic  '[]'$ Hepatitis Gastrointestinal:  '[]'$ Blood in stool   '[]'$ Vomiting blood  '[]'$ Gastroesophageal reflux/heartburn   '[]'$ Abdominal pain Genitourinary:  '[]'$ Chronic kidney disease   '[]'$ Difficult urination  '[]'$ Frequent urination  '[]'$ Burning with urination   '[]'$ Hematuria Skin:  '[]'$ Rashes   '[]'$ Ulcers   '[]'$ Wounds Psychological:  '[]'$ History of anxiety   '[]'$  History of major depression.   Physical Examination  BP 133/83   Pulse 63   Ht '5\' 6"'$  (1.676 m)   Wt 195 lb (88.5 kg)   BMI 31.47 kg/m  Gen:  WD/WN, NAD Head: /AT, No temporalis wasting. Ear/Nose/Throat: Hearing grossly intact, nares w/o erythema or drainage Eyes: Conjunctiva clear. Sclera non-icteric Neck: Supple.  Trachea midline Pulmonary:  Good air movement, no use of accessory muscles.  Cardiac: RRR, no JVD Vascular:  Vessel Right Left  Radial Palpable Palpable                          PT Palpable Palpable  DP Palpable Palpable   Musculoskeletal: M/S 5/5 throughout.  No deformity or atrophy.  Mild stasis dermatitis changes present to both lower extremities.  Trace lower extremity edema. Neurologic: Sensation grossly intact in extremities.  Symmetrical.  Speech is fluent.   Psychiatric: Judgment intact, Mood & affect appropriate for pt's clinical situation. Dermatologic: No rashes or ulcers noted.  No cellulitis or open wounds.      Labs Recent Results (from the past 2160 hour(s))  Vitamin B12     Status: None   Collection Time: 09/03/20 12:59 PM  Result Value Ref Range   Vitamin B-12 202 180 - 914 pg/mL    Comment: (NOTE) This assay is not validated for testing neonatal or myeloproliferative syndrome specimens for Vitamin B12 levels. Performed at [facility], [place] [address], [place], [place] [address]   Iron and TIBC     Status: None   Collection Time: 09/03/20 12:59 PM  Result Value Ref Range   Iron 104 45 - 182 ug/dL   TIBC 375 250 - 450 ug/dL   Saturation Ratios 28 17.9 - 39.5 %   UIBC 271 ug/dL    Comment: Performed at [facility] [address] [place], [place] [address]  Ferritin     Status: None   Collection Time: 09/03/20 12:59 PM  Result Value Ref Range   Ferritin 30 24 - 336 ng/mL    Comment: Performed at [facility], [address] [place], [place] [address]  CBC with Differential/Platelet     Status: Abnormal   Collection Time: 09/03/20 12:59 PM  Result Value Ref Range   WBC 6.0 4.0 - 10.5 K/uL   RBC 4.19 (L) 4.22 - 5.81 MIL/uL   Hemoglobin 13.9 13.0 - 17.0 g/dL   HCT 41.3 39.0 - 52.0 %   MCV 98.6 80.0 - 100.0 fL   MCH 33.2 26.0 - 34.0 pg   MCHC 33.7 30.0 - 36.0 g/dL   RDW 13.6 11.5 - 15.5 %   Platelets 192 150 - 400 K/uL   nRBC 0.0 0.0 - 0.2 %   Neutrophils Relative % 65 %   Neutro Abs 3.9 1.7 - 7.7 K/uL   Lymphocytes Relative 22 %   Lymphs Abs 1.3 0.7 - 4.0 K/uL   Monocytes Relative 9 %   Monocytes Absolute 0.5 0.1 - 1.0 K/uL   Eosinophils Relative 4 %   Eosinophils Absolute 0.2 0.0 - 0.5 K/uL   Basophils Relative 0 %   Basophils Absolute 0.0 0.0 - 0.1 K/uL   Immature Granulocytes 0 %   Abs Immature Granulocytes 0.02 0.00 - 0.07 K/uL    Comment: Performed at [facility],  [address] [place], [place] [address]  Urinalysis, Complete     Status: Abnormal   Collection Time: 11/11/20 11:00 AM  Result Value Ref Range   Specific Gravity, UA >1.030 (H) 1.005 - 1.030   pH, UA 5.0 5.0 - 7.5   Color, UA Orange Yellow   Appearance Ur Hazy (A) Clear   Leukocytes,UA Negative Negative   Protein,UA Trace (A) Negative/Trace   Glucose, UA Negative Negative   Ketones, UA Trace (A) Negative   RBC, UA 2+ (A) Negative   Bilirubin, UA Negative Negative   Urobilinogen, Ur 0.2 0.2 - 1.0 mg/dL   Nitrite, UA Negative Negative   Microscopic Examination See below:     Comment: Results from sub-optimal specimen performed at the client's request. Interpret result(s) with caution. Microscopic exam performed on unspun urine. Reference range(s) not established for this type of specimen.   Microscopic Examination     Status: Abnormal   Collection Time: 11/11/20 11:00 AM   Urine  Result Value Ref Range   WBC, UA 0-5 0 - 5 /hpf   RBC 0-2 0 - 2 /hpf   Epithelial Cells (non renal) None seen 0 - 10 /hpf   Casts Present (A) None seen /lpf   Cast Type Hyaline casts N/A   Mucus, UA Present (A) Not Estab.   Bacteria, UA Few None seen/Few  Bladder Scan (Post Void Residual) in office     Status: None   Collection Time: 11/11/20 11:15 AM  Result Value Ref Range   Scan Result 1     Radiology No results  found.  Assessment/Plan Lumbar radiculitis Could be contributing to some of his lower extremity symptoms.   Borderline hypertension Not currently on meds. blood pressure control important in reducing the progression of atherosclerotic disease.   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  At this point, compression socks and elevation of done an excellent job controlling his symptoms and his swelling is quite mild.  Follow-up in 1 year.    Leotis Pain, MD  11/23/2020 11:57 AM    This note was created with Dragon medical  transcription system.  Any errors from dictation are purely unintentional

## 2020-11-23 NOTE — Assessment & Plan Note (Signed)
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  At this point, compression socks and elevation of done an excellent job controlling his symptoms and his swelling is quite mild.  Follow-up in 1 year.

## 2020-12-03 DIAGNOSIS — M47816 Spondylosis without myelopathy or radiculopathy, lumbar region: Secondary | ICD-10-CM | POA: Diagnosis not present

## 2020-12-06 DIAGNOSIS — E782 Mixed hyperlipidemia: Secondary | ICD-10-CM | POA: Diagnosis not present

## 2020-12-06 DIAGNOSIS — Z23 Encounter for immunization: Secondary | ICD-10-CM | POA: Diagnosis not present

## 2020-12-06 DIAGNOSIS — R55 Syncope and collapse: Secondary | ICD-10-CM | POA: Diagnosis not present

## 2020-12-06 DIAGNOSIS — I340 Nonrheumatic mitral (valve) insufficiency: Secondary | ICD-10-CM | POA: Diagnosis not present

## 2020-12-06 DIAGNOSIS — Z86718 Personal history of other venous thrombosis and embolism: Secondary | ICD-10-CM | POA: Diagnosis not present

## 2020-12-06 DIAGNOSIS — R030 Elevated blood-pressure reading, without diagnosis of hypertension: Secondary | ICD-10-CM | POA: Diagnosis not present

## 2020-12-08 ENCOUNTER — Inpatient Hospital Stay: Payer: PPO | Attending: Oncology

## 2020-12-08 DIAGNOSIS — E538 Deficiency of other specified B group vitamins: Secondary | ICD-10-CM | POA: Insufficient documentation

## 2020-12-08 DIAGNOSIS — D509 Iron deficiency anemia, unspecified: Secondary | ICD-10-CM

## 2020-12-08 MED ORDER — CYANOCOBALAMIN 1000 MCG/ML IJ SOLN
1000.0000 ug | Freq: Once | INTRAMUSCULAR | Status: AC
  Administered 2020-12-08: 1000 ug via INTRAMUSCULAR
  Filled 2020-12-08: qty 1

## 2020-12-16 DIAGNOSIS — Z79899 Other long term (current) drug therapy: Secondary | ICD-10-CM | POA: Diagnosis not present

## 2020-12-16 DIAGNOSIS — E782 Mixed hyperlipidemia: Secondary | ICD-10-CM | POA: Diagnosis not present

## 2020-12-23 DIAGNOSIS — Z8719 Personal history of other diseases of the digestive system: Secondary | ICD-10-CM | POA: Diagnosis not present

## 2020-12-23 DIAGNOSIS — Z86718 Personal history of other venous thrombosis and embolism: Secondary | ICD-10-CM | POA: Diagnosis not present

## 2020-12-23 DIAGNOSIS — I340 Nonrheumatic mitral (valve) insufficiency: Secondary | ICD-10-CM | POA: Diagnosis not present

## 2020-12-23 DIAGNOSIS — R55 Syncope and collapse: Secondary | ICD-10-CM | POA: Diagnosis not present

## 2020-12-23 DIAGNOSIS — Z9889 Other specified postprocedural states: Secondary | ICD-10-CM | POA: Diagnosis not present

## 2020-12-23 DIAGNOSIS — I4891 Unspecified atrial fibrillation: Secondary | ICD-10-CM | POA: Diagnosis not present

## 2020-12-23 DIAGNOSIS — E7800 Pure hypercholesterolemia, unspecified: Secondary | ICD-10-CM | POA: Diagnosis not present

## 2020-12-23 DIAGNOSIS — D508 Other iron deficiency anemias: Secondary | ICD-10-CM | POA: Diagnosis not present

## 2020-12-23 DIAGNOSIS — R030 Elevated blood-pressure reading, without diagnosis of hypertension: Secondary | ICD-10-CM | POA: Diagnosis not present

## 2020-12-31 NOTE — Progress Notes (Signed)
[facility]  [phone] [phone]  ID: Marcus Coleman OB: 1940-08-24  MR#: [id number]  OIN#:[id number]  Patient Care Team: Idelle Crouch, MD as PCP - General (Internal Medicine) Lloyd Huger, MD as Consulting Physician (Hematology and Oncology)  CHIEF COMPLAINT: Iron deficiency anemia.  INTERVAL HISTORY: Patient returns to clinic today for repeat laboratory work, further evaluation, and consideration of additional IV Feraheme.  He continues to have chronic weakness and fatigue, but otherwise feels well. He has no neurologic complaints.  He denies any recent fevers or illnesses.  He has a good appetite and denies weight loss.  He denies any chest pain, shortness of breath, cough, or hemoptysis.  He denies any nausea, vomiting, constipation, or diarrhea.  He has no melena or hematochezia.  He has no urinary complaints.  Patient offers no specific complaints today.  REVIEW OF SYSTEMS:   Review of Systems  Constitutional:  Positive for malaise/fatigue. Negative for fever and weight loss.  Respiratory: Negative.  Negative for cough, hemoptysis and shortness of breath.   Cardiovascular: Negative.  Negative for chest pain and leg swelling.  Gastrointestinal: Negative.  Negative for abdominal pain, blood in stool and melena.  Genitourinary: Negative.  Negative for hematuria.  Musculoskeletal: Negative.  Negative for back pain.  Skin: Negative.  Negative for rash.  Neurological:  Positive for weakness. Negative for dizziness, focal weakness and headaches.  Psychiatric/Behavioral: Negative.  The patient is not nervous/anxious.    As per HPI. Otherwise, a complete review of systems is negative.  PAST MEDICAL HISTORY: Past Medical History:  Diagnosis Date   Anemia    Chronic diarrhea    DDD (degenerative disc disease), cervical    Hyperlipidemia    Hypertension    borderline   IDA (iron deficiency anemia)    Low testosterone    Vasovagal  syncope     PAST SURGICAL HISTORY: Past Surgical History:  Procedure Laterality Date   CARDIAC CATHETERIZATION     catracts Bilateral    CHOLECYSTECTOMY     COLONOSCOPY WITH PROPOFOL N/A 06/26/2016   Procedure: COLONOSCOPY WITH PROPOFOL;  Surgeon: Manya Silvas, MD;  Location: [facility] ENDOSCOPY;  Service: Endoscopy;  Laterality: N/A;   COLONOSCOPY WITH PROPOFOL N/A 07/03/2019   Procedure: COLONOSCOPY WITH PROPOFOL;  Surgeon: [name], MD;  Location: [facility] ENDOSCOPY;  Service: Gastroenterology;  Laterality: N/A;   ENDOSCOPIC RETROGRADE CHOLANGIOPANCREATOGRAPHY (ERCP) WITH PROPOFOL     ESOPHAGOGASTRODUODENOSCOPY N/A 04/19/2019   Procedure: ESOPHAGOGASTRODUODENOSCOPY (EGD);  Surgeon: [name], MD;  Location: [facility] ENDOSCOPY;  Service: Gastroenterology;  Laterality: N/A;   ESOPHAGOGASTRODUODENOSCOPY (EGD) WITH PROPOFOL N/A 07/03/2019   Procedure: ESOPHAGOGASTRODUODENOSCOPY (EGD) WITH PROPOFOL;  Surgeon: [name], MD;  Location: [facility] ENDOSCOPY;  Service: Gastroenterology;  Laterality: N/A;   ESOPHAGOGASTRODUODENOSCOPY (EGD) WITH PROPOFOL N/A 08/07/2019   Procedure: ESOPHAGOGASTRODUODENOSCOPY (EGD) WITH PROPOFOL;  Surgeon: Milus Banister, MD;  Location: [facility] ENDOSCOPY;  Service: Endoscopy;  Laterality: N/A;   EUS N/A 08/07/2019   Procedure: UPPER ENDOSCOPIC ULTRASOUND (EUS) RADIAL;  Surgeon: Milus Banister, MD;  Location: [facility] ENDOSCOPY;  Service: Endoscopy;  Laterality: N/A;   eyelid surgery     FOOT SURGERY     HEMORROIDECTOMY     HERNIA REPAIR     mrercp     RHINOPLASTY     TONSILLECTOMY     WISDOM TOOTH EXTRACTION      FAMILY HISTORY: Family History  Problem Relation Age of Onset   Hypothyroidism Mother    Diabetes Mother  Lung cancer Mother     ADVANCED DIRECTIVES (Y/N):  N  HEALTH MAINTENANCE: Social History   Tobacco Use   Smoking status: Former    Packs/day: 1.00    Years: 25.00    Pack years: 25.00    Types: Cigarettes    Quit date: 12/05/1986    Years since  quitting: 34.1   Smokeless tobacco: Never  Vaping Use   Vaping Use: Never used  Substance Use Topics   Alcohol use: Yes    Alcohol/week: 14.0 standard drinks    Types: 14 Cans of beer per week    Comment: 2 beers per day/ a couple glasses of wine    Drug use: No     Colonoscopy:  PAP:  Bone density:  Lipid panel:  Allergies  Allergen Reactions   Nsaids     Can take Ibuprofen in moderation/high doses or prolonged use causes GI bleed    Current Outpatient Medications  Medication Sig Dispense Refill   acetaminophen (TYLENOL) 325 MG tablet Take 650 mg by mouth every 6 (six) hours as needed (pain.).      cetirizine (ZYRTEC) 10 MG tablet Take 10 mg by mouth daily.     clindamycin (CLEOCIN T) 1 % external solution Apply 1 % topically.     metoprolol succinate (TOPROL-XL) 50 MG 24 hr tablet Take 50 mg by mouth daily.     tamsulosin (FLOMAX) 0.4 MG CAPS capsule Take 1 capsule (0.4 mg total) by mouth daily after breakfast. 90 capsule 3   timolol (TIMOPTIC) 0.5 % ophthalmic solution Place 1 drop into both eyes daily.      No current facility-administered medications for this visit.    OBJECTIVE: Vitals:   01/06/21 1426  BP: (!) 150/92  Pulse: (!) 112  Resp: 20  Temp: (!) 97.2 F (36.2 C)  SpO2: 99%     Body mass index is 33.85 kg/m.    ECOG FS:0 - Asymptomatic  General: Well-developed, well-nourished, no acute distress. Eyes: Pink conjunctiva, anicteric sclera. HEENT: Normocephalic, moist mucous membranes. Lungs: No audible wheezing or coughing. Heart: Regular rate and rhythm. Abdomen: Soft, nontender, no obvious distention. Musculoskeletal: No edema, cyanosis, or clubbing. Neuro: Alert, answering all questions appropriately. Cranial nerves grossly intact. Skin: No rashes or petechiae noted. Psych: Normal affect.  LAB RESULTS:  Lab Results  Component Value Date   NA 138 04/21/2019   K 3.4 (L) 04/21/2019   CL 106 04/21/2019   CO2 26 04/21/2019   GLUCOSE 112 (H)  04/21/2019   BUN 16 04/21/2019   CREATININE 0.69 04/21/2019   CALCIUM 8.0 (L) 04/21/2019   GFRNONAA >60 04/21/2019   GFRAA >60 04/21/2019    Lab Results  Component Value Date   WBC 6.3 01/03/2021   NEUTROABS 4.2 01/03/2021   HGB 10.7 (L) 01/03/2021   HCT 33.8 (L) 01/03/2021   MCV 92.9 01/03/2021   PLT 243 01/03/2021   Lab Results  Component Value Date   IRON 25 (L) 01/03/2021   TIBC 518 (H) 01/03/2021   IRONPCTSAT 5 (L) 01/03/2021   Lab Results  Component Value Date   FERRITIN 8 (L) 01/03/2021     STUDIES: No results found.  ASSESSMENT: Iron deficiency anemia  1. Iron deficiency anemia: Patient's hemoglobin and iron stores have trended down and he is also symptomatic.  He had a normal colonoscopy on June 26, 2016.  His most recent EGD on Aug 07, 2019 did not reveal any significant pathology.  Proceed with 510 mg IV  Feraheme today.  Return to clinic in 1 to 2 weeks for second infusion.  Patient will then return to clinic in 4 months with repeat laboratory work, further evaluation, and continuation of treatment if needed.  2.  B12 deficiency: B12 levels continue to be within normal limits.  Proceed with 1000 mg IM injection today, but then hold until repeat laboratory work in 4 months.   I spent a total of 30 minutes reviewing chart data, face-to-face evaluation with the patient, counseling and coordination of care as detailed above.    Patient expressed understanding and was in agreement with this plan. He also understands that He can call clinic at any time with any questions, concerns, or complaints.    Lloyd Huger, MD   01/07/2021 9:00 AM

## 2021-01-03 ENCOUNTER — Other Ambulatory Visit: Payer: Self-pay

## 2021-01-03 ENCOUNTER — Inpatient Hospital Stay: Payer: PPO | Attending: Oncology

## 2021-01-03 DIAGNOSIS — D509 Iron deficiency anemia, unspecified: Secondary | ICD-10-CM | POA: Diagnosis not present

## 2021-01-03 DIAGNOSIS — Z79899 Other long term (current) drug therapy: Secondary | ICD-10-CM | POA: Diagnosis not present

## 2021-01-03 DIAGNOSIS — E538 Deficiency of other specified B group vitamins: Secondary | ICD-10-CM | POA: Insufficient documentation

## 2021-01-03 DIAGNOSIS — M48062 Spinal stenosis, lumbar region with neurogenic claudication: Secondary | ICD-10-CM | POA: Diagnosis not present

## 2021-01-03 DIAGNOSIS — M5136 Other intervertebral disc degeneration, lumbar region: Secondary | ICD-10-CM | POA: Diagnosis not present

## 2021-01-03 DIAGNOSIS — M47816 Spondylosis without myelopathy or radiculopathy, lumbar region: Secondary | ICD-10-CM | POA: Diagnosis not present

## 2021-01-03 DIAGNOSIS — M5416 Radiculopathy, lumbar region: Secondary | ICD-10-CM | POA: Diagnosis not present

## 2021-01-03 LAB — VITAMIN B12: Vitamin B-12: 596 pg/mL (ref 180–914)

## 2021-01-03 LAB — CBC WITH DIFFERENTIAL/PLATELET
Abs Immature Granulocytes: 0.03 10*3/uL (ref 0.00–0.07)
Basophils Absolute: 0 10*3/uL (ref 0.0–0.1)
Basophils Relative: 0 %
Eosinophils Absolute: 0.1 10*3/uL (ref 0.0–0.5)
Eosinophils Relative: 2 %
HCT: 33.8 % — ABNORMAL LOW (ref 39.0–52.0)
Hemoglobin: 10.7 g/dL — ABNORMAL LOW (ref 13.0–17.0)
Immature Granulocytes: 1 %
Lymphocytes Relative: 20 %
Lymphs Abs: 1.3 10*3/uL (ref 0.7–4.0)
MCH: 29.4 pg (ref 26.0–34.0)
MCHC: 31.7 g/dL (ref 30.0–36.0)
MCV: 92.9 fL (ref 80.0–100.0)
Monocytes Absolute: 0.7 10*3/uL (ref 0.1–1.0)
Monocytes Relative: 11 %
Neutro Abs: 4.2 10*3/uL (ref 1.7–7.7)
Neutrophils Relative %: 66 %
Platelets: 243 10*3/uL (ref 150–400)
RBC: 3.64 MIL/uL — ABNORMAL LOW (ref 4.22–5.81)
RDW: 14.3 % (ref 11.5–15.5)
WBC: 6.3 10*3/uL (ref 4.0–10.5)
nRBC: 0 % (ref 0.0–0.2)

## 2021-01-03 LAB — IRON AND TIBC
Iron: 25 ug/dL — ABNORMAL LOW (ref 45–182)
Saturation Ratios: 5 % — ABNORMAL LOW (ref 17.9–39.5)
TIBC: 518 ug/dL — ABNORMAL HIGH (ref 250–450)
UIBC: 493 ug/dL

## 2021-01-03 LAB — FERRITIN: Ferritin: 8 ng/mL — ABNORMAL LOW (ref 24–336)

## 2021-01-06 ENCOUNTER — Other Ambulatory Visit: Payer: Self-pay

## 2021-01-06 ENCOUNTER — Inpatient Hospital Stay (HOSPITAL_BASED_OUTPATIENT_CLINIC_OR_DEPARTMENT_OTHER): Payer: PPO | Admitting: Oncology

## 2021-01-06 ENCOUNTER — Encounter: Payer: Self-pay | Admitting: Oncology

## 2021-01-06 ENCOUNTER — Inpatient Hospital Stay: Payer: PPO

## 2021-01-06 VITALS — BP 150/92 | HR 112 | Temp 97.2°F | Resp 20 | Wt 209.7 lb

## 2021-01-06 DIAGNOSIS — D509 Iron deficiency anemia, unspecified: Secondary | ICD-10-CM | POA: Diagnosis not present

## 2021-01-06 MED ORDER — SODIUM CHLORIDE 0.9 % IV SOLN
Freq: Once | INTRAVENOUS | Status: AC
  Filled 2021-01-06: qty 250

## 2021-01-06 MED ORDER — CYANOCOBALAMIN 1000 MCG/ML IJ SOLN
1000.0000 ug | Freq: Once | INTRAMUSCULAR | Status: AC
  Administered 2021-01-06: 1000 ug via INTRAMUSCULAR
  Filled 2021-01-06: qty 1

## 2021-01-06 MED ORDER — SODIUM CHLORIDE 0.9 % IV SOLN
510.0000 mg | Freq: Once | INTRAVENOUS | Status: AC
  Administered 2021-01-06: 510 mg via INTRAVENOUS
  Filled 2021-01-06: qty 510

## 2021-01-06 NOTE — Patient Instructions (Signed)
Vitamin B12 Injection What is this medication? Vitamin B12 (VAHY tuh min B12) prevents and treats low vitamin B12 levels in your body. It is used in people who do not get enough vitamin B12 from their diet or when their digestive tract does not absorb enough. Vitamin B12 plays an important role in maintaining the health of your nervous system and red blood cells. This medicine may be used for other purposes; ask your health care provider or pharmacist if you have questions. COMMON BRAND NAME(S): B-12 Compliance Kit, B-12 Injection Kit, Cyomin, Dodex, LA-12, Nutri-Twelve, Physicians EZ Use B-12, Primabalt What should I tell my care team before I take this medication? They need to know if you have any of these conditions: Kidney disease Leber's disease Megaloblastic anemia An unusual or allergic reaction to cyanocobalamin, cobalt, other medications, foods, dyes, or preservatives Pregnant or trying to get pregnant Breast-feeding How should I use this medication? This medication is injected into a muscle or deeply under the skin. It is usually given in a clinic or care team's office. However, your care team may teach you how to inject yourself. Follow all instructions. Talk to your care team about the use of this medication in children. Special care may be needed. Overdosage: If you think you have taken too much of this medicine contact a poison control center or emergency room at once. NOTE: This medicine is only for you. Do not share this medicine with others. What if I miss a dose? If you are given your dose at a clinic or care team's office, call to reschedule your appointment. If you give your own injections, and you miss a dose, take it as soon as you can. If it is almost time for your next dose, take only that dose. Do not take double or extra doses. What may interact with this medication? Colchicine Heavy alcohol intake This list may not describe all possible interactions. Give your health  care provider a list of all the medicines, herbs, non-prescription drugs, or dietary supplements you use. Also tell them if you smoke, drink alcohol, or use illegal drugs. Some items may interact with your medicine. What should I watch for while using this medication? Visit your care team regularly. You may need blood work done while you are taking this medication. You may need to follow a special diet. Talk to your care team. Limit your alcohol intake and avoid smoking to get the best benefit. What side effects may I notice from receiving this medication? Side effects that you should report to your care team as soon as possible: Allergic reactions-skin rash, itching, hives, swelling of the face, lips, tongue, or throat Swelling of the ankles, hands, or feet Trouble breathing Side effects that usually do not require medical attention (report to your care team if they continue or are bothersome): Diarrhea This list may not describe all possible side effects. Call your doctor for medical advice about side effects. You may report side effects to FDA at 1-800-FDA-1088. Where should I keep my medication? Keep out of the reach of children. Store at room temperature between 15 and 30 degrees C (59 and 85 degrees F). Protect from light. Throw away any unused medication after the expiration date. NOTE: This sheet is a summary. It may not cover all possible information. If you have questions about this medicine, talk to your doctor, pharmacist, or health care provider.  2022 Elsevier/Gold Standard (2020-04-19 11:47:06) Ferumoxytol Injection What is this medication? FERUMOXYTOL (FER ue MOX i tol)  treats low levels of iron in your body (iron deficiency anemia). Iron is a mineral that plays an important role in making red blood cells, which carry oxygen from your lungs to the rest of your body. This medicine may be used for other purposes; ask your health care provider or pharmacist if you have  questions. COMMON BRAND NAME(S): Feraheme What should I tell my care team before I take this medication? They need to know if you have any of these conditions: Anemia not caused by low iron levels High levels of iron in the blood Magnetic resonance imaging (MRI) test scheduled An unusual or allergic reaction to iron, other medications, foods, dyes, or preservatives Pregnant or trying to get pregnant Breast-feeding How should I use this medication? This medication is for injection into a vein. It is given in a hospital or clinic setting. Talk to your care team the use of this medication in children. Special care may be needed. Overdosage: If you think you have taken too much of this medicine contact a poison control center or emergency room at once. NOTE: This medicine is only for you. Do not share this medicine with others. What if I miss a dose? It is important not to miss your dose. Call your care team if you are unable to keep an appointment. What may interact with this medication? Other iron products This list may not describe all possible interactions. Give your health care provider a list of all the medicines, herbs, non-prescription drugs, or dietary supplements you use. Also tell them if you smoke, drink alcohol, or use illegal drugs. Some items may interact with your medicine. What should I watch for while using this medication? Visit your care team regularly. Tell your care team if your symptoms do not start to get better or if they get worse. You may need blood work done while you are taking this medication. You may need to follow a special diet. Talk to your care team. Foods that contain iron include: whole grains/cereals, dried fruits, beans, or peas, leafy green vegetables, and organ meats (liver, kidney). What side effects may I notice from receiving this medication? Side effects that you should report to your care team as soon as possible: Allergic reactions-skin rash,  itching, hives, swelling of the face, lips, tongue, or throat Low blood pressure-dizziness, feeling faint or lightheaded, blurry vision Shortness of breath Side effects that usually do not require medical attention (report to your care team if they continue or are bothersome): Flushing Headache Joint pain Muscle pain Nausea Pain, redness, or irritation at injection site This list may not describe all possible side effects. Call your doctor for medical advice about side effects. You may report side effects to FDA at 1-800-FDA-1088. Where should I keep my medication? This medication is given in a hospital or clinic and will not be stored at home. NOTE: This sheet is a summary. It may not cover all possible information. If you have questions about this medicine, talk to your doctor, pharmacist, or health care provider.  2022 Elsevier/Gold Standard (2020-07-16 15:35:12)

## 2021-01-07 ENCOUNTER — Encounter: Payer: Self-pay | Admitting: Oncology

## 2021-01-11 DIAGNOSIS — R55 Syncope and collapse: Secondary | ICD-10-CM | POA: Diagnosis not present

## 2021-01-12 DIAGNOSIS — I340 Nonrheumatic mitral (valve) insufficiency: Secondary | ICD-10-CM | POA: Diagnosis not present

## 2021-01-12 DIAGNOSIS — E7800 Pure hypercholesterolemia, unspecified: Secondary | ICD-10-CM | POA: Diagnosis not present

## 2021-01-12 DIAGNOSIS — R030 Elevated blood-pressure reading, without diagnosis of hypertension: Secondary | ICD-10-CM | POA: Diagnosis not present

## 2021-01-12 DIAGNOSIS — R55 Syncope and collapse: Secondary | ICD-10-CM | POA: Diagnosis not present

## 2021-01-12 DIAGNOSIS — D509 Iron deficiency anemia, unspecified: Secondary | ICD-10-CM | POA: Diagnosis not present

## 2021-01-12 DIAGNOSIS — Z8719 Personal history of other diseases of the digestive system: Secondary | ICD-10-CM | POA: Diagnosis not present

## 2021-01-12 DIAGNOSIS — Z86718 Personal history of other venous thrombosis and embolism: Secondary | ICD-10-CM | POA: Diagnosis not present

## 2021-01-13 ENCOUNTER — Inpatient Hospital Stay: Payer: PPO | Attending: Oncology

## 2021-01-13 ENCOUNTER — Other Ambulatory Visit: Payer: Self-pay

## 2021-01-13 ENCOUNTER — Encounter: Payer: Self-pay | Admitting: Oncology

## 2021-01-13 VITALS — BP 113/72 | HR 99 | Temp 97.2°F | Resp 18

## 2021-01-13 DIAGNOSIS — D538 Other specified nutritional anemias: Secondary | ICD-10-CM | POA: Insufficient documentation

## 2021-01-13 DIAGNOSIS — D509 Iron deficiency anemia, unspecified: Secondary | ICD-10-CM | POA: Insufficient documentation

## 2021-01-13 DIAGNOSIS — Z79899 Other long term (current) drug therapy: Secondary | ICD-10-CM | POA: Diagnosis not present

## 2021-01-13 MED ORDER — SODIUM CHLORIDE 0.9 % IV SOLN
510.0000 mg | Freq: Once | INTRAVENOUS | Status: AC
  Administered 2021-01-13: 510 mg via INTRAVENOUS
  Filled 2021-01-13: qty 510

## 2021-01-13 MED ORDER — SODIUM CHLORIDE 0.9 % IV SOLN
Freq: Once | INTRAVENOUS | Status: AC
  Filled 2021-01-13: qty 250

## 2021-01-13 NOTE — Progress Notes (Signed)
HR 112, ok to proceed with Feraheme per [name].

## 2021-01-13 NOTE — Patient Instructions (Signed)
Ferumoxytol Injection What is this medication? FERUMOXYTOL (FER ue MOX i tol) treats low levels of iron in your body (iron deficiency anemia). Iron is a mineral that plays an important role in making red blood cells, which carry oxygen from your lungs to the rest of your body. This medicine may be used for other purposes; ask your health care provider or pharmacist if you have questions. COMMON BRAND NAME(S): Feraheme What should I tell my care team before I take this medication? They need to know if you have any of these conditions: Anemia not caused by low iron levels High levels of iron in the blood Magnetic resonance imaging (MRI) test scheduled An unusual or allergic reaction to iron, other medications, foods, dyes, or preservatives Pregnant or trying to get pregnant Breast-feeding How should I use this medication? This medication is for injection into a vein. It is given in a hospital or clinic setting. Talk to your care team the use of this medication in children. Special care may be needed. Overdosage: If you think you have taken too much of this medicine contact a poison control center or emergency room at once. NOTE: This medicine is only for you. Do not share this medicine with others. What if I miss a dose? It is important not to miss your dose. Call your care team if you are unable to keep an appointment. What may interact with this medication? Other iron products This list may not describe all possible interactions. Give your health care provider a list of all the medicines, herbs, non-prescription drugs, or dietary supplements you use. Also tell them if you smoke, drink alcohol, or use illegal drugs. Some items may interact with your medicine. What should I watch for while using this medication? Visit your care team regularly. Tell your care team if your symptoms do not start to get better or if they get worse. You may need blood work done while you are taking this  medication. You may need to follow a special diet. Talk to your care team. Foods that contain iron include: whole grains/cereals, dried fruits, beans, or peas, leafy green vegetables, and organ meats (liver, kidney). What side effects may I notice from receiving this medication? Side effects that you should report to your care team as soon as possible: Allergic reactions-skin rash, itching, hives, swelling of the face, lips, tongue, or throat Low blood pressure-dizziness, feeling faint or lightheaded, blurry vision Shortness of breath Side effects that usually do not require medical attention (report to your care team if they continue or are bothersome): Flushing Headache Joint pain Muscle pain Nausea Pain, redness, or irritation at injection site This list may not describe all possible side effects. Call your doctor for medical advice about side effects. You may report side effects to FDA at 1-800-FDA-1088. Where should I keep my medication? This medication is given in a hospital or clinic and will not be stored at home. NOTE: This sheet is a summary. It may not cover all possible information. If you have questions about this medicine, talk to your doctor, pharmacist, or health care provider.  2022 Elsevier/Gold Standard (2020-07-16 15:35:12)  

## 2021-01-20 ENCOUNTER — Other Ambulatory Visit: Payer: Self-pay

## 2021-01-20 ENCOUNTER — Inpatient Hospital Stay: Payer: PPO

## 2021-01-20 VITALS — BP 132/94 | HR 108 | Temp 96.0°F | Resp 17

## 2021-01-20 DIAGNOSIS — D509 Iron deficiency anemia, unspecified: Secondary | ICD-10-CM

## 2021-01-20 MED ORDER — SODIUM CHLORIDE 0.9 % IV SOLN
510.0000 mg | Freq: Once | INTRAVENOUS | Status: AC
  Administered 2021-01-20: 510 mg via INTRAVENOUS
  Filled 2021-01-20: qty 510

## 2021-01-20 MED ORDER — SODIUM CHLORIDE 0.9 % IV SOLN
Freq: Once | INTRAVENOUS | Status: AC
  Filled 2021-01-20: qty 250

## 2021-01-20 NOTE — Progress Notes (Signed)
1319: B/P 148/96 HR 128 and irregular.  1328: B/P 143/100 anf HR 68 and irregular.  1330: HR 160 Pt denies any s/s such as SOB or chest pains. Pt states that rarely he will have a short lasting "twinge in my chest"  pt states this is not new and that he has discussed this with MD.   [name] aware, per [name] proceed with Shirlean Kelly, pt to contact PCP or cardiologist today (01/20/21). Pt aware and verbalizes understanding.  1435: Pt tolerated infusion well. Pt continues to deny any concerns or symptoms. Pt reeducated to call PCP or cardiologist and pt verbalizes understanding. Pt stable at discharge.

## 2021-01-20 NOTE — Patient Instructions (Signed)

## 2021-01-21 DIAGNOSIS — R600 Localized edema: Secondary | ICD-10-CM | POA: Diagnosis not present

## 2021-01-21 DIAGNOSIS — R030 Elevated blood-pressure reading, without diagnosis of hypertension: Secondary | ICD-10-CM | POA: Diagnosis not present

## 2021-01-21 DIAGNOSIS — D508 Other iron deficiency anemias: Secondary | ICD-10-CM | POA: Diagnosis not present

## 2021-01-21 DIAGNOSIS — I482 Chronic atrial fibrillation, unspecified: Secondary | ICD-10-CM | POA: Diagnosis not present

## 2021-01-28 DIAGNOSIS — R030 Elevated blood-pressure reading, without diagnosis of hypertension: Secondary | ICD-10-CM | POA: Diagnosis not present

## 2021-01-28 DIAGNOSIS — I482 Chronic atrial fibrillation, unspecified: Secondary | ICD-10-CM | POA: Diagnosis not present

## 2021-01-28 DIAGNOSIS — D508 Other iron deficiency anemias: Secondary | ICD-10-CM | POA: Diagnosis not present

## 2021-01-28 DIAGNOSIS — R600 Localized edema: Secondary | ICD-10-CM | POA: Diagnosis not present

## 2021-02-07 DIAGNOSIS — L821 Other seborrheic keratosis: Secondary | ICD-10-CM | POA: Diagnosis not present

## 2021-02-07 DIAGNOSIS — D2272 Melanocytic nevi of left lower limb, including hip: Secondary | ICD-10-CM | POA: Diagnosis not present

## 2021-02-07 DIAGNOSIS — X32XXXA Exposure to sunlight, initial encounter: Secondary | ICD-10-CM | POA: Diagnosis not present

## 2021-02-07 DIAGNOSIS — D2261 Melanocytic nevi of right upper limb, including shoulder: Secondary | ICD-10-CM | POA: Diagnosis not present

## 2021-02-07 DIAGNOSIS — D2271 Melanocytic nevi of right lower limb, including hip: Secondary | ICD-10-CM | POA: Diagnosis not present

## 2021-02-07 DIAGNOSIS — D2262 Melanocytic nevi of left upper limb, including shoulder: Secondary | ICD-10-CM | POA: Diagnosis not present

## 2021-02-07 DIAGNOSIS — L570 Actinic keratosis: Secondary | ICD-10-CM | POA: Diagnosis not present

## 2021-02-07 DIAGNOSIS — D225 Melanocytic nevi of trunk: Secondary | ICD-10-CM | POA: Diagnosis not present

## 2021-03-11 DIAGNOSIS — J40 Bronchitis, not specified as acute or chronic: Secondary | ICD-10-CM | POA: Diagnosis not present

## 2021-03-11 DIAGNOSIS — R030 Elevated blood-pressure reading, without diagnosis of hypertension: Secondary | ICD-10-CM | POA: Diagnosis not present

## 2021-03-11 DIAGNOSIS — I482 Chronic atrial fibrillation, unspecified: Secondary | ICD-10-CM | POA: Diagnosis not present

## 2021-03-11 DIAGNOSIS — R600 Localized edema: Secondary | ICD-10-CM | POA: Diagnosis not present

## 2021-03-31 DIAGNOSIS — Z0000 Encounter for general adult medical examination without abnormal findings: Secondary | ICD-10-CM | POA: Diagnosis not present

## 2021-03-31 DIAGNOSIS — R030 Elevated blood-pressure reading, without diagnosis of hypertension: Secondary | ICD-10-CM | POA: Diagnosis not present

## 2021-03-31 DIAGNOSIS — D508 Other iron deficiency anemias: Secondary | ICD-10-CM | POA: Diagnosis not present

## 2021-03-31 DIAGNOSIS — I482 Chronic atrial fibrillation, unspecified: Secondary | ICD-10-CM | POA: Diagnosis not present

## 2021-03-31 DIAGNOSIS — Z79899 Other long term (current) drug therapy: Secondary | ICD-10-CM | POA: Diagnosis not present

## 2021-03-31 DIAGNOSIS — E7800 Pure hypercholesterolemia, unspecified: Secondary | ICD-10-CM | POA: Diagnosis not present

## 2021-04-20 ENCOUNTER — Other Ambulatory Visit: Payer: Self-pay | Admitting: *Deleted

## 2021-04-20 DIAGNOSIS — D509 Iron deficiency anemia, unspecified: Secondary | ICD-10-CM

## 2021-04-27 ENCOUNTER — Inpatient Hospital Stay: Payer: PPO | Attending: Nurse Practitioner

## 2021-04-27 ENCOUNTER — Other Ambulatory Visit: Payer: Self-pay

## 2021-04-27 DIAGNOSIS — E538 Deficiency of other specified B group vitamins: Secondary | ICD-10-CM | POA: Diagnosis not present

## 2021-04-27 DIAGNOSIS — D509 Iron deficiency anemia, unspecified: Secondary | ICD-10-CM | POA: Insufficient documentation

## 2021-04-27 DIAGNOSIS — Z79899 Other long term (current) drug therapy: Secondary | ICD-10-CM | POA: Diagnosis not present

## 2021-04-27 LAB — CBC WITH DIFFERENTIAL/PLATELET
Abs Immature Granulocytes: 0.01 10*3/uL (ref 0.00–0.07)
Basophils Absolute: 0 10*3/uL (ref 0.0–0.1)
Basophils Relative: 0 %
Eosinophils Absolute: 0.1 10*3/uL (ref 0.0–0.5)
Eosinophils Relative: 3 %
HCT: 36.5 % — ABNORMAL LOW (ref 39.0–52.0)
Hemoglobin: 11.4 g/dL — ABNORMAL LOW (ref 13.0–17.0)
Immature Granulocytes: 0 %
Lymphocytes Relative: 22 %
Lymphs Abs: 1.1 10*3/uL (ref 0.7–4.0)
MCH: 30.2 pg (ref 26.0–34.0)
MCHC: 31.2 g/dL (ref 30.0–36.0)
MCV: 96.6 fL (ref 80.0–100.0)
Monocytes Absolute: 0.6 10*3/uL (ref 0.1–1.0)
Monocytes Relative: 13 %
Neutro Abs: 3.2 10*3/uL (ref 1.7–7.7)
Neutrophils Relative %: 62 %
Platelets: 190 10*3/uL (ref 150–400)
RBC: 3.78 MIL/uL — ABNORMAL LOW (ref 4.22–5.81)
RDW: 15.6 % — ABNORMAL HIGH (ref 11.5–15.5)
WBC: 5.1 10*3/uL (ref 4.0–10.5)
nRBC: 0 % (ref 0.0–0.2)

## 2021-04-27 LAB — IRON AND TIBC
Iron: 50 ug/dL (ref 45–182)
Saturation Ratios: 10 % — ABNORMAL LOW (ref 17.9–39.5)
TIBC: 491 ug/dL — ABNORMAL HIGH (ref 250–450)
UIBC: 441 ug/dL

## 2021-04-27 LAB — VITAMIN B12: Vitamin B-12: 306 pg/mL (ref 180–914)

## 2021-04-27 LAB — FERRITIN: Ferritin: 15 ng/mL — ABNORMAL LOW (ref 24–336)

## 2021-04-28 ENCOUNTER — Inpatient Hospital Stay: Payer: PPO

## 2021-04-28 ENCOUNTER — Encounter: Payer: Self-pay | Admitting: Nurse Practitioner

## 2021-04-28 ENCOUNTER — Inpatient Hospital Stay (HOSPITAL_BASED_OUTPATIENT_CLINIC_OR_DEPARTMENT_OTHER): Payer: PPO | Admitting: Nurse Practitioner

## 2021-04-28 VITALS — BP 119/80 | HR 84 | Resp 16

## 2021-04-28 VITALS — BP 137/98 | HR 118 | Temp 98.3°F | Resp 18 | Wt 201.5 lb

## 2021-04-28 DIAGNOSIS — D509 Iron deficiency anemia, unspecified: Secondary | ICD-10-CM

## 2021-04-28 DIAGNOSIS — E538 Deficiency of other specified B group vitamins: Secondary | ICD-10-CM | POA: Diagnosis not present

## 2021-04-28 MED ORDER — CYANOCOBALAMIN 1000 MCG/ML IJ SOLN
1000.0000 ug | Freq: Once | INTRAMUSCULAR | Status: AC
  Administered 2021-04-28: 1000 ug via INTRAMUSCULAR
  Filled 2021-04-28: qty 1

## 2021-04-28 MED ORDER — SODIUM CHLORIDE 0.9 % IV SOLN
510.0000 mg | Freq: Once | INTRAVENOUS | Status: AC
  Administered 2021-04-28: 510 mg via INTRAVENOUS
  Filled 2021-04-28: qty 510

## 2021-04-28 MED ORDER — SODIUM CHLORIDE 0.9 % IV SOLN
Freq: Once | INTRAVENOUS | Status: AC
  Filled 2021-04-28: qty 250

## 2021-04-28 NOTE — Progress Notes (Signed)
[facility]  Telephone:([phone]) [phone] Fax:(336) [phone]  ID: Marcus Coleman OB: [id number]  MR#: [id number]  IWL#:[id number]  Patient Care Team: Idelle Crouch, MD as PCP - General (Internal Medicine) Lloyd Huger, MD as Consulting Physician (Hematology and Oncology)  CHIEF COMPLAINT: Iron deficiency anemia.  INTERVAL HISTORY: Patient returns to clinic today for repeat laboratory work, further evaluation, and consideration of additional IV Feraheme.  He continues to have chronic weakness and fatigue, but otherwise feels well. He has no neurologic complaints.  He denies any recent fevers or illnesses.  He has a good appetite and denies weight loss.  He denies any chest pain, shortness of breath, cough, or hemoptysis.  He denies any nausea, vomiting, constipation, or diarrhea.  He has no melena or hematochezia.  He has no urinary complaints.  Patient offers no specific complaints today.  REVIEW OF SYSTEMS:   Review of Systems  Constitutional:  Positive for malaise/fatigue. Negative for fever and weight loss.  Respiratory: Negative.  Negative for cough, hemoptysis and shortness of breath.   Cardiovascular: Negative.  Negative for chest pain and leg swelling.  Gastrointestinal: Negative.  Negative for abdominal pain, blood in stool and melena.  Genitourinary: Negative.  Negative for hematuria.  Musculoskeletal: Negative.  Negative for back pain.  Skin: Negative.  Negative for rash.  Neurological:  Positive for weakness. Negative for dizziness, focal weakness and headaches.  Psychiatric/Behavioral: Negative.  The patient is not nervous/anxious.    As per HPI. Otherwise, a complete review of systems is negative.  PAST MEDICAL HISTORY: Past Medical History:  Diagnosis Date   Anemia    Atrial fibrillation (HCC)    Chronic diarrhea    DDD (degenerative disc disease), cervical    Hyperlipidemia    Hypertension    borderline   IDA (iron deficiency anemia)     Low testosterone    Vasovagal syncope     PAST SURGICAL HISTORY: Past Surgical History:  Procedure Laterality Date   CARDIAC CATHETERIZATION     catracts Bilateral    CHOLECYSTECTOMY     COLONOSCOPY WITH PROPOFOL N/A 06/26/2016   Procedure: COLONOSCOPY WITH PROPOFOL;  Surgeon: Manya Silvas, MD;  Location: [facility] ENDOSCOPY;  Service: Endoscopy;  Laterality: N/A;   COLONOSCOPY WITH PROPOFOL N/A 07/03/2019   Procedure: COLONOSCOPY WITH PROPOFOL;  Surgeon: [name], MD;  Location: [facility] ENDOSCOPY;  Service: Gastroenterology;  Laterality: N/A;   ENDOSCOPIC RETROGRADE CHOLANGIOPANCREATOGRAPHY (ERCP) WITH PROPOFOL     ESOPHAGOGASTRODUODENOSCOPY N/A 04/19/2019   Procedure: ESOPHAGOGASTRODUODENOSCOPY (EGD);  Surgeon: [name], MD;  Location: [facility] ENDOSCOPY;  Service: Gastroenterology;  Laterality: N/A;   ESOPHAGOGASTRODUODENOSCOPY (EGD) WITH PROPOFOL N/A 07/03/2019   Procedure: ESOPHAGOGASTRODUODENOSCOPY (EGD) WITH PROPOFOL;  Surgeon: [name], MD;  Location: [facility] ENDOSCOPY;  Service: Gastroenterology;  Laterality: N/A;   ESOPHAGOGASTRODUODENOSCOPY (EGD) WITH PROPOFOL N/A 08/07/2019   Procedure: ESOPHAGOGASTRODUODENOSCOPY (EGD) WITH PROPOFOL;  Surgeon: Milus Banister, MD;  Location: [facility] ENDOSCOPY;  Service: Endoscopy;  Laterality: N/A;   EUS N/A 08/07/2019   Procedure: UPPER ENDOSCOPIC ULTRASOUND (EUS) RADIAL;  Surgeon: Milus Banister, MD;  Location: [facility] ENDOSCOPY;  Service: Endoscopy;  Laterality: N/A;   eyelid surgery     FOOT SURGERY     HEMORROIDECTOMY     HERNIA REPAIR     mrercp     RHINOPLASTY     TONSILLECTOMY     WISDOM TOOTH EXTRACTION      FAMILY HISTORY: Family History  Problem Relation Age of Onset   Hypothyroidism Mother  Diabetes Mother    Lung cancer Mother     ADVANCED DIRECTIVES (Y/N):  N  HEALTH MAINTENANCE: Social History   Tobacco Use   Smoking status: Former    Packs/day: 1.00    Years: 25.00    Pack years: 25.00    Types: Cigarettes    Quit  date: 12/05/1986    Years since quitting: 34.4   Smokeless tobacco: Never  Vaping Use   Vaping Use: Never used  Substance Use Topics   Alcohol use: Yes    Alcohol/week: 14.0 standard drinks    Types: 14 Cans of beer per week    Comment: 2 beers per day/ a couple glasses of wine    Drug use: No    Colonoscopy:  PAP:  Bone density:  Lipid panel:  Allergies  Allergen Reactions   Nsaids     Can take Ibuprofen in moderation/high doses or prolonged use causes GI bleed    Current Outpatient Medications  Medication Sig Dispense Refill   acetaminophen (TYLENOL) 325 MG tablet Take 650 mg by mouth every 6 (six) hours as needed (pain.).      cetirizine (ZYRTEC) 10 MG tablet Take 10 mg by mouth daily.     clindamycin (CLEOCIN T) 1 % external solution Apply 1 % topically.     furosemide (LASIX) 20 MG tablet Take 20 mg by mouth 2 (two) times daily.     metoprolol succinate (TOPROL-XL) 50 MG 24 hr tablet Take 50 mg by mouth daily.     timolol (TIMOPTIC) 0.5 % ophthalmic solution Place 1 drop into both eyes daily.      tamsulosin (FLOMAX) 0.4 MG CAPS capsule Take 1 capsule (0.4 mg total) by mouth daily after breakfast. (Patient not taking: Reported on 04/28/2021) 90 capsule 3   No current facility-administered medications for this visit.    OBJECTIVE: Vitals:   04/28/21 1300  BP: (!) 137/98  Pulse: (!) 118  Resp: 18  Temp: 98.3 F (36.8 C)  SpO2: 98%     Body mass index is 32.52 kg/m.    ECOG FS:0 - Asymptomatic  General: Well-developed, well-nourished, no acute distress. Eyes: Pink conjunctiva, anicteric sclera. Lungs: Clear to auscultation bilaterally.  No audible wheezing or coughing Heart: Regular rate and rhythm.  Abdomen: Soft, nontender, nondistended.  Musculoskeletal: No edema, cyanosis, or clubbing. Neuro: Alert, answering all questions appropriately. Cranial nerves grossly intact. Skin: No rashes or petechiae noted. Psych: Normal affect.  LAB RESULTS:  Lab Results   Component Value Date   NA 138 04/21/2019   K 3.4 (L) 04/21/2019   CL 106 04/21/2019   CO2 26 04/21/2019   GLUCOSE 112 (H) 04/21/2019   BUN 16 04/21/2019   CREATININE 0.69 04/21/2019   CALCIUM 8.0 (L) 04/21/2019   GFRNONAA >60 04/21/2019   GFRAA >60 04/21/2019    Lab Results  Component Value Date   WBC 5.1 04/27/2021   NEUTROABS 3.2 04/27/2021   HGB 11.4 (L) 04/27/2021   HCT 36.5 (L) 04/27/2021   MCV 96.6 04/27/2021   PLT 190 04/27/2021   Lab Results  Component Value Date   IRON 50 04/27/2021   TIBC 491 (H) 04/27/2021   IRONPCTSAT 10 (L) 04/27/2021   Lab Results  Component Value Date   FERRITIN 15 (L) 04/27/2021     STUDIES: No results found.  ASSESSMENT: Iron deficiency anemia  1. Iron deficiency anemia: Patient's hemoglobin and iron stores have trended down and he is also symptomatic.  He had a normal  colonoscopy on June 26, 2016.  His most recent EGD on Aug 07, 2019 did not reveal any significant pathology. Previously had history of gastric ulcers. Given decreased ferritin of 15, proceed with feraheme 510 mg x 2. RTC in 3 months for repeat labs then day to week later see [name], +/- feraheme.  I've asked him to reach out to [name] to discuss if he needs repeat evaluation or capsule study.  2.  B12 deficiency: B12 levels normal but < goal of 500. Proceed with monthly b12 injections.   Patient expressed understanding and was in agreement with this plan. He also understands that He can call clinic at any time with any questions, concerns, or complaints.   Verlon Au, NP   04/28/2021

## 2021-04-28 NOTE — Progress Notes (Signed)
HR in clinic visit 118, manual HR currently 80 and irregular - history of a.fib. per pt, ok to proceed with feraheme per L.Zenia Resides NP

## 2021-04-28 NOTE — Patient Instructions (Signed)

## 2021-04-28 NOTE — Progress Notes (Signed)
Patient denies new problems/concerns today.    Blood pressure 137/98 on office check and he states bp usually elevated on MD office checks.

## 2021-05-05 ENCOUNTER — Inpatient Hospital Stay: Payer: PPO

## 2021-05-05 ENCOUNTER — Other Ambulatory Visit: Payer: Self-pay

## 2021-05-05 VITALS — BP 124/85 | HR 115

## 2021-05-05 DIAGNOSIS — D509 Iron deficiency anemia, unspecified: Secondary | ICD-10-CM | POA: Diagnosis not present

## 2021-05-05 MED ORDER — SODIUM CHLORIDE 0.9 % IV SOLN
510.0000 mg | Freq: Once | INTRAVENOUS | Status: AC
  Administered 2021-05-05: 510 mg via INTRAVENOUS
  Filled 2021-05-05: qty 510

## 2021-05-05 MED ORDER — SODIUM CHLORIDE 0.9 % IV SOLN
Freq: Once | INTRAVENOUS | Status: AC
  Filled 2021-05-05: qty 250

## 2021-05-05 NOTE — Patient Instructions (Signed)
[facility]CER [facility] AT [place]  Discharge Instructions: Thank you for choosing [facility] to provide your oncology and hematology care.  If you have a lab appointment with the [facility], please go directly to the [facility] and check in at the registration area.  Wear comfortable clothing and clothing appropriate for easy access to any Portacath or PICC line.   We strive to give you quality time with your provider. You may need to reschedule your appointment if you arrive late (15 or more minutes).  Arriving late affects you and other patients whose appointments are after yours.  Also, if you miss three or more appointments without notifying the office, you may be dismissed from the clinic at the providers discretion.      For prescription refill requests, have your pharmacy contact our office and allow 72 hours for refills to be completed.    Today you received the following : Feraheme     To help prevent nausea and vomiting after your treatment, we encourage you to take your nausea medication as directed.  BELOW ARE SYMPTOMS THAT SHOULD BE REPORTED IMMEDIATELY: *FEVER GREATER THAN 100.4 F (38 C) OR HIGHER *CHILLS OR SWEATING *NAUSEA AND VOMITING THAT IS NOT CONTROLLED WITH YOUR NAUSEA MEDICATION *UNUSUAL SHORTNESS OF BREATH *UNUSUAL BRUISING OR BLEEDING *URINARY PROBLEMS (pain or burning when urinating, or frequent urination) *BOWEL PROBLEMS (unusual diarrhea, constipation, pain near the anus) TENDERNESS IN MOUTH AND THROAT WITH OR WITHOUT PRESENCE OF ULCERS (sore throat, sores in mouth, or a toothache) UNUSUAL RASH, SWELLING OR PAIN  UNUSUAL VAGINAL DISCHARGE OR ITCHING   Items with * indicate a potential emergency and should be followed up as soon as possible or go to the Emergency Department if any problems should occur.  Please show the CHEMOTHERAPY ALERT CARD or IMMUNOTHERAPY ALERT CARD at check-in to the Emergency Department and triage  nurse.  Should you have questions after your visit or need to cancel or reschedule your appointment, please contact [facility] CANCER [place] AT [place]  [phone] and follow the prompts.  Office hours are 8:00 a.m. to 4:30 p.m. Monday - Friday. Please note that voicemails left after 4:00 p.m. may not be returned until the following business day.  We are closed weekends and major holidays. You have access to a nurse at all times for urgent questions. Please call the main number to the clinic [phone] and follow the prompts.  For any non-urgent questions, you may also contact your provider using MyChart. We now offer e-Visits for anyone 53 and older to request care online for non-urgent symptoms. For details visit [email or web address].   Also download the MyChart app! Go to the app store, search "MyChart", open the app, select [facility], and log in with your MyChart username and password.  Due to Covid, a mask is required upon entering the hospital/clinic. If you do not have a mask, one will be given to you upon arrival. For doctor visits, patients may have 1 support person aged 27 or older with them. For treatment visits, patients cannot have anyone with them due to current Covid guidelines and our immunocompromised population.

## 2021-05-11 DIAGNOSIS — M5432 Sciatica, left side: Secondary | ICD-10-CM | POA: Diagnosis not present

## 2021-05-11 DIAGNOSIS — M9903 Segmental and somatic dysfunction of lumbar region: Secondary | ICD-10-CM | POA: Diagnosis not present

## 2021-05-11 DIAGNOSIS — M9905 Segmental and somatic dysfunction of pelvic region: Secondary | ICD-10-CM | POA: Diagnosis not present

## 2021-05-11 DIAGNOSIS — M955 Acquired deformity of pelvis: Secondary | ICD-10-CM | POA: Diagnosis not present

## 2021-05-17 DIAGNOSIS — M9903 Segmental and somatic dysfunction of lumbar region: Secondary | ICD-10-CM | POA: Diagnosis not present

## 2021-05-17 DIAGNOSIS — M5432 Sciatica, left side: Secondary | ICD-10-CM | POA: Diagnosis not present

## 2021-05-17 DIAGNOSIS — M955 Acquired deformity of pelvis: Secondary | ICD-10-CM | POA: Diagnosis not present

## 2021-05-17 DIAGNOSIS — M9905 Segmental and somatic dysfunction of pelvic region: Secondary | ICD-10-CM | POA: Diagnosis not present

## 2021-05-23 DIAGNOSIS — M9903 Segmental and somatic dysfunction of lumbar region: Secondary | ICD-10-CM | POA: Diagnosis not present

## 2021-05-23 DIAGNOSIS — M955 Acquired deformity of pelvis: Secondary | ICD-10-CM | POA: Diagnosis not present

## 2021-05-23 DIAGNOSIS — M5432 Sciatica, left side: Secondary | ICD-10-CM | POA: Diagnosis not present

## 2021-05-23 DIAGNOSIS — M9905 Segmental and somatic dysfunction of pelvic region: Secondary | ICD-10-CM | POA: Diagnosis not present

## 2021-05-26 ENCOUNTER — Inpatient Hospital Stay: Payer: PPO | Attending: Oncology

## 2021-05-26 ENCOUNTER — Other Ambulatory Visit: Payer: Self-pay

## 2021-05-26 DIAGNOSIS — Z79899 Other long term (current) drug therapy: Secondary | ICD-10-CM | POA: Diagnosis not present

## 2021-05-26 DIAGNOSIS — D509 Iron deficiency anemia, unspecified: Secondary | ICD-10-CM | POA: Diagnosis not present

## 2021-05-26 DIAGNOSIS — E538 Deficiency of other specified B group vitamins: Secondary | ICD-10-CM | POA: Insufficient documentation

## 2021-05-26 MED ORDER — CYANOCOBALAMIN 1000 MCG/ML IJ SOLN
1000.0000 ug | Freq: Once | INTRAMUSCULAR | Status: AC
  Administered 2021-05-26: 1000 ug via INTRAMUSCULAR
  Filled 2021-05-26: qty 1

## 2021-05-30 DIAGNOSIS — H40002 Preglaucoma, unspecified, left eye: Secondary | ICD-10-CM | POA: Diagnosis not present

## 2021-06-01 DIAGNOSIS — Z86718 Personal history of other venous thrombosis and embolism: Secondary | ICD-10-CM | POA: Diagnosis not present

## 2021-06-01 DIAGNOSIS — E7800 Pure hypercholesterolemia, unspecified: Secondary | ICD-10-CM | POA: Diagnosis not present

## 2021-06-01 DIAGNOSIS — R55 Syncope and collapse: Secondary | ICD-10-CM | POA: Diagnosis not present

## 2021-06-01 DIAGNOSIS — I4819 Other persistent atrial fibrillation: Secondary | ICD-10-CM | POA: Insufficient documentation

## 2021-06-01 DIAGNOSIS — I340 Nonrheumatic mitral (valve) insufficiency: Secondary | ICD-10-CM | POA: Diagnosis not present

## 2021-06-01 DIAGNOSIS — I480 Paroxysmal atrial fibrillation: Secondary | ICD-10-CM | POA: Diagnosis not present

## 2021-06-01 DIAGNOSIS — R030 Elevated blood-pressure reading, without diagnosis of hypertension: Secondary | ICD-10-CM | POA: Diagnosis not present

## 2021-06-06 DIAGNOSIS — M955 Acquired deformity of pelvis: Secondary | ICD-10-CM | POA: Diagnosis not present

## 2021-06-06 DIAGNOSIS — M9903 Segmental and somatic dysfunction of lumbar region: Secondary | ICD-10-CM | POA: Diagnosis not present

## 2021-06-06 DIAGNOSIS — M9905 Segmental and somatic dysfunction of pelvic region: Secondary | ICD-10-CM | POA: Diagnosis not present

## 2021-06-06 DIAGNOSIS — M5432 Sciatica, left side: Secondary | ICD-10-CM | POA: Diagnosis not present

## 2021-06-27 ENCOUNTER — Inpatient Hospital Stay: Payer: PPO | Attending: Nurse Practitioner

## 2021-06-27 DIAGNOSIS — D509 Iron deficiency anemia, unspecified: Secondary | ICD-10-CM | POA: Diagnosis not present

## 2021-06-27 DIAGNOSIS — E538 Deficiency of other specified B group vitamins: Secondary | ICD-10-CM | POA: Diagnosis not present

## 2021-06-27 MED ORDER — CYANOCOBALAMIN 1000 MCG/ML IJ SOLN
1000.0000 ug | Freq: Once | INTRAMUSCULAR | Status: AC
  Administered 2021-06-27: 1000 ug via INTRAMUSCULAR
  Filled 2021-06-27: qty 1

## 2021-07-04 DIAGNOSIS — M9903 Segmental and somatic dysfunction of lumbar region: Secondary | ICD-10-CM | POA: Diagnosis not present

## 2021-07-04 DIAGNOSIS — M955 Acquired deformity of pelvis: Secondary | ICD-10-CM | POA: Diagnosis not present

## 2021-07-04 DIAGNOSIS — M9905 Segmental and somatic dysfunction of pelvic region: Secondary | ICD-10-CM | POA: Diagnosis not present

## 2021-07-04 DIAGNOSIS — M5432 Sciatica, left side: Secondary | ICD-10-CM | POA: Diagnosis not present

## 2021-07-19 DIAGNOSIS — Z0000 Encounter for general adult medical examination without abnormal findings: Secondary | ICD-10-CM | POA: Diagnosis not present

## 2021-07-19 DIAGNOSIS — E7800 Pure hypercholesterolemia, unspecified: Secondary | ICD-10-CM | POA: Diagnosis not present

## 2021-07-19 DIAGNOSIS — R7309 Other abnormal glucose: Secondary | ICD-10-CM | POA: Diagnosis not present

## 2021-07-19 DIAGNOSIS — Z79899 Other long term (current) drug therapy: Secondary | ICD-10-CM | POA: Diagnosis not present

## 2021-07-19 DIAGNOSIS — M5416 Radiculopathy, lumbar region: Secondary | ICD-10-CM | POA: Diagnosis not present

## 2021-07-19 DIAGNOSIS — R030 Elevated blood-pressure reading, without diagnosis of hypertension: Secondary | ICD-10-CM | POA: Diagnosis not present

## 2021-07-19 DIAGNOSIS — I4819 Other persistent atrial fibrillation: Secondary | ICD-10-CM | POA: Diagnosis not present

## 2021-07-19 DIAGNOSIS — D500 Iron deficiency anemia secondary to blood loss (chronic): Secondary | ICD-10-CM | POA: Diagnosis not present

## 2021-07-19 DIAGNOSIS — M25512 Pain in left shoulder: Secondary | ICD-10-CM | POA: Diagnosis not present

## 2021-07-19 DIAGNOSIS — Z125 Encounter for screening for malignant neoplasm of prostate: Secondary | ICD-10-CM | POA: Diagnosis not present

## 2021-07-24 NOTE — Progress Notes (Signed)
[place]  [phone] [phone]) [phone]  ID: Marcus Coleman OB: Dec 19, 1940  MR#: [id number]  CLE#:[id number]  Patient Care Team: Idelle Crouch, MD as PCP - General (Internal Medicine) Lloyd Huger, MD as Consulting Physician (Hematology and Oncology)  CHIEF COMPLAINT: Iron deficiency anemia.  INTERVAL HISTORY: Patient returns to clinic today for repeat laboratory work, further evaluation, and consideration of additional IV Feraheme.  He has chronic weakness and fatigue, but otherwise feels well. He has no neurologic complaints.  He denies any recent fevers or illnesses.  He has a good appetite and denies weight loss.  He denies any chest pain, shortness of breath, cough, or hemoptysis.  He denies any nausea, vomiting, constipation, or diarrhea.  He has no melena or hematochezia.  He has no urinary complaints.  Patient offers no further specific complaints today.  REVIEW OF SYSTEMS:   Review of Systems  Constitutional:  Positive for malaise/fatigue. Negative for fever and weight loss.  Respiratory: Negative.  Negative for cough, hemoptysis and shortness of breath.   Cardiovascular: Negative.  Negative for chest pain and leg swelling.  Gastrointestinal: Negative.  Negative for abdominal pain, blood in stool and melena.  Genitourinary: Negative.  Negative for hematuria.  Musculoskeletal: Negative.  Negative for back pain.  Skin: Negative.  Negative for rash.  Neurological:  Positive for weakness. Negative for dizziness, focal weakness and headaches.  Psychiatric/Behavioral: Negative.  The patient is not nervous/anxious.    As per HPI. Otherwise, a complete review of systems is negative.  PAST MEDICAL HISTORY: Past Medical History:  Diagnosis Date   Anemia    Atrial fibrillation (HCC)    Chronic diarrhea    DDD (degenerative disc disease), cervical    Hyperlipidemia    Hypertension    borderline   IDA (iron deficiency anemia)    Low  testosterone    Vasovagal syncope     PAST SURGICAL HISTORY: Past Surgical History:  Procedure Laterality Date   CARDIAC CATHETERIZATION     catracts Bilateral    CHOLECYSTECTOMY     COLONOSCOPY WITH PROPOFOL N/A 06/26/2016   Procedure: COLONOSCOPY WITH PROPOFOL;  Surgeon: Manya Silvas, MD;  Location: [facility] ENDOSCOPY;  Service: Endoscopy;  Laterality: N/A;   COLONOSCOPY WITH PROPOFOL N/A 07/03/2019   Procedure: COLONOSCOPY WITH PROPOFOL;  Surgeon: [name], MD;  Location: [facility] ENDOSCOPY;  Service: Gastroenterology;  Laterality: N/A;   ENDOSCOPIC RETROGRADE CHOLANGIOPANCREATOGRAPHY (ERCP) WITH PROPOFOL     ESOPHAGOGASTRODUODENOSCOPY N/A 04/19/2019   Procedure: ESOPHAGOGASTRODUODENOSCOPY (EGD);  Surgeon: [name], MD;  Location: [facility] ENDOSCOPY;  Service: Gastroenterology;  Laterality: N/A;   ESOPHAGOGASTRODUODENOSCOPY (EGD) WITH PROPOFOL N/A 07/03/2019   Procedure: ESOPHAGOGASTRODUODENOSCOPY (EGD) WITH PROPOFOL;  Surgeon: [name], MD;  Location: [facility] ENDOSCOPY;  Service: Gastroenterology;  Laterality: N/A;   ESOPHAGOGASTRODUODENOSCOPY (EGD) WITH PROPOFOL N/A 08/07/2019   Procedure: ESOPHAGOGASTRODUODENOSCOPY (EGD) WITH PROPOFOL;  Surgeon: Milus Banister, MD;  Location: [facility] ENDOSCOPY;  Service: Endoscopy;  Laterality: N/A;   EUS N/A 08/07/2019   Procedure: UPPER ENDOSCOPIC ULTRASOUND (EUS) RADIAL;  Surgeon: Milus Banister, MD;  Location: [facility] ENDOSCOPY;  Service: Endoscopy;  Laterality: N/A;   eyelid surgery     FOOT SURGERY     HEMORROIDECTOMY     HERNIA REPAIR     mrercp     RHINOPLASTY     TONSILLECTOMY     WISDOM TOOTH EXTRACTION      FAMILY HISTORY: Family History  Problem Relation Age of Onset   Hypothyroidism Mother  Diabetes Mother    Lung cancer Mother     ADVANCED DIRECTIVES (Y/N):  N  HEALTH MAINTENANCE: Social History   Tobacco Use   Smoking status: Former    Packs/day: 1.00    Years: 25.00    Pack years: 25.00    Types: Cigarettes    Quit date:  12/05/1986    Years since quitting: 34.6   Smokeless tobacco: Never  Vaping Use   Vaping Use: Never used  Substance Use Topics   Alcohol use: Yes    Alcohol/week: 14.0 standard drinks    Types: 14 Cans of beer per week    Comment: 2 beers per day/ a couple glasses of wine    Drug use: No     Colonoscopy:  PAP:  Bone density:  Lipid panel:  Allergies  Allergen Reactions   Nsaids     Can take Ibuprofen in moderation/high doses or prolonged use causes GI bleed    Current Outpatient Medications  Medication Sig Dispense Refill   acetaminophen (TYLENOL) 325 MG tablet Take 650 mg by mouth every 6 (six) hours as needed (pain.).      cetirizine (ZYRTEC) 10 MG tablet Take 10 mg by mouth daily.     clindamycin (CLEOCIN T) 1 % external solution Apply 1 % topically.     diltiazem (CARDIZEM CD) 180 MG 24 hr capsule Take 180 mg by mouth daily.     furosemide (LASIX) 20 MG tablet Take 20 mg by mouth 2 (two) times daily.     metoprolol succinate (TOPROL-XL) 50 MG 24 hr tablet Take 50 mg by mouth daily.     timolol (TIMOPTIC) 0.5 % ophthalmic solution Place 1 drop into both eyes daily.      tamsulosin (FLOMAX) 0.4 MG CAPS capsule Take 1 capsule (0.4 mg total) by mouth daily after breakfast. (Patient not taking: Reported on 04/28/2021) 90 capsule 3   No current facility-administered medications for this visit.    OBJECTIVE: Vitals:   07/26/21 1450  BP: 106/66  Pulse: 89  Resp: 16  Temp: 97.8 F (36.6 C)  SpO2: 97%     Body mass index is 30.51 kg/m.    ECOG FS:0 - Asymptomatic  General: Well-developed, well-nourished, no acute distress. Eyes: Pink conjunctiva, anicteric sclera. HEENT: Normocephalic, moist mucous membranes. Lungs: No audible wheezing or coughing. Heart: Regular rate and rhythm. Abdomen: Soft, nontender, no obvious distention. Musculoskeletal: No edema, cyanosis, or clubbing. Neuro: Alert, answering all questions appropriately. Cranial nerves grossly intact. Skin:  No rashes or petechiae noted. Psych: Normal affect.   LAB RESULTS:  Lab Results  Component Value Date   NA 138 04/21/2019   K 3.4 (L) 04/21/2019   CL 106 04/21/2019   CO2 26 04/21/2019   GLUCOSE 112 (H) 04/21/2019   BUN 16 04/21/2019   CREATININE 0.69 04/21/2019   CALCIUM 8.0 (L) 04/21/2019   GFRNONAA >60 04/21/2019   GFRAA >60 04/21/2019    Lab Results  Component Value Date   WBC 6.0 07/25/2021   NEUTROABS 3.7 07/25/2021   HGB 12.6 (L) 07/25/2021   HCT 39.1 07/25/2021   MCV 100.3 (H) 07/25/2021   PLT 246 07/25/2021   Lab Results  Component Value Date   IRON 40 (L) 07/25/2021   TIBC 442 07/25/2021   IRONPCTSAT 9 (L) 07/25/2021   Lab Results  Component Value Date   FERRITIN 18 (L) 07/25/2021     STUDIES: No results found.  ASSESSMENT: Iron deficiency anemia  1. Iron deficiency anemia:  Patient's hemoglobin is mildly decreased at 12.6, although he continues to have persistently decreased iron stores.  He is also mildly symptomatic. He had a normal colonoscopy on June 26, 2016.  His most recent EGD on Aug 07, 2019 did not reveal any significant pathology.  Proceed with 510 mg IV Feraheme today.  Return to clinic next week for his second infusion.  Patient will then return to clinic in 3 months with repeat laboratory work, further evaluation, and continuation of treatment if needed.    2.  B12 deficiency: B12 levels continue to be within normal limits.  Proceed with 1000 mg IM injection today.  I spent a total of 30 minutes reviewing chart data, face-to-face evaluation with the patient, counseling and coordination of care as detailed above.    Patient expressed understanding and was in agreement with this plan. He also understands that He can call clinic at any time with any questions, concerns, or complaints.    Lloyd Huger, MD   07/29/2021 9:32 AM

## 2021-07-25 ENCOUNTER — Inpatient Hospital Stay: Payer: PPO | Attending: Oncology

## 2021-07-25 DIAGNOSIS — D509 Iron deficiency anemia, unspecified: Secondary | ICD-10-CM | POA: Diagnosis not present

## 2021-07-25 DIAGNOSIS — Z87891 Personal history of nicotine dependence: Secondary | ICD-10-CM | POA: Diagnosis not present

## 2021-07-25 DIAGNOSIS — E538 Deficiency of other specified B group vitamins: Secondary | ICD-10-CM | POA: Diagnosis not present

## 2021-07-25 DIAGNOSIS — Z79899 Other long term (current) drug therapy: Secondary | ICD-10-CM | POA: Insufficient documentation

## 2021-07-25 LAB — CBC WITH DIFFERENTIAL/PLATELET
Abs Immature Granulocytes: 0.08 10*3/uL — ABNORMAL HIGH (ref 0.00–0.07)
Basophils Absolute: 0 10*3/uL (ref 0.0–0.1)
Basophils Relative: 1 %
Eosinophils Absolute: 0.2 10*3/uL (ref 0.0–0.5)
Eosinophils Relative: 4 %
HCT: 39.1 % (ref 39.0–52.0)
Hemoglobin: 12.6 g/dL — ABNORMAL LOW (ref 13.0–17.0)
Immature Granulocytes: 1 %
Lymphocytes Relative: 20 %
Lymphs Abs: 1.2 10*3/uL (ref 0.7–4.0)
MCH: 32.3 pg (ref 26.0–34.0)
MCHC: 32.2 g/dL (ref 30.0–36.0)
MCV: 100.3 fL — ABNORMAL HIGH (ref 80.0–100.0)
Monocytes Absolute: 0.7 10*3/uL (ref 0.1–1.0)
Monocytes Relative: 12 %
Neutro Abs: 3.7 10*3/uL (ref 1.7–7.7)
Neutrophils Relative %: 62 %
Platelets: 246 10*3/uL (ref 150–400)
RBC: 3.9 MIL/uL — ABNORMAL LOW (ref 4.22–5.81)
RDW: 13.6 % (ref 11.5–15.5)
WBC: 6 10*3/uL (ref 4.0–10.5)
nRBC: 0 % (ref 0.0–0.2)

## 2021-07-25 LAB — IRON AND TIBC
Iron: 40 ug/dL — ABNORMAL LOW (ref 45–182)
Saturation Ratios: 9 % — ABNORMAL LOW (ref 17.9–39.5)
TIBC: 442 ug/dL (ref 250–450)
UIBC: 402 ug/dL

## 2021-07-25 LAB — FERRITIN: Ferritin: 18 ng/mL — ABNORMAL LOW (ref 24–336)

## 2021-07-25 MED FILL — Ferumoxytol Inj 510 MG/17ML (30 MG/ML) (Elemental Fe): INTRAVENOUS | Qty: 17 | Status: AC

## 2021-07-26 ENCOUNTER — Inpatient Hospital Stay: Payer: PPO

## 2021-07-26 ENCOUNTER — Encounter: Payer: Self-pay | Admitting: Oncology

## 2021-07-26 ENCOUNTER — Inpatient Hospital Stay (HOSPITAL_BASED_OUTPATIENT_CLINIC_OR_DEPARTMENT_OTHER): Payer: PPO | Admitting: Oncology

## 2021-07-26 VITALS — BP 106/66 | HR 89 | Temp 97.8°F | Resp 16 | Ht 66.0 in | Wt 189.0 lb

## 2021-07-26 VITALS — BP 126/78 | HR 81 | Resp 18

## 2021-07-26 DIAGNOSIS — D509 Iron deficiency anemia, unspecified: Secondary | ICD-10-CM

## 2021-07-26 MED ORDER — SODIUM CHLORIDE 0.9 % IV SOLN
Freq: Once | INTRAVENOUS | Status: AC
  Filled 2021-07-26: qty 250

## 2021-07-26 MED ORDER — CYANOCOBALAMIN 1000 MCG/ML IJ SOLN
1000.0000 ug | Freq: Once | INTRAMUSCULAR | Status: AC
  Administered 2021-07-26: 1000 ug via INTRAMUSCULAR
  Filled 2021-07-26: qty 1

## 2021-07-26 MED ORDER — SODIUM CHLORIDE 0.9 % IV SOLN
510.0000 mg | Freq: Once | INTRAVENOUS | Status: AC
  Administered 2021-07-26: 510 mg via INTRAVENOUS
  Filled 2021-07-26: qty 17

## 2021-07-26 NOTE — Patient Instructions (Addendum)
?Ferumoxytol Injection ?What is this medication? ?FERUMOXYTOL (FER ue MOX i tol) treats low levels of iron in your body (iron deficiency anemia). Iron is a mineral that plays an important role in making red blood cells, which carry oxygen from your lungs to the rest of your body. ?This medicine may be used for other purposes; ask your health care provider or pharmacist if you have questions. ?COMMON BRAND NAME(S): Feraheme ?What should I tell my care team before I take this medication? ?They need to know if you have any of these conditions: ?Anemia not caused by low iron levels ?High levels of iron in the blood ?Magnetic resonance imaging (MRI) test scheduled ?An unusual or allergic reaction to iron, other medications, foods, dyes, or preservatives ?Pregnant or trying to get pregnant ?Breast-feeding ?How should I use this medication? ?This medication is for injection into a vein. It is given in a hospital or clinic setting. ?Talk to your care team the use of this medication in children. Special care may be needed. ?Overdosage: If you think you have taken too much of this medicine contact a poison control center or emergency room at once. ?NOTE: This medicine is only for you. Do not share this medicine with others. ?What if I miss a dose? ?It is important not to miss your dose. Call your care team if you are unable to keep an appointment. ?What may interact with this medication? ?Other iron products ?This list may not describe all possible interactions. Give your health care provider a list of all the medicines, herbs, non-prescription drugs, or dietary supplements you use. Also tell them if you smoke, drink alcohol, or use illegal drugs. Some items may interact with your medicine. ?What should I watch for while using this medication? ?Visit your care team regularly. Tell your care team if your symptoms do not start to get better or if they get worse. You may need blood work done while you are taking this  medication. ?You may need to follow a special diet. Talk to your care team. Foods that contain iron include: whole grains/cereals, dried fruits, beans, or peas, leafy green vegetables, and organ meats (liver, kidney). ?What side effects may I notice from receiving this medication? ?Side effects that you should report to your care team as soon as possible: ?Allergic reactions--skin rash, itching, hives, swelling of the face, lips, tongue, or throat ?Low blood pressure--dizziness, feeling faint or lightheaded, blurry vision ?Shortness of breath ?Side effects that usually do not require medical attention (report to your care team if they continue or are bothersome): ?Flushing ?Headache ?Joint pain ?Muscle pain ?Nausea ?Pain, redness, or irritation at injection site ?This list may not describe all possible side effects. Call your doctor for medical advice about side effects. You may report side effects to FDA at 1-800-FDA-1088. ?Where should I keep my medication? ?This medication is given in a hospital or clinic and will not be stored at home. ?NOTE: This sheet is a summary. It may not cover all possible information. If you have questions about this medicine, talk to your doctor, pharmacist, or health care provider. ?? 2023 Elsevier/Gold Standard (2020-07-23 00:00:00) ? ? ?Vitamin B12 Injection ?What is this medication? ?Vitamin B12 (VAHY tuh min B12) prevents and treats low vitamin B12 levels in your body. It is used in people who do not get enough vitamin B12 from their diet or when their digestive tract does not absorb enough. Vitamin B12 plays an important role in maintaining the health of your nervous system  and red blood cells. ?This medicine may be used for other purposes; ask your health care provider or pharmacist if you have questions. ?COMMON BRAND NAME(S): B-12 Compliance Kit, B-12 Injection Kit, Cyomin, Dodex, LA-12, Nutri-Twelve, Physicians EZ Use B-12, Primabalt ?What should I tell my care team before I  take this medication? ?They need to know if you have any of these conditions: ?Kidney disease ?Leber's disease ?Megaloblastic anemia ?An unusual or allergic reaction to cyanocobalamin, cobalt, other medications, foods, dyes, or preservatives ?Pregnant or trying to get pregnant ?Breast-feeding ?How should I use this medication? ?This medication is injected into a muscle or deeply under the skin. It is usually given in a clinic or care team's office. However, your care team may teach you how to inject yourself. Follow all instructions. ?Talk to your care team about the use of this medication in children. Special care may be needed. ?Overdosage: If you think you have taken too much of this medicine contact a poison control center or emergency room at once. ?NOTE: This medicine is only for you. Do not share this medicine with others. ?What if I miss a dose? ?If you are given your dose at a clinic or care team's office, call to reschedule your appointment. If you give your own injections, and you miss a dose, take it as soon as you can. If it is almost time for your next dose, take only that dose. Do not take double or extra doses. ?What may interact with this medication? ?Alcohol ?Colchicine ?This list may not describe all possible interactions. Give your health care provider a list of all the medicines, herbs, non-prescription drugs, or dietary supplements you use. Also tell them if you smoke, drink alcohol, or use illegal drugs. Some items may interact with your medicine. ?What should I watch for while using this medication? ?Visit your care team regularly. You may need blood work done while you are taking this medication. ?You may need to follow a special diet. Talk to your care team. Limit your alcohol intake and avoid smoking to get the best benefit. ?What side effects may I notice from receiving this medication? ?Side effects that you should report to your care team as soon as possible: ?Allergic reactions--skin  rash, itching, hives, swelling of the face, lips, tongue, or throat ?Swelling of the ankles, hands, or feet ?Trouble breathing ?Side effects that usually do not require medical attention (report to your care team if they continue or are bothersome): ?Diarrhea ?This list may not describe all possible side effects. Call your doctor for medical advice about side effects. You may report side effects to FDA at 1-800-FDA-1088. ?Where should I keep my medication? ?Keep out of the reach of children. ?Store at room temperature between 15 and 30 degrees C (59 and 85 degrees F). Protect from light. Throw away any unused medication after the expiration date. ?NOTE: This sheet is a summary. It may not cover all possible information. If you have questions about this medicine, talk to your doctor, pharmacist, or health care provider. ?? 2023 Elsevier/Gold Standard (2020-11-09 00:00:00) ? ?

## 2021-07-26 NOTE — Progress Notes (Signed)
Patient tolerated feraheme infusion well with no problems or concerns.  Vitamin B12 injection administered to right arm, site benign.  Pt left infusion suite stable and ambulatory.  ?

## 2021-07-28 LAB — VITAMIN B1: Vitamin B1 (Thiamine): 136.2 nmol/L (ref 66.5–200.0)

## 2021-07-29 ENCOUNTER — Encounter: Payer: Self-pay | Admitting: Oncology

## 2021-08-01 DIAGNOSIS — M5432 Sciatica, left side: Secondary | ICD-10-CM | POA: Diagnosis not present

## 2021-08-01 DIAGNOSIS — M9903 Segmental and somatic dysfunction of lumbar region: Secondary | ICD-10-CM | POA: Diagnosis not present

## 2021-08-01 DIAGNOSIS — M955 Acquired deformity of pelvis: Secondary | ICD-10-CM | POA: Diagnosis not present

## 2021-08-01 DIAGNOSIS — M9905 Segmental and somatic dysfunction of pelvic region: Secondary | ICD-10-CM | POA: Diagnosis not present

## 2021-08-02 ENCOUNTER — Inpatient Hospital Stay: Payer: PPO

## 2021-08-02 VITALS — BP 106/73 | HR 99 | Temp 96.9°F | Resp 17

## 2021-08-02 DIAGNOSIS — M25512 Pain in left shoulder: Secondary | ICD-10-CM | POA: Diagnosis not present

## 2021-08-02 DIAGNOSIS — M12812 Other specific arthropathies, not elsewhere classified, left shoulder: Secondary | ICD-10-CM | POA: Diagnosis not present

## 2021-08-02 DIAGNOSIS — D509 Iron deficiency anemia, unspecified: Secondary | ICD-10-CM

## 2021-08-02 MED ORDER — SODIUM CHLORIDE 0.9 % IV SOLN
Freq: Once | INTRAVENOUS | Status: AC
  Filled 2021-08-02: qty 250

## 2021-08-02 MED ORDER — SODIUM CHLORIDE 0.9 % IV SOLN
510.0000 mg | Freq: Once | INTRAVENOUS | Status: AC
  Administered 2021-08-02: 510 mg via INTRAVENOUS
  Filled 2021-08-02: qty 510

## 2021-08-02 NOTE — Patient Instructions (Signed)

## 2021-08-11 DIAGNOSIS — M12811 Other specific arthropathies, not elsewhere classified, right shoulder: Secondary | ICD-10-CM | POA: Diagnosis not present

## 2021-08-18 DIAGNOSIS — M12811 Other specific arthropathies, not elsewhere classified, right shoulder: Secondary | ICD-10-CM | POA: Diagnosis not present

## 2021-08-22 DIAGNOSIS — M9905 Segmental and somatic dysfunction of pelvic region: Secondary | ICD-10-CM | POA: Diagnosis not present

## 2021-08-22 DIAGNOSIS — M955 Acquired deformity of pelvis: Secondary | ICD-10-CM | POA: Diagnosis not present

## 2021-08-22 DIAGNOSIS — M9903 Segmental and somatic dysfunction of lumbar region: Secondary | ICD-10-CM | POA: Diagnosis not present

## 2021-08-22 DIAGNOSIS — M5432 Sciatica, left side: Secondary | ICD-10-CM | POA: Diagnosis not present

## 2021-08-24 DIAGNOSIS — M12811 Other specific arthropathies, not elsewhere classified, right shoulder: Secondary | ICD-10-CM | POA: Diagnosis not present

## 2021-09-01 DIAGNOSIS — I4819 Other persistent atrial fibrillation: Secondary | ICD-10-CM | POA: Diagnosis not present

## 2021-09-01 DIAGNOSIS — M47812 Spondylosis without myelopathy or radiculopathy, cervical region: Secondary | ICD-10-CM | POA: Diagnosis not present

## 2021-09-01 DIAGNOSIS — M47816 Spondylosis without myelopathy or radiculopathy, lumbar region: Secondary | ICD-10-CM | POA: Diagnosis not present

## 2021-09-01 DIAGNOSIS — R55 Syncope and collapse: Secondary | ICD-10-CM | POA: Diagnosis not present

## 2021-09-01 DIAGNOSIS — I340 Nonrheumatic mitral (valve) insufficiency: Secondary | ICD-10-CM | POA: Diagnosis not present

## 2021-09-01 DIAGNOSIS — Z86718 Personal history of other venous thrombosis and embolism: Secondary | ICD-10-CM | POA: Diagnosis not present

## 2021-09-01 DIAGNOSIS — M5030 Other cervical disc degeneration, unspecified cervical region: Secondary | ICD-10-CM | POA: Diagnosis not present

## 2021-09-01 DIAGNOSIS — E7800 Pure hypercholesterolemia, unspecified: Secondary | ICD-10-CM | POA: Diagnosis not present

## 2021-09-07 DIAGNOSIS — M12811 Other specific arthropathies, not elsewhere classified, right shoulder: Secondary | ICD-10-CM | POA: Diagnosis not present

## 2021-09-12 DIAGNOSIS — M9905 Segmental and somatic dysfunction of pelvic region: Secondary | ICD-10-CM | POA: Diagnosis not present

## 2021-09-12 DIAGNOSIS — M955 Acquired deformity of pelvis: Secondary | ICD-10-CM | POA: Diagnosis not present

## 2021-09-12 DIAGNOSIS — M9903 Segmental and somatic dysfunction of lumbar region: Secondary | ICD-10-CM | POA: Diagnosis not present

## 2021-09-12 DIAGNOSIS — M5432 Sciatica, left side: Secondary | ICD-10-CM | POA: Diagnosis not present

## 2021-09-16 DIAGNOSIS — M12811 Other specific arthropathies, not elsewhere classified, right shoulder: Secondary | ICD-10-CM | POA: Diagnosis not present

## 2021-09-23 DIAGNOSIS — M12811 Other specific arthropathies, not elsewhere classified, right shoulder: Secondary | ICD-10-CM | POA: Diagnosis not present

## 2021-09-28 DIAGNOSIS — M12812 Other specific arthropathies, not elsewhere classified, left shoulder: Secondary | ICD-10-CM | POA: Diagnosis not present

## 2021-10-03 DIAGNOSIS — M9903 Segmental and somatic dysfunction of lumbar region: Secondary | ICD-10-CM | POA: Diagnosis not present

## 2021-10-03 DIAGNOSIS — M955 Acquired deformity of pelvis: Secondary | ICD-10-CM | POA: Diagnosis not present

## 2021-10-03 DIAGNOSIS — M5432 Sciatica, left side: Secondary | ICD-10-CM | POA: Diagnosis not present

## 2021-10-03 DIAGNOSIS — M9905 Segmental and somatic dysfunction of pelvic region: Secondary | ICD-10-CM | POA: Diagnosis not present

## 2021-10-07 DIAGNOSIS — M12811 Other specific arthropathies, not elsewhere classified, right shoulder: Secondary | ICD-10-CM | POA: Diagnosis not present

## 2021-10-12 DIAGNOSIS — M12811 Other specific arthropathies, not elsewhere classified, right shoulder: Secondary | ICD-10-CM | POA: Diagnosis not present

## 2021-10-19 DIAGNOSIS — M12811 Other specific arthropathies, not elsewhere classified, right shoulder: Secondary | ICD-10-CM | POA: Diagnosis not present

## 2021-10-24 DIAGNOSIS — M5432 Sciatica, left side: Secondary | ICD-10-CM | POA: Diagnosis not present

## 2021-10-24 DIAGNOSIS — M9905 Segmental and somatic dysfunction of pelvic region: Secondary | ICD-10-CM | POA: Diagnosis not present

## 2021-10-24 DIAGNOSIS — M955 Acquired deformity of pelvis: Secondary | ICD-10-CM | POA: Diagnosis not present

## 2021-10-24 DIAGNOSIS — M9903 Segmental and somatic dysfunction of lumbar region: Secondary | ICD-10-CM | POA: Diagnosis not present

## 2021-10-26 ENCOUNTER — Inpatient Hospital Stay: Payer: PPO | Attending: Oncology

## 2021-10-26 DIAGNOSIS — D509 Iron deficiency anemia, unspecified: Secondary | ICD-10-CM | POA: Diagnosis not present

## 2021-10-26 DIAGNOSIS — Z79899 Other long term (current) drug therapy: Secondary | ICD-10-CM | POA: Insufficient documentation

## 2021-10-26 DIAGNOSIS — E538 Deficiency of other specified B group vitamins: Secondary | ICD-10-CM | POA: Diagnosis not present

## 2021-10-26 LAB — CBC WITH DIFFERENTIAL/PLATELET
Abs Immature Granulocytes: 0.02 10*3/uL (ref 0.00–0.07)
Basophils Absolute: 0 10*3/uL (ref 0.0–0.1)
Basophils Relative: 1 %
Eosinophils Absolute: 0.1 10*3/uL (ref 0.0–0.5)
Eosinophils Relative: 2 %
HCT: 35.9 % — ABNORMAL LOW (ref 39.0–52.0)
Hemoglobin: 11.5 g/dL — ABNORMAL LOW (ref 13.0–17.0)
Immature Granulocytes: 0 %
Lymphocytes Relative: 21 %
Lymphs Abs: 1.1 10*3/uL (ref 0.7–4.0)
MCH: 31.3 pg (ref 26.0–34.0)
MCHC: 32 g/dL (ref 30.0–36.0)
MCV: 97.6 fL (ref 80.0–100.0)
Monocytes Absolute: 0.7 10*3/uL (ref 0.1–1.0)
Monocytes Relative: 13 %
Neutro Abs: 3.6 10*3/uL (ref 1.7–7.7)
Neutrophils Relative %: 63 %
Platelets: 223 10*3/uL (ref 150–400)
RBC: 3.68 MIL/uL — ABNORMAL LOW (ref 4.22–5.81)
RDW: 13.8 % (ref 11.5–15.5)
WBC: 5.6 10*3/uL (ref 4.0–10.5)
nRBC: 0 % (ref 0.0–0.2)

## 2021-10-26 LAB — IRON AND TIBC
Iron: 30 ug/dL — ABNORMAL LOW (ref 45–182)
Saturation Ratios: 6 % — ABNORMAL LOW (ref 17.9–39.5)
TIBC: 494 ug/dL — ABNORMAL HIGH (ref 250–450)
UIBC: 464 ug/dL

## 2021-10-26 LAB — VITAMIN B12: Vitamin B-12: 362 pg/mL (ref 180–914)

## 2021-10-26 LAB — FERRITIN: Ferritin: 13 ng/mL — ABNORMAL LOW (ref 24–336)

## 2021-10-27 ENCOUNTER — Inpatient Hospital Stay (HOSPITAL_BASED_OUTPATIENT_CLINIC_OR_DEPARTMENT_OTHER): Payer: PPO | Admitting: Medical Oncology

## 2021-10-27 ENCOUNTER — Encounter: Payer: Self-pay | Admitting: Medical Oncology

## 2021-10-27 ENCOUNTER — Inpatient Hospital Stay: Payer: PPO

## 2021-10-27 VITALS — BP 139/80 | HR 54 | Temp 98.7°F | Resp 20 | Wt 193.1 lb

## 2021-10-27 VITALS — BP 121/80 | HR 60

## 2021-10-27 DIAGNOSIS — D509 Iron deficiency anemia, unspecified: Secondary | ICD-10-CM | POA: Diagnosis not present

## 2021-10-27 MED ORDER — SODIUM CHLORIDE 0.9% FLUSH
10.0000 mL | Freq: Once | INTRAVENOUS | Status: AC | PRN
  Administered 2021-10-27: 10 mL
  Filled 2021-10-27: qty 10

## 2021-10-27 MED ORDER — SODIUM CHLORIDE 0.9 % IV SOLN
510.0000 mg | Freq: Once | INTRAVENOUS | Status: AC
  Administered 2021-10-27: 510 mg via INTRAVENOUS
  Filled 2021-10-27: qty 510

## 2021-10-27 MED ORDER — SODIUM CHLORIDE 0.9 % IV SOLN
Freq: Once | INTRAVENOUS | Status: AC
  Filled 2021-10-27: qty 250

## 2021-10-27 NOTE — Progress Notes (Signed)
[name]  Telephone:(336) [phone] Fax:(336) [phone]  ID: Marcus Coleman OB: Oct 26, 1940  MR#: [id number]  UMP#:[id number]  Patient Care Team: Idelle Crouch, MD as PCP - General (Internal Medicine) Lloyd Huger, MD as Consulting Physician (Hematology and Oncology)  CHIEF COMPLAINT: Iron deficiency anemia.  INTERVAL HISTORY: Patient returns to clinic today for repeat laboratory work, further evaluation, and consideration of additional IV Feraheme. He has tolerated this well in the past. Feeling less fatigue after treatments. He reports that over the past few weeks he has felt tired. Recently diagnosed with A. Fib; also has a history of mitral regurgitation. Working with cardiology regarding risk/benefit of medications such as his blood thinner which caused GI bleed. He is now not on any anticoagulation medication.  He has no neurologic complaints.  He denies any recent fevers or illnesses.  He has a good appetite and denies weight loss.  He denies any chest pain, shortness of breath, cough, or hemoptysis.  He denies any nausea, vomiting, constipation, or diarrhea.  He has no melena or hematochezia.  He has no urinary complaints.  Patient offers no further specific complaints today.  REVIEW OF SYSTEMS:   Review of Systems  Constitutional:  Positive for malaise/fatigue. Negative for fever and weight loss.  Respiratory: Negative.  Negative for cough, hemoptysis and shortness of breath.   Cardiovascular: Negative.  Negative for chest pain and leg swelling.  Gastrointestinal: Negative.  Negative for abdominal pain, blood in stool and melena.  Genitourinary: Negative.  Negative for hematuria.  Musculoskeletal: Negative.  Negative for back pain.  Skin: Negative.  Negative for rash.  Neurological:  Positive for weakness. Negative for dizziness, focal weakness and headaches.  Psychiatric/Behavioral: Negative.  The patient is not nervous/anxious.     As per HPI.  Otherwise, a complete review of systems is negative.  PAST MEDICAL HISTORY: Past Medical History:  Diagnosis Date   Anemia    Atrial fibrillation (HCC)    Chronic diarrhea    DDD (degenerative disc disease), cervical    Hyperlipidemia    Hypertension    borderline   IDA (iron deficiency anemia)    Low testosterone    Vasovagal syncope     PAST SURGICAL HISTORY: Past Surgical History:  Procedure Laterality Date   CARDIAC CATHETERIZATION     catracts Bilateral    CHOLECYSTECTOMY     COLONOSCOPY WITH PROPOFOL N/A 06/26/2016   Procedure: COLONOSCOPY WITH PROPOFOL;  Surgeon: Manya Silvas, MD;  Location: [facility] ENDOSCOPY;  Service: Endoscopy;  Laterality: N/A;   COLONOSCOPY WITH PROPOFOL N/A 07/03/2019   Procedure: COLONOSCOPY WITH PROPOFOL;  Surgeon: [name], MD;  Location: [facility] ENDOSCOPY;  Service: Gastroenterology;  Laterality: N/A;   ENDOSCOPIC RETROGRADE CHOLANGIOPANCREATOGRAPHY (ERCP) WITH PROPOFOL     ESOPHAGOGASTRODUODENOSCOPY N/A 04/19/2019   Procedure: ESOPHAGOGASTRODUODENOSCOPY (EGD);  Surgeon: [name], MD;  Location: [facility] ENDOSCOPY;  Service: Gastroenterology;  Laterality: N/A;   ESOPHAGOGASTRODUODENOSCOPY (EGD) WITH PROPOFOL N/A 07/03/2019   Procedure: ESOPHAGOGASTRODUODENOSCOPY (EGD) WITH PROPOFOL;  Surgeon: [name], MD;  Location: [facility] ENDOSCOPY;  Service: Gastroenterology;  Laterality: N/A;   ESOPHAGOGASTRODUODENOSCOPY (EGD) WITH PROPOFOL N/A 08/07/2019   Procedure: ESOPHAGOGASTRODUODENOSCOPY (EGD) WITH PROPOFOL;  Surgeon: Milus Banister, MD;  Location: [facility] ENDOSCOPY;  Service: Endoscopy;  Laterality: N/A;   EUS N/A 08/07/2019   Procedure: UPPER ENDOSCOPIC ULTRASOUND (EUS) RADIAL;  Surgeon: Milus Banister, MD;  Location: [facility] ENDOSCOPY;  Service: Endoscopy;  Laterality: N/A;   eyelid surgery     FOOT SURGERY  HEMORROIDECTOMY     HERNIA REPAIR     mrercp     RHINOPLASTY     TONSILLECTOMY     WISDOM TOOTH EXTRACTION      FAMILY HISTORY: Family History   Problem Relation Age of Onset   Hypothyroidism Mother    Diabetes Mother    Lung cancer Mother     ADVANCED DIRECTIVES (Y/N):  N  HEALTH MAINTENANCE: Social History   Tobacco Use   Smoking status: Former    Packs/day: 1.00    Years: 25.00    Total pack years: 25.00    Types: Cigarettes    Quit date: 12/05/1986    Years since quitting: 34.9   Smokeless tobacco: Never  Vaping Use   Vaping Use: Never used  Substance Use Topics   Alcohol use: Yes    Alcohol/week: 14.0 standard drinks of alcohol    Types: 14 Cans of beer per week    Comment: 2 beers per day/ a couple glasses of wine    Drug use: No     Colonoscopy:  PAP:  Bone density:  Lipid panel:  Allergies  Allergen Reactions   Nsaids     Can take Ibuprofen in moderation/high doses or prolonged use causes GI bleed    Current Outpatient Medications  Medication Sig Dispense Refill   acetaminophen (TYLENOL) 325 MG tablet Take 650 mg by mouth every 6 (six) hours as needed (pain.).      cetirizine (ZYRTEC) 10 MG tablet Take 10 mg by mouth daily.     clindamycin (CLEOCIN T) 1 % external solution Apply 1 % topically.     diltiazem (CARDIZEM CD) 180 MG 24 hr capsule Take 180 mg by mouth daily.     furosemide (LASIX) 20 MG tablet Take 20 mg by mouth 2 (two) times daily.     metoprolol succinate (TOPROL-XL) 50 MG 24 hr tablet Take 50 mg by mouth daily.     tamsulosin (FLOMAX) 0.4 MG CAPS capsule Take 1 capsule (0.4 mg total) by mouth daily after breakfast. 90 capsule 3   timolol (TIMOPTIC) 0.5 % ophthalmic solution Place 1 drop into both eyes daily.      No current facility-administered medications for this visit.    OBJECTIVE: Vitals:   10/27/21 1341  BP: 139/80  Pulse: (!) 54  Resp: 20  Temp: 98.7 F (37.1 C)  SpO2: 100%     Body mass index is 31.17 kg/m.    ECOG FS:0 - Asymptomatic  General: Well-developed, well-nourished, no acute distress. Ambulating well on his own with gentle assistance of cane  Eyes:  Pallor of conjunctiva  HEENT: Normocephalic, moist mucous membranes. Lungs: No audible wheezing or coughing. Heart: 2/6 systolic murmur, irregular irregular rhythm  Musculoskeletal: No edema, cyanosis, or clubbing. Neuro: Alert, answering all questions appropriately. Cranial nerves grossly intact. Skin: No rashes or petechiae noted. Psych: Normal affect.   LAB RESULTS:  Lab Results  Component Value Date   NA 138 04/21/2019   K 3.4 (L) 04/21/2019   CL 106 04/21/2019   CO2 26 04/21/2019   GLUCOSE 112 (H) 04/21/2019   BUN 16 04/21/2019   CREATININE 0.69 04/21/2019   CALCIUM 8.0 (L) 04/21/2019   GFRNONAA >60 04/21/2019   GFRAA >60 04/21/2019    Lab Results  Component Value Date   WBC 5.6 10/26/2021   NEUTROABS 3.6 10/26/2021   HGB 11.5 (L) 10/26/2021   HCT 35.9 (L) 10/26/2021   MCV 97.6 10/26/2021   PLT 223 10/26/2021  Lab Results  Component Value Date   IRON 30 (L) 10/26/2021   TIBC 494 (H) 10/26/2021   IRONPCTSAT 6 (L) 10/26/2021   Lab Results  Component Value Date   FERRITIN 13 (L) 10/26/2021     STUDIES: No results found.  ASSESSMENT: Iron deficiency anemia  1. Iron deficiency anemia: Acute on chronic with consistently decreased iron stores.  He is also mildly symptomatic. Has had a normal colonoscopy on June 26, 2016.  His most recent EGD on Aug 07, 2019 did not reveal any significant pathology. Today we discussed switching to Venofer vs Monoferric given that his iron counts have not significantly improved on the Feraheme longer term. Today he wishes to proceed forward with IV Feraheme; return in 1 week for his second infusion. Instead of returning in 3 months we will have him return in 2 so we can assess if we wish to switch to a different IV iron or continue with the Feraheme.  2.  B12 deficiency: Chronic nature. Lower end of normal result today. Sporadically gets B12 injections. Prefers not to today- does not like needles. Will trial taking oral  supplementation.   PLAN: IV Feraheme today RTC 1 week IV Feraheme RTC 2 months MD, Labs (CBC, CMP, Iron/TIBC, ferritin, B12)1-7 days prior.   I spent a total of 30 minutes reviewing chart data, face-to-face evaluation with the patient, counseling and coordination of care as detailed above.    Patient expressed understanding and was in agreement with this plan. He also understands that He can call clinic at any time with any questions, concerns, or complaints.    Hughie Closs, PA-C   10/27/2021 2:07 PM

## 2021-10-27 NOTE — Patient Instructions (Signed)

## 2021-10-28 DIAGNOSIS — M12811 Other specific arthropathies, not elsewhere classified, right shoulder: Secondary | ICD-10-CM | POA: Diagnosis not present

## 2021-11-03 ENCOUNTER — Ambulatory Visit: Payer: PPO

## 2021-11-07 ENCOUNTER — Inpatient Hospital Stay: Payer: PPO

## 2021-11-07 VITALS — BP 136/82 | HR 71 | Temp 98.1°F | Resp 16 | Wt 193.7 lb

## 2021-11-07 DIAGNOSIS — M9903 Segmental and somatic dysfunction of lumbar region: Secondary | ICD-10-CM | POA: Diagnosis not present

## 2021-11-07 DIAGNOSIS — M9905 Segmental and somatic dysfunction of pelvic region: Secondary | ICD-10-CM | POA: Diagnosis not present

## 2021-11-07 DIAGNOSIS — D509 Iron deficiency anemia, unspecified: Secondary | ICD-10-CM

## 2021-11-07 DIAGNOSIS — M5432 Sciatica, left side: Secondary | ICD-10-CM | POA: Diagnosis not present

## 2021-11-07 DIAGNOSIS — M955 Acquired deformity of pelvis: Secondary | ICD-10-CM | POA: Diagnosis not present

## 2021-11-07 MED ORDER — SODIUM CHLORIDE 0.9 % IV SOLN
510.0000 mg | Freq: Once | INTRAVENOUS | Status: AC
  Administered 2021-11-07: 510 mg via INTRAVENOUS
  Filled 2021-11-07: qty 510

## 2021-11-07 MED ORDER — SODIUM CHLORIDE 0.9 % IV SOLN
Freq: Once | INTRAVENOUS | Status: AC
  Filled 2021-11-07: qty 250

## 2021-11-09 DIAGNOSIS — M12811 Other specific arthropathies, not elsewhere classified, right shoulder: Secondary | ICD-10-CM | POA: Diagnosis not present

## 2021-11-16 ENCOUNTER — Ambulatory Visit: Payer: PPO | Admitting: Urology

## 2021-11-16 ENCOUNTER — Encounter: Payer: Self-pay | Admitting: Urology

## 2021-11-16 VITALS — BP 172/93 | HR 114 | Ht 66.0 in | Wt 190.0 lb

## 2021-11-16 DIAGNOSIS — N401 Enlarged prostate with lower urinary tract symptoms: Secondary | ICD-10-CM

## 2021-11-16 LAB — URINALYSIS, COMPLETE
Bilirubin, UA: NEGATIVE
Glucose, UA: NEGATIVE
Ketones, UA: NEGATIVE
Leukocytes,UA: NEGATIVE
Nitrite, UA: NEGATIVE
Protein,UA: NEGATIVE
Specific Gravity, UA: 1.02 (ref 1.005–1.030)
Urobilinogen, Ur: 2 mg/dL — ABNORMAL HIGH (ref 0.2–1.0)
pH, UA: 7 (ref 5.0–7.5)

## 2021-11-16 LAB — MICROSCOPIC EXAMINATION: Bacteria, UA: NONE SEEN

## 2021-11-16 LAB — BLADDER SCAN AMB NON-IMAGING: Scan Result: 18

## 2021-11-16 NOTE — Progress Notes (Signed)
11/16/2021 2:25 PM   Marcus Coleman 1941-02-12 [id number]  Referring provider: Idelle Crouch, MD [facility] [place],  [place] [address]  Chief Complaint  Patient presents with   Benign Prostatic Hypertrophy    HPI: 81 y.o. male presents for annual follow-up BPH  Taking Lasix and because of this have stopped the tamsulosin No bothersome LUTS; urinary frequency secondary to Lasix Denies dysuria, gross hematuria No flank, abdominal or pelvic pain PCP has continued checking PSAs and was 0.11 on 07/19/2021 Since last visit was diagnosed with atrial fibrillation   PMH: Past Medical History:  Diagnosis Date   Anemia    Atrial fibrillation (HCC)    Chronic diarrhea    DDD (degenerative disc disease), cervical    Hyperlipidemia    Hypertension    borderline   IDA (iron deficiency anemia)    Low testosterone    Vasovagal syncope     Surgical History: Past Surgical History:  Procedure Laterality Date   CARDIAC CATHETERIZATION     catracts Bilateral    CHOLECYSTECTOMY     COLONOSCOPY WITH PROPOFOL N/A 06/26/2016   Procedure: COLONOSCOPY WITH PROPOFOL;  Surgeon: Manya Silvas, MD;  Location: [facility] ENDOSCOPY;  Service: Endoscopy;  Laterality: N/A;   COLONOSCOPY WITH PROPOFOL N/A 07/03/2019   Procedure: COLONOSCOPY WITH PROPOFOL;  Surgeon: [name], MD;  Location: [facility] ENDOSCOPY;  Service: Gastroenterology;  Laterality: N/A;   ENDOSCOPIC RETROGRADE CHOLANGIOPANCREATOGRAPHY (ERCP) WITH PROPOFOL     ESOPHAGOGASTRODUODENOSCOPY N/A 04/19/2019   Procedure: ESOPHAGOGASTRODUODENOSCOPY (EGD);  Surgeon: [name], MD;  Location: [facility] ENDOSCOPY;  Service: Gastroenterology;  Laterality: N/A;   ESOPHAGOGASTRODUODENOSCOPY (EGD) WITH PROPOFOL N/A 07/03/2019   Procedure: ESOPHAGOGASTRODUODENOSCOPY (EGD) WITH PROPOFOL;  Surgeon: [name], MD;  Location: [facility] ENDOSCOPY;  Service: Gastroenterology;  Laterality: N/A;   ESOPHAGOGASTRODUODENOSCOPY (EGD) WITH  PROPOFOL N/A 08/07/2019   Procedure: ESOPHAGOGASTRODUODENOSCOPY (EGD) WITH PROPOFOL;  Surgeon: Milus Banister, MD;  Location: [facility] ENDOSCOPY;  Service: Endoscopy;  Laterality: N/A;   EUS N/A 08/07/2019   Procedure: UPPER ENDOSCOPIC ULTRASOUND (EUS) RADIAL;  Surgeon: Milus Banister, MD;  Location: [facility] ENDOSCOPY;  Service: Endoscopy;  Laterality: N/A;   eyelid surgery     FOOT SURGERY     HEMORROIDECTOMY     HERNIA REPAIR     mrercp     RHINOPLASTY     TONSILLECTOMY     WISDOM TOOTH EXTRACTION      Home Medications:  Allergies as of 11/16/2021       Reactions   Nsaids    Can take Ibuprofen in moderation/high doses or prolonged use causes GI bleed        Medication List        Accurate as of November 16, 2021  2:25 PM. If you have any questions, ask your nurse or doctor.          acetaminophen 325 MG tablet Commonly known as: TYLENOL Take 650 mg by mouth every 6 (six) hours as needed (pain.).   cetirizine 10 MG tablet Commonly known as: ZYRTEC Take 10 mg by mouth daily.   clindamycin 1 % external solution Commonly known as: CLEOCIN T Apply 1 % topically.   diltiazem 180 MG 24 hr capsule Commonly known as: CARDIZEM CD Take 180 mg by mouth daily.   furosemide 20 MG tablet Commonly known as: LASIX Take 20 mg by mouth 2 (two) times daily.   metoprolol succinate 50 MG 24 hr tablet Commonly known as: TOPROL-XL Take 50  mg by mouth daily.   tamsulosin 0.4 MG Caps capsule Commonly known as: FLOMAX Take 1 capsule (0.4 mg total) by mouth daily after breakfast.   timolol 0.5 % ophthalmic solution Commonly known as: TIMOPTIC Place 1 drop into both eyes daily.        Allergies:  Allergies  Allergen Reactions   Nsaids     Can take Ibuprofen in moderation/high doses or prolonged use causes GI bleed    Family History: Family History  Problem Relation Age of Onset   Hypothyroidism Mother    Diabetes Mother    Lung cancer Mother     Social History:  reports  that he quit smoking about 34 years ago. His smoking use included cigarettes. He has a 25.00 pack-year smoking history. He has never used smokeless tobacco. He reports current alcohol use of about 14.0 standard drinks of alcohol per week. He reports that he does not use drugs.   Physical Exam: There were no vitals taken for this visit.  Constitutional:  Alert and oriented, No acute distress. HEENT: [place] AT, moist mucus membranes.  Trachea midline, no masses. Cardiovascular: No clubbing, cyanosis, or edema. Respiratory: Normal respiratory effort, no increased work of breathing.    Assessment & Plan:    1. Benign prostatic hyperplasia with lower urinary tract symptoms Stable Bladder scan PVR 18 mL Continue annual follow-up and instructed call earlier for any worsening of his voiding pattern   Abbie Sons, MD  [place] [address] [place], [place] [address] [phone]

## 2021-11-17 DIAGNOSIS — M12811 Other specific arthropathies, not elsewhere classified, right shoulder: Secondary | ICD-10-CM | POA: Diagnosis not present

## 2021-11-20 ENCOUNTER — Telehealth: Payer: Self-pay | Admitting: Urology

## 2021-11-20 DIAGNOSIS — R3129 Other microscopic hematuria: Secondary | ICD-10-CM

## 2021-11-20 NOTE — Telephone Encounter (Signed)
UA at last week's visit showed abnormal microscopic blood in the urine.  The recommended evaluation for even a single UA showing microscopic blood is a CT urogram and cystoscopy which I would recommend scheduling.  CTU order placed.  Please schedule cystoscopy.  Let me know if he has any questions.

## 2021-11-21 DIAGNOSIS — E7800 Pure hypercholesterolemia, unspecified: Secondary | ICD-10-CM | POA: Diagnosis not present

## 2021-11-21 DIAGNOSIS — Z79899 Other long term (current) drug therapy: Secondary | ICD-10-CM | POA: Diagnosis not present

## 2021-11-21 DIAGNOSIS — D500 Iron deficiency anemia secondary to blood loss (chronic): Secondary | ICD-10-CM | POA: Diagnosis not present

## 2021-11-21 DIAGNOSIS — I4819 Other persistent atrial fibrillation: Secondary | ICD-10-CM | POA: Diagnosis not present

## 2021-11-21 DIAGNOSIS — R030 Elevated blood-pressure reading, without diagnosis of hypertension: Secondary | ICD-10-CM | POA: Diagnosis not present

## 2021-11-21 DIAGNOSIS — M5030 Other cervical disc degeneration, unspecified cervical region: Secondary | ICD-10-CM | POA: Diagnosis not present

## 2021-11-23 DIAGNOSIS — M12812 Other specific arthropathies, not elsewhere classified, left shoulder: Secondary | ICD-10-CM | POA: Diagnosis not present

## 2021-11-25 ENCOUNTER — Other Ambulatory Visit: Payer: PPO | Admitting: Urology

## 2021-11-28 DIAGNOSIS — M9903 Segmental and somatic dysfunction of lumbar region: Secondary | ICD-10-CM | POA: Diagnosis not present

## 2021-11-28 DIAGNOSIS — M955 Acquired deformity of pelvis: Secondary | ICD-10-CM | POA: Diagnosis not present

## 2021-11-28 DIAGNOSIS — M9905 Segmental and somatic dysfunction of pelvic region: Secondary | ICD-10-CM | POA: Diagnosis not present

## 2021-11-28 DIAGNOSIS — M5432 Sciatica, left side: Secondary | ICD-10-CM | POA: Diagnosis not present

## 2021-11-29 ENCOUNTER — Encounter: Payer: Self-pay | Admitting: Oncology

## 2021-11-29 ENCOUNTER — Ambulatory Visit (INDEPENDENT_AMBULATORY_CARE_PROVIDER_SITE_OTHER): Payer: PPO | Admitting: Vascular Surgery

## 2021-11-30 ENCOUNTER — Ambulatory Visit
Admission: RE | Admit: 2021-11-30 | Discharge: 2021-11-30 | Disposition: A | Discharge status: Home / Self Care | Payer: Self-pay | Source: Ambulatory Visit | Attending: Neurosurgery | Admitting: Neurosurgery

## 2021-11-30 ENCOUNTER — Other Ambulatory Visit: Payer: Self-pay

## 2021-11-30 DIAGNOSIS — Z049 Encounter for examination and observation for unspecified reason: Secondary | ICD-10-CM

## 2021-11-30 DIAGNOSIS — M12811 Other specific arthropathies, not elsewhere classified, right shoulder: Secondary | ICD-10-CM | POA: Diagnosis not present

## 2021-12-01 DIAGNOSIS — I340 Nonrheumatic mitral (valve) insufficiency: Secondary | ICD-10-CM | POA: Diagnosis not present

## 2021-12-01 DIAGNOSIS — Z86718 Personal history of other venous thrombosis and embolism: Secondary | ICD-10-CM | POA: Diagnosis not present

## 2021-12-01 DIAGNOSIS — E7800 Pure hypercholesterolemia, unspecified: Secondary | ICD-10-CM | POA: Diagnosis not present

## 2021-12-01 DIAGNOSIS — I4819 Other persistent atrial fibrillation: Secondary | ICD-10-CM | POA: Diagnosis not present

## 2021-12-05 DIAGNOSIS — H40002 Preglaucoma, unspecified, left eye: Secondary | ICD-10-CM | POA: Diagnosis not present

## 2021-12-05 NOTE — Progress Notes (Unsigned)
Referring Physician:  Idelle Crouch, MD [facility], [place],  [place] [address]  Primary Physician:  Idelle Crouch, MD  History of Present Illness: 12/05/2021 Mr. Marcus Coleman is here today with a chief complaint of posterior neck pain.  He has been having neck pain for a couple of years.  He reports aching pain that is made worse by doing nothing.  Cervical traction has helped.  Chiropractic manipulation helps.  He has no radicular or myelopathic complaints.  He is most concerned that he will develop a deformity in his neck.  He is also having low back pain but no radicular complaints. Bowel/Bladder Dysfunction: none  Conservative measures: chiropractor Physical therapy:  has not participated in for his neck; has participated for right rotator cuff Multimodal medical therapy including regular antiinflammatories: tylenol, gabapentin, NSAIDs Injections: has not received any epidural steroid injections in his neck, only in his back  Past Surgery: denies  PRESLEY SUMMERLIN has no symptoms of cervical myelopathy.  The symptoms are causing a significant impact on the patient's life.   Review of Systems:  A 10 point review of systems is negative, except for the pertinent positives and negatives detailed in the HPI.  Past Medical History: Past Medical History:  Diagnosis Date   Anemia    Atrial fibrillation (HCC)    Chronic diarrhea    DDD (degenerative disc disease), cervical    Hyperlipidemia    Hypertension    borderline   IDA (iron deficiency anemia)    Low testosterone    Vasovagal syncope     Past Surgical History: Past Surgical History:  Procedure Laterality Date   CARDIAC CATHETERIZATION     catracts Bilateral    CHOLECYSTECTOMY     COLONOSCOPY WITH PROPOFOL N/A 06/26/2016   Procedure: COLONOSCOPY WITH PROPOFOL;  Surgeon: Manya Silvas, MD;  Location: [facility] ENDOSCOPY;  Service: Endoscopy;  Laterality: N/A;   COLONOSCOPY WITH  PROPOFOL N/A 07/03/2019   Procedure: COLONOSCOPY WITH PROPOFOL;  Surgeon: [name], MD;  Location: [facility] ENDOSCOPY;  Service: Gastroenterology;  Laterality: N/A;   ENDOSCOPIC RETROGRADE CHOLANGIOPANCREATOGRAPHY (ERCP) WITH PROPOFOL     ESOPHAGOGASTRODUODENOSCOPY N/A 04/19/2019   Procedure: ESOPHAGOGASTRODUODENOSCOPY (EGD);  Surgeon: [name], MD;  Location: [facility] ENDOSCOPY;  Service: Gastroenterology;  Laterality: N/A;   ESOPHAGOGASTRODUODENOSCOPY (EGD) WITH PROPOFOL N/A 07/03/2019   Procedure: ESOPHAGOGASTRODUODENOSCOPY (EGD) WITH PROPOFOL;  Surgeon: [name], MD;  Location: [facility] ENDOSCOPY;  Service: Gastroenterology;  Laterality: N/A;   ESOPHAGOGASTRODUODENOSCOPY (EGD) WITH PROPOFOL N/A 08/07/2019   Procedure: ESOPHAGOGASTRODUODENOSCOPY (EGD) WITH PROPOFOL;  Surgeon: Milus Banister, MD;  Location: [facility] ENDOSCOPY;  Service: Endoscopy;  Laterality: N/A;   EUS N/A 08/07/2019   Procedure: UPPER ENDOSCOPIC ULTRASOUND (EUS) RADIAL;  Surgeon: Milus Banister, MD;  Location: [facility] ENDOSCOPY;  Service: Endoscopy;  Laterality: N/A;   eyelid surgery     FOOT SURGERY     HEMORROIDECTOMY     HERNIA REPAIR     mrercp     RHINOPLASTY     TONSILLECTOMY     WISDOM TOOTH EXTRACTION      Allergies: Allergies as of 12/06/2021 - Review Complete 11/16/2021  Allergen Reaction Noted   Nsaids  06/23/2016    Medications: No outpatient medications have been marked as taking for the 12/06/21 encounter (Appointment) with Meade Maw, MD.    Social History: Social History   Tobacco Use   Smoking status: Former    Packs/day: 1.00    Years: 25.00  Total pack years: 25.00    Types: Cigarettes    Quit date: 12/05/1986    Years since quitting: 35.0   Smokeless tobacco: Never  Vaping Use   Vaping Use: Never used  Substance Use Topics   Alcohol use: Yes    Alcohol/week: 14.0 standard drinks of alcohol    Types: 14 Cans of beer per week    Comment: 2 beers per day/ a couple glasses of wine     Drug use: No    Family Medical History: Family History  Problem Relation Age of Onset   Hypothyroidism Mother    Diabetes Mother    Lung cancer Mother     Physical Examination: There were no vitals filed for this visit.  General: Patient is well developed, well nourished, calm, collected, and in no apparent distress. Attention to examination is appropriate.  Neck:   Supple.  Limited range of motion.  Respiratory: Patient is breathing without any difficulty.   NEUROLOGICAL:     Awake, alert, oriented to person, place, and time.  Speech is clear and fluent. Fund of knowledge is appropriate.   Cranial Nerves: Pupils equal round and reactive to light.  Facial tone is symmetric.  Facial sensation is symmetric. Shoulder shrug is symmetric. Tongue protrusion is midline.  There is no pronator drift.  ROM of spine: full.    Strength: Side Biceps Triceps Deltoid Interossei Grip Wrist Ext. Wrist Flex.  R '5 5 5 5 5 5 5  '$ L '5 5 5 5 5 5 5   '$ Side Iliopsoas Quads Hamstring PF DF EHL  R '5 5 5 5 5 5  '$ L '5 5 5 5 5 5   '$ Reflexes are 1+ and symmetric at the biceps, triceps, brachioradialis, patella and achilles.   Hoffman's is absent.   Bilateral upper and lower extremity sensation is intact to light touch.    No evidence of dysmetria noted.  Gait requires a cane  Medical Decision Making  Imaging: Xrays C spine 09/01/21 X-rays of the cervical spine were obtained in the office today including  AP, lateral, oblique, flexion, and extension views. Severe degenerative  disc disease with endplate osteophyte formation is noted at C3-4, C4-5,  C5-6, and C6-7. Advanced spondylosis is noted. No instability is noted on  flexion-extension views.   I have personally reviewed the images and agree with the above interpretation.  Assessment and Plan: Mr. Formica is a pleasant 81 y.o. male with neck pain from cervical spondylosis.  He also has low back pain.  He has significant lumbar spondylosis as  well.  I recommended that he try physical therapy for both of these issues.  If his symptoms worsen, would reconsider for injections.  I think it is unlikely he will need surgical intervention.  I will see him back on an as-needed basis if his symptoms worsen.  I did let him know that I cannot recommend chiropractic manipulation in the cervical spine, but that I am supportive that he continue his relationship with his chiropractor.   I spent a total of 30 minutes in face-to-face and non-face-to-face activities related to this patient's care today.  Thank you for involving me in the care of this patient.      Adama Ivins K. Izora Ribas MD, [facility]

## 2021-12-06 ENCOUNTER — Ambulatory Visit: Payer: PPO | Admitting: Neurosurgery

## 2021-12-06 ENCOUNTER — Encounter: Payer: Self-pay | Admitting: Neurosurgery

## 2021-12-06 VITALS — BP 138/85 | HR 93 | Ht 66.0 in | Wt 200.8 lb

## 2021-12-06 DIAGNOSIS — G8929 Other chronic pain: Secondary | ICD-10-CM

## 2021-12-06 DIAGNOSIS — M47812 Spondylosis without myelopathy or radiculopathy, cervical region: Secondary | ICD-10-CM | POA: Diagnosis not present

## 2021-12-06 DIAGNOSIS — M545 Low back pain, unspecified: Secondary | ICD-10-CM | POA: Diagnosis not present

## 2021-12-07 DIAGNOSIS — M12811 Other specific arthropathies, not elsewhere classified, right shoulder: Secondary | ICD-10-CM | POA: Diagnosis not present

## 2021-12-08 ENCOUNTER — Encounter: Payer: Self-pay | Admitting: Neurosurgery

## 2021-12-12 DIAGNOSIS — M955 Acquired deformity of pelvis: Secondary | ICD-10-CM | POA: Diagnosis not present

## 2021-12-12 DIAGNOSIS — M9903 Segmental and somatic dysfunction of lumbar region: Secondary | ICD-10-CM | POA: Diagnosis not present

## 2021-12-12 DIAGNOSIS — M5432 Sciatica, left side: Secondary | ICD-10-CM | POA: Diagnosis not present

## 2021-12-12 DIAGNOSIS — M9905 Segmental and somatic dysfunction of pelvic region: Secondary | ICD-10-CM | POA: Diagnosis not present

## 2021-12-16 ENCOUNTER — Ambulatory Visit: Payer: PPO | Admitting: Orthopedic Surgery

## 2021-12-16 DIAGNOSIS — H401111 Primary open-angle glaucoma, right eye, mild stage: Secondary | ICD-10-CM | POA: Diagnosis not present

## 2021-12-21 DIAGNOSIS — M542 Cervicalgia: Secondary | ICD-10-CM | POA: Diagnosis not present

## 2021-12-22 ENCOUNTER — Other Ambulatory Visit: Payer: PPO | Admitting: Urology

## 2021-12-22 NOTE — Progress Notes (Signed)
[facility]  [phone] Fax:(336) [phone]  ID: RAGAN REALE OB: [birth year]  MR#: [id number]  HTD#:[id number]  Patient Care Team: Idelle Crouch, MD as PCP - General (Internal Medicine) Lloyd Huger, MD as Consulting Physician (Hematology and Oncology)  CHIEF COMPLAINT: Iron deficiency anemia.  INTERVAL HISTORY: Patient returns to clinic today for repeat laboratory work, further evaluation, and consideration of additional IV Feraheme.  He continues to have mild weakness and fatigue, but otherwise feels well.  We had a lengthy discussion today about his love for scuba diving. He has no neurologic complaints.  He denies any recent fevers or illnesses.  He has a good appetite and denies weight loss.  He denies any chest pain, shortness of breath, cough, or hemoptysis.  He denies any nausea, vomiting, constipation, or diarrhea.  He has no melena or hematochezia.  He has no urinary complaints.  Patient offers no further specific complaints today.  REVIEW OF SYSTEMS:   Review of Systems  Constitutional:  Positive for malaise/fatigue. Negative for fever and weight loss.  Respiratory: Negative.  Negative for cough, hemoptysis and shortness of breath.   Cardiovascular: Negative.  Negative for chest pain and leg swelling.  Gastrointestinal: Negative.  Negative for abdominal pain, blood in stool and melena.  Genitourinary: Negative.  Negative for hematuria.  Musculoskeletal: Negative.  Negative for back pain.  Skin: Negative.  Negative for rash.  Neurological:  Positive for weakness. Negative for dizziness, focal weakness and headaches.  Psychiatric/Behavioral: Negative.  The patient is not nervous/anxious.     As per HPI. Otherwise, a complete review of systems is negative.  PAST MEDICAL HISTORY: Past Medical History:  Diagnosis Date   Anemia    Atrial fibrillation (HCC)    Chronic diarrhea    DDD (degenerative disc disease), cervical     Hyperlipidemia    Hypertension    borderline   IDA (iron deficiency anemia)    Low testosterone    Vasovagal syncope     PAST SURGICAL HISTORY: Past Surgical History:  Procedure Laterality Date   boils removed from right forearm  [place] EXTRACTION Left 2008   catracts Bilateral    left 2008, right 2013   CHOLECYSTECTOMY     COLONOSCOPY WITH PROPOFOL N/A 06/26/2016   Procedure: COLONOSCOPY WITH PROPOFOL;  Surgeon: Manya Silvas, MD;  Location: [place];  Service: Endoscopy;  Laterality: N/A;   COLONOSCOPY WITH PROPOFOL N/A 07/03/2019   Procedure: COLONOSCOPY WITH PROPOFOL;  Surgeon: [name], MD;  Location: [facility] ENDOSCOPY;  Service: Gastroenterology;  Laterality: N/A;   ENDOSCOPIC RETROGRADE CHOLANGIOPANCREATOGRAPHY (ERCP) WITH PROPOFOL     ENDOSCOPIC VEIN LASER TREATMENT     right leg 05/2015, left leg 09/2015   ESOPHAGOGASTRODUODENOSCOPY N/A 04/19/2019   Procedure: ESOPHAGOGASTRODUODENOSCOPY (EGD);  Surgeon: [name], MD;  Location: [facility] ENDOSCOPY;  Service: Gastroenterology;  Laterality: N/A;   ESOPHAGOGASTRODUODENOSCOPY (EGD) WITH PROPOFOL N/A 07/03/2019   Procedure: ESOPHAGOGASTRODUODENOSCOPY (EGD) WITH PROPOFOL;  Surgeon: [name], MD;  Location: [facility] ENDOSCOPY;  Service: Gastroenterology;  Laterality: N/A;   ESOPHAGOGASTRODUODENOSCOPY (EGD) WITH PROPOFOL N/A 08/07/2019   Procedure: ESOPHAGOGASTRODUODENOSCOPY (EGD) WITH PROPOFOL;  Surgeon: Milus Banister, MD;  Location: [facility] ENDOSCOPY;  Service: Endoscopy;  Laterality: N/A;   EUS N/A 08/07/2019   Procedure: UPPER ENDOSCOPIC ULTRASOUND (EUS) RADIAL;  Surgeon: Milus Banister, MD;  Location: [facility] ENDOSCOPY;  Service: Endoscopy;  Laterality: N/A;   eyelid surgery     FOOT SURGERY Right  2009   HEMORROIDECTOMY     HERNIA REPAIR     mrercp  2014   NOSE SURGERY  2011   RHINOPLASTY     squamous cell carcinoma removed from right hand  2013   TONSILLECTOMY     WISDOM TOOTH  EXTRACTION      FAMILY HISTORY: Family History  Problem Relation Age of Onset   Hypothyroidism Mother    Diabetes Mother    Lung cancer Mother     ADVANCED DIRECTIVES (Y/N):  N  HEALTH MAINTENANCE: Social History   Tobacco Use   Smoking status: Former    Packs/day: 1.00    Years: 25.00    Total pack years: 25.00    Types: Cigarettes    Quit date: 12/05/1986    Years since quitting: 35.0   Smokeless tobacco: Never  Vaping Use   Vaping Use: Never used  Substance Use Topics   Alcohol use: Yes    Alcohol/week: 14.0 standard drinks of alcohol    Types: 14 Cans of beer per week    Comment: 2 beers per day/ a couple glasses of wine    Drug use: No     Colonoscopy:  PAP:  Bone density:  Lipid panel:  Allergies  Allergen Reactions   Nsaids     Can take Ibuprofen in moderation/high doses or prolonged use causes GI bleed    Current Outpatient Medications  Medication Sig Dispense Refill   acetaminophen (TYLENOL) 650 MG CR tablet Take 650 mg by mouth every 8 (eight) hours as needed for pain.     cetirizine (ZYRTEC) 10 MG tablet Take 10 mg by mouth daily.     clindamycin (CLEOCIN T) 1 % external solution Apply 1 % topically.     diltiazem (CARDIZEM CD) 180 MG 24 hr capsule Take 180 mg by mouth daily.     furosemide (LASIX) 20 MG tablet Take 20 mg by mouth 2 (two) times daily.     metoprolol succinate (TOPROL-XL) 50 MG 24 hr tablet Take 50 mg by mouth daily.     timolol (TIMOPTIC) 0.5 % ophthalmic solution Place 1 drop into both eyes daily.      No current facility-administered medications for this visit.    OBJECTIVE: Vitals:   12/29/21 1336  BP: (!) 132/94  Pulse: 98  Temp: (!) 96.7 F (35.9 C)  SpO2: 97%     Body mass index is 32.44 kg/m.    ECOG FS:0 - Asymptomatic  General: Well-developed, well-nourished, no acute distress. Eyes: Pink conjunctiva, anicteric sclera. HEENT: Normocephalic, moist mucous membranes. Lungs: No audible wheezing or  coughing. Heart: Regular rate and rhythm. Abdomen: Soft, nontender, no obvious distention. Musculoskeletal: No edema, cyanosis, or clubbing. Neuro: Alert, answering all questions appropriately. Cranial nerves grossly intact. Skin: No rashes or petechiae noted. Psych: Normal affect.   LAB RESULTS:  Lab Results  Component Value Date   NA 141 12/26/2021   K 4.4 12/26/2021   CL 106 12/26/2021   CO2 30 12/26/2021   GLUCOSE 105 (H) 12/26/2021   BUN 22 12/26/2021   CREATININE 0.77 12/26/2021   CALCIUM 8.6 (L) 12/26/2021   PROT 6.7 12/26/2021   ALBUMIN 3.9 12/26/2021   AST 18 12/26/2021   ALT 14 12/26/2021   ALKPHOS 80 12/26/2021   BILITOT 0.7 12/26/2021   GFRNONAA >60 12/26/2021   GFRAA >60 04/21/2019    Lab Results  Component Value Date   WBC 5.3 12/26/2021   NEUTROABS 3.4 12/26/2021   HGB 11.8 (  L) 12/26/2021   HCT 36.6 (L) 12/26/2021   MCV 98.1 12/26/2021   PLT 220 12/26/2021   Lab Results  Component Value Date   IRON 31 (L) 12/26/2021   TIBC 465 (H) 12/26/2021   IRONPCTSAT 7 (L) 12/26/2021   Lab Results  Component Value Date   FERRITIN 17 (L) 12/26/2021     STUDIES: No results found.  ASSESSMENT: Iron deficiency anemia  1. Iron deficiency anemia: Patient's hemoglobin has improved to 11.8, although his iron stores remain reduced.  He had a normal colonoscopy on June 26, 2016.  His most recent EGD on Aug 07, 2019 did not reveal any significant pathology.  He last received Feraheme on November 07, 2021.  Patient does not wish to have treatment today, but acknowledged he likely will at his next clinic visit.  No intervention needed.  Return to clinic in 3 months with repeat laboratory work, further evaluation, and continuation of treatment.     2.  B12 deficiency: B12 levels continue to be within normal limits.  Patient last received treatment on Jul 26, 2021.  I spent a total of 20 minutes reviewing chart data, face-to-face evaluation with the patient, counseling  and coordination of care as detailed above.   Patient expressed understanding and was in agreement with this plan. He also understands that He can call clinic at any time with any questions, concerns, or complaints.    Lloyd Huger, MD   12/30/2021 8:08 AM

## 2021-12-26 ENCOUNTER — Inpatient Hospital Stay: Payer: PPO | Attending: Oncology

## 2021-12-26 ENCOUNTER — Other Ambulatory Visit: Payer: PPO

## 2021-12-26 DIAGNOSIS — D509 Iron deficiency anemia, unspecified: Secondary | ICD-10-CM | POA: Insufficient documentation

## 2021-12-26 DIAGNOSIS — M75122 Complete rotator cuff tear or rupture of left shoulder, not specified as traumatic: Secondary | ICD-10-CM | POA: Insufficient documentation

## 2021-12-26 DIAGNOSIS — E538 Deficiency of other specified B group vitamins: Secondary | ICD-10-CM | POA: Diagnosis not present

## 2021-12-26 DIAGNOSIS — Z87891 Personal history of nicotine dependence: Secondary | ICD-10-CM | POA: Insufficient documentation

## 2021-12-26 DIAGNOSIS — Z801 Family history of malignant neoplasm of trachea, bronchus and lung: Secondary | ICD-10-CM | POA: Diagnosis not present

## 2021-12-26 DIAGNOSIS — M7582 Other shoulder lesions, left shoulder: Secondary | ICD-10-CM | POA: Insufficient documentation

## 2021-12-26 DIAGNOSIS — M12812 Other specific arthropathies, not elsewhere classified, left shoulder: Secondary | ICD-10-CM | POA: Diagnosis not present

## 2021-12-26 DIAGNOSIS — I10 Essential (primary) hypertension: Secondary | ICD-10-CM | POA: Insufficient documentation

## 2021-12-26 LAB — CBC WITH DIFFERENTIAL/PLATELET
Abs Immature Granulocytes: 0.01 10*3/uL (ref 0.00–0.07)
Basophils Absolute: 0 10*3/uL (ref 0.0–0.1)
Basophils Relative: 1 %
Eosinophils Absolute: 0.2 10*3/uL (ref 0.0–0.5)
Eosinophils Relative: 3 %
HCT: 36.6 % — ABNORMAL LOW (ref 39.0–52.0)
Hemoglobin: 11.8 g/dL — ABNORMAL LOW (ref 13.0–17.0)
Immature Granulocytes: 0 %
Lymphocytes Relative: 20 %
Lymphs Abs: 1.1 10*3/uL (ref 0.7–4.0)
MCH: 31.6 pg (ref 26.0–34.0)
MCHC: 32.2 g/dL (ref 30.0–36.0)
MCV: 98.1 fL (ref 80.0–100.0)
Monocytes Absolute: 0.6 10*3/uL (ref 0.1–1.0)
Monocytes Relative: 11 %
Neutro Abs: 3.4 10*3/uL (ref 1.7–7.7)
Neutrophils Relative %: 65 %
Platelets: 220 10*3/uL (ref 150–400)
RBC: 3.73 MIL/uL — ABNORMAL LOW (ref 4.22–5.81)
RDW: 16.1 % — ABNORMAL HIGH (ref 11.5–15.5)
WBC: 5.3 10*3/uL (ref 4.0–10.5)
nRBC: 0 % (ref 0.0–0.2)

## 2021-12-26 LAB — COMPREHENSIVE METABOLIC PANEL
ALT: 14 U/L (ref 0–44)
AST: 18 U/L (ref 15–41)
Albumin: 3.9 g/dL (ref 3.5–5.0)
Alkaline Phosphatase: 80 U/L (ref 38–126)
Anion gap: 5 (ref 5–15)
BUN: 22 mg/dL (ref 8–23)
CO2: 30 mmol/L (ref 22–32)
Calcium: 8.6 mg/dL — ABNORMAL LOW (ref 8.9–10.3)
Chloride: 106 mmol/L (ref 98–111)
Creatinine, Ser: 0.77 mg/dL (ref 0.61–1.24)
GFR, Estimated: >60 mL/min (ref >60)
Glucose, Bld: 105 mg/dL — ABNORMAL HIGH (ref 70–99)
Potassium: 4.4 mmol/L (ref 3.5–5.1)
Sodium: 141 mmol/L (ref 135–145)
Total Bilirubin: 0.7 mg/dL (ref 0.3–1.2)
Total Protein: 6.7 g/dL (ref 6.5–8.1)

## 2021-12-26 LAB — IRON AND TIBC
Iron: 31 ug/dL — ABNORMAL LOW (ref 45–182)
Saturation Ratios: 7 % — ABNORMAL LOW (ref 17.9–39.5)
TIBC: 465 ug/dL — ABNORMAL HIGH (ref 250–450)
UIBC: 434 ug/dL

## 2021-12-26 LAB — FERRITIN: Ferritin: 17 ng/mL — ABNORMAL LOW (ref 24–336)

## 2021-12-26 LAB — VITAMIN B12: Vitamin B-12: 334 pg/mL (ref 180–914)

## 2021-12-27 ENCOUNTER — Other Ambulatory Visit: Payer: PPO

## 2021-12-27 ENCOUNTER — Other Ambulatory Visit (HOSPITAL_COMMUNITY): Payer: Self-pay | Admitting: Surgery

## 2021-12-27 ENCOUNTER — Other Ambulatory Visit: Payer: Self-pay | Admitting: Surgery

## 2021-12-27 DIAGNOSIS — M75122 Complete rotator cuff tear or rupture of left shoulder, not specified as traumatic: Secondary | ICD-10-CM

## 2021-12-27 DIAGNOSIS — M12812 Other specific arthropathies, not elsewhere classified, left shoulder: Secondary | ICD-10-CM

## 2021-12-27 DIAGNOSIS — M7582 Other shoulder lesions, left shoulder: Secondary | ICD-10-CM

## 2021-12-27 DIAGNOSIS — M542 Cervicalgia: Secondary | ICD-10-CM | POA: Diagnosis not present

## 2021-12-29 ENCOUNTER — Ambulatory Visit: Payer: PPO | Admitting: Oncology

## 2021-12-29 ENCOUNTER — Inpatient Hospital Stay (HOSPITAL_BASED_OUTPATIENT_CLINIC_OR_DEPARTMENT_OTHER): Payer: PPO | Admitting: Oncology

## 2021-12-29 ENCOUNTER — Encounter: Payer: Self-pay | Admitting: Oncology

## 2021-12-29 VITALS — BP 132/94 | HR 98 | Temp 96.7°F | Ht 66.0 in | Wt 201.0 lb

## 2021-12-29 DIAGNOSIS — D509 Iron deficiency anemia, unspecified: Secondary | ICD-10-CM | POA: Diagnosis not present

## 2021-12-30 ENCOUNTER — Encounter: Payer: Self-pay | Admitting: Oncology

## 2021-12-30 ENCOUNTER — Ambulatory Visit
Admission: RE | Admit: 2021-12-30 | Discharge: 2021-12-30 | Disposition: A | Discharge status: Home / Self Care | Payer: PPO | Source: Ambulatory Visit | Attending: Urology | Admitting: Urology

## 2021-12-30 ENCOUNTER — Ambulatory Visit (INDEPENDENT_AMBULATORY_CARE_PROVIDER_SITE_OTHER): Payer: PPO | Admitting: Vascular Surgery

## 2021-12-30 DIAGNOSIS — N200 Calculus of kidney: Secondary | ICD-10-CM | POA: Diagnosis not present

## 2021-12-30 DIAGNOSIS — M12812 Other specific arthropathies, not elsewhere classified, left shoulder: Secondary | ICD-10-CM | POA: Diagnosis not present

## 2021-12-30 DIAGNOSIS — M19012 Primary osteoarthritis, left shoulder: Secondary | ICD-10-CM | POA: Diagnosis not present

## 2021-12-30 DIAGNOSIS — M75122 Complete rotator cuff tear or rupture of left shoulder, not specified as traumatic: Secondary | ICD-10-CM | POA: Insufficient documentation

## 2021-12-30 DIAGNOSIS — M7582 Other shoulder lesions, left shoulder: Secondary | ICD-10-CM | POA: Insufficient documentation

## 2021-12-30 DIAGNOSIS — M25412 Effusion, left shoulder: Secondary | ICD-10-CM | POA: Diagnosis not present

## 2021-12-30 DIAGNOSIS — R3129 Other microscopic hematuria: Secondary | ICD-10-CM | POA: Diagnosis not present

## 2021-12-30 DIAGNOSIS — Z01818 Encounter for other preprocedural examination: Secondary | ICD-10-CM | POA: Diagnosis not present

## 2021-12-30 DIAGNOSIS — N289 Disorder of kidney and ureter, unspecified: Secondary | ICD-10-CM | POA: Diagnosis not present

## 2021-12-30 MED ORDER — IOHEXOL 300 MG/ML  SOLN
100.0000 mL | Freq: Once | INTRAMUSCULAR | Status: AC | PRN
  Administered 2021-12-30: 100 mL via INTRAVENOUS

## 2022-01-05 DIAGNOSIS — M542 Cervicalgia: Secondary | ICD-10-CM | POA: Diagnosis not present

## 2022-01-09 ENCOUNTER — Encounter (INDEPENDENT_AMBULATORY_CARE_PROVIDER_SITE_OTHER): Payer: Self-pay

## 2022-01-12 ENCOUNTER — Ambulatory Visit: Payer: PPO | Admitting: Urology

## 2022-01-13 ENCOUNTER — Encounter: Payer: Self-pay | Admitting: Urology

## 2022-01-13 ENCOUNTER — Ambulatory Visit: Payer: PPO | Admitting: Urology

## 2022-01-13 VITALS — BP 136/99 | HR 137 | Ht 66.0 in | Wt 201.0 lb

## 2022-01-13 DIAGNOSIS — M542 Cervicalgia: Secondary | ICD-10-CM | POA: Diagnosis not present

## 2022-01-13 DIAGNOSIS — N200 Calculus of kidney: Secondary | ICD-10-CM | POA: Diagnosis not present

## 2022-01-13 DIAGNOSIS — R3129 Other microscopic hematuria: Secondary | ICD-10-CM | POA: Diagnosis not present

## 2022-01-13 LAB — URINALYSIS, COMPLETE
Bilirubin, UA: NEGATIVE
Glucose, UA: NEGATIVE
Leukocytes,UA: NEGATIVE
Nitrite, UA: NEGATIVE
Specific Gravity, UA: 1.03 (ref 1.005–1.030)
Urobilinogen, Ur: 1 mg/dL (ref 0.2–1.0)
pH, UA: 5 (ref 5.0–7.5)

## 2022-01-13 LAB — MICROSCOPIC EXAMINATION

## 2022-01-13 NOTE — Progress Notes (Unsigned)
   01/13/22  CC:  Chief Complaint  Patient presents with   Cysto   cysto    HPI: Noted last office visit to have microhematuria.  CTU performed 12/30/2021 with a 5 mm nonobstructing left upper pole calculus and small left renal cyst.  Also noted to have hepatic and splenic cysts.  UA today with 3-10 RBC  Height '5\' 6"'$  (1.676 m), weight 201 lb (91.2 kg). NED. A&Ox3.   No respiratory distress   Abd soft, NT, ND Normal phallus with bilateral descended testicles  Cystoscopy Procedure Note  Patient identification was confirmed, informed consent was obtained, and patient was prepped using Betadine solution.  Lidocaine jelly was administered per urethral meatus.     Pre-Procedure: - Inspection reveals a normal caliber urethral meatus.  Procedure: The flexible cystoscope was introduced without difficulty - No urethral strictures/lesions are present. -  Prominent lateral lobe enlargement  prostate  - Normal bladder neck - Bilateral ureteral orifices identified - Bladder mucosa  reveals no ulcers, tumors, or lesions - No bladder stones - No trabeculation  The cystoscope would not retroflex   Post-Procedure: - Patient tolerated the procedure well  Assessment/ Plan: Nonobstructing left renal calculus No lower tract abnormalities on cystoscopy Annual follow-up scheduled 11/17/2022.  Will get a KUB at that appointment.   Abbie Sons, MD

## 2022-01-20 ENCOUNTER — Ambulatory Visit (INDEPENDENT_AMBULATORY_CARE_PROVIDER_SITE_OTHER): Payer: PPO | Admitting: Vascular Surgery

## 2022-01-25 DIAGNOSIS — M542 Cervicalgia: Secondary | ICD-10-CM | POA: Diagnosis not present

## 2022-01-26 ENCOUNTER — Other Ambulatory Visit: Payer: Self-pay | Admitting: Surgery

## 2022-01-26 DIAGNOSIS — M542 Cervicalgia: Secondary | ICD-10-CM | POA: Diagnosis not present

## 2022-01-27 DIAGNOSIS — R600 Localized edema: Secondary | ICD-10-CM | POA: Diagnosis not present

## 2022-01-27 DIAGNOSIS — J40 Bronchitis, not specified as acute or chronic: Secondary | ICD-10-CM | POA: Diagnosis not present

## 2022-01-27 DIAGNOSIS — D500 Iron deficiency anemia secondary to blood loss (chronic): Secondary | ICD-10-CM | POA: Diagnosis not present

## 2022-01-27 DIAGNOSIS — I4819 Other persistent atrial fibrillation: Secondary | ICD-10-CM | POA: Diagnosis not present

## 2022-01-27 HISTORY — DX: Bronchitis, not specified as acute or chronic: J40

## 2022-02-01 ENCOUNTER — Encounter: Payer: Self-pay | Admitting: Oncology

## 2022-02-01 DIAGNOSIS — M542 Cervicalgia: Secondary | ICD-10-CM | POA: Diagnosis not present

## 2022-02-07 ENCOUNTER — Encounter
Admission: RE | Admit: 2022-02-07 | Discharge: 2022-02-07 | Disposition: A | Discharge status: Home / Self Care | Payer: No Typology Code available for payment source | Source: Ambulatory Visit | Attending: Surgery | Admitting: Surgery

## 2022-02-07 VITALS — BP 109/80 | HR 68 | Resp 14 | Ht 66.0 in | Wt 208.8 lb

## 2022-02-07 DIAGNOSIS — R9431 Abnormal electrocardiogram [ECG] [EKG]: Secondary | ICD-10-CM | POA: Diagnosis not present

## 2022-02-07 DIAGNOSIS — E872 Acidosis, unspecified: Secondary | ICD-10-CM | POA: Diagnosis present

## 2022-02-07 DIAGNOSIS — G8929 Other chronic pain: Secondary | ICD-10-CM | POA: Diagnosis not present

## 2022-02-07 DIAGNOSIS — E785 Hyperlipidemia, unspecified: Secondary | ICD-10-CM | POA: Diagnosis present

## 2022-02-07 DIAGNOSIS — I10 Essential (primary) hypertension: Secondary | ICD-10-CM

## 2022-02-07 DIAGNOSIS — R55 Syncope and collapse: Secondary | ICD-10-CM | POA: Diagnosis not present

## 2022-02-07 DIAGNOSIS — N138 Other obstructive and reflux uropathy: Secondary | ICD-10-CM | POA: Diagnosis present

## 2022-02-07 DIAGNOSIS — M75122 Complete rotator cuff tear or rupture of left shoulder, not specified as traumatic: Secondary | ICD-10-CM | POA: Diagnosis not present

## 2022-02-07 DIAGNOSIS — R0902 Hypoxemia: Secondary | ICD-10-CM | POA: Diagnosis not present

## 2022-02-07 DIAGNOSIS — R0689 Other abnormalities of breathing: Secondary | ICD-10-CM | POA: Diagnosis not present

## 2022-02-07 DIAGNOSIS — R464 Slowness and poor responsiveness: Secondary | ICD-10-CM | POA: Diagnosis not present

## 2022-02-07 DIAGNOSIS — J9811 Atelectasis: Secondary | ICD-10-CM | POA: Diagnosis present

## 2022-02-07 DIAGNOSIS — J189 Pneumonia, unspecified organism: Secondary | ICD-10-CM | POA: Diagnosis present

## 2022-02-07 DIAGNOSIS — Z85828 Personal history of other malignant neoplasm of skin: Secondary | ICD-10-CM | POA: Diagnosis not present

## 2022-02-07 DIAGNOSIS — Z87891 Personal history of nicotine dependence: Secondary | ICD-10-CM | POA: Diagnosis not present

## 2022-02-07 DIAGNOSIS — J811 Chronic pulmonary edema: Secondary | ICD-10-CM | POA: Diagnosis present

## 2022-02-07 DIAGNOSIS — R0602 Shortness of breath: Secondary | ICD-10-CM | POA: Diagnosis not present

## 2022-02-07 DIAGNOSIS — L570 Actinic keratosis: Secondary | ICD-10-CM | POA: Diagnosis not present

## 2022-02-07 DIAGNOSIS — R7402 Elevation of levels of lactic acid dehydrogenase (LDH): Secondary | ICD-10-CM | POA: Diagnosis not present

## 2022-02-07 DIAGNOSIS — Z01812 Encounter for preprocedural laboratory examination: Secondary | ICD-10-CM

## 2022-02-07 DIAGNOSIS — D225 Melanocytic nevi of trunk: Secondary | ICD-10-CM | POA: Diagnosis not present

## 2022-02-07 DIAGNOSIS — R7989 Other specified abnormal findings of blood chemistry: Secondary | ICD-10-CM | POA: Diagnosis not present

## 2022-02-07 DIAGNOSIS — Z01810 Encounter for preprocedural cardiovascular examination: Secondary | ICD-10-CM | POA: Diagnosis not present

## 2022-02-07 DIAGNOSIS — I4819 Other persistent atrial fibrillation: Secondary | ICD-10-CM | POA: Diagnosis present

## 2022-02-07 DIAGNOSIS — R001 Bradycardia, unspecified: Secondary | ICD-10-CM | POA: Diagnosis present

## 2022-02-07 DIAGNOSIS — Z79899 Other long term (current) drug therapy: Secondary | ICD-10-CM | POA: Insufficient documentation

## 2022-02-07 DIAGNOSIS — Z96612 Presence of left artificial shoulder joint: Secondary | ICD-10-CM | POA: Diagnosis not present

## 2022-02-07 DIAGNOSIS — N401 Enlarged prostate with lower urinary tract symptoms: Secondary | ICD-10-CM | POA: Diagnosis present

## 2022-02-07 DIAGNOSIS — D2262 Melanocytic nevi of left upper limb, including shoulder: Secondary | ICD-10-CM | POA: Diagnosis not present

## 2022-02-07 DIAGNOSIS — I959 Hypotension, unspecified: Secondary | ICD-10-CM | POA: Diagnosis present

## 2022-02-07 DIAGNOSIS — J90 Pleural effusion, not elsewhere classified: Secondary | ICD-10-CM | POA: Diagnosis not present

## 2022-02-07 DIAGNOSIS — M25512 Pain in left shoulder: Secondary | ICD-10-CM | POA: Diagnosis not present

## 2022-02-07 DIAGNOSIS — I2510 Atherosclerotic heart disease of native coronary artery without angina pectoris: Secondary | ICD-10-CM | POA: Diagnosis present

## 2022-02-07 DIAGNOSIS — R6520 Severe sepsis without septic shock: Secondary | ICD-10-CM | POA: Diagnosis present

## 2022-02-07 DIAGNOSIS — Z20822 Contact with and (suspected) exposure to covid-19: Secondary | ICD-10-CM | POA: Diagnosis present

## 2022-02-07 DIAGNOSIS — L821 Other seborrheic keratosis: Secondary | ICD-10-CM | POA: Diagnosis not present

## 2022-02-07 DIAGNOSIS — Z01818 Encounter for other preprocedural examination: Secondary | ICD-10-CM | POA: Insufficient documentation

## 2022-02-07 DIAGNOSIS — J9601 Acute respiratory failure with hypoxia: Secondary | ICD-10-CM | POA: Diagnosis not present

## 2022-02-07 DIAGNOSIS — Z6837 Body mass index (BMI) 37.0-37.9, adult: Secondary | ICD-10-CM | POA: Diagnosis not present

## 2022-02-07 DIAGNOSIS — A419 Sepsis, unspecified organism: Secondary | ICD-10-CM | POA: Diagnosis present

## 2022-02-07 DIAGNOSIS — Z471 Aftercare following joint replacement surgery: Secondary | ICD-10-CM | POA: Diagnosis not present

## 2022-02-07 DIAGNOSIS — D2261 Melanocytic nevi of right upper limb, including shoulder: Secondary | ICD-10-CM | POA: Diagnosis not present

## 2022-02-07 DIAGNOSIS — Z7901 Long term (current) use of anticoagulants: Secondary | ICD-10-CM | POA: Diagnosis not present

## 2022-02-07 DIAGNOSIS — R4189 Other symptoms and signs involving cognitive functions and awareness: Secondary | ICD-10-CM | POA: Diagnosis not present

## 2022-02-07 DIAGNOSIS — Z86718 Personal history of other venous thrombosis and embolism: Secondary | ICD-10-CM | POA: Diagnosis not present

## 2022-02-07 DIAGNOSIS — M7582 Other shoulder lesions, left shoulder: Secondary | ICD-10-CM | POA: Diagnosis not present

## 2022-02-07 DIAGNOSIS — K5900 Constipation, unspecified: Secondary | ICD-10-CM | POA: Diagnosis not present

## 2022-02-07 DIAGNOSIS — Z8719 Personal history of other diseases of the digestive system: Secondary | ICD-10-CM | POA: Diagnosis not present

## 2022-02-07 DIAGNOSIS — M12812 Other specific arthropathies, not elsewhere classified, left shoulder: Secondary | ICD-10-CM | POA: Diagnosis not present

## 2022-02-07 DIAGNOSIS — D509 Iron deficiency anemia, unspecified: Secondary | ICD-10-CM | POA: Diagnosis present

## 2022-02-07 DIAGNOSIS — J95821 Acute postprocedural respiratory failure: Secondary | ICD-10-CM | POA: Diagnosis present

## 2022-02-07 DIAGNOSIS — E669 Obesity, unspecified: Secondary | ICD-10-CM | POA: Diagnosis present

## 2022-02-07 DIAGNOSIS — J439 Emphysema, unspecified: Secondary | ICD-10-CM | POA: Diagnosis not present

## 2022-02-07 DIAGNOSIS — K449 Diaphragmatic hernia without obstruction or gangrene: Secondary | ICD-10-CM | POA: Diagnosis present

## 2022-02-07 DIAGNOSIS — I119 Hypertensive heart disease without heart failure: Secondary | ICD-10-CM | POA: Diagnosis present

## 2022-02-07 HISTORY — DX: Other obstructive and reflux uropathy: N13.8

## 2022-02-07 HISTORY — DX: Squamous cell carcinoma of skin of unspecified upper limb, including shoulder: C44.621

## 2022-02-07 HISTORY — DX: Cardiac murmur, unspecified: R01.1

## 2022-02-07 HISTORY — DX: Benign prostatic hyperplasia with lower urinary tract symptoms: N40.1

## 2022-02-07 HISTORY — DX: Pneumonia, unspecified organism: J18.9

## 2022-02-07 HISTORY — DX: Gastrointestinal hemorrhage, unspecified: K92.2

## 2022-02-07 LAB — BASIC METABOLIC PANEL
Anion gap: 5 (ref 5–15)
BUN: 25 mg/dL — ABNORMAL HIGH (ref 8–23)
CO2: 30 mmol/L (ref 22–32)
Calcium: 8.9 mg/dL (ref 8.9–10.3)
Chloride: 106 mmol/L (ref 98–111)
Creatinine, Ser: 0.71 mg/dL (ref 0.61–1.24)
GFR, Estimated: >60 mL/min (ref >60)
Glucose, Bld: 114 mg/dL — ABNORMAL HIGH (ref 70–99)
Potassium: 4.2 mmol/L (ref 3.5–5.1)
Sodium: 141 mmol/L (ref 135–145)

## 2022-02-07 LAB — TYPE AND SCREEN
ABO/RH(D): A POS
Antibody Screen: NEGATIVE

## 2022-02-07 LAB — URINALYSIS, ROUTINE W REFLEX MICROSCOPIC
Bilirubin Urine: NEGATIVE
Glucose, UA: NEGATIVE mg/dL
Hgb urine dipstick: NEGATIVE
Ketones, ur: NEGATIVE mg/dL
Leukocytes,Ua: NEGATIVE
Nitrite: NEGATIVE
Protein, ur: NEGATIVE mg/dL
Specific Gravity, Urine: 1.023 (ref 1.005–1.030)
pH: 6 (ref 5.0–8.0)

## 2022-02-07 LAB — SURGICAL PCR SCREEN
MRSA, PCR: NEGATIVE
Staphylococcus aureus: NEGATIVE

## 2022-02-07 NOTE — Patient Instructions (Addendum)
Your procedure is scheduled on:02-14-22 Tuesday Report to the Registration Desk on the 1st floor of the [facility].Then proceed to the 2nd floor Surgery Desk To find out your arrival time, please call [phone] between 1PM - 3PM on:02-13-22 Monday If your arrival time is 6:00 am, do not arrive prior to that time as the [facility] entrance doors do not open until 6:00 am.  REMEMBER: Instructions that are not followed completely may result in serious medical risk, up to and including death; or upon the discretion of your surgeon and anesthesiologist your surgery may need to be rescheduled.  Do not eat food after midnight the night before surgery.  No gum chewing, lozengers or hard candies.  You may however, drink CLEAR liquids up to 2 hours before you are scheduled to arrive for your surgery. Do not drink anything within 2 hours of your scheduled arrival time.  Clear liquids include: - water  - apple juice without pulp - gatorade (not RED colors) - black coffee or tea (Do NOT add milk or creamers to the coffee or tea) Do NOT drink anything that is not on this list.  In addition, your doctor has ordered for you to drink the provided  Ensure Pre-Surgery Clear Carbohydrate Drink  Drinking this carbohydrate drink up to two hours before surgery helps to reduce insulin resistance and improve patient outcomes. Please complete drinking 2 hours prior to scheduled arrival time.  TAKE THESE MEDICATIONS THE MORNING OF SURGERY WITH A SIP OF WATER: -cetirizine (ZYRTEC)  -diltiazem (CARDIZEM CD)  -metoprolol succinate (TOPROL-XL)   One week prior to surgery: Stop Anti-inflammatories (NSAIDS) such as Advil, Aleve, Ibuprofen, Motrin, Naproxen, Naprosyn and Aspirin based products such as Excedrin, Goodys Powder, BC Powder.You may however, take Tylenol if needed for pain up until the day of surgery.  Stop ANY OVER THE COUNTER supplements/vitamins NOW (02-07-22) until after surgery.  No Alcohol  for 24 hours before or after surgery.  No Smoking including e-cigarettes for 24 hours prior to surgery.  No chewable tobacco products for at least 6 hours prior to surgery.  No nicotine patches on the day of surgery.  Do not use any "recreational" drugs for at least a week prior to your surgery.  Please be advised that the combination of cocaine and anesthesia may have negative outcomes, up to and including death. If you test positive for cocaine, your surgery will be cancelled.  On the morning of surgery brush your teeth with toothpaste and water, you may rinse your mouth with mouthwash if you wish. Do not swallow any toothpaste or mouthwash.  Use CHG Soap as directed on instruction sheet/Total Shoulder Arthroplasty:  use Benzolyl Peroxide 5% Gel as directed on instruction sheet.  Do not wear jewelry, make-up, hairpins, clips or nail polish.  Do not wear lotions, powders, or perfumes.   Do not shave body from the neck down 48 hours prior to surgery just in case you cut yourself which could leave a site for infection.  Also, freshly shaved skin may become irritated if using the CHG soap.  Contact lenses, hearing aids and dentures may not be worn into surgery.  Do not bring valuables to the hospital. [facility] is not responsible for any missing/lost belongings or valuables.   Notify your doctor if there is any change in your medical condition (cold, fever, infection).  Wear comfortable clothing (specific to your surgery type) to the hospital.  After surgery, you can help prevent lung complications by doing breathing  exercises.  Take deep breaths and cough every 1-2 hours. Your doctor may order a device called an Incentive Spirometer to help you take deep breaths. When coughing or sneezing, hold a pillow firmly against your incision with both hands. This is called "splinting." Doing this helps protect your incision. It also decreases belly discomfort.  If you are being admitted to  the hospital overnight, leave your suitcase in the car. After surgery it may be brought to your room.  If you are being discharged the day of surgery, you will not be allowed to drive home. You will need a responsible adult (18 years or older) to drive you home and stay with you that night.   If you are taking public transportation, you will need to have a responsible adult (18 years or older) with you. Please confirm with your physician that it is acceptable to use public transportation.   Please call the [facility] at [phone] if you have any questions about these instructions.  Surgery Visitation Policy:  Patients undergoing a surgery or procedure may have two family members or support persons with them as long as the person is not COVID-19 positive or experiencing its symptoms.   Inpatient Visitation:    Visiting hours are 7 a.m. to 8 p.m. Up to four visitors are allowed at one time in a patient room. The visitors may rotate out with other people during the day. One designated support person (adult) may remain overnight.  MASKING: Due to an increase in RSV rates and hospitalizations, starting Wednesday, Nov. 15, in patient care areas in which we serve newborns, infants and children, masks will be required for teammates and visitors.  Children ages 72 and under may not visit. This policy affects the following departments only:  [facility] Newborn nursery/Special care nursery  Other areas: Masks continue to be strongly recommended for [facility] teammates, visitors and patients in all other areas. Visitation is not restricted outside of the units listed above.   How to Use an Incentive Spirometer An incentive spirometer is a tool that measures how well you are filling your lungs with each breath. Learning to take long, deep breaths using this tool can help you keep your lungs clear and active. This  may help to reverse or lessen your chance of developing breathing (pulmonary) problems, especially infection. You may be asked to use a spirometer: After a surgery. If you have a lung problem or a history of smoking. After a long period of time when you have been unable to move or be active. If the spirometer includes an indicator to show the highest number that you have reached, your health care provider or respiratory therapist will help you set a goal. Keep a log of your progress as told by your health care provider. What are the risks? Breathing too quickly may cause dizziness or cause you to pass out. Take your time so you do not get dizzy or light-headed. If you are in pain, you may need to take pain medicine before doing incentive spirometry. It is harder to take a deep breath if you are having pain. How to use your incentive spirometer  Sit up on the edge of your bed or on a chair. Hold the incentive spirometer so that it is in an upright position. Before you use the spirometer, breathe out normally. Place the mouthpiece in your mouth. Make sure your lips are closed tightly around it. Breathe  in slowly and as deeply as you can through your mouth, causing the piston or the ball to rise toward the top of the chamber. Hold your breath for 3-5 seconds, or for as long as possible. If the spirometer includes a coach indicator, use this to guide you in breathing. Slow down your breathing if the indicator goes above the marked areas. Remove the mouthpiece from your mouth and breathe out normally. The piston or ball will return to the bottom of the chamber. Rest for a few seconds, then repeat the steps 10 or more times. Take your time and take a few normal breaths between deep breaths so that you do not get dizzy or light-headed. Do this every 1-2 hours when you are awake. If the spirometer includes a goal marker to show the highest number you have reached (best effort), use this as a goal to  work toward during each repetition. After each set of 10 deep breaths, cough a few times. This will help to make sure that your lungs are clear. If you have an incision on your chest or abdomen from surgery, place a pillow or a rolled-up towel firmly against the incision when you cough. This can help to reduce pain while taking deep breaths and coughing. General tips When you are able to get out of bed: Walk around often. Continue to take deep breaths and cough in order to clear your lungs. Keep using the incentive spirometer until your health care provider says it is okay to stop using it. If you have been in the hospital, you may be told to keep using the spirometer at home. Contact a health care provider if: You are having difficulty using the spirometer. You have trouble using the spirometer as often as instructed. Your pain medicine is not giving enough relief for you to use the spirometer as told. You have a fever. Get help right away if: You develop shortness of breath. You develop a cough with bloody mucus from the lungs. You have fluid or blood coming from an incision site after you cough. Summary An incentive spirometer is a tool that can help you learn to take long, deep breaths to keep your lungs clear and active. You may be asked to use a spirometer after a surgery, if you have a lung problem or a history of smoking, or if you have been inactive for a long period of time. Use your incentive spirometer as instructed every 1-2 hours while you are awake. If you have an incision on your chest or abdomen, place a pillow or a rolled-up towel firmly against your incision when you cough. This will help to reduce pain. Get help right away if you have shortness of breath, you cough up bloody mucus, or blood comes from your incision when you cough. This information is not intended to replace advice given to you by your health care provider. Make sure you discuss any questions you have with  your health care provider. Document Revised: 05/19/2019 Document Reviewed: 05/19/2019 Elsevier Patient Education  [employer].

## 2022-02-08 ENCOUNTER — Encounter: Payer: Self-pay | Admitting: Surgery

## 2022-02-08 DIAGNOSIS — M25512 Pain in left shoulder: Secondary | ICD-10-CM | POA: Diagnosis not present

## 2022-02-08 DIAGNOSIS — Z85828 Personal history of other malignant neoplasm of skin: Secondary | ICD-10-CM | POA: Diagnosis not present

## 2022-02-08 DIAGNOSIS — G8929 Other chronic pain: Secondary | ICD-10-CM | POA: Diagnosis not present

## 2022-02-08 DIAGNOSIS — D225 Melanocytic nevi of trunk: Secondary | ICD-10-CM | POA: Diagnosis not present

## 2022-02-08 DIAGNOSIS — L570 Actinic keratosis: Secondary | ICD-10-CM | POA: Diagnosis not present

## 2022-02-08 DIAGNOSIS — D2262 Melanocytic nevi of left upper limb, including shoulder: Secondary | ICD-10-CM | POA: Diagnosis not present

## 2022-02-08 DIAGNOSIS — D2261 Melanocytic nevi of right upper limb, including shoulder: Secondary | ICD-10-CM | POA: Diagnosis not present

## 2022-02-08 DIAGNOSIS — L821 Other seborrheic keratosis: Secondary | ICD-10-CM | POA: Diagnosis not present

## 2022-02-08 DIAGNOSIS — M75122 Complete rotator cuff tear or rupture of left shoulder, not specified as traumatic: Secondary | ICD-10-CM | POA: Diagnosis not present

## 2022-02-08 DIAGNOSIS — M12812 Other specific arthropathies, not elsewhere classified, left shoulder: Secondary | ICD-10-CM | POA: Diagnosis not present

## 2022-02-08 NOTE — Progress Notes (Signed)
Perioperative Services  Pre-Admission/Anesthesia Testing Clinical Review  Date: 02/08/22  Patient Demographics:  Name: Marcus Coleman DOB:   [birth year] MRN:   [id number]  Planned Surgical Procedure(s):    Case: [id number] Date/Time: 02/09/22 1015   Procedure: REVERSE SHOULDER ARTHROPLASTY AND BICEPS TENODESIS. (Left: Shoulder)   Anesthesia type: Choice   Pre-op diagnosis:      Rotator cuff arthropathy, left M12.812     Nontraumatic complete tear of left rotator cuff M75.122     Rotator cuff tendinitis, left M75.82   Location: [facility] OR ROOM 03 / [place] [facility]   Surgeons: Corky Mull, MD   NOTE: Available PAT nursing documentation and vital signs have been reviewed. Clinical nursing staff has updated patient's PMH/PSHx, current medication list, and drug allergies/intolerances to ensure comprehensive history available to assist in medical decision making as it pertains to the aforementioned surgical procedure and anticipated anesthetic course. Extensive review of available clinical information performed. [place] PMH and PSHx updated with any diagnoses/procedures that  may have been inadvertently omitted during his intake with the pre-admission testing department's nursing staff.  Clinical Discussion:  Marcus Coleman is a 81 y.o. male who is submitted for pre-surgical anesthesia review and clearance prior to him undergoing the above procedure. Patient is a Former Smoker (25 pack years; quit 11/1986). Pertinent PMH includes: CAD, atrial fibrillation, aortic atherosclerosis, cardiomegaly, cardiac murmur, DVT, HTN, HLD, hiatal hernia,  IDA, BPH, nephrolithiasis, cervical DDD, chronic thoracic vertebral compression fractures, LEFT rotator cuff arthropathy ETOH abuse.  Patient is followed by cardiology Saralyn Pilar, MD). He was last seen in the cardiology clinic on 12/01/2021; notes reviewed.  At the time of his clinic, patient reported to be doing "okay".  He denied any episodes  of exertional chest pain, shortness of breath, PND, orthopnea, palpitations, vertiginous symptoms, or presyncope/syncope.  Patient with chronic peripheral edema that was reported to be stable and at baseline.  Patient with complaints of back, neck, and shoulder pain.  Patient with a past medical history significant for cardiovascular diagnoses.  Most recent TTE was performed on 07/19/2020 revealing a low normal left ventricular systolic function with an EF of 50%.  Left atrium was mildly enlarged.  Right ventricular size and function normal.  There was mild mitral, tricuspid, and pulmonary valve regurgitation.  There was no evidence of a significant transvalvular gradient to suggest stenosis.  Patient with an atrial fibrillation diagnosis; CHA2DS2-VASc Score = 6 (age x 2, HTN, DVT x 2, vascular disease history). His rate and rhythm are currently being maintained on oral diltiazem + metoprolol succinate. He is currently off of chronic anticoagulation.  Medication was discontinued secondary to patient with significant GI bleed requiring PRBC transfusion.  Blood pressure well-controlled at 126/78 mmHg on currently prescribed diuretic (furosemide), beta-blocker (metoprolol succinate), and CCB (diltiazem) therapies.  Patient is not currently taking any type of lipid-lowering therapies for his HLD diagnosis and further ASCVD prevention.  He is not diabetic.  Patient does not have an OSAH diagnosis.  Functional capacity limited by age, arthritides, and multiple medical comorbidities.  With that being said, patient still felt to be able to achieve at least 4 METS of physical activity without experiencing any degree of significant angina/anginal equivalent symptoms.  No changes were made to his medication regimen.  Patient to follow-up with outpatient cardiology and 3 months or sooner if needed.  Marcus Coleman is scheduled for an elective LEFT REVERSE SHOULDER ARTHROPLASTY AND BICEPS TENODESIS on 02/09/2022 with Dr.  Jenny Reichmann  Poggi, MD.  Given patient's past medical history significant for cardiovascular diagnoses, presurgical cardiac clearance was sought by the PAT team. Per cardiology, "this patient is optimized for surgery and may proceed with the planned procedural course with a LOW risk of significant perioperative cardiovascular complications". In review of his medication reconciliation, the patient is not noted to be taking any type of anticoagulation or antiplatelet therapies that would need to be held during his perioperative course.  Patient denies previous perioperative complications with anesthesia in the past. In review of the available records, it is noted that patient underwent a MAC anesthetic course at [facility]  (ASA II) in 07/2019 without documented complications.      02/07/2022   11:00 AM 01/13/2022    8:59 AM 12/29/2021    1:36 PM  Vitals with BMI  Height _0  _1  _2   Weight 208 lbs 12 oz 201 lbs 201 lbs  BMI 33.71 08.67 61.95  Systolic 093 267 124  Diastolic 80 99 94  Pulse 68 137 98    Providers/Specialists:   NOTE: Primary physician provider listed below. Patient may have been seen by APP or partner within same practice.   PROVIDER ROLE / SPECIALTY LAST OV  Poggi, Marshall Cork, MD Orthopedics (Surgeon) 12/26/2021  Idelle Crouch, MD Primary Care Provider 01/27/2022  Isaias Cowman, MD Cardiology 12/01/2021   Allergies:  Nsaids  Current Home Medications:   No current facility-administered medications for this encounter.    acetaminophen (TYLENOL) 650 MG CR tablet   cetirizine (ZYRTEC) 10 MG tablet   clindamycin (CLEOCIN T) 1 % external solution   dilTIAZem HCl ER 300 MG TB24   furosemide (LASIX) 20 MG tablet   metoprolol succinate (TOPROL-XL) 50 MG 24 hr tablet   timolol (TIMOPTIC) 0.5 % ophthalmic solution   History:   Past Medical History:  Diagnosis Date   Anemia    Aortic atherosclerosis (HCC)    Atrial fibrillation ([place])    a.)  CHA2DS2VASc = 6 (age x2, HTN, DVT x2, vascular disease history);  b.) rate/rhythm maintained on oral diltiazem + metoprolol succinate; chronic anticoagulation (apixaban) discontinued due to GI bleeding   BPH with obstruction/lower urinary tract symptoms    Bronchitis 01/27/2022   CAD (coronary artery disease)    Cardiomegaly    Chronic diarrhea    Compression fracture of thoracic vertebra ([place])    a.) CT renal 12/30/2021: chronic appearing T11-T12 (70% height loss)   DDD (degenerative disc disease), cervical    Diverticulosis    DVT (deep venous thrombosis) ([place]) 2021   a.) anticoagulation intiated (apixaban), however subsequently discontinued due to GI bleeding   ETOH abuse    GI bleed    Heart murmur    History of hiatal hernia    History of kidney stones    Hyperlipidemia    Hypertension    IDA (iron deficiency anemia)    Low testosterone    Pneumonia    Rotator cuff arthropathy, left    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    Vasovagal syncope    Past Surgical History:  Procedure Laterality Date   boils removed from right forearm  [place] EXTRACTION Left 2008   catracts Bilateral    left 2008, right 2013   CHOLECYSTECTOMY     COLONOSCOPY WITH PROPOFOL N/A 06/26/2016   Procedure: COLONOSCOPY WITH PROPOFOL;  Surgeon: Manya Silvas, MD;  Location: [facility] ENDOSCOPY;  Service:  Endoscopy;  Laterality: N/A;   COLONOSCOPY WITH PROPOFOL N/A 07/03/2019   Procedure: COLONOSCOPY WITH PROPOFOL;  Surgeon: [name], MD;  Location: [facility] ENDOSCOPY;  Service: Gastroenterology;  Laterality: N/A;   ENDOSCOPIC RETROGRADE CHOLANGIOPANCREATOGRAPHY (ERCP) WITH PROPOFOL     ENDOSCOPIC VEIN LASER TREATMENT     right leg 05/2015, left leg 09/2015   ESOPHAGOGASTRODUODENOSCOPY N/A 04/19/2019   Procedure: ESOPHAGOGASTRODUODENOSCOPY (EGD);  Surgeon: [name], MD;  Location: [facility] ENDOSCOPY;  Service: Gastroenterology;  Laterality: N/A;    ESOPHAGOGASTRODUODENOSCOPY (EGD) WITH PROPOFOL N/A 07/03/2019   Procedure: ESOPHAGOGASTRODUODENOSCOPY (EGD) WITH PROPOFOL;  Surgeon: [name], MD;  Location: [facility] ENDOSCOPY;  Service: Gastroenterology;  Laterality: N/A;   ESOPHAGOGASTRODUODENOSCOPY (EGD) WITH PROPOFOL N/A 08/07/2019   Procedure: ESOPHAGOGASTRODUODENOSCOPY (EGD) WITH PROPOFOL;  Surgeon: Milus Banister, MD;  Location: [facility] ENDOSCOPY;  Service: Endoscopy;  Laterality: N/A;   EUS N/A 08/07/2019   Procedure: UPPER ENDOSCOPIC ULTRASOUND (EUS) RADIAL;  Surgeon: Milus Banister, MD;  Location: [facility] ENDOSCOPY;  Service: Endoscopy;  Laterality: N/A;   eyelid surgery     FOOT SURGERY Right 2009   HEMORROIDECTOMY     HERNIA REPAIR     mrercp  2014   NOSE SURGERY  2011   RHINOPLASTY     squamous cell carcinoma removed from right hand  2013   TONSILLECTOMY     WISDOM TOOTH EXTRACTION     Family History  Problem Relation Age of Onset   Hypothyroidism Mother    Diabetes Mother    Lung cancer Mother    Social History   Tobacco Use   Smoking status: Former    Packs/day: 1.00    Years: 25.00    Total pack years: 25.00    Types: Cigarettes    Quit date: 12/05/1986    Years since quitting: 35.2   Smokeless tobacco: Never  Vaping Use   Vaping Use: Never used  Substance Use Topics   Alcohol use: Yes    Alcohol/week: 14.0 standard drinks of alcohol    Types: 14 Cans of beer per week    Comment: 2 beers per dayOR  a couple glasses of wine   Drug use: No    Pertinent Clinical Results:  LABS: Labs reviewed: Acceptable for surgery.  Hospital Outpatient Visit on 02/07/2022  Component Date Value Ref Range Status   MRSA, PCR 02/07/2022 NEGATIVE  NEGATIVE Final   Staphylococcus aureus 02/07/2022 NEGATIVE  NEGATIVE Final   Comment: (NOTE) The Xpert SA Assay (FDA approved for NASAL specimens in patients 47 years of age and older), is one component of a comprehensive surveillance program. It is not intended to diagnose infection  nor to guide or monitor treatment. Performed at [facility], [address] [place],  [address]    Sodium 02/07/2022 141  135 - 145 mmol/L Final   Potassium 02/07/2022 4.2  3.5 - 5.1 mmol/L Final   Chloride 02/07/2022 106  98 - 111 mmol/L Final   CO2 02/07/2022 30  22 - 32 mmol/L Final   Glucose, Bld 02/07/2022 114 (H)  70 - 99 mg/dL Final   Glucose reference range applies only to samples taken after fasting for at least 8 hours.   BUN 02/07/2022 25 (H)  8 - 23 mg/dL Final   Creatinine, Ser 02/07/2022 0.71  0.61 - 1.24 mg/dL Final   Calcium 02/07/2022 8.9  8.9 - 10.3 mg/dL Final   GFR, Estimated 02/07/2022 >60  >60 mL/min Final   Comment: (NOTE) Calculated using the CKD-EPI Creatinine Equation (  2021)    Anion gap 02/07/2022 5  5 - 15 Final   Performed at [facility], [place], [place] [address]   Color, Urine 02/07/2022 YELLOW (A)  YELLOW Final   APPearance 02/07/2022 CLEAR (A)  CLEAR Final   Specific Gravity, Urine 02/07/2022 1.023  1.005 - 1.030 Final   pH 02/07/2022 6.0  5.0 - 8.0 Final   Glucose, UA 02/07/2022 NEGATIVE  NEGATIVE mg/dL Final   Hgb urine dipstick 02/07/2022 NEGATIVE  NEGATIVE Final   Bilirubin Urine 02/07/2022 NEGATIVE  NEGATIVE Final   Ketones, ur 02/07/2022 NEGATIVE  NEGATIVE mg/dL Final   Protein, ur 02/07/2022 NEGATIVE  NEGATIVE mg/dL Final   Nitrite 02/07/2022 NEGATIVE  NEGATIVE Final   Leukocytes,Ua 02/07/2022 NEGATIVE  NEGATIVE Final   Performed at [facility], [address] [place], [place] [address]   ABO/RH(D) 02/07/2022 A POS   Final   Antibody Screen 02/07/2022 NEG   Final   Sample Expiration 02/07/2022 02/21/2022,2359   Final   Extend sample reason 02/07/2022    Final                   Value:NO TRANSFUSIONS OR PREGNANCY IN THE PAST 3 MONTHS Performed at [facility], [address] [place], [place] [address]   Procedure visit on 01/13/2022  Component Date Value Ref Range Status    Specific Gravity, UA 01/13/2022 1.030  1.005 - 1.030 Final   pH, UA 01/13/2022 5.0  5.0 - 7.5 Final   Color, UA 01/13/2022 Yellow  Yellow Final   Appearance Ur 01/13/2022 Clear  Clear Final   Leukocytes,UA 01/13/2022 Negative  Negative Final   Protein,UA 01/13/2022 2+ (A)  Negative/Trace Final   Glucose, UA 01/13/2022 Negative  Negative Final   Ketones, UA 01/13/2022 Trace (A)  Negative Final   RBC, UA 01/13/2022 Trace (A)  Negative Final   Bilirubin, UA 01/13/2022 Negative  Negative Final   Urobilinogen, Ur 01/13/2022 1.0  0.2 - 1.0 mg/dL Final   Nitrite, UA 01/13/2022 Negative  Negative Final   Microscopic Examination 01/13/2022 See below:   Final   WBC, UA 01/13/2022 0-5  0 - 5 /hpf Final   RBC, Urine 01/13/2022 3-10 (A)  0 - 2 /hpf Final   Epithelial Cells (non renal) 01/13/2022 0-10  0 - 10 /hpf Final   Mucus, UA 01/13/2022 Present (A)  Not Estab. Final   Bacteria, UA 01/13/2022 Moderate (A)  None seen/Few Final  Appointment on 12/26/2021  Component Date Value Ref Range Status   Vitamin B-12 12/26/2021 334  180 - 914 pg/mL Final   Comment: (NOTE) This assay is not validated for testing neonatal or myeloproliferative syndrome specimens for Vitamin B12 levels. Performed at [facility], [place] [address], [place], [place] [address]    Ferritin 12/26/2021 17 (L)  24 - 336 ng/mL Final   Performed at [facility], [place], [place] [address]   Sodium 12/26/2021 141  135 - 145 mmol/L Final   Potassium 12/26/2021 4.4  3.5 - 5.1 mmol/L Final   Chloride 12/26/2021 106  98 - 111 mmol/L Final   CO2 12/26/2021 30  22 - 32 mmol/L Final   Glucose, Bld 12/26/2021 105 (H)  70 - 99 mg/dL Final   Glucose reference range applies only to samples taken after fasting for at least 8 hours.   BUN 12/26/2021 22  8 - 23 mg/dL Final   Creatinine, Ser 12/26/2021 0.77  0.61 - 1.24 mg/dL Final   Calcium 12/26/2021 8.6 (L)  8.9 - 10.3 mg/dL Final   Total Protein 12/26/2021  6.7  6.5 - 8.1 g/dL Final   Albumin 12/26/2021 3.9  3.5 - 5.0 g/dL Final   AST 12/26/2021 18  15 - 41 U/L Final   ALT 12/26/2021 14  0 - 44 U/L Final   Alkaline Phosphatase 12/26/2021 80  38 - 126 U/L Final   Total Bilirubin 12/26/2021 0.7  0.3 - 1.2 mg/dL Final   GFR, Estimated 12/26/2021 >60  >60 mL/min Final   Comment: (NOTE) Calculated using the CKD-EPI Creatinine Equation (2021)    Anion gap 12/26/2021 5  5 - 15 Final   Performed at [facility], [address] [place], [place] [address]   WBC 12/26/2021 5.3  4.0 - 10.5 K/uL Final   RBC 12/26/2021 3.73 (L)  4.22 - 5.81 MIL/uL Final   Hemoglobin 12/26/2021 11.8 (L)  13.0 - 17.0 g/dL Final   HCT 12/26/2021 36.6 (L)  39.0 - 52.0 % Final   MCV 12/26/2021 98.1  80.0 - 100.0 fL Final   MCH 12/26/2021 31.6  26.0 - 34.0 pg Final   MCHC 12/26/2021 32.2  30.0 - 36.0 g/dL Final   RDW 12/26/2021 16.1 (H)  11.5 - 15.5 % Final   Platelets 12/26/2021 220  150 - 400 K/uL Final   nRBC 12/26/2021 0.0  0.0 - 0.2 % Final   Neutrophils Relative % 12/26/2021 65  % Final   Neutro Abs 12/26/2021 3.4  1.7 - 7.7 K/uL Final   Lymphocytes Relative 12/26/2021 20  % Final   Lymphs Abs 12/26/2021 1.1  0.7 - 4.0 K/uL Final   Monocytes Relative 12/26/2021 11  % Final   Monocytes Absolute 12/26/2021 0.6  0.1 - 1.0 K/uL Final   Eosinophils Relative 12/26/2021 3  % Final   Eosinophils Absolute 12/26/2021 0.2  0.0 - 0.5 K/uL Final   Basophils Relative 12/26/2021 1  % Final   Basophils Absolute 12/26/2021 0.0  0.0 - 0.1 K/uL Final   Immature Granulocytes 12/26/2021 0  % Final   Abs Immature Granulocytes 12/26/2021 0.01  0.00 - 0.07 K/uL Final   Performed at [facility], [address] [place], [place] [address]   Iron 12/26/2021 31 (L)  45 - 182 ug/dL Final   TIBC 12/26/2021 465 (H)  250 - 450 ug/dL Final   Saturation Ratios 12/26/2021 7 (L)  17.9 - 39.5 % Final   UIBC 12/26/2021 434  ug/dL Final   Performed at [facility], [address] [place], [place] [address]    ECG: Date: 02/07/2022 Time ECG obtained: 1215 PM Rate: 75 bpm Rhythm: atrial fibrillation Axis (leads I and aVF): Normal Intervals: QRS 80 ms. QTc 446 ms. ST segment and T wave changes: No evidence of acute ST segment elevation or depression.  Evidence of an age undetermined anterior infarct present Comparison: Previous tracing obtained on 04/17/2019 showed normal sinus rhythm at a rate of 81 bpm.   IMAGING / PROCEDURES: CT SHOULDER LEFT WO CONTRAST performed on 12/30/2021 Mild osteoarthritis of the glenohumeral joint. Loss of the normal acromiohumeral distance with a bone-on-bone appearance as can be seen with a chronic rotator cuff tear. Mild atrophy of the rotator cuff muscles. Chronic supraspinatus and infraspinatus tendon tears Aortic atherosclerosis Partially visualized cervicothoracic spondylosis.  CT HEMATURIA WORKUP performed on 12/30/2021 5 mm nonobstructive calculus in the upper pole collecting system of the left kidney. No  ureteral stones or findings of urinary tract obstruction are noted at this time. Large hiatal hernia. Colonic diverticulosis without evidence of acute diverticulitis at this time. Cardiomegaly with biatrial dilatation. Aortic atherosclerosis, in addition to three-vessel coronary artery disease. Please note that although the presence of coronary artery calcium documents the presence of coronary artery disease, the severity of this disease and any potential stenosis cannot be assessed on this non-gated CT examination. Assessment for potential risk factor modification, dietary therapy or pharmacologic therapy may be warranted, if clinically indicated.  TRANSTHORACIC ECHOCARDIOGRAM performed on 07/19/2020 Low normal left ventricular systolic function with an EF of 50% Normal left ventricular diastolic Doppler parameters Mild biatrial enlargement Normal right ventricular systolic function Mild MR, TR, PR No  AR Normal transvalvular gradients; no valvular stenosis No pericardial effusion   Impression and Plan:  SHAKUR LEMBO has been referred for pre-anesthesia review and clearance prior to him undergoing the planned anesthetic and procedural courses. Available labs, pertinent testing, and imaging results were personally reviewed by me. This patient has been appropriately cleared by cardiology with an overall LOW risk of significant perioperative cardiovascular complications.  Based on clinical review performed today (02/08/22), barring any significant acute changes in the patient's overall condition, it is anticipated that he will be able to proceed with the planned surgical intervention. Any acute changes in clinical condition may necessitate his procedure being postponed and/or cancelled. Patient will meet with anesthesia team (MD and/or CRNA) on the day of his procedure for preoperative evaluation/assessment. Questions regarding anesthetic course will be fielded at that time.   Pre-surgical instructions were reviewed with the patient during his PAT appointment and questions were fielded by PAT clinical staff. Patient was advised that if any questions or concerns arise prior to his procedure then he should return a call to PAT and/or his surgeon's office to discuss.  Honor Loh, MSN, APRN, FNP-C, CEN [facility]  Peri-operative Services Nurse Practitioner Phone: [phone] Fax: [phone] 02/08/22 10:22 AM  NOTE: This note has been prepared using Dragon dictation software. Despite my best ability to proofread, there is always the potential that unintentional transcriptional errors may still occur from this process.

## 2022-02-09 ENCOUNTER — Other Ambulatory Visit: Payer: Self-pay

## 2022-02-09 ENCOUNTER — Ambulatory Visit: Payer: No Typology Code available for payment source | Admitting: Urgent Care

## 2022-02-09 ENCOUNTER — Encounter
Admission: RE | Disposition: A | Discharge status: Home / Self Care | Payer: Self-pay | Source: Home / Self Care | Attending: Surgery

## 2022-02-09 ENCOUNTER — Ambulatory Visit: Payer: No Typology Code available for payment source

## 2022-02-09 ENCOUNTER — Encounter: Payer: Self-pay | Admitting: Surgery

## 2022-02-09 ENCOUNTER — Ambulatory Visit
Admission: RE | Admit: 2022-02-09 | Discharge: 2022-02-09 | Disposition: A | Discharge status: Home / Self Care | Payer: No Typology Code available for payment source | Source: Home / Self Care | Attending: Surgery | Admitting: Surgery

## 2022-02-09 DIAGNOSIS — D759 Disease of blood and blood-forming organs, unspecified: Secondary | ICD-10-CM | POA: Insufficient documentation

## 2022-02-09 DIAGNOSIS — Z471 Aftercare following joint replacement surgery: Secondary | ICD-10-CM | POA: Diagnosis not present

## 2022-02-09 DIAGNOSIS — Z96612 Presence of left artificial shoulder joint: Secondary | ICD-10-CM | POA: Diagnosis not present

## 2022-02-09 DIAGNOSIS — Z87891 Personal history of nicotine dependence: Secondary | ICD-10-CM | POA: Insufficient documentation

## 2022-02-09 DIAGNOSIS — M75122 Complete rotator cuff tear or rupture of left shoulder, not specified as traumatic: Secondary | ICD-10-CM | POA: Insufficient documentation

## 2022-02-09 DIAGNOSIS — M12812 Other specific arthropathies, not elsewhere classified, left shoulder: Secondary | ICD-10-CM | POA: Diagnosis not present

## 2022-02-09 DIAGNOSIS — M7582 Other shoulder lesions, left shoulder: Secondary | ICD-10-CM | POA: Diagnosis not present

## 2022-02-09 DIAGNOSIS — Z79899 Other long term (current) drug therapy: Secondary | ICD-10-CM | POA: Insufficient documentation

## 2022-02-09 DIAGNOSIS — I2510 Atherosclerotic heart disease of native coronary artery without angina pectoris: Secondary | ICD-10-CM | POA: Insufficient documentation

## 2022-02-09 DIAGNOSIS — I4819 Other persistent atrial fibrillation: Secondary | ICD-10-CM | POA: Insufficient documentation

## 2022-02-09 DIAGNOSIS — D649 Anemia, unspecified: Secondary | ICD-10-CM | POA: Insufficient documentation

## 2022-02-09 DIAGNOSIS — M19012 Primary osteoarthritis, left shoulder: Secondary | ICD-10-CM | POA: Insufficient documentation

## 2022-02-09 DIAGNOSIS — I10 Essential (primary) hypertension: Secondary | ICD-10-CM | POA: Insufficient documentation

## 2022-02-09 HISTORY — DX: Alcohol abuse, uncomplicated: F10.10

## 2022-02-09 HISTORY — PX: REVERSE SHOULDER ARTHROPLASTY: SHX5054

## 2022-02-09 HISTORY — DX: Wedge compression fracture of unspecified thoracic vertebra, initial encounter for closed fracture: S22.000A

## 2022-02-09 HISTORY — DX: Cardiomegaly: I51.7

## 2022-02-09 HISTORY — DX: Atherosclerosis of aorta: I70.0

## 2022-02-09 HISTORY — DX: Calculus of kidney: N20.0

## 2022-02-09 HISTORY — DX: Diverticulosis of intestine, part unspecified, without perforation or abscess without bleeding: K57.90

## 2022-02-09 HISTORY — DX: Atherosclerotic heart disease of native coronary artery without angina pectoris: I25.10

## 2022-02-09 HISTORY — DX: Other specific arthropathies, not elsewhere classified, left shoulder: M12.812

## 2022-02-09 HISTORY — DX: Diaphragmatic hernia without obstruction or gangrene: K44.9

## 2022-02-09 SURGERY — ARTHROPLASTY, SHOULDER, TOTAL, REVERSE
Anesthesia: Regional | Site: Shoulder | Laterality: Left

## 2022-02-09 MED ORDER — SODIUM CHLORIDE FLUSH 0.9 % IV SOLN
INTRAVENOUS | Status: AC
  Filled 2022-02-09: qty 20

## 2022-02-09 MED ORDER — BUPIVACAINE LIPOSOME 1.3 % IJ SUSP
INTRAMUSCULAR | Status: DC | PRN
  Administered 2022-02-09: 10 mL

## 2022-02-09 MED ORDER — SODIUM CHLORIDE 0.9 % IR SOLN
Status: DC | PRN
  Administered 2022-02-09: 3000 mL

## 2022-02-09 MED ORDER — ONDANSETRON HCL 4 MG PO TABS: 4.0000 mg | ORAL_TABLET | Freq: Four times a day (QID) | ORAL | Status: DC | PRN

## 2022-02-09 MED ORDER — ROCURONIUM BROMIDE 10 MG/ML (PF) SYRINGE
PREFILLED_SYRINGE | INTRAVENOUS | Status: AC
  Filled 2022-02-09: qty 20

## 2022-02-09 MED ORDER — LIDOCAINE HCL (CARDIAC) PF 100 MG/5ML IV SOSY
PREFILLED_SYRINGE | INTRAVENOUS | Status: DC | PRN
  Administered 2022-02-09: 60 mg via INTRAVENOUS

## 2022-02-09 MED ORDER — METOCLOPRAMIDE HCL 5 MG/ML IJ SOLN: 5.0000 mg | Freq: Three times a day (TID) | INTRAMUSCULAR | Status: DC | PRN

## 2022-02-09 MED ORDER — BUPIVACAINE HCL (PF) 0.5 % IJ SOLN
INTRAMUSCULAR | Status: AC
  Filled 2022-02-09: qty 20

## 2022-02-09 MED ORDER — FAMOTIDINE 20 MG PO TABS: 20.0000 mg | ORAL_TABLET | Freq: Once | ORAL | Status: AC

## 2022-02-09 MED ORDER — CEFAZOLIN SODIUM-DEXTROSE 2-4 GM/100ML-% IV SOLN
INTRAVENOUS | Status: AC
  Administered 2022-02-09: 2 g via INTRAVENOUS
  Filled 2022-02-09: qty 100

## 2022-02-09 MED ORDER — ONDANSETRON HCL 4 MG/2ML IJ SOLN: 4.0000 mg | Freq: Once | INTRAMUSCULAR | Status: DC | PRN

## 2022-02-09 MED ORDER — HYDROCODONE-ACETAMINOPHEN 5-325 MG PO TABS: 1.0000 | ORAL_TABLET | Freq: Four times a day (QID) | ORAL | 0 refills | Status: DC | PRN

## 2022-02-09 MED ORDER — BUPIVACAINE LIPOSOME 1.3 % IJ SUSP
INTRAMUSCULAR | Status: AC
  Filled 2022-02-09: qty 20

## 2022-02-09 MED ORDER — LIDOCAINE HCL (PF) 1 % IJ SOLN
INTRAMUSCULAR | Status: AC
  Filled 2022-02-09: qty 5

## 2022-02-09 MED ORDER — CEFAZOLIN SODIUM-DEXTROSE 2-4 GM/100ML-% IV SOLN: 2.0000 g | Freq: Four times a day (QID) | INTRAVENOUS | Status: DC

## 2022-02-09 MED ORDER — MIDAZOLAM HCL 2 MG/2ML IJ SOLN
INTRAMUSCULAR | Status: AC
  Administered 2022-02-09: 1 mg via INTRAVENOUS
  Filled 2022-02-09: qty 2

## 2022-02-09 MED ORDER — FENTANYL CITRATE (PF) 100 MCG/2ML IJ SOLN
INTRAMUSCULAR | Status: AC
  Filled 2022-02-09: qty 2

## 2022-02-09 MED ORDER — DEXAMETHASONE SODIUM PHOSPHATE 10 MG/ML IJ SOLN
INTRAMUSCULAR | Status: DC | PRN
  Administered 2022-02-09: 10 mg via INTRAVENOUS

## 2022-02-09 MED ORDER — CEFAZOLIN SODIUM-DEXTROSE 2-4 GM/100ML-% IV SOLN
2.0000 g | INTRAVENOUS | Status: AC
  Administered 2022-02-09: 2 g via INTRAVENOUS

## 2022-02-09 MED ORDER — ONDANSETRON HCL 4 MG/2ML IJ SOLN
INTRAMUSCULAR | Status: DC | PRN
  Administered 2022-02-09: 4 mg via INTRAVENOUS

## 2022-02-09 MED ORDER — SODIUM CHLORIDE 0.9 % IV SOLN: INTRAVENOUS | Status: DC

## 2022-02-09 MED ORDER — FENTANYL CITRATE PF 50 MCG/ML IJ SOSY
PREFILLED_SYRINGE | INTRAMUSCULAR | Status: AC
  Filled 2022-02-09: qty 2

## 2022-02-09 MED ORDER — ROCURONIUM BROMIDE 10 MG/ML (PF) SYRINGE
PREFILLED_SYRINGE | INTRAVENOUS | Status: AC
  Filled 2022-02-09: qty 10

## 2022-02-09 MED ORDER — BUPIVACAINE-EPINEPHRINE (PF) 0.5% -1:200000 IJ SOLN
INTRAMUSCULAR | Status: AC
  Filled 2022-02-09: qty 30

## 2022-02-09 MED ORDER — CEFAZOLIN SODIUM-DEXTROSE 2-4 GM/100ML-% IV SOLN
INTRAVENOUS | Status: AC
  Filled 2022-02-09: qty 100

## 2022-02-09 MED ORDER — TRANEXAMIC ACID 1000 MG/10ML IV SOLN
INTRAVENOUS | Status: AC
  Filled 2022-02-09: qty 10

## 2022-02-09 MED ORDER — LIDOCAINE HCL (PF) 1 % IJ SOLN
INTRAMUSCULAR | Status: DC | PRN
  Administered 2022-02-09: 3 mL via SUBCUTANEOUS

## 2022-02-09 MED ORDER — PROPOFOL 10 MG/ML IV BOLUS
INTRAVENOUS | Status: DC | PRN
  Administered 2022-02-09: 100 mg via INTRAVENOUS

## 2022-02-09 MED ORDER — MIDAZOLAM HCL 2 MG/2ML IJ SOLN: 2.0000 mg | Freq: Once | INTRAMUSCULAR | Status: AC

## 2022-02-09 MED ORDER — HYDROCODONE-ACETAMINOPHEN 5-325 MG PO TABS: 1.0000 | ORAL_TABLET | ORAL | Status: DC | PRN

## 2022-02-09 MED ORDER — BUPIVACAINE-EPINEPHRINE (PF) 0.5% -1:200000 IJ SOLN
INTRAMUSCULAR | Status: DC | PRN
  Administered 2022-02-09: 40 mL

## 2022-02-09 MED ORDER — LIDOCAINE HCL (PF) 2 % IJ SOLN
INTRAMUSCULAR | Status: AC
  Filled 2022-02-09: qty 5

## 2022-02-09 MED ORDER — ROCURONIUM BROMIDE 100 MG/10ML IV SOLN
INTRAVENOUS | Status: DC | PRN
  Administered 2022-02-09: 20 mg via INTRAVENOUS
  Administered 2022-02-09: 50 mg via INTRAVENOUS

## 2022-02-09 MED ORDER — METOPROLOL TARTRATE 5 MG/5ML IV SOLN
INTRAVENOUS | Status: AC
  Filled 2022-02-09: qty 5

## 2022-02-09 MED ORDER — BUPIVACAINE HCL (PF) 0.5 % IJ SOLN
INTRAMUSCULAR | Status: DC | PRN
  Administered 2022-02-09: 20 mL via PERINEURAL

## 2022-02-09 MED ORDER — PROPOFOL 10 MG/ML IV BOLUS
INTRAVENOUS | Status: AC
  Filled 2022-02-09: qty 40

## 2022-02-09 MED ORDER — ONDANSETRON HCL 4 MG/2ML IJ SOLN: 4.0000 mg | Freq: Four times a day (QID) | INTRAMUSCULAR | Status: DC | PRN

## 2022-02-09 MED ORDER — ORAL CARE MOUTH RINSE: 15.0000 mL | Freq: Once | OROMUCOSAL | Status: AC

## 2022-02-09 MED ORDER — FENTANYL CITRATE (PF) 100 MCG/2ML IJ SOLN: 25.0000 ug | INTRAMUSCULAR | Status: DC | PRN

## 2022-02-09 MED ORDER — CHLORHEXIDINE GLUCONATE 0.12 % MT SOLN
OROMUCOSAL | Status: AC
  Administered 2022-02-09: 15 mL via OROMUCOSAL
  Filled 2022-02-09: qty 15

## 2022-02-09 MED ORDER — LACTATED RINGERS IV SOLN: INTRAVENOUS | Status: DC

## 2022-02-09 MED ORDER — FAMOTIDINE 20 MG PO TABS
ORAL_TABLET | ORAL | Status: AC
  Administered 2022-02-09: 20 mg via ORAL
  Filled 2022-02-09: qty 1

## 2022-02-09 MED ORDER — SUGAMMADEX SODIUM 200 MG/2ML IV SOLN
INTRAVENOUS | Status: DC | PRN
  Administered 2022-02-09: 200 mg via INTRAVENOUS

## 2022-02-09 MED ORDER — CHLORHEXIDINE GLUCONATE 0.12 % MT SOLN: 15.0000 mL | Freq: Once | OROMUCOSAL | Status: AC

## 2022-02-09 MED ORDER — BUPIVACAINE LIPOSOME 1.3 % IJ SUSP
INTRAMUSCULAR | Status: AC
  Filled 2022-02-09: qty 10

## 2022-02-09 MED ORDER — TRANEXAMIC ACID 1000 MG/10ML IV SOLN
INTRAVENOUS | Status: DC | PRN
  Administered 2022-02-09: 1000 mg via TOPICAL

## 2022-02-09 MED ORDER — METOPROLOL TARTRATE 5 MG/5ML IV SOLN
INTRAVENOUS | Status: DC | PRN
  Administered 2022-02-09 (×2): 5 mg via INTRAVENOUS

## 2022-02-09 MED ORDER — PHENYLEPHRINE HCL-NACL 20-0.9 MG/250ML-% IV SOLN
INTRAVENOUS | Status: DC | PRN
  Administered 2022-02-09: 30 ug/min via INTRAVENOUS

## 2022-02-09 MED ORDER — PHENYLEPHRINE HCL-NACL 20-0.9 MG/250ML-% IV SOLN
INTRAVENOUS | Status: AC
  Filled 2022-02-09: qty 250

## 2022-02-09 MED ORDER — FENTANYL CITRATE (PF) 100 MCG/2ML IJ SOLN
INTRAMUSCULAR | Status: DC | PRN
  Administered 2022-02-09 (×3): 50 ug via INTRAVENOUS

## 2022-02-09 MED ORDER — 0.9 % SODIUM CHLORIDE (POUR BTL) OPTIME
TOPICAL | Status: DC | PRN
  Administered 2022-02-09: 500 mL

## 2022-02-09 MED ORDER — METOCLOPRAMIDE HCL 10 MG PO TABS: 5.0000 mg | ORAL_TABLET | Freq: Three times a day (TID) | ORAL | Status: DC | PRN

## 2022-02-09 SURGICAL SUPPLY — 70 items
APL PRP STRL LF DISP 70% ISPRP (MISCELLANEOUS) ×1
BASEPLATE MOD 24 (Plate) IMPLANT
BIT DRILL FLUTED 3.0 STRL (BIT) IMPLANT
BLADE SAW SAG 25X90X1.19 (BLADE) ×1 IMPLANT
BSPLAT GLND 24 MDLR STRL LF (Plate) ×1 IMPLANT
CALIBRATOR GLENOID VIP 5-D (SYSTAGENIX WOUND MANAGEMENT) IMPLANT
CHLORAPREP W/TINT 26 (MISCELLANEOUS) ×1 IMPLANT
COOLER POLAR GLACIER W/PUMP (MISCELLANEOUS) ×1 IMPLANT
COVER BACK TABLE REUSABLE LG (DRAPES) ×1 IMPLANT
CUP SUT UNIV REVERS 39 NEU (Shoulder) IMPLANT
DRAPE 3/4 80X56 (DRAPES) ×1 IMPLANT
DRAPE INCISE IOBAN 66X45 STRL (DRAPES) ×1 IMPLANT
DRSG OPSITE POSTOP 4X8 (GAUZE/BANDAGES/DRESSINGS) ×1 IMPLANT
ELECT BLADE 6.5 EXT (BLADE) IMPLANT
ELECT CAUTERY BLADE 6.4 (BLADE) ×1 IMPLANT
ELECT REM PT RETURN 9FT ADLT (ELECTROSURGICAL) ×1
ELECTRODE REM PT RTRN 9FT ADLT (ELECTROSURGICAL) ×1 IMPLANT
GAUZE XEROFORM 1X8 LF (GAUZE/BANDAGES/DRESSINGS) ×1 IMPLANT
GLENOSPHERE 39+4 LAT/24 UNI RV (Joint) IMPLANT
GLOVE BIO SURGEON STRL SZ7.5 (GLOVE) ×4 IMPLANT
GLOVE BIO SURGEON STRL SZ8 (GLOVE) ×4 IMPLANT
GLOVE BIOGEL PI IND STRL 8 (GLOVE) ×2 IMPLANT
GLOVE SURG UNDER LTX SZ8 (GLOVE) ×1 IMPLANT
GOWN STRL REUS W/ TWL LRG LVL3 (GOWN DISPOSABLE) ×1 IMPLANT
GOWN STRL REUS W/ TWL XL LVL3 (GOWN DISPOSABLE) ×1 IMPLANT
GOWN STRL REUS W/TWL LRG LVL3 (GOWN DISPOSABLE) ×1
GOWN STRL REUS W/TWL XL LVL3 (GOWN DISPOSABLE) ×1
HOOD PEEL AWAY T7 (MISCELLANEOUS) ×3 IMPLANT
INSERT HUMERAL MED 39/ +3 (Shoulder) IMPLANT
INSERT MEDIUM HUMERAL 39/ +3 (Shoulder) ×1 IMPLANT
IV NS IRRIG 3000ML ARTHROMATIC (IV SOLUTION) ×1 IMPLANT
KIT STABILIZATION SHOULDER (MISCELLANEOUS) ×1 IMPLANT
KIT TURNOVER KIT A (KITS) ×1 IMPLANT
MANIFOLD NEPTUNE II (INSTRUMENTS) ×1 IMPLANT
MASK FACE SPIDER DISP (MASK) ×1 IMPLANT
MAT ABSORB  FLUID 56X50 GRAY (MISCELLANEOUS) ×1
MAT ABSORB FLUID 56X50 GRAY (MISCELLANEOUS) ×1 IMPLANT
NDL MAYO CATGUT SZ1 (NEEDLE) IMPLANT
NDL SAFETY ECLIP 18X1.5 (MISCELLANEOUS) ×1 IMPLANT
NDL SPNL 20GX3.5 QUINCKE YW (NEEDLE) ×1 IMPLANT
NEEDLE MAYO CATGUT SZ1 (NEEDLE) IMPLANT
NEEDLE SPNL 20GX3.5 QUINCKE YW (NEEDLE) ×1 IMPLANT
NS IRRIG 500ML POUR BTL (IV SOLUTION) ×1 IMPLANT
PACK ARTHROSCOPY SHOULDER (MISCELLANEOUS) ×1 IMPLANT
PAD ARMBOARD 7.5X6 YLW CONV (MISCELLANEOUS) ×1 IMPLANT
PAD WRAPON POLAR SHDR UNIV (MISCELLANEOUS) ×1 IMPLANT
PIN NITINOL TARGETER 2.8 (PIN) IMPLANT
PIN SET MODULAR GLENOID SYSTEM (PIN) IMPLANT
PULSAVAC PLUS IRRIG FAN TIP (DISPOSABLE) ×1
SCREW CENTRAL MOD 30MM (Screw) IMPLANT
SCREW PERI LOCK 5.5X16 (Screw) IMPLANT
SCREW PERI LOCK 5.5X24 (Screw) IMPLANT
SCREW PERI LOCK 5.5X36 (Screw) IMPLANT
SLING ULTRA II M (MISCELLANEOUS) IMPLANT
SPONGE T-LAP 18X18 ~~LOC~~+RFID (SPONGE) ×2 IMPLANT
STAPLER SKIN PROX 35W (STAPLE) ×1 IMPLANT
STEM HUMERAL UNI REVERSE SZ10 (Stem) IMPLANT
SUT ETHIBOND 0 MO6 C/R (SUTURE) ×1 IMPLANT
SUT FIBERWIRE #2 38 BLUE 1/2 (SUTURE) ×4
SUT VIC AB 0 CT1 36 (SUTURE) ×1 IMPLANT
SUT VIC AB 2-0 CT1 27 (SUTURE) ×2
SUT VIC AB 2-0 CT1 TAPERPNT 27 (SUTURE) ×2 IMPLANT
SUTURE FIBERWR #2 38 BLUE 1/2 (SUTURE) ×4 IMPLANT
SYR 10ML LL (SYRINGE) ×1 IMPLANT
SYR 30ML LL (SYRINGE) ×1 IMPLANT
SYR TOOMEY 50ML (SYRINGE) ×1 IMPLANT
TIP FAN IRRIG PULSAVAC PLUS (DISPOSABLE) ×1 IMPLANT
TRAP FLUID SMOKE EVACUATOR (MISCELLANEOUS) ×1 IMPLANT
WATER STERILE IRR 500ML POUR (IV SOLUTION) ×1 IMPLANT
WRAPON POLAR PAD SHDR UNIV (MISCELLANEOUS) ×1

## 2022-02-09 NOTE — H&P (Signed)
History of Present Illness: Marcus Coleman is a 81 y.o. male who presents today for his surgical history and physical for upcoming left reverse total shoulder arthroplasty. Surgery is scheduled to undergo this procedure with Dr. Roland Rack on 02/09/22. The patient denies any changes in his medical history since he was last evaluated. He denies any personal history of heart attack or stroke. The patient does have a history of chronic A-fib, he does take Cardizem but is not on any blood thinning medication. The patient does not wear any history of a DVT in the past. He was was initially placed on Eliquis but suffered a acute GI bleed when on this blood thinning medication. The patient denies any trauma or injury since his last evaluation affecting left shoulder. He reports a 5 out of 10 pain score at today's visit. He does have a history of anemia. He denies any personal history of asthma or COPD. He is not a diabetic. The patient has undergone multiple left shoulder steroid injections in addition to physical therapy in the past for his left shoulder.  Past Medical History: Allergic rhinitis  Allergic state nsaids  Anemia  Borderline hypertension  Cancer (CMS-HCC) on hand  Chronic diarrhea  DDD (degenerative disc disease)  Hiatal hernia  Hx of colonic polyps (HYPERPLASTIC)  Hyperlipidemia  IDA (iron deficiency anemia) 04/07/2016  Low back pain  Low testosterone  Persistent atrial fibrillation (CMS-HCC) 06/01/2021  S/P ERCP 01/23/2013  Vasovagal syncope  Venous disease   Past Surgical History: [place] left eyelid  EXTRACTION TEETH 1966 (Lower two wisdom teeth, [facility])  [place] (Upper two wisdom teeth, [facility] in [place])  Overly Removal 1968 (Multiple boils right forearm)  [place] 09/1986  Nose surgery 02/2000  COLONOSCOPY 01/17/2001 (Hyperplastic Polyps)  EGD  05/08/2006 (No repeat per RTE)  CAPSULE ENDOSCOPY 06/20/2006  Foot surgery Right 10/2007  EYELID SURGERY 11/2007 (Upper and lower eyelids)  Squamous Cell Carcinoma Removal 03/12/2012 (Right hand)  ERCP 01/02/2013 Verdie Shire, MD)  ENDOSCOPIC RETROGRADE CHOLANGIO-PANCREATOGRAPHY W/SPHINCTEROTOMY N/A 01/23/2013  Procedure: ENDOSCOPIC RETROGRADE CHOLANGIO-PANCREATOGRAPHY W/EXTRACTION STONE; Surgeon: Sherri Rad, MD; Location: [facility]; Service: Gastroenterology; (MRERCP 01/23/13 [facility])  COLONOSCOPY 06/26/2016 (Adenomatous Polyps: CBF 06/2019)  upper endoscopy 04/19/2019  CATARACT EXTRACTION Bilateral Left 10/2006, Right 04/11/2011  COLONOSCOPY [id number] (Int Hemorrhoids: CBF 04/2016; Recall Ltr mailed 03/09/2016)  HERNIA REPAIR (ventral hernia with mesh)  Levulan Kerastick (Blue light) 09/29/13, 10/28/13   Past Family History: Diabetes Mother  Cancer Mother (lung)  COPD Mother  Lung cancer Mother  Heart failure Mother  Diabetes type II Mother  Thyroid disease Mother  Colon cancer Maternal Grandmother 49  Anesthesia problems Neg Hx  Malignant hypertension Neg Hx   Medications: acetaminophen (TYLENOL) 325 MG tablet Take 650 mg by mouth every 4 (four) hours as needed for Pain  cetirizine (ZYRTEC) 10 mg capsule Take 10 mg by mouth every morning.  clindamycin (CLEOCIN T) 1 % topical solution Apply 1 % topically Use as directed as needed.  codeine-guaiFENesin 10-100 mg/5 mL oral liquid Take 5 mLs by mouth every 6 (six) hours as needed 70 mL 0  dilTIAZem (CARDIZEM LA) 300 mg 24 hr tablet Take 1 tablet by mouth every morning  FUROsemide (LASIX) 20 MG tablet Take 1-2 pills daily for swelling 60 tablet 4  metoprolol succinate (TOPROL-XL) 50 MG XL tablet Take 1.5 tablets (75 mg total) by mouth once daily 45  tablet 11  timoloL maleate (TIMOPTIC) 0.5 % ophthalmic solution Apply to eye   Allergies: NSAIDs - Other (Stomach bleed if taking for extended time)   Review of  Systems:  A comprehensive 14 point ROS was performed, reviewed by me today, and the pertinent orthopaedic findings are documented in the HPI.  Physical Exam: BP (!) 184/84  Ht 167.6 cm ('5\' 6"'$ )  Wt 95.1 kg (209 lb 9.6 oz)  BMI 33.83 kg/m  General/Constitutional: The patient appears to be well-nourished, well-developed, and in no acute distress. Neuro/Psych: Normal mood and affect, oriented to person, place and time. Eyes: Non-icteric. Pupils are equal, round, and reactive to light, and exhibit synchronous movement. ENT: Unremarkable. Lymphatic: No palpable adenopathy. Respiratory: Lungs clear to auscultation, Normal chest excursion, No wheezes, and Non-labored breathing Cardiovascular: Regular rate and rhythm. No murmurs. and No edema, swelling or tenderness, except as noted in detailed exam. Integumentary: No impressive skin lesions present, except as noted in detailed exam. Musculoskeletal: Unremarkable, except as noted in detailed exam.  Left shoulder exam: SKIN: normal SWELLING: none WARMTH: none LYMPH NODES: no adenopathy palpable CREPITUS: Mild glenohumeral crepitance TENDERNESS: Non-tender ROM (active):  Forward flexion: 60 degrees Abduction: 40 degrees Internal rotation: L1 ROM (passive):  Forward flexion: 145 degrees Abduction: 135 degrees  ER/IR at 90 abd: 90 degrees / 60 degrees  He notes moderate pain with attempted forward flexion and abduction, and mild pain with active internal rotation.  STRENGTH: Forward flexion: 2/5 Abduction: 2/5 External rotation: 3/5 Internal rotation: 4/5 Pain with RC testing: Moderate pain with attempted forward flexion or abduction, and mild pain with resisted external rotation.  STABILITY: Normal  SPECIAL TESTS: Luan Pulling' test: positive, mild Speed's test: Not evaluated Capsulitis - pain w/ passive ER: no Crossed arm test: Mildly positive Crank: Negative Anterior apprehension: Not evaluated Posterior apprehension:  Negative  He is neurovascularly intact to the left upper extremity.  Imaging:  Shoulder X-Ray Imaging: Recent true AP, Y-scapular, and axillary views of the left shoulder are available for review and have been reviewed by myself. These films demonstrate moderate degenerative changes. The subacromial space is markedly decreased. There is no subacromial or infra-clavicular spurring. He demonstrates a Type II acromion.  CT OF THE UPPER LEFT EXTREMITY WITHOUT CONTRAST:  1. Mild osteoarthritis of the glenohumeral joint.  2. Loss of the normal acromiohumeral distance with a bone-on-bone  appearance as can be seen with a chronic rotator cuff tear. Mild  atrophy of the rotator cuff muscles. Chronic supraspinatus and  infraspinatus tendon tears.  3. Aortic Atherosclerosis (ICD10-I70.0).  4. Partially visualized cervicothoracic spondylosis.   Impression: 1. Rotator cuff arthropathy, left. 2. Nontraumatic complete tear of left rotator cuff  Plan:  1. Treatment options were discussed today with the patient. 2. The patient is scheduled for a left reverse total shoulder arthroplasty with Dr. Roland Rack on 02/09/2022. 3. The patient was instructed on the risk and benefits of surgery and wishes to proceed at this time. 4. This document will serve as a surgical history and physical for the patient. 5. The patient will follow-up per standard postop protocol. They can call the clinic they have any questions, new symptoms develop or symptoms worsen.  The procedure was discussed with the patient, as were the potential risks (including bleeding, infection, nerve and/or blood vessel injury, persistent or recurrent pain, failure of the hardware, dislocation, subluxation, stress fracture, axial nerve injury, need for further surgery, blood clots, strokes, heart attacks and/or arhythmias, pneumonia, etc.) and benefits. The patient states his  understanding and wishes to proceed.    H&P reviewed and patient  re-examined. No changes.

## 2022-02-09 NOTE — Op Note (Signed)
02/09/2022  1:30 PM  Patient:   Marcus Coleman  Pre-Op Diagnosis:   Massive chronic irreparable rotator cuff tear with rotator cuff arthropathy, left shoulder.  Post-Op Diagnosis:   Same  Procedure:   Reverse left total shoulder arthroplasty.  Surgeon:   Pascal Lux, MD  Assistant:   Cameron Proud, PA-C  Anesthesia:   General endotracheal with an interscalene block using Exparel placed preoperatively by the anesthesiologist.  Findings:   As above.  Complications:   None  EBL:   100 cc  Fluids:   800 cc crystalloid  UOP:   None  TT:   None  Drains:   None  Closure:   Staples  Implants:   All press-fit Arthrex system with a #10 Univers Revers humeral stem, a 39 mm SutureCup with a +3 insert, a 24 mm base plate with a 30 mm central screw, and a 39 mm +4 lateralized glenosphere.  Brief Clinical Note:   The patient is a 81 year old male with a long history of gradually worsening left shoulder pain and weakness. His symptoms have progressed despite medications, activity modification, etc. His history and examination consistent with a chronic massive rotator cuff tear with cuff arthropathy, all of which were confirmed by plain radiographs and CT scanning. The patient presents at this time for a reverse left total shoulder arthroplasty.  Procedure:   The patient underwent placement of an interscalene block using Exparel by the anesthesiologist in the preoperative holding area before being brought into the operating room and lain in the supine position. The patient then underwent general endotracheal intubation and anesthesia before the patient was repositioned in the beach chair position using the beach chair positioner. The left shoulder and upper extremity were prepped with ChloraPrep solution before being draped sterilely. Preoperative antibiotics were administered. A timeout was performed to verify the appropriate surgical site.    A standard anterior approach to the shoulder  was made through an approximately 4-5 inch incision. The incision was carried down through the subcutaneous tissues to expose the deltopectoral fascia. The interval between the deltoid and pectoralis muscles was identified and this plane developed, retracting the cephalic vein laterally with the deltoid muscle. The conjoined tendon was identified. Its lateral margin was dissected and the Kolbel self-retraining retractor inserted. The "three sisters" were identified and cauterized. Bursal tissues were removed to improve visualization.   The subscapularis tendon remnant was released from its attachment to the lesser tuberosity 1 cm proximal to its insertion and several tagging sutures placed. The inferior capsule was released with care after identifying and protecting the axillary nerve. The proximal humeral cut was made at approximately 30 of retroversion using the extra-medullary guide.   Attention was redirected to the glenoid. The labrum was debrided circumferentially before the center of the glenoid was marked with electrocautery. The guidewire was drilled into the glenoid neck using the appropriate guide. After verifying its position, it was overreamed with the baseplate reamer to create a flat surface before the peripheral reamer was introduced to clean off any peripheral soft tissues. The central 10 mm coring reamer was then inserted before the hole was tapped to complete the glenoid preparation. The permanent mini-baseplate construct with a 30 mm central screw attachment was screwed into place and tightened securely. The baseplate was then further secured using four peripheral locking screws. The permanent 39 mm +4 mm lateralized glenosphere was then impacted into place and its Morse taper locking mechanism verified using manual distraction. Finally, the glenosphere locking  screw was inserted and tightened securely.  Attention was directed to the humeral side. The humeral canal was reamed with first  the 5 mm and then the 6 mm reamer. The canal was broached beginning with a #5 broach and progressing to a #10 broach. This was left in place and the metaphyseal inset reamer was used to create the metaphyseal socket.  A trial reduction performed using the 39 mm trial humeral platform with the +3 mm insert. The arm demonstrated excellent range of motion as the hand could be brought across the chest to the opposite shoulder and brought to the top of the patient's head and to the patient's ear. The shoulder appeared stable throughout this range of motion. The joint was dislocated and the trial components removed. The permanent #10 Arthrex Univers Revers stem was impacted into place with care taken to maintain the appropriate version. The permanent 39 mm SutureCup humeral platform was then impacted into place before the +3 mm insert was snapped into place. The shoulder was relocated using two finger pressure and again placed through a range of motion with the findings as described above.  The wound was copiously irrigated with sterile saline solution using the jet lavage system before a total of 10 cc of Exparel mixed with 30 cc of 0.5% Sensorcaine with epinephrine was injected into the pericapsular and peri-incisional tissues to help with postoperative analgesia. The subscapularis tendon was reapproximated using #2 FiberWire interrupted sutures. The deltopectoral interval was closed using #0 Vicryl interrupted sutures before the subcutaneous tissues were closed using 2-0 Vicryl interrupted sutures. The skin was closed using staples. Prior to closing the skin, 1 g of transexemic acid in 10 cc of normal saline was injected intra-articularly to help with postoperative bleeding. A sterile occlusive dressing was applied to the wound before the arm was placed into a shoulder immobilizer with an abduction pillow. A Polar Care system also was applied to the shoulder. The patient was then transferred back to a hospital bed  before being awakened, extubated, and returned to the recovery room in satisfactory condition after tolerating the procedure well.

## 2022-02-09 NOTE — Transfer of Care (Signed)
Immediate Anesthesia Transfer of Care Note  Patient: Marcus Coleman  Procedure(s) Performed: REVERSE SHOULDER ARTHROPLASTY AND BICEPS TENODESIS. (Left: Shoulder)  Patient Location: PACU  Anesthesia Type:General and Regional  Level of Consciousness: drowsy  Airway & Oxygen Therapy: Patient Spontanous Breathing and Patient connected to face mask oxygen  Post-op Assessment: Report given to RN, Post -op Vital signs reviewed and stable, and Patient moving all extremities  Post vital signs: Reviewed and stable  Last Vitals:  Vitals Value Taken Time  BP 111/80 02/09/22 1330  Temp    Pulse 109 02/09/22 1335  Resp 13 02/09/22 1335  SpO2 90 % 02/09/22 1335  Vitals shown include unvalidated device data.  Last Pain:  Vitals:   02/09/22 0931  TempSrc: Temporal  PainSc: 0-No pain         Complications: No notable events documented.

## 2022-02-09 NOTE — Discharge Instructions (Addendum)
Orthopedic discharge instructions: May shower once nerve block has worn off on post-op day #4 (Monday).  Apply ice frequently to shoulder or use Polar Care device. Take hydrocodone as prescribed when needed.  May supplement with ES Tylenol if necessary. Take 2 enteric-coated baby aspirins (81 mg) BID for 4 weeks to reduce likelihood of blood clot. Keep shoulder immobilizer on at all times except may remove for bathing purposes. Follow-up in 10-14 days or as scheduled.   AMBULATORY SURGERY  DISCHARGE INSTRUCTIONS   The drugs that you were given will stay in your system until tomorrow so for the next 24 hours you should not:  Drive an automobile Make any legal decisions Drink any alcoholic beverage   You may resume regular meals tomorrow.  Today it is better to start with liquids and gradually work up to solid foods.  You may eat anything you prefer, but it is better to start with liquids, then soup and crackers, and gradually work up to solid foods.   Please notify your doctor immediately if you have any unusual bleeding, trouble breathing, redness and pain at the surgery site, drainage, fever, or pain not relieved by medication.    Additional Instructions:        Please contact your physician with any problems or Same Day Surgery at [phone], Monday through Friday 6 am to 4 pm, or [place] at [facility] number at [phone].   POLAR CARE INFORMATION  [email or web address]  How to use [place] Cold Therapy System?  YouTube   [email or web address]  OPERATING INSTRUCTIONS  Start the product With dry hands, connect the transformer to the electrical connection located on the top of the cooler. Next, plug the transformer into an appropriate electrical outlet. The unit will automatically start running at this point.  To stop the pump, disconnect electrical power.  Unplug to stop the product when not in use. Unplugging the Polar  Care unit turns it off. Always unplug immediately after use. Never leave it plugged in while unattended. Remove pad.    FIRST ADD WATER TO FILL LINE, THEN ICE---Replace ice when existing ice is almost melted  1 Discuss Treatment with your [place] Practitioner and Use Only as Prescribed 2 Apply Insulation Barrier & Cold Therapy Pad 3 Check for Moisture 4 Inspect Skin Regularly  Tips and Trouble Shooting Usage Tips 1. Use cubed or chunked ice for optimal performance. 2. It is recommended to drain the Pad between uses. To drain the pad, hold the Pad upright with the hose pointed toward the ground. Depress the black plunger and allow water to drain out. 3. You may disconnect the Pad from the unit without removing the pad from the affected area by depressing the silver tabs on the hose coupling and gently pulling the hoses apart. The Pad and unit will seal itself and will not leak. Note: Some dripping during release is normal. 4. DO NOT RUN PUMP WITHOUT WATER! The pump in this unit is designed to run with water. Running the unit without water will cause permanent damage to the pump. 5. Unplug unit before removing lid.  TROUBLESHOOTING GUIDE Pump not running, Water not flowing to the pad, Pad is not getting cold 1. Make sure the transformer is plugged into the wall outlet. 2. Confirm that the ice and water are filled to the indicated levels. 3. Make sure there are no kinks in the pad. 4. Gently pull on the blue tube to make sure the tube/pad junction is  straight. 5. Remove the pad from the treatment site and ll it while the pad is lying at; then reapply. 6. Confirm that the pad couplings are securely attached to the unit. Listen for the double clicks (Figure 1) to confirm the pad couplings are securely attached.  Leaks    Note: Some condensation on the lines, controller, and pads is unavoidable, especially in warmer climates. 1. If using a [place] Polar Care Cold Therapy unit with a  detachable Cold Therapy Pad, and a leak exists (other than condensation on the lines) disconnect the pad couplings. Make sure the silver tabs on the couplings are depressed before reconnecting the pad to the pump hose; then confirm both sides of the coupling are properly clicked in. 2. If the coupling continues to leak or a leak is detected in the pad itself, stop using it and call [facility] at [phone].  Cleaning After use, empty and dry the unit with a soft cloth. Warm water and mild detergent may be used occasionally to clean the pump and tubes.  WARNING: The [place] can be cold enough to cause serious injury, including full skin necrosis. Follow these Operating Instructions, and carefully read the Product Insert (see pouch on side of unit) and the Cold Therapy Pad Fitting Instructions (provided with each Cold Therapy Pad) prior to use.      Interscalene Nerve Block with Exparel   For your surgery you have received an Interscalene Nerve Block with Exparel. Nerve Blocks affect many types of nerves, including nerves that control movement, pain and normal sensation.  You may experience feelings such as numbness, tingling, heaviness, weakness or the inability to move your arm or the feeling or sensation that your arm has "fallen asleep". A nerve block with Exparel can last up to 5 days.  Usually the weakness wears off first.  The tingling and heaviness usually wear off next.  Finally you may start to notice pain.  Keep in mind that this may occur in any order.  Once a nerve block starts to wear off it is usually completely gone within 60 minutes. ISNB may cause mild shortness of breath, a hoarse voice, blurry vision, unequal pupils, or drooping of the face on the same side as the nerve block.  These symptoms will usually resolve with the numbness.  Very rarely the procedure itself can cause mild seizures. If needed, your surgeon will give you a prescription for pain medication.   It will take about 60 minutes for the oral pain medication to become fully effective.  So, it is recommended that you start taking this medication before the nerve block first begins to wear off, or when you first begin to feel discomfort. Take your pain medication only as prescribed.  Pain medication can cause sedation and decrease your breathing if you take more than you need for the level of pain that you have. Nausea is a common side effect of many pain medications.  You may want to eat something before taking your pain medicine to prevent nausea. After an Interscalene nerve block, you cannot feel pain, pressure or extremes in temperature in the effected arm.  Because your arm is numb it is at an increased risk for injury.  To decrease the possibility of injury, please practice the following:  While you are awake change the position of your arm frequently to prevent too much pressure on any one area for prolonged periods of time.  If you have a cast or  tight dressing, check the color or your fingers every couple of hours.  Call your surgeon with the appearance of any discoloration (white or blue). If you are given a sling to wear before you go home, please wear it  at all times until the block has completely worn off.  Do not get up at night without your sling. Please contact [facility] or your surgeon if you do not begin to regain sensation after 7 days from the surgery.  Anesthesia may be contacted by calling the Same Day Surgery Department, Mon. through Fri., 6 am to 4 pm at [phone].   If you experience any other problems or concerns, please contact your surgeon's office. If you experience severe or prolonged shortness of breath go to the nearest emergency department. SHOULDER SLING IMMOBILIZER   VIDEO Slingshot 2 Shoulder Brace Application - YouTube ---[email or web address]  INSTRUCTIONS While supporting the injured arm, slide the forearm into the sling. Wrap  the adjustable shoulder strap around the neck and shoulders and attach the strap end to the sling using  the "alligator strap tab."  Adjust the shoulder strap to the required length. Position the shoulder pad behind the neck. To secure the shoulder pad location (optional), pull the shoulder strap away from the shoulder pad, unfold the hook material on the top of the pad, then press the shoulder strap back onto the hook material to secure the pad in place. Attach the closure strap across the open top of the sling. Position the strap so that it holds the arm securely in the sling. Next, attach the thumb strap to the open end of the sling between the thumb and fingers. After sling has been fit, it may be easily removed and reapplied using the quick release buckle on shoulder strap. If a neutral pillow or 15 abduction pillow is included, place the pillow at the waistline. Attach the sling to the pillow, lining up hook material on the pillow with the loop on sling. Adjust the waist strap to fit.  If waist strap is too long, cut it to fit. Use the small piece of double sided hook material (located on top of the pillow) to secure the strap end. Place the double sided hook material on the inside of the cut strap end and secure it to the waist strap.     If no pillow is included, attach the waist strap to the sling and adjust to fit.    Washing Instructions: Straps and sling must be removed and cleaned regularly depending on your activity level and perspiration. Hand wash straps and sling in cold water with mild detergent, rinse, air dry

## 2022-02-09 NOTE — Anesthesia Preprocedure Evaluation (Signed)
Anesthesia Evaluation  Patient identified by MRN, date of birth, ID band Patient awake    Reviewed: Allergy & Precautions, H&P , NPO status , Patient's Chart, lab work & pertinent test results, reviewed documented beta blocker date and time   Airway Mallampati: II  TM Distance: >3 FB Neck ROM: full    Dental  (+) Teeth Intact   Pulmonary pneumonia, resolved, former smoker   Pulmonary exam normal        Cardiovascular Exercise Tolerance: Poor hypertension, On Medications + CAD  Normal cardiovascular exam+ Valvular Problems/Murmurs  Rhythm:regular Rate:Normal     Neuro/Psych  Neuromuscular disease  negative psych ROS   GI/Hepatic Neg liver ROS, hiatal hernia,,,  Endo/Other  negative endocrine ROS    Renal/GU Renal disease  negative genitourinary   Musculoskeletal   Abdominal   Peds  Hematology  (+) Blood dyscrasia, anemia   Anesthesia Other Findings Past Medical History: No date: Aortic atherosclerosis (HCC) No date: Atrial fibrillation ([place])     Comment:  a.) CHA2DS2VASc = 6 (age x2, HTN, DVT x2, vascular               disease history);  b.) rate/rhythm maintained on oral               diltiazem + metoprolol succinate; chronic anticoagulation              (apixaban) discontinued due to GI bleeding No date: BPH with obstruction/lower urinary tract symptoms 01/27/2022: Bronchitis No date: CAD (coronary artery disease) No date: Cardiomegaly No date: Chronic diarrhea No date: Compression fracture of thoracic vertebra (HCC)     Comment:  a.) CT renal 12/30/2021: chronic appearing T11-T12 (70%               height loss) No date: DDD (degenerative disc disease), cervical No date: Diverticulosis 03/31/2019: DVT (deep venous thrombosis) (HCC)     Comment:  a.) anticoagulation intiated (apixaban), however               subsequently discontinued due to GI bleeding No date: ETOH abuse No date: GI bleed No date: Heart  murmur No date: Hiatal hernia No date: Hyperlipidemia No date: Hypertension No date: IDA (iron deficiency anemia) No date: Low testosterone No date: Nephrolithiasis No date: Pneumonia No date: Rotator cuff arthropathy, left No date: 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 No date: Vasovagal syncope Past Surgical History: 1968: boils removed from right forearm No date: CARDIAC CATHETERIZATION 2008: CATARACT EXTRACTION; Left No date: catracts; Bilateral     Comment:  left 2008, right 2013 No date: CHOLECYSTECTOMY 06/26/2016: COLONOSCOPY WITH PROPOFOL; N/A     Comment:  Procedure: COLONOSCOPY WITH PROPOFOL;  Surgeon: Manya Silvas, MD;  Location: [facility] ENDOSCOPY;  Service:               Endoscopy;  Laterality: N/A; 07/03/2019: COLONOSCOPY WITH PROPOFOL; N/A     Comment:  Procedure: COLONOSCOPY WITH PROPOFOL;  Surgeon: [name], MD;  Location: [facility] ENDOSCOPY;  Service:               Gastroenterology;  Laterality: N/A; No date: ENDOSCOPIC RETROGRADE CHOLANGIOPANCREATOGRAPHY (ERCP) WITH  PROPOFOL No date: ENDOSCOPIC VEIN LASER TREATMENT     Comment:  right leg 05/2015, left leg 09/2015 04/19/2019: ESOPHAGOGASTRODUODENOSCOPY; N/A     Comment:  Procedure: ESOPHAGOGASTRODUODENOSCOPY (EGD);  Surgeon:               [name], MD;  Location: [facility] ENDOSCOPY;  Service:               Gastroenterology;  Laterality: N/A; 07/03/2019: ESOPHAGOGASTRODUODENOSCOPY (EGD) WITH PROPOFOL; N/A     Comment:  Procedure: ESOPHAGOGASTRODUODENOSCOPY (EGD) WITH               PROPOFOL;  Surgeon: [name], MD;  Location: [facility]               ENDOSCOPY;  Service: Gastroenterology;  Laterality: N/A; 08/07/2019: ESOPHAGOGASTRODUODENOSCOPY (EGD) WITH PROPOFOL; N/A     Comment:  Procedure: ESOPHAGOGASTRODUODENOSCOPY (EGD) WITH               PROPOFOL;  Surgeon: Milus Banister, MD;  Location: [facility]               ENDOSCOPY;  Service: Endoscopy;  Laterality: N/A; 08/07/2019: EUS; N/A      Comment:  Procedure: UPPER ENDOSCOPIC ULTRASOUND (EUS) RADIAL;                Surgeon: Milus Banister, MD;  Location: [facility] ENDOSCOPY;                Service: Endoscopy;  Laterality: N/A; No date: eyelid surgery 2009: FOOT SURGERY; Right No date: HEMORROIDECTOMY No date: HERNIA REPAIR 2014: mrercp 2011: NOSE SURGERY No date: RHINOPLASTY 2013: squamous cell carcinoma removed from right hand No date: TONSILLECTOMY No date: WISDOM TOOTH EXTRACTION   Reproductive/Obstetrics negative OB ROS                             Anesthesia Physical Anesthesia Plan  ASA: 3  Anesthesia Plan: General ETT   Post-op Pain Management: Regional block*   Induction:   PONV Risk Score and Plan: 4 or greater  Airway Management Planned:   Additional Equipment:   Intra-op Plan:   Post-operative Plan:   Informed Consent: I have reviewed the patients History and Physical, chart, labs and discussed the procedure including the risks, benefits and alternatives for the proposed anesthesia with the patient or authorized representative who has indicated his/her understanding and acceptance.     Dental Advisory Given  Plan Discussed with: CRNA  Anesthesia Plan Comments:        Anesthesia Quick Evaluation

## 2022-02-09 NOTE — Anesthesia Procedure Notes (Signed)
Procedure Name: Intubation Date/Time: 02/09/2022 11:00 AM  Performed by: Esaw Grandchild, CRNAPre-anesthesia Checklist: Patient identified, Emergency Drugs available, Suction available and Patient being monitored Patient Re-evaluated:Patient Re-evaluated prior to induction Oxygen Delivery Method: Circle system utilized Preoxygenation: Pre-oxygenation with 100% oxygen Induction Type: IV induction Ventilation: Mask ventilation without difficulty and Oral airway inserted - appropriate to patient size Laryngoscope Size: Sabra Heck and 2 Grade View: Grade I Tube type: Oral Tube size: 7.5 mm Number of attempts: 1 Airway Equipment and Method: Stylet, Oral airway, Bite block and LTA kit utilized Placement Confirmation: ETT inserted through vocal cords under direct vision, positive ETCO2 and breath sounds checked- equal and bilateral Secured at: 22 cm Tube secured with: Tape Dental Injury: Teeth and Oropharynx as per pre-operative assessment

## 2022-02-09 NOTE — Anesthesia Procedure Notes (Signed)
Anesthesia Regional Block: Interscalene brachial plexus block   Pre-Anesthetic Checklist: , timeout performed,  Correct Patient, Correct Site, Correct Laterality,  Correct Procedure, Correct Position, site marked,  Risks and benefits discussed,  Surgical consent,  Pre-op evaluation,  At surgeon's request and post-op pain management  Laterality: Upper and Left  Prep: Dura Prep       Needles:  Injection technique: Single-shot  Needle Type: Echogenic Stimulator Needle     Needle Length: 10cm  Needle Gauge: 20     Additional Needles:   Procedures:,,,, ultrasound used (permanent image in chart),,    Narrative:  Start time: 02/09/2022 10:00 AM End time: 02/09/2022 10:10 AM  Performed by: Personally  Anesthesiologist: Molli Barrows, MD  Additional Notes: Pt. Identified and accepting of procedure after risks and benefits fully reviewed and questions answered. Time out performed and laterality confirmed prior to procedure.  ISNB  performed without difficulty and well tolerated.  Neg IV and SATD.  No pain on injection of Local anesthetic and VSST.

## 2022-02-09 NOTE — Evaluation (Signed)
Occupational Therapy Evaluation Patient Details Name: Marcus Coleman MRN: [id number] DOB: [birth year] Today's Date: 02/09/2022   History of Present Illness Marcus Coleman is a 81 y.o. male s/p left reverse total shoulder arthroplasty on 02/09/22.   Clinical Impression   Mr Malkiewicz was seen for OT evaluation this date. Prior to hospital admission, pt was MOD I for mobility using SPC. Pt lives with spouse. Pt presents to acute OT demonstrating impaired ADL performance and functional mobility 2/2 decreased activity tolerance and functional balance deficits. Pt currently requires MAX A don/doff sling, polar care, and long sleeve shirt in sitting. MOD A don pants. Pt and spouse instructed in polar care mgt, compression stockings mgt, sling/immobilizer mgt, ROM exercises for LUE (with instructions for no shoulder exercises until full sensation has returned), LUE precautions, adaptive strategies for bathing/dressing, and considerations for sleep. All education complete, will sign off. Upon hospital discharge, recommend following physicians recommendation.   Recommendations for follow up therapy are one component of a multi-disciplinary discharge planning process, led by the attending physician.  Recommendations may be updated based on patient status, additional functional criteria and insurance authorization.   Follow Up Recommendations  Follow physician's recommendations for discharge plan and follow up therapies     Assistance Recommended at Discharge Frequent or constant Supervision/Assistance  Patient can return home with the following A little help with walking and/or transfers;A lot of help with bathing/dressing/bathroom    Functional Status Assessment  Patient has had a recent decline in their functional status and demonstrates the ability to make significant improvements in function in a reasonable and predictable amount of time.  Equipment Recommendations  None recommended by OT     Recommendations for Other Services       Precautions / Restrictions Precautions Precautions: Fall Restrictions Weight Bearing Restrictions: Yes LUE Weight Bearing: Non weight bearing      Mobility Bed Mobility               General bed mobility comments: NT    Transfers Overall transfer level: Needs assistance Equipment used: Straight cane Transfers: Sit to/from Stand Sit to Stand: Min guard                  Balance Overall balance assessment: Needs assistance Sitting-balance support: No upper extremity supported, Feet supported Sitting balance-Leahy Scale: Good     Standing balance support: No upper extremity supported, During functional activity Standing balance-Leahy Scale: Fair                             ADL either performed or assessed with clinical judgement   ADL Overall ADL's : Needs assistance/impaired                                       General ADL Comments: MAX A don/doff sling, polar care, and long sleeve shirt in sitting. MOD A don pants.      Pertinent Vitals/Pain Pain Assessment Pain Assessment: No/denies pain     Hand Dominance Right   Extremity/Trunk Assessment Upper Extremity Assessment Upper Extremity Assessment: Difficult to assess due to impaired cognition   Lower Extremity Assessment Lower Extremity Assessment: Generalized weakness       Communication Communication Communication: No difficulties   Cognition Arousal/Alertness: Awake/alert Behavior During Therapy: WFL for tasks assessed/performed Overall Cognitive Status: Within Functional Limits for tasks assessed  Home Living Family/patient expects to be discharged to:: Private residence Living Arrangements: Spouse/significant other Available Help at Discharge: Family;Available 24 hours/day Type of Home: House Home Access: Stairs to enter [employer] of Steps:  3 Entrance Stairs-Rails: Can reach both Home Layout: One level               Home Equipment: Cane - single point          Prior Functioning/Environment Prior Level of Function : Independent/Modified Independent             Mobility Comments: SPC          OT Problem List: Decreased strength;Decreased range of motion;Decreased activity tolerance;Impaired balance (sitting and/or standing);Decreased safety awareness         OT Goals(Current goals can be found in the care plan section) Acute Rehab OT Goals Patient Stated Goal: to go home OT Goal Formulation: With patient/family Time For Goal Achievement: 02/23/22 Potential to Achieve Goals: Good   AM-PAC OT "6 Clicks" Daily Activity     Outcome Measure Help from another person eating meals?: None Help from another person taking care of personal grooming?: A Little Help from another person toileting, which includes using toliet, bedpan, or urinal?: A Lot Help from another person bathing (including washing, rinsing, drying)?: A Lot Help from another person to put on and taking off regular upper body clothing?: A Lot Help from another person to put on and taking off regular lower body clothing?: A Little 6 Click Score: 16   End of Session Nurse Communication: Mobility status  Activity Tolerance: Patient tolerated treatment well Patient left: in chair;with call bell/phone within reach  OT Visit Diagnosis: Unsteadiness on feet (R26.81);Muscle weakness (generalized) (M62.81)                Time: 1450-1536 OT Time Calculation (min): 46 min Charges:  OT General Charges $OT Visit: 1 Visit OT Evaluation $OT Eval Low Complexity: 1 Low OT Treatments $Self Care/Home Management : 38-52 mins  Dessie Coma, M.S. OTR/L  02/09/22, 4:18 PM  ascom [phone]

## 2022-02-10 ENCOUNTER — Encounter (INDEPENDENT_AMBULATORY_CARE_PROVIDER_SITE_OTHER): Payer: Self-pay

## 2022-02-10 ENCOUNTER — Inpatient Hospital Stay
Admission: EM | Admit: 2022-02-10 | Discharge: 2022-02-14 | DRG: 853 | Disposition: A | Discharge status: Home Health | Payer: No Typology Code available for payment source | Attending: Osteopathic Medicine | Admitting: Osteopathic Medicine

## 2022-02-10 ENCOUNTER — Emergency Department: Payer: No Typology Code available for payment source

## 2022-02-10 ENCOUNTER — Ambulatory Visit (INDEPENDENT_AMBULATORY_CARE_PROVIDER_SITE_OTHER): Payer: PPO | Admitting: Vascular Surgery

## 2022-02-10 ENCOUNTER — Inpatient Hospital Stay: Payer: No Typology Code available for payment source

## 2022-02-10 ENCOUNTER — Encounter: Payer: Self-pay | Admitting: Surgery

## 2022-02-10 DIAGNOSIS — A419 Sepsis, unspecified organism: Principal | ICD-10-CM | POA: Diagnosis present

## 2022-02-10 DIAGNOSIS — J189 Pneumonia, unspecified organism: Secondary | ICD-10-CM | POA: Diagnosis present

## 2022-02-10 DIAGNOSIS — I4819 Other persistent atrial fibrillation: Secondary | ICD-10-CM | POA: Diagnosis present

## 2022-02-10 DIAGNOSIS — E669 Obesity, unspecified: Secondary | ICD-10-CM | POA: Diagnosis present

## 2022-02-10 DIAGNOSIS — R6520 Severe sepsis without septic shock: Secondary | ICD-10-CM | POA: Diagnosis present

## 2022-02-10 DIAGNOSIS — R001 Bradycardia, unspecified: Secondary | ICD-10-CM | POA: Diagnosis present

## 2022-02-10 DIAGNOSIS — Z86718 Personal history of other venous thrombosis and embolism: Secondary | ICD-10-CM

## 2022-02-10 DIAGNOSIS — D509 Iron deficiency anemia, unspecified: Secondary | ICD-10-CM | POA: Diagnosis present

## 2022-02-10 DIAGNOSIS — J811 Chronic pulmonary edema: Secondary | ICD-10-CM | POA: Diagnosis present

## 2022-02-10 DIAGNOSIS — R011 Cardiac murmur, unspecified: Secondary | ICD-10-CM | POA: Diagnosis present

## 2022-02-10 DIAGNOSIS — E785 Hyperlipidemia, unspecified: Secondary | ICD-10-CM | POA: Diagnosis present

## 2022-02-10 DIAGNOSIS — J309 Allergic rhinitis, unspecified: Secondary | ICD-10-CM | POA: Diagnosis present

## 2022-02-10 DIAGNOSIS — I2510 Atherosclerotic heart disease of native coronary artery without angina pectoris: Secondary | ICD-10-CM | POA: Diagnosis present

## 2022-02-10 DIAGNOSIS — J9811 Atelectasis: Secondary | ICD-10-CM | POA: Diagnosis present

## 2022-02-10 DIAGNOSIS — J95821 Acute postprocedural respiratory failure: Secondary | ICD-10-CM | POA: Diagnosis present

## 2022-02-10 DIAGNOSIS — N138 Other obstructive and reflux uropathy: Secondary | ICD-10-CM | POA: Diagnosis present

## 2022-02-10 DIAGNOSIS — M75122 Complete rotator cuff tear or rupture of left shoulder, not specified as traumatic: Secondary | ICD-10-CM | POA: Diagnosis present

## 2022-02-10 DIAGNOSIS — K5900 Constipation, unspecified: Secondary | ICD-10-CM | POA: Diagnosis not present

## 2022-02-10 DIAGNOSIS — K449 Diaphragmatic hernia without obstruction or gangrene: Secondary | ICD-10-CM | POA: Diagnosis present

## 2022-02-10 DIAGNOSIS — R4189 Other symptoms and signs involving cognitive functions and awareness: Secondary | ICD-10-CM | POA: Diagnosis not present

## 2022-02-10 DIAGNOSIS — Z7901 Long term (current) use of anticoagulants: Secondary | ICD-10-CM

## 2022-02-10 DIAGNOSIS — Z87442 Personal history of urinary calculi: Secondary | ICD-10-CM

## 2022-02-10 DIAGNOSIS — Z9049 Acquired absence of other specified parts of digestive tract: Secondary | ICD-10-CM

## 2022-02-10 DIAGNOSIS — R55 Syncope and collapse: Secondary | ICD-10-CM | POA: Diagnosis present

## 2022-02-10 DIAGNOSIS — N401 Enlarged prostate with lower urinary tract symptoms: Secondary | ICD-10-CM | POA: Diagnosis present

## 2022-02-10 DIAGNOSIS — I119 Hypertensive heart disease without heart failure: Secondary | ICD-10-CM | POA: Diagnosis present

## 2022-02-10 DIAGNOSIS — Z6837 Body mass index (BMI) 37.0-37.9, adult: Secondary | ICD-10-CM | POA: Diagnosis not present

## 2022-02-10 DIAGNOSIS — Z886 Allergy status to analgesic agent status: Secondary | ICD-10-CM

## 2022-02-10 DIAGNOSIS — R7989 Other specified abnormal findings of blood chemistry: Secondary | ICD-10-CM

## 2022-02-10 DIAGNOSIS — Z8719 Personal history of other diseases of the digestive system: Secondary | ICD-10-CM

## 2022-02-10 DIAGNOSIS — I959 Hypotension, unspecified: Secondary | ICD-10-CM | POA: Diagnosis present

## 2022-02-10 DIAGNOSIS — E872 Acidosis, unspecified: Secondary | ICD-10-CM | POA: Diagnosis present

## 2022-02-10 DIAGNOSIS — Z20822 Contact with and (suspected) exposure to covid-19: Secondary | ICD-10-CM | POA: Diagnosis present

## 2022-02-10 DIAGNOSIS — Z87891 Personal history of nicotine dependence: Secondary | ICD-10-CM

## 2022-02-10 DIAGNOSIS — Z79899 Other long term (current) drug therapy: Secondary | ICD-10-CM

## 2022-02-10 DIAGNOSIS — K529 Noninfective gastroenteritis and colitis, unspecified: Secondary | ICD-10-CM | POA: Diagnosis present

## 2022-02-10 LAB — CBC
HCT: 30.2 % — ABNORMAL LOW (ref 39.0–52.0)
Hemoglobin: 8.8 g/dL — ABNORMAL LOW (ref 13.0–17.0)
MCH: 26.2 pg (ref 26.0–34.0)
MCHC: 29.1 g/dL — ABNORMAL LOW (ref 30.0–36.0)
MCV: 89.9 fL (ref 80.0–100.0)
Platelets: 295 10*3/uL (ref 150–400)
RBC: 3.36 MIL/uL — ABNORMAL LOW (ref 4.22–5.81)
RDW: 17.3 % — ABNORMAL HIGH (ref 11.5–15.5)
WBC: 13.6 10*3/uL — ABNORMAL HIGH (ref 4.0–10.5)
nRBC: 0 % (ref 0.0–0.2)

## 2022-02-10 LAB — URINALYSIS, ROUTINE W REFLEX MICROSCOPIC
Bilirubin Urine: NEGATIVE
Glucose, UA: NEGATIVE mg/dL
Ketones, ur: 5 mg/dL — AB
Leukocytes,Ua: NEGATIVE
Nitrite: NEGATIVE
Protein, ur: >=300 mg/dL — AB
Specific Gravity, Urine: 1.023 (ref 1.005–1.030)
pH: 5 (ref 5.0–8.0)

## 2022-02-10 LAB — COMPREHENSIVE METABOLIC PANEL
ALT: 10 U/L (ref 0–44)
AST: 31 U/L (ref 15–41)
Albumin: 3.3 g/dL — ABNORMAL LOW (ref 3.5–5.0)
Alkaline Phosphatase: 71 U/L (ref 38–126)
Anion gap: 10 (ref 5–15)
BUN: 21 mg/dL (ref 8–23)
CO2: 24 mmol/L (ref 22–32)
Calcium: 8.8 mg/dL — ABNORMAL LOW (ref 8.9–10.3)
Chloride: 106 mmol/L (ref 98–111)
Creatinine, Ser: 0.92 mg/dL (ref 0.61–1.24)
GFR, Estimated: >60 mL/min (ref >60)
Glucose, Bld: 154 mg/dL — ABNORMAL HIGH (ref 70–99)
Potassium: 5.1 mmol/L (ref 3.5–5.1)
Sodium: 140 mmol/L (ref 135–145)
Total Bilirubin: 1.1 mg/dL (ref 0.3–1.2)
Total Protein: 6.4 g/dL — ABNORMAL LOW (ref 6.5–8.1)

## 2022-02-10 LAB — GLUCOSE, CAPILLARY: Glucose-Capillary: 118 mg/dL — ABNORMAL HIGH (ref 70–99)

## 2022-02-10 LAB — MRSA NEXT GEN BY PCR, NASAL: MRSA by PCR Next Gen: NOT DETECTED

## 2022-02-10 LAB — PROTIME-INR
INR: 1.3 — ABNORMAL HIGH (ref 0.8–1.2)
Prothrombin Time: 16.3 seconds — ABNORMAL HIGH (ref 11.4–15.2)

## 2022-02-10 LAB — CBG MONITORING, ED: Glucose-Capillary: 143 mg/dL — ABNORMAL HIGH (ref 70–99)

## 2022-02-10 LAB — BRAIN NATRIURETIC PEPTIDE: B Natriuretic Peptide: 484 pg/mL — ABNORMAL HIGH (ref 0.0–100.0)

## 2022-02-10 LAB — SARS CORONAVIRUS 2 BY RT PCR: SARS Coronavirus 2 by RT PCR: NEGATIVE

## 2022-02-10 LAB — LACTIC ACID, PLASMA
Lactic Acid, Venous: 4.7 mmol/L (ref 0.5–1.9)
Lactic Acid, Venous: 3.5 mmol/L (ref 0.5–1.9)

## 2022-02-10 LAB — TROPONIN I (HIGH SENSITIVITY): Troponin I (High Sensitivity): 26 ng/L — ABNORMAL HIGH (ref <18)

## 2022-02-10 MED ORDER — VANCOMYCIN HCL IN DEXTROSE 1-5 GM/200ML-% IV SOLN
1000.0000 mg | Freq: Once | INTRAVENOUS | Status: DC
  Filled 2022-02-10: qty 200

## 2022-02-10 MED ORDER — ATROPINE SULFATE 1 MG/ML IV SOLN
INTRAVENOUS | Status: DC | PRN
  Administered 2022-02-10: 1 mg via INTRAVENOUS

## 2022-02-10 MED ORDER — SODIUM CHLORIDE 0.9 % IV BOLUS: 1000.0000 mL | Freq: Once | INTRAVENOUS | Status: DC

## 2022-02-10 MED ORDER — DOCUSATE SODIUM 100 MG PO CAPS
100.0000 mg | ORAL_CAPSULE | Freq: Two times a day (BID) | ORAL | Status: DC | PRN
  Administered 2022-02-13: 100 mg via ORAL
  Filled 2022-02-10: qty 1

## 2022-02-10 MED ORDER — PANTOPRAZOLE SODIUM 40 MG PO TBEC
40.0000 mg | DELAYED_RELEASE_TABLET | Freq: Every day | ORAL | Status: DC
  Administered 2022-02-10 – 2022-02-14 (×5): 40 mg via ORAL
  Filled 2022-02-10 (×5): qty 1

## 2022-02-10 MED ORDER — VANCOMYCIN HCL 2000 MG/400ML IV SOLN
2000.0000 mg | Freq: Once | INTRAVENOUS | Status: AC
  Administered 2022-02-10: 2000 mg via INTRAVENOUS
  Filled 2022-02-10: qty 400

## 2022-02-10 MED ORDER — ENOXAPARIN SODIUM 60 MG/0.6ML IJ SOSY
0.5000 mg/kg | PREFILLED_SYRINGE | INTRAMUSCULAR | Status: DC
  Administered 2022-02-11: 45 mg via SUBCUTANEOUS
  Filled 2022-02-10 (×3): qty 0.6

## 2022-02-10 MED ORDER — NOREPINEPHRINE 4 MG/250ML-% IV SOLN
2.0000 ug/min | INTRAVENOUS | Status: DC
  Administered 2022-02-10: 2 ug/min via INTRAVENOUS
  Filled 2022-02-10: qty 250

## 2022-02-10 MED ORDER — ENOXAPARIN SODIUM 40 MG/0.4ML IJ SOSY: 40.0000 mg | PREFILLED_SYRINGE | INTRAMUSCULAR | Status: DC

## 2022-02-10 MED ORDER — DOPAMINE-DEXTROSE 3.2-5 MG/ML-% IV SOLN: 0.0000 ug/kg/min | INTRAVENOUS | Status: DC

## 2022-02-10 MED ORDER — SODIUM CHLORIDE 0.9 % IV SOLN
250.0000 mL | INTRAVENOUS | Status: DC
  Administered 2022-02-11: 250 mL via INTRAVENOUS

## 2022-02-10 MED ORDER — SODIUM CHLORIDE 0.9 % IV BOLUS
1000.0000 mL | Freq: Once | INTRAVENOUS | Status: AC
  Administered 2022-02-10: 1000 mL via INTRAVENOUS

## 2022-02-10 MED ORDER — POLYETHYLENE GLYCOL 3350 17 G PO PACK
17.0000 g | PACK | Freq: Every day | ORAL | Status: DC | PRN
  Administered 2022-02-13: 17 g via ORAL
  Filled 2022-02-10: qty 1

## 2022-02-10 MED ORDER — SODIUM CHLORIDE 0.9% FLUSH
3.0000 mL | Freq: Two times a day (BID) | INTRAVENOUS | Status: DC
  Administered 2022-02-10 – 2022-02-14 (×7): 3 mL via INTRAVENOUS

## 2022-02-10 MED ORDER — SODIUM CHLORIDE 0.9 % IV SOLN
1.0000 g | Freq: Three times a day (TID) | INTRAVENOUS | Status: DC
  Administered 2022-02-10 – 2022-02-12 (×5): 1 g via INTRAVENOUS
  Filled 2022-02-10 (×5): qty 10
  Filled 2022-02-10: qty 1

## 2022-02-10 MED ORDER — VANCOMYCIN HCL 1750 MG/350ML IV SOLN
1750.0000 mg | INTRAVENOUS | Status: DC
  Filled 2022-02-10: qty 350

## 2022-02-10 MED ORDER — SODIUM CHLORIDE 0.9 % IV SOLN: 1.0000 g | Freq: Two times a day (BID) | INTRAVENOUS | Status: DC

## 2022-02-10 MED ORDER — SODIUM CHLORIDE 0.9 % IV SOLN
INTRAVENOUS | Status: DC | PRN
  Administered 2022-02-10: 1000 mL via INTRAVENOUS
  Administered 2022-02-13: 250 mL via INTRAVENOUS

## 2022-02-10 MED ORDER — IOHEXOL 350 MG/ML SOLN
75.0000 mL | Freq: Once | INTRAVENOUS | Status: AC | PRN
  Administered 2022-02-10: 75 mL via INTRAVENOUS

## 2022-02-10 MED ORDER — LACTATED RINGERS IV SOLN: INTRAVENOUS | Status: AC

## 2022-02-10 MED ORDER — ONDANSETRON HCL 4 MG/2ML IJ SOLN: 4.0000 mg | Freq: Four times a day (QID) | INTRAMUSCULAR | Status: DC | PRN

## 2022-02-10 MED ORDER — METRONIDAZOLE 500 MG/100ML IV SOLN
500.0000 mg | Freq: Once | INTRAVENOUS | Status: AC
  Administered 2022-02-10: 500 mg via INTRAVENOUS
  Filled 2022-02-10: qty 100

## 2022-02-10 MED ORDER — LACTATED RINGERS IV BOLUS
30.0000 mL/kg | Freq: Once | INTRAVENOUS | Status: AC
  Administered 2022-02-10: 2721 mL via INTRAVENOUS

## 2022-02-10 MED ORDER — SODIUM CHLORIDE 0.9 % IV SOLN
2.0000 g | Freq: Once | INTRAVENOUS | Status: AC
  Administered 2022-02-10: 2 g via INTRAVENOUS
  Filled 2022-02-10: qty 12.5

## 2022-02-10 NOTE — Consult Note (Signed)
PHARMACY -  BRIEF ANTIBIOTIC NOTE   Pharmacy has received consult(s) for vancomycin and cefepime from an ED provider.  The patient's profile has been reviewed for ht/wt/allergies/indication/available labs.    One time order(s) placed for Cefepime 2g IV x1 and Vancomycin 2g IV x1  Further antibiotics/pharmacy consults should be ordered by admitting physician if indicated.                       Thank you, Darrick Penna 02/10/2022  4:22 PM

## 2022-02-10 NOTE — Progress Notes (Signed)
Per primary RN patient has 2 PIV, will consult IV team when needed.

## 2022-02-10 NOTE — ED Triage Notes (Signed)
Pt bib ems from home with reports of hypotension and bradycardia. Initial call was for cardiac arrest but on scene pt with faint pulse. Pt hypoxic on scene in the low 80's. Assisted respiration on scene with pt becoming more alert. Pt arrives on NRB at 97%. Pt had a nerve block yesterday. Lungs clear, diminished on RLL. Given 100cc NS en route. Afib with a rate of 38-64. Alert to verbal stimuli.

## 2022-02-10 NOTE — Progress Notes (Signed)
PHARMACIST - PHYSICIAN COMMUNICATION  CONCERNING:  Enoxaparin (Lovenox) for DVT Prophylaxis    RECOMMENDATION: Patient was prescribed enoxaprin '40mg'$  q24 hours for VTE prophylaxis.   There were no vitals filed for this visit.  There is no height or weight on file to calculate BMI.  Estimated Creatinine Clearance: 68.7 mL/min (by C-G formula based on SCr of 0.92 mg/dL).   Based on [place] patient is candidate for enoxaparin 0.'5mg'$ /kg TBW SQ every 24 hours based on BMI being >30.  DESCRIPTION: Pharmacy has adjusted enoxaparin dose per [facility] policy.  Patient is now receiving enoxaparin 45 mg every 24 hours    Darrick Penna, PharmD Clinical Pharmacist  02/10/2022 6:17 PM

## 2022-02-10 NOTE — Progress Notes (Signed)
Patient arrived on unit, settled in room. Report given to night shift.

## 2022-02-10 NOTE — Progress Notes (Signed)
[place] Progress Note Patient Name: Marcus Coleman DOB: [birth year] MRN: [id number]   Date of Service  02/10/2022  HPI/Events of Note  21 M hx of afib, RLE DVT not on anticoagulation, HTN, dyslipidemia, IDA receiving Feraheme. He underwent reverse left total shoulder arthroplasty the day before EBL 100cc. EMS called due to pulselessness but noted to have faint pulse and hypoxic when EMS arrived. He was also noted to be hypotensive and bradycardic in the 30s. Lactic 4.7 without significant electrolyte abnormality. H/H 8.8/30.2. INR 1.3. He was given fluids and started empirically on vancomycin and cefepime.  Seen conversant BP 111/69  HR 66 O2 100% on NRBM on norepinephrine 2 mcg, ongoing LR bolus  eICU Interventions  Treating as sepsis, cultures pending CTA pending Bedside RN to confirm if there were new medications added post op that would have aggravated bradycardia     Intervention Category Evaluation Type: New Patient Evaluation  Judd Lien 02/10/2022, 7:22 PM

## 2022-02-10 NOTE — ED Notes (Signed)
RN unable to get reliable vital sign readings due to limited limb access and peripheral edema.

## 2022-02-10 NOTE — ED Provider Notes (Signed)
[facility] Provider Note    Event Date/Time   First MD Initiated Contact with Patient 02/10/22 1537     (approximate)   History   Respiratory Distress   HPI  Marcus Coleman is a 81 y.o. male  who presents to the emergency department via EMS as emergency traffic. Apparently 911 was called for cardiac arrest however when first responders arrived on seen the patient had a pulse. He was found to be hypoxic. EMS stated that he has become more responsive during transport. The patient denies any chest pain. States that he feels cold. Did have left shoulder surgery yesterday.        Physical Exam   Triage Vital Signs: ED Triage Vitals [02/10/22 1536]  Enc Vitals Group     BP (!) 102/50     Pulse Rate (!) 47     Resp 20     Temp 97.6 F (36.4 C)     Temp Source Axillary     SpO2 100 %     Weight      Height      Head Circumference      Peak Flow      Pain Score      Pain Loc      Pain Edu?      Excl. in [place]?     Most recent vital signs: Vitals:   02/10/22 1541 02/10/22 1543  BP:    Pulse: (!) 40   Resp: 20 (!) 23  Temp:    SpO2: 95%    General: Awake, alert, oriented. CV:  Good peripheral perfusion. Bradycardia. Irregular rhythm.  Resp:  Normal effort. Lungs clear. Abd:  No distention.    ED Results / Procedures / Treatments   Labs (all labs ordered are listed, but only abnormal results are displayed) Labs Reviewed  CBC - Abnormal; Notable for the following components:      Result Value   WBC 13.6 (*)    RBC 3.36 (*)    Hemoglobin 8.8 (*)    HCT 30.2 (*)    MCHC 29.1 (*)    RDW 17.3 (*)    All other components within normal limits  LACTIC ACID, PLASMA - Abnormal; Notable for the following components:   Lactic Acid, Venous 4.7 (*)    All other components within normal limits  PROTIME-INR - Abnormal; Notable for the following components:   Prothrombin Time 16.3 (*)    INR 1.3 (*)    All other components within normal limits   URINALYSIS, ROUTINE W REFLEX MICROSCOPIC - Abnormal; Notable for the following components:   Color, Urine AMBER (*)    APPearance CLOUDY (*)    Hgb urine dipstick SMALL (*)    Ketones, ur 5 (*)    Protein, ur >=300 (*)    Bacteria, UA RARE (*)    Non Squamous Epithelial PRESENT (*)    All other components within normal limits  COMPREHENSIVE METABOLIC PANEL - Abnormal; Notable for the following components:   Glucose, Bld 154 (*)    Calcium 8.8 (*)    Total Protein 6.4 (*)    Albumin 3.3 (*)    All other components within normal limits  LACTIC ACID, PLASMA - Abnormal; Notable for the following components:   Lactic Acid, Venous 3.5 (*)    All other components within normal limits  BRAIN NATRIURETIC PEPTIDE - Abnormal; Notable for the following components:   B Natriuretic Peptide 484.0 (*)  All other components within normal limits  BLOOD GAS, VENOUS - Abnormal; Notable for the following components:   pO2, Ven <31 (*)    All other components within normal limits  GLUCOSE, CAPILLARY - Abnormal; Notable for the following components:   Glucose-Capillary 118 (*)    All other components within normal limits  CBG MONITORING, ED - Abnormal; Notable for the following components:   Glucose-Capillary 143 (*)    All other components within normal limits  TROPONIN I (HIGH SENSITIVITY) - Abnormal; Notable for the following components:   Troponin I (High Sensitivity) 26 (*)    All other components within normal limits  SARS CORONAVIRUS 2 BY RT PCR  CULTURE, BLOOD (ROUTINE X 2)  CULTURE, BLOOD (ROUTINE X 2)  SARS CORONAVIRUS 2 BY RT PCR  SARS CORONAVIRUS 2 (TAT 6-24 HRS)  MRSA NEXT GEN BY PCR, NASAL  INFLUENZA PANEL BY PCR (TYPE A & B)  CBC WITH DIFFERENTIAL/PLATELET  COMPREHENSIVE METABOLIC PANEL  PROCALCITONIN  PROCALCITONIN  LACTIC ACID, PLASMA  BLOOD GAS, ARTERIAL  CBG MONITORING, ED  TROPONIN I (HIGH SENSITIVITY)     EKG  I, Nance Pear, attending physician, personally  viewed and interpreted this EKG  EKG Time: 1542 Rate: 75 Rhythm: atrial fibrillation Axis: normal Intervals: qtc 757 QRS: narrow ST changes: no st elevation Impression: abnormal EKG     RADIOLOGY I independently interpreted and visualized the CXR. My interpretation: Cardiomegaly, no pneumonia Radiology interpretation:  IMPRESSION:  1. Interval mildly increased elevation of the left hemidiaphragm  with mild left basilar atelectasis and possible small left pleural  effusion.  2. Cardiomegaly and hiatal hernia.     PROCEDURES:  Critical Care performed: Yes, see critical care procedure note(s)  Procedures  CRITICAL CARE Performed by: Nance Pear   Total critical care time: 40 minutes  Critical care time was exclusive of separately billable procedures and treating other patients.  Critical care was necessary to treat or prevent imminent or life-threatening deterioration.  Critical care was time spent personally by me on the following activities: development of treatment plan with patient and/or surrogate as well as nursing, discussions with consultants, evaluation of patient's response to treatment, examination of patient, obtaining history from patient or surrogate, ordering and performing treatments and interventions, ordering and review of laboratory studies, ordering and review of radiographic studies, pulse oximetry and re-evaluation of patient's condition.   MEDICATIONS ORDERED IN ED: Medications  atropine injection (1 mg Intravenous Given 02/10/22 1540)  0.9 %  sodium chloride infusion (1,000 mLs Intravenous New Bag/Given 02/10/22 1541)     IMPRESSION / MDM / ASSESSMENT AND PLAN / ED COURSE  I reviewed the triage vital signs and the nursing notes.                              Differential diagnosis includes, but is not limited to, infection, dehydration, anemia, medication side effect.  Patient's presentation is most consistent with acute presentation with  potential threat to life or bodily function.  The patient is on the cardiac monitor to evaluate for evidence of arrhythmia and/or significant heart rate changes.  Patient presented to the emergency department via EMS today because of concerns for decreased responsiveness.  Patient did have pulses on EMS arrival.  He was found to be hypoxic.  On presentation to the emergency department patient was responsive and on nonrebreather.  Here in the emergency department we were having a hard time establishing good  oxygen level readings as well as blood pressure readings.  Did have concern for possible infection and blood work showed both a leukocytosis as well as lactic acidosis.  He was started on broad-spectrum antibiotics and IV fluids.  Given concern for possible continued hypotension and low oxygen levels discussed with Dr. Dillard Cannon with the ICU team who came to evaluate and admit the patient.      FINAL CLINICAL IMPRESSION(S) / ED DIAGNOSES   Final diagnoses:  Decreased responsiveness  Elevated lactic acid level      Note:  This document was prepared using Dragon voice recognition software and may include unintentional dictation errors.    Nance Pear, MD 02/10/22 2123

## 2022-02-10 NOTE — ED Notes (Signed)
MD notified of critical lactic  

## 2022-02-10 NOTE — Consult Note (Signed)
Pharmacy Antibiotic Note  Marcus Coleman is a 81 y.o. male admitted on 02/10/2022 with sepsis.  Pharmacy has been consulted for vancomycin and cefepime dosing.  Plan: Cefepime 2g IV every 8 hours Vancomycin 2g IV x1 followed by vancomycin '1750mg'$  IV every 24 hours Goal AUC 400-550  Est AUC: 504.0  Est Cmax: 37.2 Est Cmin: 11.5 Calculated with SCr 0.92 mg/dL  Continue to monitor and dose adjust antibiotics according to renal function and indication     Temp (24hrs), Avg:98.3 F (36.8 C), Min:97.6 F (36.4 C), Max:99 F (37.2 C)  Recent Labs  Lab 02/07/22 1220 02/10/22 1543  WBC  --  13.6*  CREATININE 0.71 0.92  LATICACIDVEN  --  4.7*    Estimated Creatinine Clearance: 68.7 mL/min (by C-G formula based on SCr of 0.92 mg/dL).    Allergies  Allergen Reactions   Nsaids     Can take Ibuprofen in moderation/high doses or prolonged use causes GI bleed    Antimicrobials this admission: 12/1 vancomycin >>  12/1 cefepime and flagyl x1 ED   Microbiology results: 12/1 BCx: sent 12/1 MRSA PCR: sent  Thank you for allowing pharmacy to be a part of this patient's care.  Darrick Penna 02/10/2022 6:21 PM

## 2022-02-10 NOTE — ED Notes (Signed)
Rn contacted RT for ABG.

## 2022-02-10 NOTE — Anesthesia Postprocedure Evaluation (Signed)
Anesthesia Post Note  Patient: BURNARD ENIS  Procedure(s) Performed: REVERSE SHOULDER ARTHROPLASTY AND BICEPS TENODESIS. (Left: Shoulder)  Patient location during evaluation: PACU Anesthesia Type: Regional Level of consciousness: awake and alert Pain management: pain level controlled Vital Signs Assessment: post-procedure vital signs reviewed and stable Respiratory status: spontaneous breathing, nonlabored ventilation, respiratory function stable and patient connected to nasal cannula oxygen Cardiovascular status: blood pressure returned to baseline and stable Postop Assessment: no apparent nausea or vomiting Anesthetic complications: no   No notable events documented.   Last Vitals:  Vitals:   02/09/22 1439 02/09/22 1552  BP: 132/86 (!) 131/93  Pulse: (!) 114 (!) 111  Resp: 20 18  Temp: 36.6 C   SpO2: 93% 92%    Last Pain:  Vitals:   02/09/22 1552  TempSrc:   PainSc: 0-No pain                 Molli Barrows

## 2022-02-10 NOTE — H&P (Incomplete)
NAME:  Marcus Coleman, MRN:  [id number], DOB:  [birth year], LOS: 0 ADMISSION DATE:  02/10/2022, CONSULTATION DATE:  02/10/22 REFERRING MD:  Nance Pear  CHIEF COMPLAINT:  Unresponsiveness    HPI  81 y.o male with significant PMH as noted below who presented to the ED with chief complaints of unresponsiveness found to be hypotensive and bradycardic.  Per Patient's wife at the bedside and EMS run sheet, EMS was called for unresponsiveness and on arrival to the scene, patient was found supine in recliner and not responding. Pts wife on scene stated that pt. had a nerve block done and left shoulder procedure yesterday which was uneventful.  Wife stated that she found pt. lying in the recliner and would not respond. Pt was initially unresponsive per EMS but then became responsive to painful stimuli. Pt was gray in color with sat in the 80's on room air with a faint pulse. Patient was ventilated with BVM at 25L of O2 with improvement.  ETCO2 nasal canula was placed on pt. Pt was placed on NRB at 15L of O2 with improvement and maintained 97%. 12 lead showed A. Fib at heart rate between 38 and 70. Pt received 100cc of NS fluid via IV bolus and transported to the ED.   ED Course: Initial vital signs showed HR of 47 beats/minute, BP 102/50 mm Hg, the RR 20 breaths/minute, and the oxygen saturation 100% on NRB and a temperature of 97.75F (36.4C). Patient was more responsive and able to follow commands however he remained slightly hypotensive and bradycardic in the 40's.  Pertinent Labs/Diagnostics Findings: Chemistry:Glucose: 154  otherwise unremarkable CBC: WBC: 13.6 Hgb/Hct:8.8/30.2  Other Lab findings:   PCT: negative <0.10 Lactic acid: 4.7 COVID PCR: Negative Venous Blood Gas result:  pO2 <31; pCO2 53; pH 7.31;  HCO3 26.7, %O2 Sat 18.3.  Imaging:  CXR> CTA Chest> pending  Patient given IV fluids and started on broad-spectrum antibiotics Vanco cefepime and Flagyl for sepsis with septic shock.  Patient remained hypotensive therefore was started on Levophed.  (Sepsis reassessment completed). PCCM consulted.  Past Medical History  Allergic rhinitis  Allergic state nsaids  Anemia  Borderline hypertension  Cancer (CMS-HCC) on hand  Chronic diarrhea  DDD (degenerative disc disease)  Hiatal hernia  Hx of colonic polyps (HYPERPLASTIC)  Hyperlipidemia  IDA (iron deficiency anemia) 04/07/2016  GI Bleed on Eliquis DVT ETOH Abuse HLD Low back pain  Low testosterone  Persistent atrial fibrillation (CMS-HCC) 06/01/2021  S/P ERCP 01/23/2013  Vasovagal syncope  Venous disease   Significant Hospital Events   12/1: Admit to ICU with acute hypoxic respiratory failure and sepsis secondary to LLL Pneumonia, and symptomatic bradycardia  Consults:  Ortho  Procedures:  None  Significant Diagnostic Tests:   12/1: Chest Xray>IMPRESSION: 1. Interval mildly increased elevation of the left hemidiaphragm with mild left basilar atelectasis and possible small left pleural effusion. 2. Cardiomegaly and hiatal hernia.  12/1: CTA Chest>IMPRESSION: 1. No evidence for pulmonary embolism. 2. Trace bilateral pleural effusions. 3. Patchy airspace disease in the left lower lobe worrisome for pneumonia. 4. Cardiomegaly. 5. Large hiatal hernia. 6. Small amount of free fluid in the left upper quadrant. 7. Recent left shoulder arthroplasty.  Micro Data:  12/1: SARS-CoV-2 PCR> negative 12/1: Influenza PCR> negative 12/1: Blood culture x2> 12/1: Urine Culture> 12/1: MRSA PCR>>  12/1: Strep pneumo urinary antigen> 12/1: Legionella urinary antigen>  Antimicrobials:  Vancomycin 12/1> Cefepime 12/1>   OBJECTIVE  Blood pressure 111/69, pulse 73, temperature 97.8 F (  36.6 C), temperature source Oral, resp. rate 16, height '5\' 6"'$  (1.676 m), weight 97.6 kg, SpO2 100 %.        Intake/Output Summary (Last 24 hours) at 02/10/2022 2109 Last data filed at 02/10/2022 2000 Gross per 24 hour   Intake 1941.48 ml  Output --  Net 1941.48 ml   Filed Weights   02/10/22 2000  Weight: 97.6 kg     Physical Examination  GENERAL: 81 year-old critically ill patient lying in the bed with no acute distress.  EYES: Pupils equal, round, reactive to light and accommodation. No scleral icterus. Extraocular muscles intact.  HEENT: Head atraumatic, normocephalic. Oropharynx and nasopharynx clear.  NECK:  Supple, no jugular venous distention. No thyroid enlargement, no tenderness.  LUNGS: Decreased  breath sounds bilaterally, no wheezing, rales,or rhonchi. No use of accessory muscles of respiration.  CARDIOVASCULAR: S1, S2 normal. 2/6 systolic murmur , rubs, or gallops.  ABDOMEN: Soft, nontender, nondistended. Bowel sounds present. No organomegaly or mass.  EXTREMITIES: Upper and lower extremities are atraumatic in appearance without tenderness or deformity. No swelling or erythema. Limited ROM in LUE. Capillary refill > 3 seconds in all extremities. Pulses palpable.  NEUROLOGIC:The patient is awake, alert and oriented to person, place, and time with normal speech.  Sensation is intact bilaterally. Cranial nerves are intact. Gait not checked.  PSYCHIATRIC: Appropriate mood and affect.  SKIN: Warm to touch. No obvious rash, lesion, or ulcer.   Labs/imaging that I havepersonally reviewed  (right click and "Reselect all SmartList Selections" daily)     Labs   CBC: Recent Labs  Lab 02/10/22 1543  WBC 13.6*  HGB 8.8*  HCT 30.2*  MCV 89.9  PLT 151    Basic Metabolic Panel: Recent Labs  Lab 02/07/22 1220 02/10/22 1543  NA 141 140  K 4.2 5.1  CL 106 106  CO2 30 24  GLUCOSE 114* 154*  BUN 25* 21  CREATININE 0.71 0.92  CALCIUM 8.9 8.8*   GFR: Estimated Creatinine Clearance: 71.2 mL/min (by C-G formula based on SCr of 0.92 mg/dL). Recent Labs  Lab 02/10/22 1543 02/10/22 1958  WBC 13.6*  --   LATICACIDVEN 4.7* 3.5*    Liver Function Tests: Recent Labs  Lab 02/10/22 1543   AST 31  ALT 10  ALKPHOS 71  BILITOT 1.1  PROT 6.4*  ALBUMIN 3.3*   No results for input(s): "LIPASE", "AMYLASE" in the last 168 hours. No results for input(s): "AMMONIA" in the last 168 hours.  ABG    Component Value Date/Time   HCO3 26.7 02/10/2022 1827   ACIDBASEDEF 0.4 02/10/2022 1827   O2SAT 18.3 02/10/2022 1827     Coagulation Profile: Recent Labs  Lab 02/10/22 1543  INR 1.3*    Cardiac Enzymes: No results for input(s): "CKTOTAL", "CKMB", "CKMBINDEX", "TROPONINI" in the last 168 hours.  HbA1C: No results found for: "HGBA1C"  CBG: Recent Labs  Lab 02/10/22 1538 02/10/22 1907  GLUCAP 143* 118*    Review of Systems:   UNABLE TO OBTAIN PATIENT IS A POOR HISTORIAN  Past Medical History  He,  has a past medical history of Aortic atherosclerosis ([place]), Atrial fibrillation ([place]), BPH with obstruction/lower urinary tract symptoms, Bronchitis (01/27/2022), CAD (coronary artery disease), Cardiomegaly, Chronic diarrhea, Compression fracture of thoracic vertebra (HCC), DDD (degenerative disc disease), cervical, Diverticulosis, DVT (deep venous thrombosis) ([place]) (03/31/2019), ETOH abuse, GI bleed, Heart murmur, Hiatal hernia, Hyperlipidemia, Hypertension, IDA (iron deficiency anemia), Low testosterone, Nephrolithiasis, Pneumonia, Rotator cuff arthropathy, left, 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   Surgical History    Past Surgical History:  Procedure Laterality Date   boils removed from right forearm  [place] EXTRACTION Left 2008   catracts Bilateral    left 2008, right 2013   CHOLECYSTECTOMY     COLONOSCOPY WITH PROPOFOL N/A 06/26/2016   Procedure: COLONOSCOPY WITH PROPOFOL;  Surgeon: Manya Silvas, MD;  Location: [facility] ENDOSCOPY;  Service: Endoscopy;  Laterality: N/A;   COLONOSCOPY WITH PROPOFOL N/A 07/03/2019   Procedure: COLONOSCOPY WITH PROPOFOL;  Surgeon: [name], MD;  Location[facility]  ENDOSCOPY;  Service: Gastroenterology;  Laterality: N/A;   ENDOSCOPIC RETROGRADE CHOLANGIOPANCREATOGRAPHY (ERCP) WITH PROPOFOL     ENDOSCOPIC VEIN LASER TREATMENT     right leg 05/2015, left leg 09/2015   ESOPHAGOGASTRODUODENOSCOPY N/A 04/19/2019   Procedure: ESOPHAGOGASTRODUODENOSCOPY (EGD);  Surgeon: [name], MD;  Location: [facility] ENDOSCOPY;  Service: Gastroenterology;  Laterality: N/A;   ESOPHAGOGASTRODUODENOSCOPY (EGD) WITH PROPOFOL N/A 07/03/2019   Procedure: ESOPHAGOGASTRODUODENOSCOPY (EGD) WITH PROPOFOL;  Surgeon: [name], MD;  Location: [facility] ENDOSCOPY;  Service: Gastroenterology;  Laterality: N/A;   ESOPHAGOGASTRODUODENOSCOPY (EGD) WITH PROPOFOL N/A 08/07/2019   Procedure: ESOPHAGOGASTRODUODENOSCOPY (EGD) WITH PROPOFOL;  Surgeon: Milus Banister, MD;  Location: [facility] ENDOSCOPY;  Service: Endoscopy;  Laterality: N/A;   EUS N/A 08/07/2019   Procedure: UPPER ENDOSCOPIC ULTRASOUND (EUS) RADIAL;  Surgeon: Milus Banister, MD;  Location: [facility] ENDOSCOPY;  Service: Endoscopy;  Laterality: N/A;   eyelid surgery     FOOT SURGERY Right 2009   HEMORROIDECTOMY     HERNIA REPAIR     mrercp  2014   NOSE SURGERY  2011   REVERSE SHOULDER ARTHROPLASTY Left 02/09/2022   Procedure: REVERSE SHOULDER ARTHROPLASTY AND BICEPS TENODESIS.;  Surgeon: Corky Mull, MD;  Location: [facility] ORS;  Service: Orthopedics;  Laterality: Left;   RHINOPLASTY     squamous cell carcinoma removed from right hand  2013   TONSILLECTOMY     WISDOM TOOTH EXTRACTION       Social History   reports that he quit smoking about 35 years ago. His smoking use included cigarettes. He has a 25.00 pack-year smoking history. He has never used smokeless tobacco. He reports current alcohol use of about 14.0 standard drinks of alcohol per week. He reports that he does not use drugs.   Family History   His family history includes Diabetes in his mother; Hypothyroidism in his mother; Lung cancer in his mother.   Allergies Allergies   Allergen Reactions   Nsaids     Can take Ibuprofen in moderation/high doses or prolonged use causes GI bleed     Home Medications  Prior to Admission medications   Medication Sig Start Date End Date Taking? Authorizing Provider  acetaminophen (TYLENOL) 650 MG CR tablet Take 650 mg by mouth every 8 (eight) hours as needed for pain.   Yes [provider]  clindamycin (CLEOCIN T) 1 % external solution Apply 1 % topically as needed. To head 01/27/20  Yes [provider]  cetirizine (ZYRTEC) 10 MG tablet Take 10 mg by mouth every morning.    [provider]  dilTIAZem HCl ER 300 MG TB24 Take 1 tablet by mouth every morning.    [provider]  furosemide (LASIX) 20 MG tablet Take 40 mg by mouth every morning. 04/18/21   [provider]  HYDROcodone-acetaminophen (NORCO/VICODIN) 5-325 MG tablet Take 1-2 tablets by mouth every 6 (six) hours  as needed for moderate pain or severe pain. 02/09/22 02/09/23  Poggi, Marshall Cork, MD  metoprolol succinate (TOPROL-XL) 50 MG 24 hr tablet Take 75 mg by mouth every morning. 1.5 tab 12/23/20   [provider]  timolol (TIMOPTIC) 0.5 % ophthalmic solution Place 1 drop into both eyes daily.  11/21/18   [provider]  Scheduled Meds:  [START ON 02/11/2022] enoxaparin (LOVENOX) injection  0.5 mg/kg Subcutaneous Q24H   pantoprazole  40 mg Oral Daily   sodium chloride flush  3 mL Intravenous Q12H   Continuous Infusions:  sodium chloride Stopped (02/10/22 1718)   sodium chloride     ceFEPime (MAXIPIME) IV 1 g (02/10/22 2252)   lactated ringers 150 mL/hr at 02/10/22 2000   norepinephrine (LEVOPHED) Adult infusion Stopped (02/10/22 2030)   sodium chloride     [START ON 02/11/2022] vancomycin     PRN Meds:.sodium chloride, atropine, docusate sodium, ondansetron (ZOFRAN) IV, polyethylene glycol   Active Hospital Problem list   Acute hypoxic respiratory failure secondary to left lower lobe pneumonia and  pulmonary edema Sepsis secondary to pneumonia Persistent atrial fibrillation Hypertension Symptomatic bradycardia Chronic IDA  Assessment & Plan:  Acute Hypoxic Respiratory Failure secondary to LLL CAP & Mild Pulmonary Edema -Supplemental O2 to maintain SpO2 > 90% -Intermittent chest x-ray & ABG PRN -High risk Intubation -Ensure adequate pulmonary hygiene  -Continue CAP coverage: cefepime/Vancomycin -budesonide inhaler/nebs BID, bronchodilators PRN -consider NPPV BIPAP PRN  Sepsis due suspected CAP Lactic: 4.7, Baseline PCT: <0.10, UA:  Initial interventions/workup included: 1 L of NS& Cefepime/ Vancomycin/ Metronidazole -Supplemental oxygen as needed, to maintain SpO2 > 90% -F/u cultures, trend lactic/ PCT -Trend PCT, monitor WBC/ fever curve -IV antibiotics: cefepime  & vancomycin  -IVF hydration as needed -Pressors for MAP goal >65 -Strict I/O's   Symptomatic Bradycardia Presenting with HR in the low 40's, hypotension and ?syncopal episode PMH: Persistent Atrial fibrillation not anticoagulated Hx GI Bleed while on Eliquis, HTN EKG shows significant prolonged QTC>750 CTA negative for PE -Check TSH, Free T4 -Monitor BMP+Mag -Hold AV nodal drugs and QTC prolonging medication -Hold home Metoprolol and Diltiazem for now -Obtain 2D Echo -Cardiology consult  Rotator cuff arthropathy, left. Nontraumatic complete tear of left rotator cuff  S/p left reverse total shoulder arthroplasty with Dr. Roland Rack on 02/09/2022.  -Continue splint -Will get Ortho to follow while inpatient  Chronic IDA -Hgb 8.8 on admission -Check Iron panel -He last received Feraheme on November 07, 2021.  -Follows with oncology -Monitor CBC  Best practice:  Diet:  Oral Pain/Anxiety/Delirium protocol (if indicated): No VAP protocol (if indicated): Not indicated DVT prophylaxis: LMWH GI prophylaxis: PPI Glucose control:  SSI No Central venous access:  N/A Arterial line:  N/A Foley:  N/A Mobility:   bed rest  PT consulted: N/A Last date of multidisciplinary goals of care discussion [12/1] Code Status:  full code Disposition: ICU   = Goals of Care = Code Status Order: FULL  Primary Emergency Contact: Sharp, Home Phone: [phone] Wishes to pursue full aggressive treatment and intervention options, including CPR and intubation, but goals of care will be addressed on going with family if that should become necessary.  Critical care time: 45 minutes       Rufina Falco, DNP, CCRN, FNP-C, AGACNP-BC Acute Care Nurse Practitioner [facility]  PCCM on call pager [phone] until 7 am

## 2022-02-11 ENCOUNTER — Inpatient Hospital Stay: Payer: No Typology Code available for payment source

## 2022-02-11 ENCOUNTER — Inpatient Hospital Stay
Admit: 2022-02-11 | Discharge: 2022-02-11 | Disposition: A | Discharge status: Home / Self Care | Payer: No Typology Code available for payment source | Attending: Nurse Practitioner | Admitting: Nurse Practitioner

## 2022-02-11 DIAGNOSIS — R6520 Severe sepsis without septic shock: Secondary | ICD-10-CM | POA: Diagnosis not present

## 2022-02-11 DIAGNOSIS — R4189 Other symptoms and signs involving cognitive functions and awareness: Secondary | ICD-10-CM | POA: Diagnosis not present

## 2022-02-11 DIAGNOSIS — A419 Sepsis, unspecified organism: Secondary | ICD-10-CM | POA: Diagnosis not present

## 2022-02-11 DIAGNOSIS — R7989 Other specified abnormal findings of blood chemistry: Secondary | ICD-10-CM

## 2022-02-11 LAB — BLOOD GAS, ARTERIAL
Acid-Base Excess: 0.3 mmol/L (ref 0.0–2.0)
Bicarbonate: 25.4 mmol/L (ref 20.0–28.0)
FIO2: 40 %
O2 Saturation: 98.7 %
Patient temperature: 37
pCO2 arterial: 42 mmHg (ref 32–48)
pH, Arterial: 7.39 (ref 7.35–7.45)
pO2, Arterial: 105 mmHg (ref 83–108)

## 2022-02-11 LAB — CBC WITH DIFFERENTIAL/PLATELET
Abs Immature Granulocytes: 0.05 10*3/uL (ref 0.00–0.07)
Basophils Absolute: 0 10*3/uL (ref 0.0–0.1)
Basophils Relative: 0 %
Eosinophils Absolute: 0 10*3/uL (ref 0.0–0.5)
Eosinophils Relative: 0 %
HCT: 29 % — ABNORMAL LOW (ref 39.0–52.0)
Hemoglobin: 8.6 g/dL — ABNORMAL LOW (ref 13.0–17.0)
Immature Granulocytes: 1 %
Lymphocytes Relative: 9 %
Lymphs Abs: 1 10*3/uL (ref 0.7–4.0)
MCH: 26.2 pg (ref 26.0–34.0)
MCHC: 29.7 g/dL — ABNORMAL LOW (ref 30.0–36.0)
MCV: 88.4 fL (ref 80.0–100.0)
Monocytes Absolute: 1.4 10*3/uL — ABNORMAL HIGH (ref 0.1–1.0)
Monocytes Relative: 13 %
Neutro Abs: 8.2 10*3/uL — ABNORMAL HIGH (ref 1.7–7.7)
Neutrophils Relative %: 77 %
Platelets: 274 10*3/uL (ref 150–400)
RBC: 3.28 MIL/uL — ABNORMAL LOW (ref 4.22–5.81)
RDW: 17.7 % — ABNORMAL HIGH (ref 11.5–15.5)
WBC: 10.7 10*3/uL — ABNORMAL HIGH (ref 4.0–10.5)
nRBC: 0 % (ref 0.0–0.2)

## 2022-02-11 LAB — STREP PNEUMONIAE URINARY ANTIGEN: Strep Pneumo Urinary Antigen: NEGATIVE

## 2022-02-11 LAB — PROCALCITONIN
Procalcitonin: <0.1 ng/mL
Procalcitonin: <0.1 ng/mL

## 2022-02-11 LAB — RETICULOCYTES
Immature Retic Fract: 38.2 % — ABNORMAL HIGH (ref 2.3–15.9)
RBC.: 3.31 MIL/uL — ABNORMAL LOW (ref 4.22–5.81)
Retic Count, Absolute: 64.5 10*3/uL (ref 19.0–186.0)
Retic Ct Pct: 2 % (ref 0.4–3.1)

## 2022-02-11 LAB — ECHOCARDIOGRAM COMPLETE
Area-P 1/2: 3.79 cm2
Height: 66 in
S' Lateral: 3.7 cm
Weight: 3442.7 oz

## 2022-02-11 LAB — COMPREHENSIVE METABOLIC PANEL
ALT: 30 U/L (ref 0–44)
AST: 48 U/L — ABNORMAL HIGH (ref 15–41)
Albumin: 3.3 g/dL — ABNORMAL LOW (ref 3.5–5.0)
Alkaline Phosphatase: 64 U/L (ref 38–126)
Anion gap: 6 (ref 5–15)
BUN: 25 mg/dL — ABNORMAL HIGH (ref 8–23)
CO2: 25 mmol/L (ref 22–32)
Calcium: 8.4 mg/dL — ABNORMAL LOW (ref 8.9–10.3)
Chloride: 109 mmol/L (ref 98–111)
Creatinine, Ser: 0.78 mg/dL (ref 0.61–1.24)
GFR, Estimated: >60 mL/min (ref >60)
Glucose, Bld: 107 mg/dL — ABNORMAL HIGH (ref 70–99)
Potassium: 4.6 mmol/L (ref 3.5–5.1)
Sodium: 140 mmol/L (ref 135–145)
Total Bilirubin: 0.8 mg/dL (ref 0.3–1.2)
Total Protein: 5.9 g/dL — ABNORMAL LOW (ref 6.5–8.1)

## 2022-02-11 LAB — LACTIC ACID, PLASMA
Lactic Acid, Venous: 1.8 mmol/L (ref 0.5–1.9)
Lactic Acid, Venous: 1.5 mmol/L (ref 0.5–1.9)
Lactic Acid, Venous: 2.1 mmol/L (ref 0.5–1.9)

## 2022-02-11 LAB — T4, FREE: Free T4: 0.7 ng/dL (ref 0.61–1.12)

## 2022-02-11 LAB — TSH: TSH: 3.338 u[IU]/mL (ref 0.350–4.500)

## 2022-02-11 LAB — MAGNESIUM: Magnesium: 1.9 mg/dL (ref 1.7–2.4)

## 2022-02-11 LAB — IRON AND TIBC
Iron: 23 ug/dL — ABNORMAL LOW (ref 45–182)
Saturation Ratios: 5 % — ABNORMAL LOW (ref 17.9–39.5)
TIBC: 456 ug/dL — ABNORMAL HIGH (ref 250–450)
UIBC: 433 ug/dL

## 2022-02-11 LAB — TROPONIN I (HIGH SENSITIVITY)
Troponin I (High Sensitivity): 22 ng/L — ABNORMAL HIGH (ref <18)
Troponin I (High Sensitivity): 20 ng/L — ABNORMAL HIGH (ref <18)
Troponin I (High Sensitivity): 16 ng/L (ref <18)

## 2022-02-11 LAB — FOLATE: Folate: 7 ng/mL (ref >5.9)

## 2022-02-11 LAB — PHOSPHORUS: Phosphorus: 3.4 mg/dL (ref 2.5–4.6)

## 2022-02-11 LAB — FERRITIN: Ferritin: 19 ng/mL — ABNORMAL LOW (ref 24–336)

## 2022-02-11 LAB — BRAIN NATRIURETIC PEPTIDE: B Natriuretic Peptide: 551 pg/mL — ABNORMAL HIGH (ref 0.0–100.0)

## 2022-02-11 LAB — VITAMIN B12: Vitamin B-12: 333 pg/mL (ref 180–914)

## 2022-02-11 MED ORDER — ORAL CARE MOUTH RINSE: 15.0000 mL | OROMUCOSAL | Status: DC | PRN

## 2022-02-11 MED ORDER — CHLORHEXIDINE GLUCONATE CLOTH 2 % EX PADS
6.0000 | MEDICATED_PAD | Freq: Every day | CUTANEOUS | Status: DC
  Administered 2022-02-11 – 2022-02-12 (×2): 6 via TOPICAL

## 2022-02-11 MED ORDER — TAMSULOSIN HCL 0.4 MG PO CAPS
0.4000 mg | ORAL_CAPSULE | Freq: Every day | ORAL | Status: DC
  Administered 2022-02-11 – 2022-02-14 (×4): 0.4 mg via ORAL
  Filled 2022-02-11 (×4): qty 1

## 2022-02-11 MED ORDER — SODIUM CHLORIDE 0.9 % IV SOLN: INTRAVENOUS | Status: AC

## 2022-02-11 MED ORDER — IPRATROPIUM-ALBUTEROL 0.5-2.5 (3) MG/3ML IN SOLN: 3.0000 mL | Freq: Four times a day (QID) | RESPIRATORY_TRACT | Status: DC | PRN

## 2022-02-11 MED ORDER — FUROSEMIDE 10 MG/ML IJ SOLN
20.0000 mg | Freq: Once | INTRAMUSCULAR | Status: AC
  Administered 2022-02-11: 20 mg via INTRAVENOUS
  Filled 2022-02-11: qty 2

## 2022-02-11 MED ORDER — FLUTICASONE PROPIONATE 50 MCG/ACT NA SUSP
1.0000 | Freq: Every day | NASAL | Status: DC
  Administered 2022-02-11 – 2022-02-14 (×4): 1 via NASAL
  Filled 2022-02-11: qty 16

## 2022-02-11 MED ORDER — BUDESONIDE 0.25 MG/2ML IN SUSP
0.2500 mg | Freq: Two times a day (BID) | RESPIRATORY_TRACT | Status: DC
  Administered 2022-02-11 – 2022-02-14 (×7): 0.25 mg via RESPIRATORY_TRACT
  Filled 2022-02-11 (×7): qty 2

## 2022-02-11 MED ORDER — IPRATROPIUM-ALBUTEROL 0.5-2.5 (3) MG/3ML IN SOLN
3.0000 mL | Freq: Four times a day (QID) | RESPIRATORY_TRACT | Status: DC
  Administered 2022-02-11 – 2022-02-13 (×9): 3 mL via RESPIRATORY_TRACT
  Filled 2022-02-11 (×9): qty 3

## 2022-02-11 NOTE — Progress Notes (Signed)
  Echocardiogram 2D Echocardiogram has been performed.  Claretta Fraise 02/11/2022, 8:10 AM

## 2022-02-11 NOTE — Progress Notes (Signed)
CARDIOLOGY CONSULT NOTE               Patient ID: Marcus Coleman MRN: [id number] DOB/AGE: 11-25-1940 80 y.o.  Admit date: 02/10/2022 Referring Physician Dillard Cannon, [place]  Primary Physician Ardyth Man, PA  Primary Cardiologist Dr. Saralyn Pilar Reason for Consultation shortness of breath dyspnea atrial fibrillation.  HPI: Patient is a 81 year old male with previous atrial fibrillation hypertension shortness of breath coronary artery disease previous DVT presented postop with dyspnea shortness of breath and respiratory failure chest x-ray suggest probable new onset pneumonia patient was treated with antibiotic therapy he has had some bradycardia with atrial fibrillation so cardiology was consulted for further evaluation and management for persistent bradycardia patient recently asymptomatic  Review of systems complete and found to be negative unless listed above     Past Medical History:  Diagnosis Date   Aortic atherosclerosis ([place])    Atrial fibrillation ([place])    a.) CHA2DS2VASc = 6 (age x2, HTN, DVT x2, vascular disease history);  b.) rate/rhythm maintained on oral diltiazem + metoprolol succinate; chronic anticoagulation (apixaban) discontinued due to GI bleeding   BPH with obstruction/lower urinary tract symptoms    Bronchitis 01/27/2022   CAD (coronary artery disease)    Cardiomegaly    Chronic diarrhea    Compression fracture of thoracic vertebra ([place])    a.) CT renal 12/30/2021: chronic appearing T11-T12 (70% height loss)   DDD (degenerative disc disease), cervical    Diverticulosis    DVT (deep venous thrombosis) ([place]) 03/31/2019   a.) anticoagulation intiated (apixaban), however subsequently discontinued due to GI bleeding   ETOH abuse    GI bleed    Heart murmur    Hiatal hernia    Hyperlipidemia    Hypertension    IDA (iron deficiency anemia)    Low testosterone    Nephrolithiasis    Pneumonia    Rotator cuff arthropathy, left    Squamous cell cancer of skin  of hand    Vasovagal syncope     Past Surgical History:  Procedure Laterality Date   boils removed from right forearm  [place] EXTRACTION Left 2008   catracts Bilateral    left 2008, right 2013   CHOLECYSTECTOMY     COLONOSCOPY WITH PROPOFOL N/A 06/26/2016   Procedure: COLONOSCOPY WITH PROPOFOL;  Surgeon: Manya Silvas, MD;  Location: [facility] ENDOSCOPY;  Service: Endoscopy;  Laterality: N/A;   COLONOSCOPY WITH PROPOFOL N/A 07/03/2019   Procedure: COLONOSCOPY WITH PROPOFOL;  Surgeon: [name], MD;  Location: [facility] ENDOSCOPY;  Service: Gastroenterology;  Laterality: N/A;   ENDOSCOPIC RETROGRADE CHOLANGIOPANCREATOGRAPHY (ERCP) WITH PROPOFOL     ENDOSCOPIC VEIN LASER TREATMENT     right leg 05/2015, left leg 09/2015   ESOPHAGOGASTRODUODENOSCOPY N/A 04/19/2019   Procedure: ESOPHAGOGASTRODUODENOSCOPY (EGD);  Surgeon: [name], MD;  Location: [facility] ENDOSCOPY;  Service: Gastroenterology;  Laterality: N/A;   ESOPHAGOGASTRODUODENOSCOPY (EGD) WITH PROPOFOL N/A 07/03/2019   Procedure: ESOPHAGOGASTRODUODENOSCOPY (EGD) WITH PROPOFOL;  Surgeon: [name], MD;  Location: [facility] ENDOSCOPY;  Service: Gastroenterology;  Laterality: N/A;   ESOPHAGOGASTRODUODENOSCOPY (EGD) WITH PROPOFOL N/A 08/07/2019   Procedure: ESOPHAGOGASTRODUODENOSCOPY (EGD) WITH PROPOFOL;  Surgeon: Milus Banister, MD;  Location: [facility] ENDOSCOPY;  Service: Endoscopy;  Laterality: N/A;   EUS N/A 08/07/2019   Procedure: UPPER ENDOSCOPIC ULTRASOUND (EUS) RADIAL;  Surgeon: Milus Banister, MD;  Location: [facility] ENDOSCOPY;  Service: Endoscopy;  Laterality: N/A;   eyelid surgery     FOOT SURGERY Right 2009  HEMORROIDECTOMY     HERNIA REPAIR     mrercp  2014   NOSE SURGERY  2011   REVERSE SHOULDER ARTHROPLASTY Left 02/09/2022   Procedure: REVERSE SHOULDER ARTHROPLASTY AND BICEPS TENODESIS.;  Surgeon: Corky Mull, MD;  Location: [facility] ORS;  Service: Orthopedics;  Laterality: Left;   RHINOPLASTY      squamous cell carcinoma removed from right hand  2013   TONSILLECTOMY     WISDOM TOOTH EXTRACTION      Medications Prior to Admission  Medication Sig Dispense Refill Last Dose   acetaminophen (TYLENOL) 650 MG CR tablet Take 650 mg by mouth every 8 (eight) hours as needed for pain.   Past Week   clindamycin (CLEOCIN T) 1 % external solution Apply 1 % topically as needed. To head   Past Week   cetirizine (ZYRTEC) 10 MG tablet Take 10 mg by mouth every morning.   02/08/2022   dilTIAZem HCl ER 300 MG TB24 Take 1 tablet by mouth every morning.   02/08/2022   furosemide (LASIX) 20 MG tablet Take 40 mg by mouth every morning.   02/08/2022   HYDROcodone-acetaminophen (NORCO/VICODIN) 5-325 MG tablet Take 1-2 tablets by mouth every 6 (six) hours as needed for moderate pain or severe pain. 30 tablet 0 02/08/2022   metoprolol succinate (TOPROL-XL) 50 MG 24 hr tablet Take 75 mg by mouth every morning. 1.5 tab   02/08/2022   timolol (TIMOPTIC) 0.5 % ophthalmic solution Place 1 drop into both eyes daily.    02/08/2022   Social History   Socioeconomic History   Marital status: Married    Spouse name: Not on file   Number of children: Not on file   Years of education: Not on file   Highest education level: Not on file  Occupational History   Not on file  Tobacco Use   Smoking status: Former    Packs/day: 1.00    Years: 25.00    Total pack years: 25.00    Types: Cigarettes    Quit date: 12/05/1986    Years since quitting: 35.2   Smokeless tobacco: Never  Vaping Use   Vaping Use: Never used  Substance and Sexual Activity   Alcohol use: Yes    Alcohol/week: 14.0 standard drinks of alcohol    Types: 14 Cans of beer per week    Comment: 2 beers per dayOR  a couple glasses of wine   Drug use: No   Sexual activity: Not on file  Other Topics Concern   Not on file  Social History Narrative   Not on file   Social Determinants of Health   Financial Resource Strain: Not on file  Food Insecurity:  Not on file  Transportation Needs: Not on file  Physical Activity: Not on file  Stress: Not on file  Social Connections: Not on file  Intimate Partner Violence: Not on file    Family History  Problem Relation Age of Onset   Hypothyroidism Mother    Diabetes Mother    Lung cancer Mother       Review of systems complete and found to be negative unless listed above      PHYSICAL EXAM  General: Well developed, well nourished, in no acute distress HEENT:  Normocephalic and atramatic Neck:  No JVD.  Lungs: Clear bilaterally to auscultation and percussion. Heart: HRRR . Normal S1 and S2 without gallops or murmurs.  Abdomen: Bowel sounds are positive, abdomen soft and non-tender  Msk:  Back normal,  normal gait. Normal strength and tone for age. Extremities: No clubbing, cyanosis or edema.   Neuro: Alert and oriented X 3. Psych:  Good affect, responds appropriately  Labs:   Lab Results  Component Value Date   WBC 10.7 (H) 02/11/2022   HGB 8.6 (L) 02/11/2022   HCT 29.0 (L) 02/11/2022   MCV 88.4 02/11/2022   PLT 274 02/11/2022    Recent Labs  Lab 02/11/22 0450  NA 140  K 4.6  CL 109  CO2 25  BUN 25*  CREATININE 0.78  CALCIUM 8.4*  PROT 5.9*  BILITOT 0.8  ALKPHOS 64  ALT 30  AST 48*  GLUCOSE 107*   No results found for: "CKTOTAL", "CKMB", "CKMBINDEX", "TROPONINI" No results found for: "CHOL" No results found for: "HDL" No results found for: "LDLCALC" No results found for: "TRIG" No results found for: "CHOLHDL" No results found for: "LDLDIRECT"    Radiology: CT Angio Chest PE W and/or Wo Contrast  Result Date: 02/10/2022 CLINICAL DATA:  Hypoxemia. EXAM: CT ANGIOGRAPHY CHEST WITH CONTRAST TECHNIQUE: Multidetector CT imaging of the chest was performed using the standard protocol during bolus administration of intravenous contrast. Multiplanar CT image reconstructions and MIPs were obtained to evaluate the vascular anatomy. RADIATION DOSE REDUCTION: This exam was  performed according to the departmental dose-optimization program which includes automated exposure control, adjustment of the mA and/or kV according to patient size and/or use of iterative reconstruction technique. CONTRAST:  44m OMNIPAQUE IOHEXOL 350 MG/ML SOLN COMPARISON:  None Available. FINDINGS: Cardiovascular: Heart is mildly enlarged. Aorta is normal in size. There are atherosclerotic calcifications of the aorta and coronary arteries. There is no pericardial effusion. There is adequate opacification of the pulmonary arteries. No pulmonary embolism identified. Mediastinum/Nodes: There is a large hiatal hernia containing the proximal stomach. No enlarged lymph nodes. Visualized thyroid gland within normal limits. Lungs/Pleura: Trace bilateral pleural effusions. There is compressive atelectasis in the bilateral lower lobes. There is some additional patchy airspace disease in the left lower lobe. There is no pneumothorax. Upper Abdomen: There is trace free fluid in the left upper quadrant. Musculoskeletal: Changes of recent left shoulder arthroplasty present with surrounding soft tissue swelling and air. Review of the MIP images confirms the above findings. IMPRESSION: 1. No evidence for pulmonary embolism. 2. Trace bilateral pleural effusions. 3. Patchy airspace disease in the left lower lobe worrisome for pneumonia. 4. Cardiomegaly. 5. Large hiatal hernia. 6. Small amount of free fluid in the left upper quadrant. 7. Recent left shoulder arthroplasty. Aortic Atherosclerosis (ICD10-I70.0). Electronically Signed   By: ARonney AstersM.D.   On: 02/10/2022 23:44   DG Chest Port 1 View  Result Date: 02/10/2022 CLINICAL DATA:  Sepsis.  Hypotension and bradycardia. EXAM: PORTABLE CHEST 1 VIEW COMPARISON:  Radiographs 04/15/2007.  Abdominopelvic CT 12/30/2021. FINDINGS: 1659 hours. Lower lung volumes and lordotic positioning. Interval mildly increased elevation of the left hemidiaphragm with mild left basilar  atelectasis and a possible small left pleural effusion. The right lung appears clear. The heart is enlarged. There is a moderate size hiatal hernia. No evidence of pneumothorax. Postsurgical changes from recent left shoulder reverse arthroplasty with skin staples and a small amount of soft tissue emphysema. No acute osseous findings are evident. Telemetry leads overlie the chest. IMPRESSION: 1. Interval mildly increased elevation of the left hemidiaphragm with mild left basilar atelectasis and possible small left pleural effusion. 2. Cardiomegaly and hiatal hernia. Electronically Signed   By: WRichardean SaleM.D.   On: 02/10/2022 17:15  DG Shoulder Left Port  Result Date: 02/09/2022 CLINICAL DATA:  Status post reverse shoulder arthroplasty EXAM: LEFT SHOULDER COMPARISON:  None Available. FINDINGS: Postsurgical changes of left reverse total shoulder arthroplasty. Normal alignment without evidence of loosening or periprosthetic fracture. Expected soft tissue changes. IMPRESSION: Postsurgical changes of reverse left total shoulder arthroplasty. No evidence of immediate hardware complication. Electronically Signed   By: Maurine Simmering M.D.   On: 02/09/2022 14:34   [place] OR NERVE BLOCK-IMAGE ONLY [facility])  Result Date: 02/09/2022 There is no interpretation for this exam.  This order is for images obtained during a surgical procedure.  Please See "Surgeries" Tab for more information regarding the procedure.    EKG: Bradycardia atrial fibrillation rate currently 65  ASSESSMENT AND PLAN:  Acute hypoxic respiratory failure Sepsis due to pneumonia Persistent atrial fibrillation Hypertension Symptomatic bradycardia Obesity Status post left shoulder surgery Chronic anemia appears to be iron deficiency . Plan Agree with postop management ICU level care Broad-spectrum antibiotic therapy for what appears to be pneumonia Supplemental oxygen therapy and respiratory support with inhalers CPAP BiPAP oxygen Rate  control for atrial fibrillation as well as anticoagulation Recommend modest weight loss exercise portion control for obesity Hypertension continue current management will hold AV nodal blocking drugs like metoprolol and diltiazem because of bradycardia Bradycardia seems to be reasonably stable no acute indication for permanent pacemaker Echocardiography assessment left ventricular function valvular structures should be indicated  Signed: Yolonda Kida MD 02/11/2022, 12:23 PM

## 2022-02-11 NOTE — Progress Notes (Signed)
Pharmacy Electrolyte Monitoring Consult:  Pharmacy consulted to assist in monitoring and replacing electrolytes in this 81 y.o. male admitted on 02/10/2022 with Respiratory Distress Hx: ETOH, IDA, Afib, chronic diarrhea, etc  Labs:  Sodium (mmol/L)  Date Value  02/11/2022 140   Potassium (mmol/L)  Date Value  02/11/2022 4.6   Magnesium (mg/dL)  Date Value  02/11/2022 1.9   Phosphorus (mg/dL)  Date Value  02/11/2022 3.4   Calcium (mg/dL)  Date Value  02/11/2022 8.4 (L)   Albumin (g/dL)  Date Value  02/11/2022 3.3 (L)    Assessment/Plan: No electrolyte replacement at this time F/u with am labs  Marcus Coleman 02/11/2022 2:36 PM

## 2022-02-11 NOTE — Progress Notes (Signed)
Pulmonary and Critical care    NAME:  Marcus Coleman, MRN:  [id number], DOB:  03/05/1941, LOS: 1 ADMISSION DATE:  02/10/2022,     History of Present Illness:  The patient is a 81 year old male with history of left shoulder surgery done 1 day prior to ER arrival.  Patient came to ER on December 1.  911 was called for apparent cardiac arrest.  EMS found patient to have a pulse unresponsive and hypoxic.  Patient does not remember the events of transport however remembers waking up in the ER.  In the ER patient was found to have blood pressure 102/50 pulse 47, he was found to have left lower lobe pneumonia.  Mental status improved in the ER.  Patient was hypotensive in the ER, lactic acidosis was present 4.7 upon arrival to the ICU patient receive additional fluids and did not require vasopressors.  Bradycardia also improved patient does take Lopressor and Cardizem at baseline and was also taking opiates. December 2 patient improved and was able to transition from bed to bedside commode.  However became bradycardic.  Upon returning to the bed he was briefly unresponsive for 5 to 10 seconds O2 saturation was 94% on nasal cannula and regained consciousness.  I suspect he had syncope secondary to postural hypotension, yesterday wife had reported history of syncope in the past as well.         02/11/2022   12:30 PM 02/11/2022   12:25 PM 02/11/2022   12:00 PM  Vitals with BMI  Systolic 109 97 323  Diastolic 80 73 78  Pulse 75 79 81    FiO2 (%):  [40 %] 40 %   Examination: General: Sleepy but arousable HENT: No icterus or pallor Lungs: Air entry diminished at the left base with crackles Cardiovascular: S1-S2 no murmurs Abdomen: Obese nontender Extremities: Edema present Neuro: Sleepy however responsive    Assessment & Plan:  Severe sepsis Pneumonia Cardiomegaly History of atrial fibrillation however had GI bleed on Eliquis. Vasovagal syncope Hiatal hernia Acute hypoxic respiratory  failure. Symptomatic bradycardia.  Likely secondary to using opiates Lopressor and Cardizem and being postop. Patient had hematuria with intermittent catheterization Plan Obtain repeat labs including troponin lactic acid and chest x-ray after vasovagal syncope today Obtain ABG 500 cc normal saline bolus Patient is awake now. Continue antibiotics for pneumonia.  Best Practice (right click and "Reselect all SmartList Selections" daily)   Diet/type: Regular consistency (see orders) DVT prophylaxis: LMWH GI prophylaxis: PPI Lines: N/A Foley:  N/A Code Status:  full code  Labs   CBC: Recent Labs  Lab 02/10/22 1543 02/11/22 0450  WBC 13.6* 10.7*  NEUTROABS  --  8.2*  HGB 8.8* 8.6*  HCT 30.2* 29.0*  MCV 89.9 88.4  PLT 295 557    Basic Metabolic Panel: Recent Labs  Lab 02/07/22 1220 02/10/22 1543 02/11/22 0450  NA 141 140 140  K 4.2 5.1 4.6  CL 106 106 109  CO2 '30 24 25  '$ GLUCOSE 114* 154* 107*  BUN 25* 21 25*  CREATININE 0.71 0.92 0.78  CALCIUM 8.9 8.8* 8.4*   GFR: Estimated Creatinine Clearance: 81.9 mL/min (by C-G formula based on SCr of 0.78 mg/dL). Recent Labs  Lab 02/10/22 1543 02/10/22 1958 02/11/22 0134 02/11/22 0450  PROCALCITON  --  <0.10  --  <0.10  WBC 13.6*  --   --  10.7*  LATICACIDVEN 4.7* 3.5* 1.8 1.5    Liver Function Tests: Recent Labs  Lab 02/10/22 1543 02/11/22 0450  AST 31 48*  ALT 10 30  ALKPHOS 71 64  BILITOT 1.1 0.8  PROT 6.4* 5.9*  ALBUMIN 3.3* 3.3*   No results for input(s): "LIPASE", "AMYLASE" in the last 168 hours. No results for input(s): "AMMONIA" in the last 168 hours.  ABG    Component Value Date/Time   HCO3 26.7 02/10/2022 1827   ACIDBASEDEF 0.4 02/10/2022 1827   O2SAT 18.3 02/10/2022 1827     Coagulation Profile: Recent Labs  Lab 02/10/22 1543  INR 1.3*    Cardiac Enzymes: No results for input(s): "CKTOTAL", "CKMB", "CKMBINDEX", "TROPONINI" in the last 168 hours.  HbA1C: No results found for:  "HGBA1C"  CBG: Recent Labs  Lab 02/10/22 1538 02/10/22 1907  GLUCAP 143* 118*    RADIOLOGY Narrative & Impression  CLINICAL DATA:  Hypoxemia.   EXAM: CT ANGIOGRAPHY CHEST WITH CONTRAST   TECHNIQUE: Multidetector CT imaging of the chest was performed using the standard protocol during bolus administration of intravenous contrast. Multiplanar CT image reconstructions and MIPs were obtained to evaluate the vascular anatomy.   RADIATION DOSE REDUCTION: This exam was performed according to the departmental dose-optimization program which includes automated exposure control, adjustment of the mA and/or kV according to patient size and/or use of iterative reconstruction technique.   CONTRAST:  69m OMNIPAQUE IOHEXOL 350 MG/ML SOLN   COMPARISON:  None Available.   FINDINGS: Cardiovascular: Heart is mildly enlarged. Aorta is normal in size. There are atherosclerotic calcifications of the aorta and coronary arteries. There is no pericardial effusion.   There is adequate opacification of the pulmonary arteries. No pulmonary embolism identified.   Mediastinum/Nodes: There is a large hiatal hernia containing the proximal stomach. No enlarged lymph nodes. Visualized thyroid gland within normal limits.   Lungs/Pleura: Trace bilateral pleural effusions. There is compressive atelectasis in the bilateral lower lobes. There is some additional patchy airspace disease in the left lower lobe. There is no pneumothorax.   Upper Abdomen: There is trace free fluid in the left upper quadrant.   Musculoskeletal: Changes of recent left shoulder arthroplasty present with surrounding soft tissue swelling and air.   Review of the MIP images confirms the above findings.   IMPRESSION: 1. No evidence for pulmonary embolism. 2. Trace bilateral pleural effusions. 3. Patchy airspace disease in the left lower lobe worrisome for pneumonia. 4. Cardiomegaly. 5. Large hiatal hernia. 6. Small  amount of free fluid in the left upper quadrant. 7. Recent left shoulder arthroplasty.      Review of Systems:   Reported shoulder pain this morning denied chest pain no nausea vomiting.  No focal neurologic weakness.   Past Medical History:  He,  has a past medical history of Aortic atherosclerosis ([place], [place] ([place], BPH with obstruction/lower urinary tract symptoms, Bronchitis (01/27/2022), CAD (coronary artery disease), Cardiomegaly, Chronic diarrhea, Compression fracture of thoracic vertebra ([facility]), DDD (degenerative disc disease), cervical, Diverticulosis, DVT ([place]) ([place] (03/31/2019), ETOH abuse, GI bleed, Heart murmur, Hiatal hernia, Hyperlipidemia, Hypertension, IDA (iron deficiency anemia), Low testosterone, Nephrolithiasis, Pneumonia, Rotator cuff arthropathy, left, 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, and Vasovagal syncope.   Surgical History:   Past Surgical History:  Procedure Laterality Date   bo[place] from right [place]  [place]   catracts Bilateral    left 2008, right 2013   CHOLECYSTECTOMY     COLONOSCOPY WITH PROPOFOL N/A 06/26/2016   Procedure: COLONOSCOPY WITH PROPOFOL;  Surgeon: RManya Silvas  MD;  Location: [facility] ENDOSCOPY;  Service: Endoscopy;  Laterality: N/A;   COLONOSCOPY WITH PROPOFOL N/A 07/03/2019   Procedure: COLONOSCOPY WITH PROPOFOL;  Surgeon: [name], MD;  Location: [facility] ENDOSCOPY;  Service: Gastroenterology;  Laterality: N/A;   ENDOSCOPIC RETROGRADE CHOLANGIOPANCREATOGRAPHY (ERCP) WITH PROPOFOL     ENDOSCOPIC VEIN LASER TREATMENT     right leg 05/2015, left leg 09/2015   ESOPHAGOGASTRODUODENOSCOPY N/A 04/19/2019   Procedure: ESOPHAGOGASTRODUODENOSCOPY (EGD);  Surgeon: [name], MD;  Location: [facility] ENDOSCOPY;  Service: Gastroenterology;  Laterality: N/A;   ESOPHAGOGASTRODUODENOSCOPY (EGD) WITH PROPOFOL N/A 07/03/2019   Procedure:  ESOPHAGOGASTRODUODENOSCOPY (EGD) WITH PROPOFOL;  Surgeon: [name], MD;  [facility] ENDOSCOPY;  Service: Gastroenterology;  Laterality: N/A;   ESOPHAGOGASTRODUODENOSCOPY (EGD) WITH PROPOFOL N/A 08/07/2019   Procedure: ESOPHAGOGASTRODUODENOSCOPY (EGD) WITH PROPOFOL;  Surgeon: Milus Banister, MD;  Location: [facility] ENDOSCOPY;  Service: Endoscopy;  Laterality: N/A;   EUS N/A 08/07/2019   Procedure: UPPER ENDOSCOPIC ULTRASOUND (EUS) RADIAL;  Surgeon: Milus Banister, MD;  Location: [facility] ENDOSCOPY;  Service: Endoscopy;  Laterality: N/A;   eyelid surgery     FOOT SURGERY Right 2009   HEMORROIDECTOMY     HERNIA REPAIR     mrercp  2014   NOSE SURGERY  2011   REVERSE SHOULDER ARTHROPLASTY Left 02/09/2022   Procedure: REVERSE SHOULDER ARTHROPLASTY AND BICEPS TENODESIS.;  Surgeon: Corky Mull, MD;  Location: [facility] ORS;  Service: Orthopedics;  Laterality: Left;   RHINOPLASTY     squamous cell carcinoma removed from right hand  2013   TONSILLECTOMY     WISDOM TOOTH EXTRACTION       Social History:   reports that he quit smoking about 35 years ago. His smoking use included cigarettes. He has a 25.00 pack-year smoking history. He has never used smokeless tobacco. He reports current alcohol use of about 14.0 standard drinks of alcohol per week. He reports that he does not use drugs.   Family History:  His family history includes Diabetes in his mother; Hypothyroidism in his mother; Lung cancer in his mother.   Allergies Allergies  Allergen Reactions   Nsaids     Can take Ibuprofen in moderation/high doses or prolonged use causes GI bleed     Home Medications  Prior to Admission medications   Medication Sig Start Date End Date Taking? Authorizing Provider  acetaminophen (TYLENOL) 650 MG CR tablet Take 650 mg by mouth every 8 (eight) hours as needed for pain.   Yes [provider]  clindamycin (CLEOCIN T) 1 % external solution Apply 1 % topically as needed. To head 01/27/20  Yes  [provider]  cetirizine (ZYRTEC) 10 MG tablet Take 10 mg by mouth every morning.    [provider]  dilTIAZem HCl ER 300 MG TB24 Take 1 tablet by mouth every morning.    [provider]  furosemide (LASIX) 20 MG tablet Take 40 mg by mouth every morning. 04/18/21   [provider]  HYDROcodone-acetaminophen (NORCO/VICODIN) 5-325 MG tablet Take 1-2 tablets by mouth every 6 (six) hours as needed for moderate pain or severe pain. 02/09/22 02/09/23  Poggi, Marshall Cork, MD  metoprolol succinate (TOPROL-XL) 50 MG 24 hr tablet Take 75 mg by mouth every morning. 1.5 tab 12/23/20   [provider]  timolol (TIMOPTIC) 0.5 % ophthalmic solution Place 1 drop into both eyes daily.  11/21/18   [provider]        All care time spent during this encounter 45 minutes.  Spotting to unresponsive episode and syncope. Murel Shenberger MD ([place]) FOR [place] [facility]

## 2022-02-11 NOTE — Consult Note (Signed)
Pharmacy Antibiotic Note  SEANPATRICK MAISANO is a 81 y.o. male admitted on 02/10/2022 with sepsis.  Pharmacy has been consulted for cefepime dosing.  - MRSA PCR negative- Vancomycin discontinued 02/11/22  Plan: Cefepime 2g IV every 8 hours  Continue to monitor and dose adjust antibiotics according to renal function and indication  Height: '5\' 6"'$  (167.6 cm) Weight: 97.6 kg (215 lb 2.7 oz) IBW/kg (Calculated) : 63.8  Temp (24hrs), Avg:98.4 F (36.9 C), Min:97.6 F (36.4 C), Max:99 F (37.2 C)  Recent Labs  Lab 02/07/22 1220 02/10/22 1543 02/10/22 1958 02/11/22 0134 02/11/22 0450 02/11/22 1343  WBC  --  13.6*  --   --  10.7*  --   CREATININE 0.71 0.92  --   --  0.78  --   LATICACIDVEN  --  4.7* 3.5* 1.8 1.5 2.1*     Estimated Creatinine Clearance: 81.9 mL/min (by C-G formula based on SCr of 0.78 mg/dL).    Allergies  Allergen Reactions   Nsaids     Can take Ibuprofen in moderation/high doses or prolonged use causes GI bleed    Antimicrobials this admission: 12/1 vancomycin >> 12/2 12/1 cefepime >> and flagyl x1 12/1 ED   Microbiology results: 12/1 [facility]:RFVO 12/1 MRSA PCR: neg  Thank you for allowing pharmacy to be a part of this patient's care.  Shean Gerding A 02/11/2022 2:51 PM

## 2022-02-12 DIAGNOSIS — R6520 Severe sepsis without septic shock: Secondary | ICD-10-CM

## 2022-02-12 DIAGNOSIS — A419 Sepsis, unspecified organism: Secondary | ICD-10-CM | POA: Diagnosis not present

## 2022-02-12 LAB — CBC WITH DIFFERENTIAL/PLATELET
Abs Immature Granulocytes: 0.04 10*3/uL (ref 0.00–0.07)
Basophils Absolute: 0 10*3/uL (ref 0.0–0.1)
Basophils Relative: 0 %
Eosinophils Absolute: 0.2 10*3/uL (ref 0.0–0.5)
Eosinophils Relative: 3 %
HCT: 25.9 % — ABNORMAL LOW (ref 39.0–52.0)
Hemoglobin: 7.9 g/dL — ABNORMAL LOW (ref 13.0–17.0)
Immature Granulocytes: 1 %
Lymphocytes Relative: 16 %
Lymphs Abs: 1.2 10*3/uL (ref 0.7–4.0)
MCH: 26.3 pg (ref 26.0–34.0)
MCHC: 30.5 g/dL (ref 30.0–36.0)
MCV: 86.3 fL (ref 80.0–100.0)
Monocytes Absolute: 1.1 10*3/uL — ABNORMAL HIGH (ref 0.1–1.0)
Monocytes Relative: 15 %
Neutro Abs: 4.9 10*3/uL (ref 1.7–7.7)
Neutrophils Relative %: 65 %
Platelets: 205 10*3/uL (ref 150–400)
RBC: 3 MIL/uL — ABNORMAL LOW (ref 4.22–5.81)
RDW: 17.6 % — ABNORMAL HIGH (ref 11.5–15.5)
WBC: 7.4 10*3/uL (ref 4.0–10.5)
nRBC: 0 % (ref 0.0–0.2)

## 2022-02-12 LAB — COMPREHENSIVE METABOLIC PANEL
ALT: 49 U/L — ABNORMAL HIGH (ref 0–44)
AST: 55 U/L — ABNORMAL HIGH (ref 15–41)
Albumin: 2.9 g/dL — ABNORMAL LOW (ref 3.5–5.0)
Alkaline Phosphatase: 62 U/L (ref 38–126)
Anion gap: 5 (ref 5–15)
BUN: 24 mg/dL — ABNORMAL HIGH (ref 8–23)
CO2: 26 mmol/L (ref 22–32)
Calcium: 8.1 mg/dL — ABNORMAL LOW (ref 8.9–10.3)
Chloride: 108 mmol/L (ref 98–111)
Creatinine, Ser: 0.79 mg/dL (ref 0.61–1.24)
GFR, Estimated: >60 mL/min (ref >60)
Glucose, Bld: 111 mg/dL — ABNORMAL HIGH (ref 70–99)
Potassium: 4.1 mmol/L (ref 3.5–5.1)
Sodium: 139 mmol/L (ref 135–145)
Total Bilirubin: 1 mg/dL (ref 0.3–1.2)
Total Protein: 5.6 g/dL — ABNORMAL LOW (ref 6.5–8.1)

## 2022-02-12 LAB — PHOSPHORUS: Phosphorus: 2.7 mg/dL (ref 2.5–4.6)

## 2022-02-12 LAB — PROCALCITONIN: Procalcitonin: <0.1 ng/mL

## 2022-02-12 LAB — MAGNESIUM: Magnesium: 1.8 mg/dL (ref 1.7–2.4)

## 2022-02-12 MED ORDER — SODIUM CHLORIDE 0.9 % IV SOLN
2.0000 g | Freq: Three times a day (TID) | INTRAVENOUS | Status: DC
  Administered 2022-02-12 – 2022-02-14 (×7): 2 g via INTRAVENOUS
  Filled 2022-02-12 (×3): qty 12.5
  Filled 2022-02-12: qty 2
  Filled 2022-02-12 (×2): qty 12.5
  Filled 2022-02-12: qty 2
  Filled 2022-02-12 (×3): qty 12.5

## 2022-02-12 MED ORDER — ACETAMINOPHEN 500 MG PO TABS
ORAL_TABLET | ORAL | Status: AC
  Filled 2022-02-12: qty 2

## 2022-02-12 MED ORDER — MAGNESIUM SULFATE 2 GM/50ML IV SOLN
2.0000 g | Freq: Once | INTRAVENOUS | Status: AC
  Administered 2022-02-12: 2 g via INTRAVENOUS
  Filled 2022-02-12: qty 50

## 2022-02-12 MED ORDER — ENOXAPARIN SODIUM 80 MG/0.8ML IJ SOSY
0.5000 mg/kg | PREFILLED_SYRINGE | INTRAMUSCULAR | Status: DC
  Administered 2022-02-12 – 2022-02-13 (×2): 45 mg via SUBCUTANEOUS
  Filled 2022-02-12: qty 0.8
  Filled 2022-02-12: qty 0.45

## 2022-02-12 MED ORDER — ACETAMINOPHEN 500 MG PO TABS
1000.0000 mg | ORAL_TABLET | Freq: Once | ORAL | Status: AC
  Administered 2022-02-12: 1000 mg via ORAL

## 2022-02-12 NOTE — Progress Notes (Signed)
Pharmacy Electrolyte Monitoring Consult:  Pharmacy consulted to assist in monitoring and replacing electrolytes in this 81 y.o. male admitted on 02/10/2022 with Respiratory Distress Hx: ETOH, IDA, Afib, chronic diarrhea, etc  Labs:  Sodium (mmol/L)  Date Value  02/12/2022 139   Potassium (mmol/L)  Date Value  02/12/2022 4.1   Magnesium (mg/dL)  Date Value  02/12/2022 1.8   Phosphorus (mg/dL)  Date Value  02/12/2022 2.7   Calcium (mg/dL)  Date Value  02/12/2022 8.1 (L)   Albumin (g/dL)  Date Value  02/12/2022 2.9 (L)     Assessment/Plan: Mag 1.8 Will order Magnesium 2 gm IV x1 dose F/u electrolytes with am labs  Chinita Greenland PharmD Clinical Pharmacist 02/12/2022

## 2022-02-12 NOTE — Progress Notes (Signed)
Pt states he does not wear CPAP at home and feels fine without a mask on his face. CPAP will be made available if pt request or needs.

## 2022-02-12 NOTE — Progress Notes (Signed)
[facility] Cardiology    SUBJECTIVE: Patient states he feels reasonably well still has some left shoulder pain no fever chills or sweats in general feels much improved   Vitals:   02/12/22 0700 02/12/22 0725 02/12/22 0800 02/12/22 0900  BP: 111/82  124/69 103/84  Pulse: 89  78 100  Resp: '16  15 15  '$ Temp:   98.9 F (37.2 C)   TempSrc:   Axillary   SpO2: 100% 98% 98% 99%  Weight:      Height:         Intake/Output Summary (Last 24 hours) at 02/12/2022 1153 Last data filed at 02/12/2022 1136 Gross per 24 hour  Intake 2997.13 ml  Output 3350 ml  Net -352.87 ml      PHYSICAL EXAM  General: Well developed, well nourished, in no acute distress HEENT:  Normocephalic and atramatic Neck:  No JVD.  Lungs: Clear bilaterally to auscultation and percussion. Heart: HRRR . Normal S1 and S2 without gallops or murmurs.  Abdomen: Bowel sounds are positive, abdomen soft and non-tender  Msk:  Back normal, normal gait. Normal strength and tone for age. Extremities: No clubbing, cyanosis or edema.  Left shoulder surgery with bandaged wound Neuro: Alert and oriented X 3. Psych:  Good affect, responds appropriately   LABS: Basic Metabolic Panel: Recent Labs    02/11/22 0450 02/11/22 0725 02/12/22 0341  NA 140  --  139  K 4.6  --  4.1  CL 109  --  108  CO2 25  --  26  GLUCOSE 107*  --  111*  BUN 25*  --  24*  CREATININE 0.78  --  0.79  CALCIUM 8.4*  --  8.1*  MG  --  1.9 1.8  PHOS  --  3.4 2.7   Liver Function Tests: Recent Labs    02/11/22 0450 02/12/22 0341  AST 48* 55*  ALT 30 49*  ALKPHOS 64 62  BILITOT 0.8 1.0  PROT 5.9* 5.6*  ALBUMIN 3.3* 2.9*   No results for input(s): "LIPASE", "AMYLASE" in the last 72 hours. CBC: Recent Labs    02/11/22 0450 02/12/22 0341  WBC 10.7* 7.4  NEUTROABS 8.2* 4.9  HGB 8.6* 7.9*  HCT 29.0* 25.9*  MCV 88.4 86.3  PLT 274 205   Cardiac Enzymes: No results for input(s): "CKTOTAL", "CKMB", "CKMBINDEX", "TROPONINI" in the last 72  hours. BNP: Invalid input(s): "POCBNP" D-Dimer: No results for input(s): "DDIMER" in the last 72 hours. Hemoglobin A1C: No results for input(s): "HGBA1C" in the last 72 hours. Fasting Lipid Panel: No results for input(s): "CHOL", "HDL", "LDLCALC", "TRIG", "CHOLHDL", "LDLDIRECT" in the last 72 hours. Thyroid Function Tests: Recent Labs    02/11/22 0450  TSH 3.338   Anemia Panel: Recent Labs    02/11/22 0450  VITAMINB12 333  FOLATE 7.0  FERRITIN 19*  TIBC 456*  IRON 23*  RETICCTPCT 2.0    DG Chest Port 1 View  Result Date: 02/11/2022 CLINICAL DATA:  Shortness of breath.  Sepsis. EXAM: PORTABLE CHEST 1 VIEW COMPARISON:  02/10/2022 FINDINGS: Low lung volumes are again noted. Small left pleural effusion is increased in size. Left lower lobe atelectasis or infiltrate also shows mild increase. Right lung remains clear. Left shoulder prosthesis and overlying skin staples again noted. IMPRESSION: Increased small left pleural effusion and left lower lobe atelectasis or infiltrate. Electronically Signed   By: Marlaine Hind M.D.   On: 02/11/2022 13:08   ECHOCARDIOGRAM COMPLETE  Result Date: 02/11/2022  ECHOCARDIOGRAM REPORT   Patient Name:   Marcus Coleman Date of Exam: 02/11/2022 Medical Rec #:  [id number]      Height:       66.0 in Accession #:    [id number]     Weight:       215.2 lb Date of Birth:  1940-10-27       BSA:          2.063 m Patient Age:    81 years       BP:           104/91 mmHg Patient Gender: M              HR:           91 bpm. Exam Location:  [facility] Procedure: 2D Echo Indications:     Syncope R55  History:         Patient has no prior history of Echocardiogram examinations.  Sonographer:     Kathlen Brunswick RDCS Referring Phys:  [place] Diagnosing Phys: Yolonda Kida MD  Sonographer Comments: Technically challenging study due to limited acoustic windows, suboptimal apical window and no subcostal window. Very challenging study due to patient's recent  left shoulder surgery, unable to remove sling to acquire better images. IMPRESSIONS  1. Left ventricular ejection fraction, by estimation, is 50 to 55%. The left ventricle has low normal function. The left ventricle demonstrates global hypokinesis. The left ventricular internal cavity size was moderately dilated. Left ventricular diastolic function could not be evaluated. There is the interventricular septum is flattened in diastole ('D' shaped left ventricle), consistent with right ventricular volume overload.  2. Right ventricular systolic function is moderately reduced. The right ventricular size is moderately enlarged. Mildly increased right ventricular wall thickness.  3. Left atrial size was mildly dilated.  4. Right atrial size was moderately dilated.  5. The mitral valve is myxomatous. Mild to moderate mitral valve regurgitation.  6. Tricuspid valve regurgitation is moderate.  7. The aortic valve is normal in structure. Aortic valve regurgitation is not visualized. FINDINGS  Left Ventricle: Left ventricular ejection fraction, by estimation, is 50 to 55%. The left ventricle has low normal function. The left ventricle demonstrates global hypokinesis. The left ventricular internal cavity size was moderately dilated. There is no left ventricular hypertrophy. The interventricular septum is flattened in diastole ('D' shaped left ventricle), consistent with right ventricular volume overload. Left ventricular diastolic function could not be evaluated. Right Ventricle: The right ventricular size is moderately enlarged. Mildly increased right ventricular wall thickness. Right ventricular systolic function is moderately reduced. Left Atrium: Left atrial size was mildly dilated. Right Atrium: Right atrial size was moderately dilated. Pericardium: There is no evidence of pericardial effusion. Mitral Valve: The mitral valve is myxomatous. Mild to moderate mitral valve regurgitation. Tricuspid Valve: The tricuspid valve is  grossly normal. Tricuspid valve regurgitation is moderate. Aortic Valve: The aortic valve is normal in structure. Aortic valve regurgitation is not visualized. Pulmonic Valve: The pulmonic valve was grossly normal. Pulmonic valve regurgitation is trivial. Aorta: The ascending aorta was not well visualized. IAS/Shunts: No atrial level shunt detected by color flow Doppler.  LEFT VENTRICLE PLAX 2D LVIDd:         5.20 cm   Diastology LVIDs:         3.70 cm   LV e' medial:    12.20 cm/s LV PW:         1.00 cm   LV E/e' medial:  8.3 LV IVS:        1.20 cm   LV e' lateral:   12.60 cm/s LVOT diam:     1.80 cm   LV E/e' lateral: 8.0 LV SV:         35 LV SV Index:   17 LVOT Area:     2.54 cm  RIGHT VENTRICLE RV Basal diam:  5.10 cm TAPSE (M-mode): 1.6 cm LEFT ATRIUM         Index       RIGHT ATRIUM           Index LA diam:    4.60 cm 2.23 cm/m  RA Area:     36.10 cm                                 RA Volume:   154.00 ml 74.65 ml/m  AORTIC VALVE LVOT Vmax:   80.60 cm/s LVOT Vmean:  48.300 cm/s LVOT VTI:    0.136 m  AORTA Ao Root diam: 3.00 cm MITRAL VALVE                TRICUSPID VALVE MV Area (PHT): 3.79 cm     TR Peak grad:   53.9 mmHg MV Decel Time: 200 msec     TR Vmax:        367.00 cm/s MV E velocity: 101.00 cm/s                             SHUNTS                             Systemic VTI:  0.14 m                             Systemic Diam: 1.80 cm Yolonda Kida MD Electronically signed by Yolonda Kida MD Signature Date/Time: 02/11/2022/1:04:32 PM    Final    CT Angio Chest PE W and/or Wo Contrast  Result Date: 02/10/2022 CLINICAL DATA:  Hypoxemia. EXAM: CT ANGIOGRAPHY CHEST WITH CONTRAST TECHNIQUE: Multidetector CT imaging of the chest was performed using the standard protocol during bolus administration of intravenous contrast. Multiplanar CT image reconstructions and MIPs were obtained to evaluate the vascular anatomy. RADIATION DOSE REDUCTION: This exam was performed according to the departmental  dose-optimization program which includes automated exposure control, adjustment of the mA and/or kV according to patient size and/or use of iterative reconstruction technique. CONTRAST:  59m OMNIPAQUE IOHEXOL 350 MG/ML SOLN COMPARISON:  None Available. FINDINGS: Cardiovascular: Heart is mildly enlarged. Aorta is normal in size. There are atherosclerotic calcifications of the aorta and coronary arteries. There is no pericardial effusion. There is adequate opacification of the pulmonary arteries. No pulmonary embolism identified. Mediastinum/Nodes: There is a large hiatal hernia containing the proximal stomach. No enlarged lymph nodes. Visualized thyroid gland within normal limits. Lungs/Pleura: Trace bilateral pleural effusions. There is compressive atelectasis in the bilateral lower lobes. There is some additional patchy airspace disease in the left lower lobe. There is no pneumothorax. Upper Abdomen: There is trace free fluid in the left upper quadrant. Musculoskeletal: Changes of recent left shoulder arthroplasty present with surrounding soft tissue swelling and air. Review of the MIP images confirms the above findings. IMPRESSION: 1. No evidence for pulmonary embolism. 2. Trace  bilateral pleural effusions. 3. Patchy airspace disease in the left lower lobe worrisome for pneumonia. 4. Cardiomegaly. 5. Large hiatal hernia. 6. Small amount of free fluid in the left upper quadrant. 7. Recent left shoulder arthroplasty. Aortic Atherosclerosis (ICD10-I70.0). Electronically Signed   By: Ronney Asters M.D.   On: 02/10/2022 23:44   DG Chest Port 1 View  Result Date: 02/10/2022 CLINICAL DATA:  Sepsis.  Hypotension and bradycardia. EXAM: PORTABLE CHEST 1 VIEW COMPARISON:  Radiographs 04/15/2007.  Abdominopelvic CT 12/30/2021. FINDINGS: 1659 hours. Lower lung volumes and lordotic positioning. Interval mildly increased elevation of the left hemidiaphragm with mild left basilar atelectasis and a possible small left pleural  effusion. The right lung appears clear. The heart is enlarged. There is a moderate size hiatal hernia. No evidence of pneumothorax. Postsurgical changes from recent left shoulder reverse arthroplasty with skin staples and a small amount of soft tissue emphysema. No acute osseous findings are evident. Telemetry leads overlie the chest. IMPRESSION: 1. Interval mildly increased elevation of the left hemidiaphragm with mild left basilar atelectasis and possible small left pleural effusion. 2. Cardiomegaly and hiatal hernia. Electronically Signed   By: Richardean Sale M.D.   On: 02/10/2022 17:15     Echo preserved left ventricular function  TELEMETRY: Sinus rhythm rate of around 90:  ASSESSMENT AND PLAN:  Principal Problem:   Severe sepsis (HCC) Active Problems:   Decreased responsiveness   Elevated lactic acid level    Plan Postop left shoulder surgery recovering well still has significant pain Pneumonia continue broad-spectrum antibiotic therapy to treat pneumonia symptoms Continue respiratory support supplemental oxygen as necessary Obesity mild recommend modest weight loss exercise portion control Sepsis continue broad-spectrum antibiotic therapy Consider sleep study for possible obstructive sleep apnea   Yolonda Kida, MD 02/12/2022 11:53 AM

## 2022-02-12 NOTE — Consult Note (Signed)
Pharmacy Antibiotic Note  Marcus Coleman is a 81 y.o. male admitted on 02/10/2022 with sepsis.  Pharmacy has been consulted for cefepime dosing.  - MRSA PCR negative- Vancomycin discontinued 02/11/22  Plan: Cefepime 2g IV every 8 hours  Continue to monitor and dose adjust antibiotics according to renal function and indication  Height: '5\' 6"'$  (167.6 cm) Weight: 90.9 kg (200 lb 6.4 oz) IBW/kg (Calculated) : 63.8  Temp (24hrs), Avg:99 F (37.2 C), Min:98.7 F (37.1 C), Max:99.9 F (37.7 C)  Recent Labs  Lab 02/07/22 1220 02/10/22 1543 02/10/22 1958 02/11/22 0134 02/11/22 0450 02/11/22 1343 02/12/22 0341  WBC  --  13.6*  --   --  10.7*  --  7.4  CREATININE 0.71 0.92  --   --  0.78  --  0.79  LATICACIDVEN  --  4.7* 3.5* 1.8 1.5 2.1*  --      Estimated Creatinine Clearance: 79 mL/min (by C-G formula based on SCr of 0.79 mg/dL).    Allergies  Allergen Reactions   Nsaids     Can take Ibuprofen in moderation/high doses or prolonged use causes GI bleed    Antimicrobials this admission: 12/1 vancomycin >> 12/2 12/1 cefepime >> and flagyl x1 12/1 ED   Microbiology results: 12/1 [facility]:GBTD 12/1 MRSA PCR: neg  Thank you for allowing pharmacy to be a part of this patient's care.  Antonya Leeder A 02/12/2022 8:17 AM

## 2022-02-12 NOTE — Hospital Course (Addendum)
The patient is a 81 year old male with history of left shoulder surgery done 1 day prior to ER arrival. Patient came to ER on December 1. 911 was called for apparent cardiac arrest.  12/01 EMS found patient to have a pulse but was unresponsive and hypoxic. Patient does not remember the events of transport however remembers waking up in the ER. In the ER patient was found to have blood pressure 102/50 pulse 47, he was found to have left lower lobe pneumonia. Mental status improved in the ER. Patient was hypotensive in the ER, lactic acidosis was present 4.7. Upon arrival to the ICU patient received additional fluids and did not require vasopressors. Bradycardia also improved patient does take Lopressor and Cardizem at baseline and was also taking opiates.  12/02: remain in ICU but improving. Still bradycardic w/ brief episode unresponsiveness resolved spontaneously, suspect syncope d/t postural hypotension. Cardiology saw patient -  12/03: TRH pickup. Needing mask overnight to sleep well, may benefit from CPAP 12/04: rapid Afib this morning but responded well to IV cardizem injection - he'd not yet had AM meds and his rate control meds had been initially held d/t bradycardia as able- cardizem po resumed at lower dose. PT/OT recs for [facility].  12/05: HR improved today, he is on room air. Hgb 7.7, stable. Iron infusion ordered. *** discharge   Consultants:  none  Procedures: none   Vanc 12/01-12/02 Cefepime since 02/10/22 for CAP coverage    ASSESSMENT & PLAN:    Severe sepsis Pneumonia Cardiomegaly History of atrial fibrillation however had GI bleed on Eliquis. Vasovagal syncope Hiatal hernia Acute hypoxic respiratory failure. Symptomatic bradycardia.  Likely secondary to using opiates Lopressor and Cardizem and being postop. Patient had hematuria with intermittent catheterization  Sepsis due suspected CAP Acute Hypoxic Respiratory Failure secondary to LLL CAP & Mild Pulmonary  Edema Supplemental O2 to maintain SpO2 > 90% Continue CAP coverage: cefepime. MRSA negative, will d/c vancomycin budesonide inhaler/nebs BID, bronchodilators PRN consider NPPV BIPAP PRN   Symptomatic Bradycardia Presenting with HR in the low 40's, hypotension and ?syncopal episode PMH: Persistent Atrial fibrillation not anticoagulated Hx GI Bleed while on Eliquis, HTN EKG shows significant prolonged QTC>750 CTA negative for PE Check TSH, Free T4 --> WNL Monitor BMP+Mag Hold AV nodal drugs and QTC prolonging medication - holding home Metoprolol and Diltiazem for now Obtain 2D Echo --> EF 50-55, global LV hypokinesis, possible RV volume overload Cardiology recs no indication for pacemaker at this time    Rotator cuff arthropathy, left. Nontraumatic complete tear of left rotator cuff  S/p left reverse total shoulder arthroplasty with Dr. Roland Rack on 02/09/2022.  Continue splint Ortho consult as needed    Chronic IDA Hgb 8.8 on admission Check Iron panel --> confirms iron deficiency  He last received Feraheme on November 07, 2021.  Follows with oncology Monitor CBC    DVT prophylaxis: lovenox Pertinent IV fluids/nutrition: no continuous IV fluids  Central lines / invasive devices: none  Code Status: FULL CODE   Disposition: inpatient TOC needs: needs HH  Barriers to discharge / significant pending items: if remains stable overnight, hopefully can d/c tomorrow

## 2022-02-12 NOTE — Progress Notes (Signed)
PROGRESS NOTE    Marcus Coleman   HWE:[id number] DOB: January 26, 1941  DOA: 02/10/2022 Date of Service: 02/12/22 PCP: Idelle Crouch, MD     Brief Narrative / Hospital Course:  The patient is a 81 year old male with history of left shoulder surgery done 1 day prior to ER arrival. Patient came to ER on December 1. 911 was called for apparent cardiac arrest.  12/01 EMS found patient to have a pulse but was unresponsive and hypoxic. Patient does not remember the events of transport however remembers waking up in the ER. In the ER patient was found to have blood pressure 102/50 pulse 47, he was found to have left lower lobe pneumonia. Mental status improved in the ER. Patient was hypotensive in the ER, lactic acidosis was present 4.7. Upon arrival to the ICU patient received additional fluids and did not require vasopressors. Bradycardia also improved patient does take Lopressor and Cardizem at baseline and was also taking opiates.  12/02: remain in ICU but improving. Still bradycardic w/ brief episode unresponsiveness resolved spontaneously, suspect syncope d/t postural hypotension. Cardiology saw patient -  12/03: TRH pickup. Needing mask overnight to sleep well, may benefit from CPAP  Consultants:  none  Procedures: none   Vanc 12/01-12/02 Cefepime since 02/10/22 for CAP coverage    ASSESSMENT & PLAN:    Severe sepsis Pneumonia Cardiomegaly History of atrial fibrillation however had GI bleed on Eliquis. Vasovagal syncope Hiatal hernia Acute hypoxic respiratory failure. Symptomatic bradycardia.  Likely secondary to using opiates Lopressor and Cardizem and being postop. Patient had hematuria with intermittent catheterization  Sepsis due suspected CAP Acute Hypoxic Respiratory Failure secondary to LLL CAP & Mild Pulmonary Edema Supplemental O2 to maintain SpO2 > 90% Continue CAP coverage: cefepime. MRSA negative, will d/c vancomycin budesonide inhaler/nebs BID, bronchodilators  PRN consider NPPV BIPAP PRN   Symptomatic Bradycardia Presenting with HR in the low 40's, hypotension and ?syncopal episode PMH: Persistent Atrial fibrillation not anticoagulated Hx GI Bleed while on Eliquis, HTN EKG shows significant prolonged QTC>750 CTA negative for PE Check TSH, Free T4 --> WNL Monitor BMP+Mag Hold AV nodal drugs and QTC prolonging medication - holding home Metoprolol and Diltiazem for now Obtain 2D Echo --> EF 50-55, global LV hypokinesis, possible RV volume overload Cardiology recs no indication for pacemaker at this time    Rotator cuff arthropathy, left. Nontraumatic complete tear of left rotator cuff  S/p left reverse total shoulder arthroplasty with Dr. Roland Rack on 02/09/2022.  Continue splint Ortho consult as needed    Chronic IDA Hgb 8.8 on admission Check Iron panel --> confirms iron deficiency  He last received Feraheme on November 07, 2021.  Follows with oncology Monitor CBC    DVT prophylaxis: lovenox Pertinent IV fluids/nutrition: no continuous IV fluids  Central lines / invasive devices: none  Code Status: FULL CODE   Disposition: inpatient - transfer from SDU to M/S tele  TOC needs: none at this time, PT/OT to see  Barriers to discharge / significant pending items: if remains stable overnight, hopefully can d/c tomorrow              Subjective:  Patient reports doing well today.  Denies CP/SOB.  Pain controlled.  Denies new weakness.  Tolerating diet.  Reports no concerns w/ urination/defecation.   Family Communication: none at this time     Objective Findings:  Vitals:   02/12/22 1000 02/12/22 1100 02/12/22 1200 02/12/22 1300  BP: (!) 152/136 112/68 (!) 100/55 105/85  Pulse: 87  89 83 (!) 101  Resp: (!) '24 20 16 18  '$ Temp:   98.7 F (37.1 C)   TempSrc:   Axillary   SpO2: 99% 99% 99% 95%  Weight:      Height:        Intake/Output Summary (Last 24 hours) at 02/12/2022 1335 Last data filed at 02/12/2022 1300 Gross  per 24 hour  Intake 2528.61 ml  Output 3550 ml  Net -1021.39 ml   Filed Weights   02/10/22 2000 02/11/22 0500 02/12/22 0500  Weight: 97.6 kg 97.6 kg 90.9 kg    Examination:  Constitutional:  VS as above General Appearance: alert, well-developed, well-nourished, NAD Respiratory: Normal respiratory effort No wheeze No rhonchi No rales Cardiovascular: S1/S2 normal No murmur No rub/gallop auscultated Gastrointestinal: No tenderness Musculoskeletal:  No clubbing/cyanosis of digits L arm in sling  Neurological: No cranial nerve deficit on limited exam Alert Psychiatric: Normal judgment/insight Normal mood and affect       Scheduled Medications:   acetaminophen       budesonide (PULMICORT) nebulizer solution  0.25 mg Nebulization BID   Chlorhexidine Gluconate Cloth  6 each Topical Daily   enoxaparin (LOVENOX) injection  0.5 mg/kg Subcutaneous Q24H   fluticasone  1 spray Each Nare Daily   ipratropium-albuterol  3 mL Nebulization Q6H   pantoprazole  40 mg Oral Daily   sodium chloride flush  3 mL Intravenous Q12H   tamsulosin  0.4 mg Oral Daily    Continuous Infusions:  sodium chloride Stopped (02/10/22 1718)   sodium chloride 10 mL/hr at 02/12/22 1200   ceFEPime (MAXIPIME) IV 2 g (02/12/22 1318)   norepinephrine (LEVOPHED) Adult infusion Stopped (02/10/22 2011)   sodium chloride      PRN Medications:  sodium chloride, acetaminophen, atropine, docusate sodium, ipratropium-albuterol, ondansetron (ZOFRAN) IV, mouth rinse, polyethylene glycol  Antimicrobials:  Anti-infectives (From admission, onward)    Start     Dose/Rate Route Frequency Ordered Stop   02/12/22 1400  ceFEPIme (MAXIPIME) 2 g in sodium chloride 0.9 % 100 mL IVPB        2 g 200 mL/hr over 30 Minutes Intravenous Every 8 hours 02/12/22 0859     02/11/22 1700  vancomycin (VANCOREADY) IVPB 1750 mg/350 mL  Status:  Discontinued        1,750 mg 175 mL/hr over 120 Minutes Intravenous Every 24 hours  02/10/22 1830 02/11/22 1451   02/11/22 0430  ceFEPIme (MAXIPIME) 1 g in sodium chloride 0.9 % 100 mL IVPB  Status:  Discontinued        1 g 200 mL/hr over 30 Minutes Intravenous Every 12 hours 02/10/22 1817 02/10/22 1822   02/10/22 2200  ceFEPIme (MAXIPIME) 1 g in sodium chloride 0.9 % 100 mL IVPB  Status:  Discontinued        1 g 200 mL/hr over 30 Minutes Intravenous Every 8 hours 02/10/22 1822 02/12/22 0859   02/10/22 1630  vancomycin (VANCOREADY) IVPB 2000 mg/400 mL        2,000 mg 200 mL/hr over 120 Minutes Intravenous  Once 02/10/22 1622 02/10/22 1908   02/10/22 1615  ceFEPIme (MAXIPIME) 2 g in sodium chloride 0.9 % 100 mL IVPB        2 g 200 mL/hr over 30 Minutes Intravenous  Once 02/10/22 1610 02/10/22 1716   02/10/22 1615  metroNIDAZOLE (FLAGYL) IVPB 500 mg        500 mg 100 mL/hr over 60 Minutes Intravenous  Once 02/10/22 1610 02/10/22 1742  02/10/22 1615  vancomycin (VANCOCIN) IVPB 1000 mg/200 mL premix  Status:  Discontinued        1,000 mg 200 mL/hr over 60 Minutes Intravenous  Once 02/10/22 1610 02/10/22 1622           Data Reviewed: I have personally reviewed following labs and imaging studies  CBC: Recent Labs  Lab 02/10/22 1543 02/11/22 0450 02/12/22 0341  WBC 13.6* 10.7* 7.4  NEUTROABS  --  8.2* 4.9  HGB 8.8* 8.6* 7.9*  HCT 30.2* 29.0* 25.9*  MCV 89.9 88.4 86.3  PLT 295 274 161   Basic Metabolic Panel: Recent Labs  Lab 02/07/22 1220 02/10/22 1543 02/11/22 0450 02/11/22 0725 02/12/22 0341  NA 141 140 140  --  139  K 4.2 5.1 4.6  --  4.1  CL 106 106 109  --  108  CO2 '30 24 25  '$ --  26  GLUCOSE 114* 154* 107*  --  111*  BUN 25* 21 25*  --  24*  CREATININE 0.71 0.92 0.78  --  0.79  CALCIUM 8.9 8.8* 8.4*  --  8.1*  MG  --   --   --  1.9 1.8  PHOS  --   --   --  3.4 2.7   GFR: Estimated Creatinine Clearance: 79 mL/min (by C-G formula based on SCr of 0.79 mg/dL). Liver Function Tests: Recent Labs  Lab 02/10/22 1543 02/11/22 0450  02/12/22 0341  AST 31 48* 55*  ALT 10 30 49*  ALKPHOS 71 64 62  BILITOT 1.1 0.8 1.0  PROT 6.4* 5.9* 5.6*  ALBUMIN 3.3* 3.3* 2.9*   No results for input(s): "LIPASE", "AMYLASE" in the last 168 hours. No results for input(s): "AMMONIA" in the last 168 hours. Coagulation Profile: Recent Labs  Lab 02/10/22 1543  INR 1.3*   Cardiac Enzymes: No results for input(s): "CKTOTAL", "CKMB", "CKMBINDEX", "TROPONINI" in the last 168 hours. BNP (last 3 results) No results for input(s): "PROBNP" in the last 8760 hours. HbA1C: No results for input(s): "HGBA1C" in the last 72 hours. CBG: Recent Labs  Lab 02/10/22 1538 02/10/22 1907  GLUCAP 143* 118*   Lipid Profile: No results for input(s): "CHOL", "HDL", "LDLCALC", "TRIG", "CHOLHDL", "LDLDIRECT" in the last 72 hours. Thyroid Function Tests: Recent Labs    02/11/22 0450  TSH 3.338  FREET4 0.70   Anemia Panel: Recent Labs    02/11/22 0450  VITAMINB12 333  FOLATE 7.0  FERRITIN 19*  TIBC 456*  IRON 23*  RETICCTPCT 2.0   Most Recent Urinalysis On File:     Component Value Date/Time   COLORURINE AMBER (A) 02/10/2022 1637   APPEARANCEUR CLOUDY (A) 02/10/2022 1637   APPEARANCEUR Clear 01/13/2022 0905   LABSPEC 1.023 02/10/2022 1637   PHURINE 5.0 02/10/2022 1637   GLUCOSEU NEGATIVE 02/10/2022 1637   HGBUR SMALL (A) 02/10/2022 1637   BILIRUBINUR NEGATIVE 02/10/2022 1637   BILIRUBINUR Negative 01/13/2022 0905   KETONESUR 5 (A) 02/10/2022 1637   PROTEINUR >=300 (A) 02/10/2022 1637   NITRITE NEGATIVE 02/10/2022 1637   LEUKOCYTESUR NEGATIVE 02/10/2022 1637   Sepsis Labs: '@LABRCNTIP'$ (procalcitonin:4,lacticidven:4)  Recent Results (from the past 240 hour(s))  Surgical pcr screen     Status: None   Collection Time: 02/07/22 12:18 PM   Specimen: Nasal Mucosa; Nasal Swab  Result Value Ref Range Status   MRSA, PCR NEGATIVE NEGATIVE Final   Staphylococcus aureus NEGATIVE NEGATIVE Final    Comment: (NOTE) The Xpert SA Assay (FDA  approved for NASAL specimens  in patients 42 years of age and older), is one component of a comprehensive surveillance program. It is not intended to diagnose infection nor to guide or monitor treatment. Performed at [facility], [address] [place], [place] [address]   Blood culture (routine x 2)     Status: None (Preliminary result)   Collection Time: 02/10/22  4:37 PM   Specimen: BLOOD  Result Value Ref Range Status   Specimen Description BLOOD BLOOD RIGHT HAND  Final   Special Requests   Final    BOTTLES DRAWN AEROBIC AND ANAEROBIC Blood Culture results may not be optimal due to an inadequate volume of blood received in culture bottles   Culture   Final    NO GROWTH 2 DAYS Performed at [facility], [address] [place], [place] [address]    Report Status PENDING  Incomplete  SARS Coronavirus 2 by RT PCR (hospital order, performed in [place] hospital lab) *cepheid single result test* Anterior Nasal Swab     Status: None   Collection Time: 02/10/22  4:37 PM   Specimen: Anterior Nasal Swab  Result Value Ref Range Status   SARS Coronavirus 2 by RT PCR NEGATIVE NEGATIVE Final    Comment: (NOTE) SARS-CoV-2 target nucleic acids are NOT DETECTED.  The SARS-CoV-2 RNA is generally detectable in upper and lower respiratory specimens during the acute phase of infection. The lowest concentration of SARS-CoV-2 viral copies this assay can detect is 250 copies / mL. A negative result does not preclude SARS-CoV-2 infection and should not be used as the sole basis for treatment or other patient management decisions.  A negative result may occur with improper specimen collection / handling, submission of specimen other than nasopharyngeal swab, presence of viral mutation(s) within the areas targeted by this assay, and inadequate number of viral copies (<250 copies / mL). A negative result must be combined with clinical observations, patient history, and  epidemiological information.  Fact Sheet for Patients:   https://www.patel.info/  Fact Sheet for Healthcare Providers: https://hall.com/  This test is not yet approved or  cleared by the [place] FDA and has been authorized for detection and/or diagnosis of SARS-CoV-2 by FDA under an Emergency Use Authorization (EUA).  This EUA will remain in effect (meaning this test can be used) for the duration of the COVID-19 declaration under Section 564(b)(1) of the Act, 21 U.S.C. section 360bbb-3(b)(1), unless the authorization is terminated or revoked sooner.  Performed at [facility], [address] [place], [place] [address]   MRSA Next Gen by PCR, Nasal     Status: None   Collection Time: 02/10/22  9:07 PM   Specimen: Nasal Mucosa; Nasal Swab  Result Value Ref Range Status   MRSA by PCR Next Gen NOT DETECTED NOT DETECTED Final    Comment: (NOTE) The GeneXpert MRSA Assay (FDA approved for NASAL specimens only), is one component of a comprehensive MRSA colonization surveillance program. It is not intended to diagnose MRSA infection nor to guide or monitor treatment for MRSA infections. Test performance is not FDA approved in patients less than 34 years old. Performed at [facility], [address] [place], [place] [address]   Blood culture (routine x 2)     Status: None (Preliminary result)   Collection Time: 02/11/22  4:50 AM   Specimen: BLOOD RIGHT HAND  Result Value Ref Range Status   Specimen Description BLOOD RIGHT HAND  Final   Special Requests Blood Culture adequate volume  Final   Culture   Final    NO GROWTH 1 DAY Performed at [facility], [address], [place], [place] [address]    Report Status PENDING  Incomplete         Radiology Studies: DG Chest [facility] 1 View  Result Date: 02/11/2022 CLINICAL DATA:  Shortness of breath.  Sepsis. EXAM: PORTABLE CHEST 1 VIEW COMPARISON:   02/10/2022 FINDINGS: Low lung volumes are again noted. Small left pleural effusion is increased in size. Left lower lobe atelectasis or infiltrate also shows mild increase. Right lung remains clear. Left shoulder prosthesis and overlying skin staples again noted. IMPRESSION: Increased small left pleural effusion and left lower lobe atelectasis or infiltrate. Electronically Signed   By: Marlaine Hind M.D.   On: 02/11/2022 13:08   ECHOCARDIOGRAM COMPLETE  Result Date: 02/11/2022    ECHOCARDIOGRAM REPORT   Patient Name:   Marcus Coleman Date of Exam: 02/11/2022 Medical Rec #:  [id number]      Height:       66.0 in Accession #:    [id number]     Weight:       215.2 lb Date of Birth:  October 02, 1940       BSA:          2.063 m Patient Age:    47 years       BP:           104/91 mmHg Patient Gender: M              HR:           91 bpm. Exam Location:  [facility] Procedure: 2D Echo Indications:     Syncope R55  History:         Patient has no prior history of Echocardiogram examinations.  Sonographer:     Kathlen Brunswick RDCS Referring Phys:  [place] Diagnosing Phys: Yolonda Kida MD  Sonographer Comments: Technically challenging study due to limited acoustic windows, suboptimal apical window and no subcostal window. Very challenging study due to patient's recent left shoulder surgery, unable to remove sling to acquire better images. IMPRESSIONS  1. Left ventricular ejection fraction, by estimation, is 50 to 55%. The left ventricle has low normal function. The left ventricle demonstrates global hypokinesis. The left ventricular internal cavity size was moderately dilated. Left ventricular diastolic function could not be evaluated. There is the interventricular septum is flattened in diastole ('D' shaped left ventricle), consistent with right ventricular volume overload.  2. Right ventricular systolic function is moderately reduced. The right ventricular size is moderately enlarged. Mildly increased right  ventricular wall thickness.  3. Left atrial size was mildly dilated.  4. Right atrial size was moderately dilated.  5. The mitral valve is myxomatous. Mild to moderate mitral valve regurgitation.  6. Tricuspid valve regurgitation is moderate.  7. The aortic valve is normal in structure. Aortic valve regurgitation is not visualized. FINDINGS  Left Ventricle: Left ventricular ejection fraction, by estimation, is 50 to 55%. The left ventricle has low normal function. The left ventricle demonstrates global hypokinesis. The left ventricular internal cavity size was moderately dilated. There is no left ventricular hypertrophy. The interventricular septum is flattened in diastole ('D' shaped left ventricle), consistent with right ventricular volume overload. Left ventricular diastolic function could not be evaluated. Right Ventricle: The right ventricular size is moderately enlarged. Mildly increased right ventricular wall thickness. Right ventricular systolic function is moderately reduced. Left Atrium: Left atrial size was mildly dilated.  Right Atrium: Right atrial size was moderately dilated. Pericardium: There is no evidence of pericardial effusion. Mitral Valve: The mitral valve is myxomatous. Mild to moderate mitral valve regurgitation. Tricuspid Valve: The tricuspid valve is grossly normal. Tricuspid valve regurgitation is moderate. Aortic Valve: The aortic valve is normal in structure. Aortic valve regurgitation is not visualized. Pulmonic Valve: The pulmonic valve was grossly normal. Pulmonic valve regurgitation is trivial. Aorta: The ascending aorta was not well visualized. IAS/Shunts: No atrial level shunt detected by color flow Doppler.  LEFT VENTRICLE PLAX 2D LVIDd:         5.20 cm   Diastology LVIDs:         3.70 cm   LV e' medial:    12.20 cm/s LV PW:         1.00 cm   LV E/e' medial:  8.3 LV IVS:        1.20 cm   LV e' lateral:   12.60 cm/s LVOT diam:     1.80 cm   LV E/e' lateral: 8.0 LV SV:         35 LV  SV Index:   17 LVOT Area:     2.54 cm  RIGHT VENTRICLE RV Basal diam:  5.10 cm TAPSE (M-mode): 1.6 cm LEFT ATRIUM         Index       RIGHT ATRIUM           Index LA diam:    4.60 cm 2.23 cm/m  RA Area:     36.10 cm                                 RA Volume:   154.00 ml 74.65 ml/m  AORTIC VALVE LVOT Vmax:   80.60 cm/s LVOT Vmean:  48.300 cm/s LVOT VTI:    0.136 m  AORTA Ao Root diam: 3.00 cm MITRAL VALVE                TRICUSPID VALVE MV Area (PHT): 3.79 cm     TR Peak grad:   53.9 mmHg MV Decel Time: 200 msec     TR Vmax:        367.00 cm/s MV E velocity: 101.00 cm/s                             SHUNTS                             Systemic VTI:  0.14 m                             Systemic Diam: 1.80 cm Yolonda Kida MD Electronically signed by Yolonda Kida MD Signature Date/Time: 02/11/2022/1:04:32 PM    Final             LOS: 2 days       Emeterio Reeve, DO [facility] 02/12/2022, 1:35 PM    Dictation software may have been used to generate the above note. Typos may occur and escape review in typed/dictated notes. Please contact Dr Sheppard Coil directly for clarity if needed.  Staff may message me via secure chat in [place]  but this may not receive an immediate response,  please page me for urgent matters!  If 7PM-7AM, please  contact night coverage [email or web address]

## 2022-02-13 ENCOUNTER — Encounter: Payer: Self-pay | Admitting: Pulmonary Disease

## 2022-02-13 ENCOUNTER — Other Ambulatory Visit: Payer: Self-pay

## 2022-02-13 DIAGNOSIS — A419 Sepsis, unspecified organism: Secondary | ICD-10-CM | POA: Diagnosis not present

## 2022-02-13 DIAGNOSIS — R6520 Severe sepsis without septic shock: Secondary | ICD-10-CM | POA: Diagnosis not present

## 2022-02-13 LAB — CBC WITH DIFFERENTIAL/PLATELET
Abs Immature Granulocytes: 0.02 10*3/uL (ref 0.00–0.07)
Basophils Absolute: 0 10*3/uL (ref 0.0–0.1)
Basophils Relative: 0 %
Eosinophils Absolute: 0.2 10*3/uL (ref 0.0–0.5)
Eosinophils Relative: 3 %
HCT: 23.7 % — ABNORMAL LOW (ref 39.0–52.0)
Hemoglobin: 7.4 g/dL — ABNORMAL LOW (ref 13.0–17.0)
Immature Granulocytes: 0 %
Lymphocytes Relative: 14 %
Lymphs Abs: 0.7 10*3/uL (ref 0.7–4.0)
MCH: 26.2 pg (ref 26.0–34.0)
MCHC: 31.2 g/dL (ref 30.0–36.0)
MCV: 84 fL (ref 80.0–100.0)
Monocytes Absolute: 1 10*3/uL (ref 0.1–1.0)
Monocytes Relative: 18 %
Neutro Abs: 3.4 10*3/uL (ref 1.7–7.7)
Neutrophils Relative %: 65 %
Platelets: 190 10*3/uL (ref 150–400)
RBC: 2.82 MIL/uL — ABNORMAL LOW (ref 4.22–5.81)
RDW: 17.6 % — ABNORMAL HIGH (ref 11.5–15.5)
WBC: 5.3 10*3/uL (ref 4.0–10.5)
nRBC: 0 % (ref 0.0–0.2)

## 2022-02-13 LAB — COMPREHENSIVE METABOLIC PANEL
ALT: 60 U/L — ABNORMAL HIGH (ref 0–44)
AST: 57 U/L — ABNORMAL HIGH (ref 15–41)
Albumin: 2.7 g/dL — ABNORMAL LOW (ref 3.5–5.0)
Alkaline Phosphatase: 62 U/L (ref 38–126)
Anion gap: 5 (ref 5–15)
BUN: 19 mg/dL (ref 8–23)
CO2: 27 mmol/L (ref 22–32)
Calcium: 8 mg/dL — ABNORMAL LOW (ref 8.9–10.3)
Chloride: 106 mmol/L (ref 98–111)
Creatinine, Ser: 0.6 mg/dL — ABNORMAL LOW (ref 0.61–1.24)
GFR, Estimated: >60 mL/min (ref >60)
Glucose, Bld: 139 mg/dL — ABNORMAL HIGH (ref 70–99)
Potassium: 3.9 mmol/L (ref 3.5–5.1)
Sodium: 138 mmol/L (ref 135–145)
Total Bilirubin: 0.8 mg/dL (ref 0.3–1.2)
Total Protein: 5.5 g/dL — ABNORMAL LOW (ref 6.5–8.1)

## 2022-02-13 LAB — PHOSPHORUS: Phosphorus: 1.8 mg/dL — ABNORMAL LOW (ref 2.5–4.6)

## 2022-02-13 LAB — MAGNESIUM: Magnesium: 2 mg/dL (ref 1.7–2.4)

## 2022-02-13 LAB — SURGICAL PATHOLOGY

## 2022-02-13 MED ORDER — SENNOSIDES-DOCUSATE SODIUM 8.6-50 MG PO TABS
2.0000 | ORAL_TABLET | Freq: Two times a day (BID) | ORAL | Status: AC
  Administered 2022-02-13 (×2): 2 via ORAL
  Filled 2022-02-13 (×2): qty 2

## 2022-02-13 MED ORDER — DILTIAZEM HCL 25 MG/5ML IV SOLN
10.0000 mg | Freq: Once | INTRAVENOUS | Status: AC
  Administered 2022-02-13: 10 mg via INTRAVENOUS
  Filled 2022-02-13: qty 5

## 2022-02-13 MED ORDER — IPRATROPIUM-ALBUTEROL 0.5-2.5 (3) MG/3ML IN SOLN
3.0000 mL | Freq: Two times a day (BID) | RESPIRATORY_TRACT | Status: DC
  Administered 2022-02-13 – 2022-02-14 (×2): 3 mL via RESPIRATORY_TRACT
  Filled 2022-02-13 (×2): qty 3

## 2022-02-13 MED ORDER — FUROSEMIDE 40 MG PO TABS
40.0000 mg | ORAL_TABLET | ORAL | Status: DC
  Administered 2022-02-13 – 2022-02-14 (×2): 40 mg via ORAL
  Filled 2022-02-13 (×2): qty 1

## 2022-02-13 MED ORDER — BISACODYL 10 MG RE SUPP: 10.0000 mg | Freq: Every day | RECTAL | Status: DC | PRN

## 2022-02-13 MED ORDER — LORATADINE 10 MG PO TABS
10.0000 mg | ORAL_TABLET | Freq: Every day | ORAL | Status: DC
  Administered 2022-02-13 – 2022-02-14 (×2): 10 mg via ORAL
  Filled 2022-02-13 (×2): qty 1

## 2022-02-13 MED ORDER — POLYETHYLENE GLYCOL 3350 17 G PO PACK
17.0000 g | PACK | Freq: Two times a day (BID) | ORAL | Status: AC
  Administered 2022-02-13 – 2022-02-14 (×2): 17 g via ORAL
  Filled 2022-02-13 (×2): qty 1

## 2022-02-13 MED ORDER — TIMOLOL MALEATE 0.5 % OP SOLN
1.0000 [drp] | Freq: Every day | OPHTHALMIC | Status: DC
  Filled 2022-02-13: qty 5

## 2022-02-13 MED ORDER — DILTIAZEM HCL ER 300 MG PO TB24: 1.0000 | ORAL_TABLET | ORAL | Status: DC

## 2022-02-13 MED ORDER — DILTIAZEM HCL 30 MG PO TABS
30.0000 mg | ORAL_TABLET | Freq: Two times a day (BID) | ORAL | Status: DC
  Administered 2022-02-13 – 2022-02-14 (×3): 30 mg via ORAL
  Filled 2022-02-13 (×3): qty 1

## 2022-02-13 MED ORDER — TIMOLOL MALEATE 0.5 % OP SOLN: 1.0000 [drp] | Freq: Every day | OPHTHALMIC | Status: DC

## 2022-02-13 MED ORDER — POTASSIUM PHOSPHATES 15 MMOLE/5ML IV SOLN
30.0000 mmol | Freq: Once | INTRAVENOUS | Status: AC
  Administered 2022-02-13: 30 mmol via INTRAVENOUS
  Filled 2022-02-13: qty 10

## 2022-02-13 MED ORDER — TIMOLOL MALEATE 0.5 % OP SOLN
1.0000 [drp] | Freq: Every day | OPHTHALMIC | Status: DC
  Administered 2022-02-14: 1 [drp] via OPHTHALMIC

## 2022-02-13 MED ORDER — SALINE SPRAY 0.65 % NA SOLN: 1.0000 | NASAL | Status: DC | PRN

## 2022-02-13 NOTE — Consult Note (Signed)
Pharmacy Antibiotic Note  Marcus Coleman is a 81 y.o. male admitted on 02/10/2022 with sepsis.  Pharmacy has been consulted for cefepime dosing.  Noted renal function stable and WBC greatly improved.   Plan: Cefepime 2g IV every 8 hours (Day 4 of __)  Continue to monitor and dose adjust antibiotics according to renal function and indication.  Height: '5\' 6"'$  (167.6 cm) Weight: 106.4 kg (234 lb 9.1 oz) IBW/kg (Calculated) : 63.8  Temp (24hrs), Avg:98.7 F (37.1 C), Min:98.6 F (37 C), Max:98.9 F (37.2 C)  Recent Labs  Lab 02/07/22 1220 02/10/22 1543 02/10/22 1958 02/11/22 0134 02/11/22 0450 02/11/22 1343 02/12/22 0341 02/13/22 0346  WBC  --  13.6*  --   --  10.7*  --  7.4 5.3  CREATININE 0.71 0.92  --   --  0.78  --  0.79 0.60*  LATICACIDVEN  --  4.7* 3.5* 1.8 1.5 2.1*  --   --      Estimated Creatinine Clearance: 85.6 mL/min (A) (by C-G formula based on SCr of 0.6 mg/dL (L)).    Allergies  Allergen Reactions   Nsaids     Can take Ibuprofen in moderation/high doses or prolonged use causes GI bleed    Antimicrobials this admission: 12/1 metronidazole '500mg'$  x1 12/1 vancomycin >> 12/2 12/1 cefepime >>  Microbiology results: 12/1 OVP:CHEK 12/1 MRSA PCR: neg  Thank you for allowing pharmacy to be a part of this patient's care.  Meta Kroenke Rodriguez-Guzman PharmD, BCPS 02/13/2022 7:35 AM

## 2022-02-13 NOTE — Evaluation (Signed)
Occupational Therapy Evaluation Patient Details Name: Marcus Coleman MRN: [id number] DOB: 04-Mar-1941 Today's Date: 02/13/2022   History of Present Illness 81 yo male with sepsis s/s to pnemonia. PMH includes afib, RLE DVT not on anticoagulation, HTN, dyslipidemia, IDA receiving Feraheme, and left total shoulder on 02/09/22.   Clinical Impression   Patient seen for OT evaluation, spouse present at the end. Pt presenting with decreased independence in self care, balance, functional mobility/transfers, and endurance. Pt currently functioning at [facility] for bed mobility, Min A to stand from EOB, Min guard for stand pivot transfer to/from [facility] using SPC, Max A for UB dressing, and Mod A for LB dressing. Pt negative for orthostatic hypotension during session. Pt will benefit from acute OT to increase overall independence in the areas of ADLs and functional mobility in order to safely discharge home. Pt could benefit from [facility] following D/C to decrease falls risk, improve balance, and maximize independence in self-care within own home environment.    Recommendations for follow up therapy are one component of a multi-disciplinary discharge planning process, led by the attending physician.  Recommendations may be updated based on patient status, additional functional criteria and insurance authorization.   Follow Up Recommendations  Home health OT     Assistance Recommended at Discharge Intermittent Supervision/Assistance  Patient can return home with the following A little help with walking and/or transfers;A lot of help with bathing/dressing/bathroom;Assistance with cooking/housework;Assist for transportation;Help with stairs or ramp for entrance    Functional Status Assessment  Patient has had a recent decline in their functional status and demonstrates the ability to make significant improvements in function in a reasonable and predictable amount of time.  Equipment Recommendations  Other (comment)  Management consultant)    Recommendations for Other Services       Precautions / Restrictions Precautions Precautions: Fall Precaution Comments: LUE shoulder sling/immobilizer Restrictions Weight Bearing Restrictions: Yes LUE Weight Bearing: Non weight bearing     Mobility Bed Mobility Overal bed mobility: Needs Assistance Bed Mobility: Supine to Sit     Supine to sit: Min assist     General bed mobility comments: for trunk elevation    Transfers Overall transfer level: Needs assistance Equipment used: Straight cane Transfers: Sit to/from Stand, Bed to chair/wheelchair/BSC Sit to Stand: Min assist Stand pivot transfers: Min guard                Balance Overall balance assessment: Needs assistance Sitting-balance support: No upper extremity supported, Feet supported Sitting balance-Leahy Scale: Good     Standing balance support: Single extremity supported Standing balance-Leahy Scale: Fair                             ADL either performed or assessed with clinical judgement   ADL Overall ADL's : Needs assistance/impaired                 Upper Body Dressing : Maximal assistance;Sitting Upper Body Dressing Details (indicate cue type and reason): to don/doff sling and polar care Lower Body Dressing: Moderate assistance;Sit to/from stand   Toilet Transfer: BSC/3in1;Stand-pivot;Min guard;Cueing for Office manager Details (indicate cue type and reason): using SPC Toileting- Clothing Manipulation and Hygiene: Maximal assistance;Sit to/from stand               Vision Patient Visual Report: No change from baseline       Perception     Praxis      Pertinent  Vitals/Pain Pain Assessment Pain Assessment: Faces Faces Pain Scale: Hurts a little bit Pain Location: L shoulder Pain Descriptors / Indicators: Grimacing, Guarding, Sore Pain Intervention(s): Monitored during session, Repositioned     Hand Dominance Right   Extremity/Trunk  Assessment Upper Extremity Assessment Upper Extremity Assessment: Generalized weakness;LUE deficits/detail LUE Deficits / Details: s/p TSA on 11/30 LUE: Unable to fully assess due to immobilization   Lower Extremity Assessment Lower Extremity Assessment: Overall WFL for tasks assessed   Cervical / Trunk Assessment Cervical / Trunk Assessment: Normal   Communication Communication Communication: No difficulties   Cognition Arousal/Alertness: Awake/alert Behavior During Therapy: WFL for tasks assessed/performed Overall Cognitive Status: Within Functional Limits for tasks assessed                                       General Comments  BP in sitting: 141/84 (MAP 99); BP in standing: 130/94 (MAP 106). VC for pursed lip breathing.    Exercises Other Exercises Other Exercises: OT provided education re: role of OT, OT POC, post acute recs, sitting up for all meals, EOB/OOB mobility with assistance, home/fall safety.     Shoulder Instructions      Home Living Family/patient expects to be discharged to:: Private residence Living Arrangements: Spouse/significant other Available Help at Discharge: Family;Available 24 hours/day Type of Home: House Home Access: Stairs to enter [employer] of Steps: 3 Entrance Stairs-Rails: Can reach both Home Layout: One level     Bathroom Shower/Tub: Occupational psychologist: Handicapped height     Home Equipment: [place] - single point;Shower seat - built in;Grab bars - tub/shower          Prior Functioning/Environment Prior Level of Function : Independent/Modified Independent;Driving             Mobility Comments: SPC ADLs Comments: Independent, although requiring assistance with ADLs since recent shoulder replacement (11/30)        OT Problem List: Decreased strength;Decreased range of motion;Decreased activity tolerance;Impaired balance (sitting and/or standing);Decreased safety  awareness;Decreased knowledge of precautions;Cardiopulmonary status limiting activity      OT Treatment/Interventions: Self-care/ADL training;Therapeutic exercise;Therapeutic activities;Energy conservation;DME and/or AE instruction;Patient/family education;Balance training    OT Goals(Current goals can be found in the care plan section) Acute Rehab OT Goals Patient Stated Goal: go home OT Goal Formulation: With patient Time For Goal Achievement: 02/27/22 Potential to Achieve Goals: Good   OT Frequency: Min 2X/week    Co-evaluation              AM-PAC OT "6 Clicks" Daily Activity     Outcome Measure Help from another person eating meals?: None Help from another person taking care of personal grooming?: A Little Help from another person toileting, which includes using toliet, bedpan, or urinal?: A Lot Help from another person bathing (including washing, rinsing, drying)?: A Lot Help from another person to put on and taking off regular upper body clothing?: A Lot Help from another person to put on and taking off regular lower body clothing?: A Little 6 Click Score: 16   End of Session Equipment Utilized During Treatment: Oxygen;Other (comment) [facility]) Nurse Communication: Mobility status  Activity Tolerance: Patient tolerated treatment well Patient left: in bed;with call bell/phone within reach;with family/visitor present;Other (comment) (left sitting EOB with PT, partial overlap)  OT Visit Diagnosis: Unsteadiness on feet (R26.81);Muscle weakness (generalized) (M62.81);Pain Pain - Right/Left: Left Pain - part of body:  Shoulder                Time: 1010-1052 OT Time Calculation (min): 42 min Charges:  OT General Charges $OT Visit: 1 Visit OT Evaluation $OT Eval Low Complexity: 1 Low OT Treatments $Self Care/Home Management : 8-22 mins  [facility] MS, OTR/L ascom [phone]  02/13/22, 2:11 PM

## 2022-02-13 NOTE — Evaluation (Signed)
Physical Therapy Evaluation Patient Details Name: Marcus Coleman MRN: [id number] DOB: 30-Jan-1941 Today's Date: 02/13/2022  History of Present Illness  81 yo male with sepsis s/s to pnemonia. PMH includes afib, RLE DVT not on anticoagulation, HTN, dyslipidemia, IDA receiving Feraheme, and left total shoulder  Clinical Impression  Pt found with OT on EOB upon PT entry. Sit<>Stand with min assist x1. Pt able to maintain static standing balance with no LOB during blood pressure measurement. Stand pivot transfer to chair with CGA and no upper extremity support. Pt negative for orthostatic hypotension. HR increased to 140bpm and returned to WNL when pt cued to rest in chair. Pt would benefit from skilled physical therapy to address the listed deficits (see below) to increase independence with ADLs and function. Current recommendation is HHPT with intermittent assist.          Recommendations for follow up therapy are one component of a multi-disciplinary discharge planning process, led by the attending physician.  Recommendations may be updated based on patient status, additional functional criteria and insurance authorization.  Follow Up Recommendations Home health PT      Assistance Recommended at Discharge Intermittent Supervision/Assistance  Patient can return home with the following  A little help with walking and/or transfers;A little help with bathing/dressing/bathroom;Assistance with cooking/housework;Help with stairs or ramp for entrance;Assist for transportation    Equipment Recommendations None recommended by PT  Recommendations for Other Services       Functional Status Assessment Patient has had a recent decline in their functional status and demonstrates the ability to make significant improvements in function in a reasonable and predictable amount of time.     Precautions / Restrictions Precautions Precautions: Fall Restrictions Weight Bearing Restrictions: Yes RUE Weight  Bearing: Weight bearing as tolerated LUE Weight Bearing: Non weight bearing RLE Weight Bearing: Weight bearing as tolerated LLE Weight Bearing: Weight bearing as tolerated      Mobility  Bed Mobility Overal bed mobility: Needs Assistance Bed Mobility: Supine to Sit     Supine to sit: Supervision          Transfers Overall transfer level: Needs assistance Equipment used: Straight cane Transfers: Sit to/from Stand, Bed to chair/wheelchair/BSC Sit to Stand: Min assist Stand pivot transfers: Min guard              Ambulation/Gait                  Stairs            Wheelchair Mobility    Modified Rankin (Stroke Patients Only)       Balance Overall balance assessment: Needs assistance Sitting-balance support: No upper extremity supported, Feet supported Sitting balance-Leahy Scale: Good     Standing balance support: No upper extremity supported Standing balance-Leahy Scale: Fair                               Pertinent Vitals/Pain Pain Assessment Pain Assessment: Faces Faces Pain Scale: No hurt    Home Living Family/patient expects to be discharged to:: Private residence Living Arrangements: Spouse/significant other Available Help at Discharge: Family;Available 24 hours/day Type of Home: House Home Access: Stairs to enter Entrance Stairs-Rails: Can reach both Entrance Stairs-Number of Steps: 3   Home Layout: One level Home Equipment: Cane - single point      Prior Function Prior Level of Function : Independent/Modified Independent  Mobility Comments: SPC       Hand Dominance   Dominant Hand: Right    Extremity/Trunk Assessment   Upper Extremity Assessment Upper Extremity Assessment: Generalized weakness    Lower Extremity Assessment Lower Extremity Assessment: Overall WFL for tasks assessed    Cervical / Trunk Assessment Cervical / Trunk Assessment: Normal  Communication   Communication:  No difficulties  Cognition Arousal/Alertness: Awake/alert Behavior During Therapy: WFL for tasks assessed/performed Overall Cognitive Status: Within Functional Limits for tasks assessed                                          General Comments      Exercises     Assessment/Plan    PT Assessment Patient needs continued PT services  PT Problem List Decreased strength;Decreased balance;Decreased cognition;Decreased range of motion;Decreased mobility;Cardiopulmonary status limiting activity;Decreased activity tolerance       PT Treatment Interventions DME instruction;Functional mobility training;Balance training;Patient/family education;Gait training;Therapeutic activities;Neuromuscular re-education;Stair training;Therapeutic exercise    PT Goals (Current goals can be found in the Care Plan section)  Acute Rehab PT Goals Patient Stated Goal: to return home PT Goal Formulation: With patient Time For Goal Achievement: 02/27/22 Potential to Achieve Goals: Good    Frequency Min 2X/week     Co-evaluation               AM-PAC PT "6 Clicks" Mobility  Outcome Measure Help needed turning from your back to your side while in a flat bed without using bedrails?: A Little Help needed moving from lying on your back to sitting on the side of a flat bed without using bedrails?: A Little Help needed moving to and from a bed to a chair (including a wheelchair)?: A Little Help needed standing up from a chair using your arms (e.g., wheelchair or bedside chair)?: A Little Help needed to walk in hospital room?: A Lot Help needed climbing 3-5 steps with a railing? : A Lot 6 Click Score: 16    End of Session Equipment Utilized During Treatment: Other (comment) (SPC) Activity Tolerance: Patient limited by fatigue Patient left: in chair;with call bell/phone within reach;with family/visitor present;with chair alarm set Nurse Communication: Mobility status PT Visit Diagnosis:  Unsteadiness on feet (R26.81);Other abnormalities of gait and mobility (R26.89);Muscle weakness (generalized) (M62.81);Difficulty in walking, not elsewhere classified (R26.2)    Time: 9024-0973 PT Time Calculation (min) (ACUTE ONLY): 33 min   Charges:              Claiborne Billings O'Daniel, SPT  02/13/2022, 11:49 AM

## 2022-02-13 NOTE — Plan of Care (Signed)
  Problem: Education: Goal: Knowledge of risk factors and measures for prevention of condition will improve Outcome: Progressing   Problem: Coping: Goal: Psychosocial and spiritual needs will be supported Outcome: Progressing   Problem: Respiratory: Goal: Will maintain a patent airway Outcome: Progressing   Problem: Respiratory: Goal: Complications related to the disease process, condition or treatment will be avoided or minimized Outcome: Progressing   Problem: Education: Goal: Knowledge of General Education information will improve Description: Including pain rating scale, medication(s)/side effects and non-pharmacologic comfort measures Outcome: Progressing   Problem: Nutrition: Goal: Adequate nutrition will be maintained Outcome: Progressing   Problem: Coping: Goal: Level of anxiety will decrease Outcome: Progressing   Problem: Pain Managment: Goal: General experience of comfort will improve Outcome: Progressing   Problem: Safety: Goal: Ability to remain free from injury will improve Outcome: Progressing   Problem: Skin Integrity: Goal: Risk for impaired skin integrity will decrease Outcome: Progressing

## 2022-02-13 NOTE — Plan of Care (Signed)
  Problem: Coping: Goal: Psychosocial and spiritual needs will be supported Outcome: Progressing   Problem: Education: Goal: Knowledge of General Education information will improve Description: Including pain rating scale, medication(s)/side effects and non-pharmacologic comfort measures Outcome: Progressing   Problem: Nutrition: Goal: Adequate nutrition will be maintained Outcome: Progressing   Problem: Coping: Goal: Level of anxiety will decrease Outcome: Progressing   Problem: Elimination: Goal: Will not experience complications related to urinary retention Outcome: Progressing   Problem: Safety: Goal: Ability to remain free from injury will improve Outcome: Progressing

## 2022-02-13 NOTE — Progress Notes (Signed)
PHARMACY CONSULT NOTE - FOLLOW UP  Pharmacy Consult for Electrolyte Monitoring and Replacement   Recent Labs: Potassium (mmol/L)  Date Value  02/13/2022 3.9   Magnesium (mg/dL)  Date Value  02/13/2022 2.0   Calcium (mg/dL)  Date Value  02/13/2022 8.0 (L)   Albumin (g/dL)  Date Value  02/13/2022 2.7 (L)   Phosphorus (mg/dL)  Date Value  02/13/2022 1.8 (L)   Sodium (mmol/L)  Date Value  02/13/2022 138     Assessment: 81 y.o. male admitted on 02/10/2022 with Respiratory Distress and pneumonia. PMH includes ETOh abuse , IDA, Afib, and chronic diarrhea. Pharmacy consulted to assist in monitoring and replacing electrolytes as part of CCM management.  Mg 2.0, K 3.9, Phos 1.8, corrected calcium 9.4  Goal of Therapy:  Electrolytes WNL  Plan:  Will replace K and phos with Kphos 30 mmol IVPB x 1 dose DC electrolytes consult after today, patient not ICU anymore.  Ifeanyichukwu Wickham Rodriguez-Guzman PharmD, BCPS 02/13/2022 7:32 AM

## 2022-02-13 NOTE — Progress Notes (Signed)
OT Cancellation Note  Patient Details Name: Marcus Coleman MRN: [id number] DOB: 1940/04/04   Cancelled Treatment:    Reason Eval/Treat Not Completed: Other (comment). OT orders received, chart reviewed. Pt just finished PT evaluation and is currently eating breakfast. According to PT, HR up to 140s in bed. Will re-attempt as able.   Doneta Public 02/13/2022, 9:25 AM

## 2022-02-13 NOTE — Progress Notes (Signed)
PROGRESS NOTE    Marcus Coleman   TMH:[id number] DOB: 24-Apr-1940  DOA: 02/10/2022 Date of Service: 02/13/22 PCP: Idelle Crouch, MD     Brief Narrative / Hospital Course:  The patient is a 81 year old male with history of left shoulder surgery done 1 day prior to ER arrival. Patient came to ER on December 1. 911 was called for apparent cardiac arrest.  12/01 EMS found patient to have a pulse but was unresponsive and hypoxic. Patient does not remember the events of transport however remembers waking up in the ER. In the ER patient was found to have blood pressure 102/50 pulse 47, he was found to have left lower lobe pneumonia. Mental status improved in the ER. Patient was hypotensive in the ER, lactic acidosis was present 4.7. Upon arrival to the ICU patient received additional fluids and did not require vasopressors. Bradycardia also improved patient does take Lopressor and Cardizem at baseline and was also taking opiates.  12/02: remain in ICU but improving. Still bradycardic w/ brief episode unresponsiveness resolved spontaneously, suspect syncope d/t postural hypotension. Cardiology saw patient -  12/03: TRH pickup. Needing mask overnight to sleep well, may benefit from CPAP 12/04: rapid Afib this morning but responded well to IV cardizem injection - he'd not yet had AM meds and his rate control meds had been initially held d/t bradycardia as able- cardizem po resumed at lower dose. PT/OT recs for [facility].   Consultants:  none  Procedures: none   Vanc 12/01-12/02 Cefepime since 02/10/22 for CAP coverage    ASSESSMENT & PLAN:    Severe sepsis Pneumonia Cardiomegaly History of atrial fibrillation however had GI bleed on Eliquis. Vasovagal syncope Hiatal hernia Acute hypoxic respiratory failure. Symptomatic bradycardia.  Likely secondary to using opiates Lopressor and Cardizem and being postop. Patient had hematuria with intermittent catheterization  Sepsis due suspected  CAP Acute Hypoxic Respiratory Failure secondary to LLL CAP & Mild Pulmonary Edema Supplemental O2 to maintain SpO2 > 90% Continue CAP coverage: cefepime. MRSA negative, will d/c vancomycin budesonide inhaler/nebs BID, bronchodilators PRN consider NPPV BIPAP PRN   Symptomatic Bradycardia Presenting with HR in the low 40's, hypotension and ?syncopal episode PMH: Persistent Atrial fibrillation not anticoagulated Hx GI Bleed while on Eliquis, HTN EKG shows significant prolonged QTC>750 CTA negative for PE Check TSH, Free T4 --> WNL Monitor BMP+Mag Hold AV nodal drugs and QTC prolonging medication - holding home Metoprolol and Diltiazem for now Obtain 2D Echo --> EF 50-55, global LV hypokinesis, possible RV volume overload Cardiology recs no indication for pacemaker at this time    Rotator cuff arthropathy, left. Nontraumatic complete tear of left rotator cuff  S/p left reverse total shoulder arthroplasty with Dr. Roland Rack on 02/09/2022.  Continue splint Ortho consult as needed    Chronic IDA Hgb 8.8 on admission Check Iron panel --> confirms iron deficiency  He last received Feraheme on November 07, 2021.  Follows with oncology Monitor CBC    DVT prophylaxis: lovenox Pertinent IV fluids/nutrition: no continuous IV fluids  Central lines / invasive devices: none  Code Status: FULL CODE   Disposition: inpatient TOC needs: needs HH  Barriers to discharge / significant pending items: if remains stable overnight, hopefully can d/c tomorrow              Subjective:  Patient reports doing well today - palpitations resovled w/ cardizem Reports constipation and would like bowel regimen meds.  Denies CP/SOB.  Pain controlled.  Denies new weakness.  Tolerating diet.  Reports no concerns w/ urination.   Family Communication: none at this time     Objective Findings:  Vitals:   02/13/22 0216 02/13/22 0435 02/13/22 0730 02/13/22 0756  BP:   136/80   Pulse:   97   Resp:    14   Temp:   98.6 F (37 C)   TempSrc:   Oral   SpO2: 95%  97% 94%  Weight:  106.4 kg    Height:        Intake/Output Summary (Last 24 hours) at 02/13/2022 1429 Last data filed at 02/13/2022 1300 Gross per 24 hour  Intake 843 ml  Output 1400 ml  Net -557 ml   Filed Weights   02/11/22 0500 02/12/22 0500 02/13/22 0435  Weight: 97.6 kg 90.9 kg 106.4 kg    Examination:  Constitutional:  VS as above General Appearance: alert, well-developed, well-nourished, NAD Respiratory: Normal respiratory effort No wheeze No rhonchi No rales Cardiovascular: S1/S2 normal No murmur No rub/gallop auscultated Gastrointestinal: No tenderness Musculoskeletal:  No clubbing/cyanosis of digits L arm in sling  Neurological: No cranial nerve deficit on limited exam Alert Psychiatric: Normal judgment/insight Normal mood and affect       Scheduled Medications:   budesonide (PULMICORT) nebulizer solution  0.25 mg Nebulization BID   diltiazem  30 mg Oral Q12H   enoxaparin (LOVENOX) injection  0.5 mg/kg Subcutaneous Q24H   fluticasone  1 spray Each Nare Daily   furosemide  40 mg Oral BH-q7a   ipratropium-albuterol  3 mL Nebulization BID   loratadine  10 mg Oral Daily   pantoprazole  40 mg Oral Daily   polyethylene glycol  17 g Oral BID   senna-docusate  2 tablet Oral BID   sodium chloride flush  3 mL Intravenous Q12H   tamsulosin  0.4 mg Oral Daily   [START ON 02/14/2022] timolol  1 drop Both Eyes Daily    Continuous Infusions:  sodium chloride 0 mL (02/10/22 1718)   sodium chloride Stopped (02/12/22 1349)   ceFEPime (MAXIPIME) IV 2 g (02/13/22 0547)   potassium PHOSPHATE IVPB (in mmol) 30 mmol (02/13/22 1007)   sodium chloride      PRN Medications:  sodium chloride, atropine, bisacodyl, docusate sodium, ipratropium-albuterol, ondansetron (ZOFRAN) IV, mouth rinse, sodium chloride  Antimicrobials:  Anti-infectives (From admission, onward)    Start     Dose/Rate Route  Frequency Ordered Stop   02/12/22 1400  ceFEPIme (MAXIPIME) 2 g in sodium chloride 0.9 % 100 mL IVPB        2 g 200 mL/hr over 30 Minutes Intravenous Every 8 hours 02/12/22 0859     02/11/22 1700  vancomycin (VANCOREADY) IVPB 1750 mg/350 mL  Status:  Discontinued        1,750 mg 175 mL/hr over 120 Minutes Intravenous Every 24 hours 02/10/22 1830 02/11/22 1451   02/11/22 0430  ceFEPIme (MAXIPIME) 1 g in sodium chloride 0.9 % 100 mL IVPB  Status:  Discontinued        1 g 200 mL/hr over 30 Minutes Intravenous Every 12 hours 02/10/22 1817 02/10/22 1822   02/10/22 2200  ceFEPIme (MAXIPIME) 1 g in sodium chloride 0.9 % 100 mL IVPB  Status:  Discontinued        1 g 200 mL/hr over 30 Minutes Intravenous Every 8 hours 02/10/22 1822 02/12/22 0859   02/10/22 1630  vancomycin (VANCOREADY) IVPB 2000 mg/400 mL        2,000 mg 200 mL/hr over 120 Minutes Intravenous  Once 02/10/22 1622 02/10/22 1908   02/10/22 1615  ceFEPIme (MAXIPIME) 2 g in sodium chloride 0.9 % 100 mL IVPB        2 g 200 mL/hr over 30 Minutes Intravenous  Once 02/10/22 1610 02/10/22 1716   02/10/22 1615  metroNIDAZOLE (FLAGYL) IVPB 500 mg        500 mg 100 mL/hr over 60 Minutes Intravenous  Once 02/10/22 1610 02/10/22 1742   02/10/22 1615  vancomycin (VANCOCIN) IVPB 1000 mg/200 mL premix  Status:  Discontinued        1,000 mg 200 mL/hr over 60 Minutes Intravenous  Once 02/10/22 1610 02/10/22 1622           Data Reviewed: I have personally reviewed following labs and imaging studies  CBC: Recent Labs  Lab 02/10/22 1543 02/11/22 0450 02/12/22 0341 02/13/22 0346  WBC 13.6* 10.7* 7.4 5.3  NEUTROABS  --  8.2* 4.9 3.4  HGB 8.8* 8.6* 7.9* 7.4*  HCT 30.2* 29.0* 25.9* 23.7*  MCV 89.9 88.4 86.3 84.0  PLT 295 274 205 308   Basic Metabolic Panel: Recent Labs  Lab 02/07/22 1220 02/10/22 1543 02/11/22 0450 02/11/22 0725 02/12/22 0341 02/13/22 0346  NA 141 140 140  --  139 138  K 4.2 5.1 4.6  --  4.1 3.9  CL 106 106  109  --  108 106  CO2 '30 24 25  '$ --  26 27  GLUCOSE 114* 154* 107*  --  111* 139*  BUN 25* 21 25*  --  24* 19  CREATININE 0.71 0.92 0.78  --  0.79 0.60*  CALCIUM 8.9 8.8* 8.4*  --  8.1* 8.0*  MG  --   --   --  1.9 1.8 2.0  PHOS  --   --   --  3.4 2.7 1.8*   GFR: Estimated Creatinine Clearance: 85.6 mL/min (A) (by C-G formula based on SCr of 0.6 mg/dL (L)). Liver Function Tests: Recent Labs  Lab 02/10/22 1543 02/11/22 0450 02/12/22 0341 02/13/22 0346  AST 31 48* 55* 57*  ALT 10 30 49* 60*  ALKPHOS 71 64 62 62  BILITOT 1.1 0.8 1.0 0.8  PROT 6.4* 5.9* 5.6* 5.5*  ALBUMIN 3.3* 3.3* 2.9* 2.7*   No results for input(s): "LIPASE", "AMYLASE" in the last 168 hours. No results for input(s): "AMMONIA" in the last 168 hours. Coagulation Profile: Recent Labs  Lab 02/10/22 1543  INR 1.3*   Cardiac Enzymes: No results for input(s): "CKTOTAL", "CKMB", "CKMBINDEX", "TROPONINI" in the last 168 hours. BNP (last 3 results) No results for input(s): "PROBNP" in the last 8760 hours. HbA1C: No results for input(s): "HGBA1C" in the last 72 hours. CBG: Recent Labs  Lab 02/10/22 1538 02/10/22 1907  GLUCAP 143* 118*   Lipid Profile: No results for input(s): "CHOL", "HDL", "LDLCALC", "TRIG", "CHOLHDL", "LDLDIRECT" in the last 72 hours. Thyroid Function Tests: Recent Labs    02/11/22 0450  TSH 3.338  FREET4 0.70   Anemia Panel: Recent Labs    02/11/22 0450  VITAMINB12 333  FOLATE 7.0  FERRITIN 19*  TIBC 456*  IRON 23*  RETICCTPCT 2.0   Most Recent Urinalysis On File:     Component Value Date/Time   COLORURINE AMBER (A) 02/10/2022 1637   APPEARANCEUR CLOUDY (A) 02/10/2022 1637   APPEARANCEUR Clear 01/13/2022 0905   LABSPEC 1.023 02/10/2022 1637   PHURINE 5.0 02/10/2022 1637   GLUCOSEU NEGATIVE 02/10/2022 1637   HGBUR SMALL (A) 02/10/2022 1637   BILIRUBINUR NEGATIVE  02/10/2022 1637   BILIRUBINUR Negative 01/13/2022 0905   KETONESUR 5 (A) 02/10/2022 1637   PROTEINUR >=300  (A) 02/10/2022 1637   NITRITE NEGATIVE 02/10/2022 1637   LEUKOCYTESUR NEGATIVE 02/10/2022 1637   Sepsis Labs: '@LABRCNTIP'$ (procalcitonin:4,lacticidven:4)  Recent Results (from the past 240 hour(s))  Surgical pcr screen     Status: None   Collection Time: 02/07/22 12:18 PM   Specimen: Nasal Mucosa; Nasal Swab  Result Value Ref Range Status   MRSA, PCR NEGATIVE NEGATIVE Final   Staphylococcus aureus NEGATIVE NEGATIVE Final    Comment: (NOTE) The Xpert SA Assay (FDA approved for NASAL specimens in patients 77 years of age and older), is one component of a comprehensive surveillance program. It is not intended to diagnose infection nor to guide or monitor treatment. Performed at [facility], [address] [place], [place] [address]   Blood culture (routine x 2)     Status: None (Preliminary result)   Collection Time: 02/10/22  4:37 PM   Specimen: BLOOD  Result Value Ref Range Status   Specimen Description BLOOD BLOOD RIGHT HAND  Final   Special Requests   Final    BOTTLES DRAWN AEROBIC AND ANAEROBIC Blood Culture results may not be optimal due to an inadequate volume of blood received in culture bottles   Culture   Final    NO GROWTH 3 DAYS Performed at [facility], [address] [place], [place] [address]    Report Status PENDING  Incomplete  SARS Coronavirus 2 by RT PCR (hospital order, performed in [facility] hospital lab) *cepheid single result test* Anterior Nasal Swab     Status: None   Collection Time: 02/10/22  4:37 PM   Specimen: Anterior Nasal Swab  Result Value Ref Range Status   SARS Coronavirus 2 by RT PCR NEGATIVE NEGATIVE Final    Comment: (NOTE) SARS-CoV-2 target nucleic acids are NOT DETECTED.  The SARS-CoV-2 RNA is generally detectable in upper and lower respiratory specimens during the acute phase of infection. The lowest concentration of SARS-CoV-2 viral copies this assay can detect is 250 copies / mL. A negative result does not  preclude SARS-CoV-2 infection and should not be used as the sole basis for treatment or other patient management decisions.  A negative result may occur with improper specimen collection / handling, submission of specimen other than nasopharyngeal swab, presence of viral mutation(s) within the areas targeted by this assay, and inadequate number of viral copies (<250 copies / mL). A negative result must be combined with clinical observations, patient history, and epidemiological information.  Fact Sheet for Patients:   https://www.patel.info/  Fact Sheet for Healthcare Providers: https://hall.com/  This test is not yet approved or  cleared by the [place] FDA and has been authorized for detection and/or diagnosis of SARS-CoV-2 by FDA under an Emergency Use Authorization (EUA).  This EUA will remain in effect (meaning this test can be used) for the duration of the COVID-19 declaration under Section 564(b)(1) of the Act, 21 U.S.C. section 360bbb-3(b)(1), unless the authorization is terminated or revoked sooner.  Performed at [facility], [address] [place], [place] [address]   MRSA Next Gen by PCR, Nasal     Status: None   Collection Time: 02/10/22  9:07 PM   Specimen: Nasal Mucosa; Nasal Swab  Result Value Ref Range Status   MRSA by PCR Next Gen NOT DETECTED NOT DETECTED Final    Comment: (NOTE) The GeneXpert MRSA Assay (FDA approved for NASAL  specimens only), is one component of a comprehensive MRSA colonization surveillance program. It is not intended to diagnose MRSA infection nor to guide or monitor treatment for MRSA infections. Test performance is not FDA approved in patients less than 40 years old. Performed at [facility], [address] [place], [place] [address]   Blood culture (routine x 2)     Status: None (Preliminary result)   Collection Time: 02/11/22  4:50 AM   Specimen: BLOOD RIGHT HAND   Result Value Ref Range Status   Specimen Description BLOOD RIGHT HAND  Final   Special Requests Blood Culture adequate volume  Final   Culture   Final    NO GROWTH 2 DAYS Performed at [facility], [address] [place], [place] [address]    Report Status PENDING  Incomplete         Radiology Studies: DG Chest Port 1 View  Result Date: 02/11/2022 CLINICAL DATA:  Shortness of breath.  Sepsis. EXAM: PORTABLE CHEST 1 VIEW COMPARISON:  02/10/2022 FINDINGS: Low lung volumes are again noted. Small left pleural effusion is increased in size. Left lower lobe atelectasis or infiltrate also shows mild increase. Right lung remains clear. Left shoulder prosthesis and overlying skin staples again noted. IMPRESSION: Increased small left pleural effusion and left lower lobe atelectasis or infiltrate. Electronically Signed   By: Marlaine Hind M.D.   On: 02/11/2022 13:08   ECHOCARDIOGRAM COMPLETE  Result Date: 02/11/2022    ECHOCARDIOGRAM REPORT   Patient Name:   Marcus Coleman Date of Exam: 02/11/2022 Medical Rec #:  [id number]      Height:       66.0 in Accession #:    [id number]     Weight:       215.2 lb Date of Birth:  12-07-1940       BSA:          2.063 m Patient Age:    81 years       BP:           104/91 mmHg Patient Gender: M              HR:           91 bpm. Exam Location:  [facility] Procedure: 2D Echo Indications:     Syncope R55  History:         Patient has no prior history of Echocardiogram examinations.  Sonographer:     Kathlen Brunswick RDCS Referring Phys:  [place] Diagnosing Phys: Yolonda Kida MD  Sonographer Comments: Technically challenging study due to limited acoustic windows, suboptimal apical window and no subcostal window. Very challenging study due to patient's recent left shoulder surgery, unable to remove sling to acquire better images. IMPRESSIONS  1. Left ventricular ejection fraction, by estimation, is 50 to 55%. The left ventricle has low normal  function. The left ventricle demonstrates global hypokinesis. The left ventricular internal cavity size was moderately dilated. Left ventricular diastolic function could not be evaluated. There is the interventricular septum is flattened in diastole ('D' shaped left ventricle), consistent with right ventricular volume overload.  2. Right ventricular systolic function is moderately reduced. The right ventricular size is moderately enlarged. Mildly increased right ventricular wall thickness.  3. Left atrial size was mildly dilated.  4. Right atrial size was moderately dilated.  5. The mitral valve is myxomatous. Mild to moderate mitral valve regurgitation.  6. Tricuspid valve regurgitation is moderate.  7. The aortic valve  is normal in structure. Aortic valve regurgitation is not visualized. FINDINGS  Left Ventricle: Left ventricular ejection fraction, by estimation, is 50 to 55%. The left ventricle has low normal function. The left ventricle demonstrates global hypokinesis. The left ventricular internal cavity size was moderately dilated. There is no left ventricular hypertrophy. The interventricular septum is flattened in diastole ('D' shaped left ventricle), consistent with right ventricular volume overload. Left ventricular diastolic function could not be evaluated. Right Ventricle: The right ventricular size is moderately enlarged. Mildly increased right ventricular wall thickness. Right ventricular systolic function is moderately reduced. Left Atrium: Left atrial size was mildly dilated. Right Atrium: Right atrial size was moderately dilated. Pericardium: There is no evidence of pericardial effusion. Mitral Valve: The mitral valve is myxomatous. Mild to moderate mitral valve regurgitation. Tricuspid Valve: The tricuspid valve is grossly normal. Tricuspid valve regurgitation is moderate. Aortic Valve: The aortic valve is normal in structure. Aortic valve regurgitation is not visualized. Pulmonic Valve: The  pulmonic valve was grossly normal. Pulmonic valve regurgitation is trivial. Aorta: The ascending aorta was not well visualized. IAS/Shunts: No atrial level shunt detected by color flow Doppler.  LEFT VENTRICLE PLAX 2D LVIDd:         5.20 cm   Diastology LVIDs:         3.70 cm   LV e' medial:    12.20 cm/s LV PW:         1.00 cm   LV E/e' medial:  8.3 LV IVS:        1.20 cm   LV e' lateral:   12.60 cm/s LVOT diam:     1.80 cm   LV E/e' lateral: 8.0 LV SV:         35 LV SV Index:   17 LVOT Area:     2.54 cm  RIGHT VENTRICLE RV Basal diam:  5.10 cm TAPSE (M-mode): 1.6 cm LEFT ATRIUM         Index       RIGHT ATRIUM           Index LA diam:    4.60 cm 2.23 cm/m  RA Area:     36.10 cm                                 RA Volume:   154.00 ml 74.65 ml/m  AORTIC VALVE LVOT Vmax:   80.60 cm/s LVOT Vmean:  48.300 cm/s LVOT VTI:    0.136 m  AORTA Ao Root diam: 3.00 cm MITRAL VALVE                TRICUSPID VALVE MV Area (PHT): 3.79 cm     TR Peak grad:   53.9 mmHg MV Decel Time: 200 msec     TR Vmax:        367.00 cm/s MV E velocity: 101.00 cm/s                             SHUNTS                             Systemic VTI:  0.14 m                             Systemic Diam: 1.80 cm Dwayne D Callwood  MD Electronically signed by Yolonda Kida MD Signature Date/Time: 02/11/2022/1:04:32 PM    Final             LOS: 3 days       Emeterio Reeve, DO [facility] 02/13/2022, 2:29 PM    Dictation software may have been used to generate the above note. Typos may occur and escape review in typed/dictated notes. Please contact Dr Sheppard Coil directly for clarity if needed.  Staff may message me via secure chat in [place]  but this may not receive an immediate response,  please page me for urgent matters!  If 7PM-7AM, please contact night coverage [email or web address]

## 2022-02-14 DIAGNOSIS — A419 Sepsis, unspecified organism: Secondary | ICD-10-CM | POA: Diagnosis not present

## 2022-02-14 DIAGNOSIS — R6520 Severe sepsis without septic shock: Secondary | ICD-10-CM | POA: Diagnosis not present

## 2022-02-14 LAB — COMPREHENSIVE METABOLIC PANEL
ALT: 59 U/L — ABNORMAL HIGH (ref 0–44)
AST: 44 U/L — ABNORMAL HIGH (ref 15–41)
Albumin: 2.7 g/dL — ABNORMAL LOW (ref 3.5–5.0)
Alkaline Phosphatase: 71 U/L (ref 38–126)
Anion gap: 4 — ABNORMAL LOW (ref 5–15)
BUN: 13 mg/dL (ref 8–23)
CO2: 32 mmol/L (ref 22–32)
Calcium: 8.2 mg/dL — ABNORMAL LOW (ref 8.9–10.3)
Chloride: 104 mmol/L (ref 98–111)
Creatinine, Ser: 0.6 mg/dL — ABNORMAL LOW (ref 0.61–1.24)
GFR, Estimated: >60 mL/min (ref >60)
Glucose, Bld: 112 mg/dL — ABNORMAL HIGH (ref 70–99)
Potassium: 4.4 mmol/L (ref 3.5–5.1)
Sodium: 140 mmol/L (ref 135–145)
Total Bilirubin: 1 mg/dL (ref 0.3–1.2)
Total Protein: 5.6 g/dL — ABNORMAL LOW (ref 6.5–8.1)

## 2022-02-14 LAB — GLUCOSE, CAPILLARY: Glucose-Capillary: 98 mg/dL (ref 70–99)

## 2022-02-14 LAB — CBC WITH DIFFERENTIAL/PLATELET
Abs Immature Granulocytes: 0.02 10*3/uL (ref 0.00–0.07)
Basophils Absolute: 0 10*3/uL (ref 0.0–0.1)
Basophils Relative: 0 %
Eosinophils Absolute: 0.2 10*3/uL (ref 0.0–0.5)
Eosinophils Relative: 4 %
HCT: 25.2 % — ABNORMAL LOW (ref 39.0–52.0)
Hemoglobin: 7.7 g/dL — ABNORMAL LOW (ref 13.0–17.0)
Immature Granulocytes: 0 %
Lymphocytes Relative: 18 %
Lymphs Abs: 0.9 10*3/uL (ref 0.7–4.0)
MCH: 25.9 pg — ABNORMAL LOW (ref 26.0–34.0)
MCHC: 30.6 g/dL (ref 30.0–36.0)
MCV: 84.8 fL (ref 80.0–100.0)
Monocytes Absolute: 1 10*3/uL (ref 0.1–1.0)
Monocytes Relative: 19 %
Neutro Abs: 2.9 10*3/uL (ref 1.7–7.7)
Neutrophils Relative %: 59 %
Platelets: 215 10*3/uL (ref 150–400)
RBC: 2.97 MIL/uL — ABNORMAL LOW (ref 4.22–5.81)
RDW: 17.5 % — ABNORMAL HIGH (ref 11.5–15.5)
WBC: 5 10*3/uL (ref 4.0–10.5)
nRBC: 0 % (ref 0.0–0.2)

## 2022-02-14 LAB — PHOSPHORUS: Phosphorus: 3.1 mg/dL (ref 2.5–4.6)

## 2022-02-14 LAB — MAGNESIUM: Magnesium: 2 mg/dL (ref 1.7–2.4)

## 2022-02-14 MED ORDER — ENOXAPARIN SODIUM 60 MG/0.6ML IJ SOSY: 0.5000 mg/kg | PREFILLED_SYRINGE | INTRAMUSCULAR | Status: DC

## 2022-02-14 MED ORDER — PANTOPRAZOLE SODIUM 40 MG PO TBEC: 40.0000 mg | DELAYED_RELEASE_TABLET | Freq: Every day | ORAL | 0 refills | Status: AC

## 2022-02-14 MED ORDER — TAMSULOSIN HCL 0.4 MG PO CAPS: 0.4000 mg | ORAL_CAPSULE | Freq: Every day | ORAL | 0 refills | Status: DC

## 2022-02-14 MED ORDER — DILTIAZEM HCL 30 MG PO TABS: 30.0000 mg | ORAL_TABLET | Freq: Two times a day (BID) | ORAL | 0 refills | Status: DC

## 2022-02-14 MED ORDER — SODIUM CHLORIDE 0.9 % IV SOLN
300.0000 mg | Freq: Once | INTRAVENOUS | Status: AC
  Administered 2022-02-14: 300 mg via INTRAVENOUS
  Filled 2022-02-14: qty 300

## 2022-02-14 MED ORDER — FLUTICASONE PROPIONATE 50 MCG/ACT NA SUSP: 1.0000 | Freq: Every day | NASAL | 0 refills | Status: DC

## 2022-02-14 MED ORDER — SALINE SPRAY 0.65 % NA SOLN: 1.0000 | NASAL | 0 refills | Status: DC | PRN

## 2022-02-14 NOTE — Plan of Care (Signed)
  Problem: Education: Goal: Knowledge of risk factors and measures for prevention of condition will improve Outcome: Progressing   Problem: Coping: Goal: Psychosocial and spiritual needs will be supported Outcome: Progressing   Problem: Respiratory: Goal: Will maintain a patent airway Outcome: Progressing   Problem: Education: Goal: Knowledge of General Education information will improve Description: Including pain rating scale, medication(s)/side effects and non-pharmacologic comfort measures Outcome: Progressing   Problem: Health Behavior/Discharge Planning: Goal: Ability to manage health-related needs will improve Outcome: Progressing   Problem: Clinical Measurements: Goal: Respiratory complications will improve Outcome: Progressing   Problem: Clinical Measurements: Goal: Cardiovascular complication will be avoided Outcome: Progressing   Problem: Activity: Goal: Risk for activity intolerance will decrease Outcome: Progressing   Problem: Nutrition: Goal: Adequate nutrition will be maintained Outcome: Progressing   Problem: Coping: Goal: Level of anxiety will decrease Outcome: Progressing   Problem: Pain Managment: Goal: General experience of comfort will improve Outcome: Progressing   Problem: Safety: Goal: Ability to remain free from injury will improve Outcome: Progressing   Problem: Skin Integrity: Goal: Risk for impaired skin integrity will decrease Outcome: Progressing

## 2022-02-14 NOTE — TOC Transition Note (Addendum)
Transition of Care [facility]) - CM/SW Discharge Note   Patient Details  Name: Marcus Coleman MRN: [id number] Date of Birth: January 26, 1941  Transition of Care [facility]) CM/SW Contact:  Laurena Slimmer, RN Phone Number: 02/14/2022, 12:38 PM   Clinical Narrative:    Spoke with patient regarding therapy recommendation for [facility]. Patient is agreeable to [facility]. He does not have a preference of  an agency. He was advised the agency will call him directly once accepted to schedule the [facility]. His wife will transport him home. No DME recommended.   Referral sent and accepted to [place] from [place].   TOC signing off.          Patient Goals and CMS Choice        Discharge Placement                       Discharge Plan and Services                                     Social Determinants of Health (SDOH) Interventions Housing Interventions: Intervention Not Indicated   Readmission Risk Interventions     No data to display

## 2022-02-14 NOTE — Progress Notes (Addendum)
Physical Therapy Treatment Patient Details Name: Marcus Coleman MRN: [id number] DOB: 02/14/1941 Today's Date: 02/14/2022   History of Present Illness 81 yo male with sepsis s/s to pnemonia. PMH includes afib, RLE DVT not on anticoagulation, HTN, dyslipidemia, IDA receiving Feraheme, and left total shoulder    PT Comments    Pt found supine in bed upon PT entry. Pt mod I with bed mobility and CGA with SPC for Sit<>Stand.Pt ambulated 60 ft with SPC and CGA that progressed to supervision. Pt required one standing rest break that he initiated. SPO2 dropped to 86-88% and returned to WNL when pt cued to breathe. Pt would benefit from skilled physical therapy to address the listed deficits (see below) to increase independence with ADLs and function. Current recommendation remains appropriate.      Recommendations for follow up therapy are one component of a multi-disciplinary discharge planning process, led by the attending physician.  Recommendations may be updated based on patient status, additional functional criteria and insurance authorization.  Follow Up Recommendations  Home health PT     Assistance Recommended at Discharge Intermittent Supervision/Assistance  Patient can return home with the following A little help with walking and/or transfers;A little help with bathing/dressing/bathroom;Assistance with cooking/housework;Help with stairs or ramp for entrance;Assist for transportation   Equipment Recommendations  None recommended by PT    Recommendations for Other Services       Precautions / Restrictions Precautions Precautions: Fall Precaution Comments: LUE shoulder sling/immobilizer Restrictions Weight Bearing Restrictions: Yes LUE Weight Bearing: Non weight bearing     Mobility  Bed Mobility Overal bed mobility: Modified Independent Bed Mobility: Supine to Sit     Supine to sit: Modified independent (Device/Increase time)          Transfers Overall transfer  level: Needs assistance Equipment used: Straight cane Transfers: Sit to/from Stand Sit to Stand: Min guard                Ambulation/Gait Ambulation/Gait assistance: Min guard, Supervision Gait Distance (Feet): 60 Feet Assistive device: Straight cane Gait Pattern/deviations: Wide base of support, WFL(Within Functional Limits)       General Gait Details: bilateral trendelenburg gait pattern. required one standing rest break   Stairs             Wheelchair Mobility    Modified Rankin (Stroke Patients Only)       Balance Overall balance assessment: Needs assistance Sitting-balance support: No upper extremity supported, Feet supported Sitting balance-Leahy Scale: Good     Standing balance support: Single extremity supported Standing balance-Leahy Scale: Fair                              Cognition Arousal/Alertness: Awake/alert Behavior During Therapy: WFL for tasks assessed/performed Overall Cognitive Status: Within Functional Limits for tasks assessed                                          Exercises      General Comments        Pertinent Vitals/Pain Pain Assessment Faces Pain Scale: Hurts a little bit Pain Location: L shoulder Pain Descriptors / Indicators: Grimacing, Guarding, Sore Pain Intervention(s): Repositioned, Monitored during session, Limited activity within patient's tolerance    Home Living  Prior Function            PT Goals (current goals can now be found in the care plan section) Acute Rehab PT Goals Patient Stated Goal: to return home PT Goal Formulation: With patient Time For Goal Achievement: 02/27/22 Potential to Achieve Goals: Good Progress towards PT goals: Progressing toward goals    Frequency    Min 2X/week      PT Plan Current plan remains appropriate    Co-evaluation              AM-PAC PT "6 Clicks" Mobility   Outcome Measure   Help needed turning from your back to your side while in a flat bed without using bedrails?: A Little Help needed moving from lying on your back to sitting on the side of a flat bed without using bedrails?: A Little Help needed moving to and from a bed to a chair (including a wheelchair)?: A Little Help needed standing up from a chair using your arms (e.g., wheelchair or bedside chair)?: A Little Help needed to walk in hospital room?: A Little Help needed climbing 3-5 steps with a railing? : A Little 6 Click Score: 18    End of Session Equipment Utilized During Treatment: Other (comment) (SPC) Activity Tolerance: Patient limited by fatigue Patient left: in chair;with call bell/phone within reach;with chair alarm set Nurse Communication: Mobility status PT Visit Diagnosis: Unsteadiness on feet (R26.81);Other abnormalities of gait and mobility (R26.89);Muscle weakness (generalized) (M62.81);Difficulty in walking, not elsewhere classified (R26.2)     Time: 6967-8938 PT Time Calculation (min) (ACUTE ONLY): 18 min  Charges:  $Therapeutic Activity: 8-22 mins                     [employer], SPT  02/14/2022, 12:21 PM

## 2022-02-14 NOTE — Care Management Important Message (Signed)
Important Message  Patient Details  Name: Marcus Coleman MRN: [id number] Date of Birth: Oct 25, 1940   Medicare Important Message Given:  Yes     Juliann Pulse A Marvon [place] 02/14/2022, 2:35 PM

## 2022-02-14 NOTE — Discharge Summary (Signed)
Physician Discharge Summary   Patient: Marcus Coleman MRN: [id number]  DOB: 15-Sep-1940   Admit:     Date of Admission: 02/10/2022 Admitted from: home   Discharge: Date of discharge: 02/14/22 Disposition: Home health Condition at discharge: good  CODE STATUS: FULL CODE      Discharge Physician: Emeterio Reeve, DO [facility]     PCP: Idelle Crouch, MD  Recommendations for Outpatient Follow-up:  Follow up with PCP Idelle Crouch, MD in 1-2 weeks Please obtain labs/tests: CBC, BMP in 1-2 weeks, consider repeat CXR. Will need close follow up BP and HR - can have [facility] RN assess or f/u in PCP office  Please follow up on the following pending results: none FOllow as directed w/ orthopedics  PCP AND OTHER OUTPATIENT PROVIDERS: SEE BELOW FOR SPECIFIC DISCHARGE INSTRUCTIONS PRINTED FOR PATIENT IN ADDITION TO GENERIC AVS PATIENT INFO     Discharge Instructions     Call MD for:  difficulty breathing, headache or visual disturbances   Complete by: As directed    Call MD for:  persistant dizziness or light-headedness   Complete by: As directed    Call MD for:  redness, tenderness, or signs of infection (pain, swelling, redness, odor or green/yellow discharge around incision site)   Complete by: As directed    Call MD for:  severe uncontrolled pain   Complete by: As directed    Call MD for:  temperature >100.4   Complete by: As directed    Diet - low sodium heart healthy   Complete by: As directed    Discharge wound care:   Complete by: As directed    KEEP WOUND CLEAN/DRY AS DIRECTED BY [place] AS DIRECTED   Increase activity slowly   Complete by: As directed          Discharge Diagnoses: Principal Problem:   Severe sepsis ([place]) Active Problems:   Decreased responsiveness   Elevated lactic acid level       Hospital Course: The patient is a 81 year old male with history of left shoulder surgery done 1 day prior  to ER arrival. Patient came to ER on December 1. 911 was called for apparent cardiac arrest.  12/01 EMS found patient to have a pulse but was unresponsive and hypoxic. Patient does not remember the events of transport however remembers waking up in the ER. In the ER patient was found to have blood pressure 102/50 pulse 47, he was found to have left lower lobe pneumonia. Mental status improved in the ER. Patient was hypotensive in the ER, lactic acidosis was present 4.7. Upon arrival to the ICU patient received additional fluids and did not require vasopressors. Bradycardia also improved patient does take Lopressor and Cardizem at baseline and was also taking opiates.  12/02: remain in ICU but improving. Still bradycardic w/ brief episode unresponsiveness resolved spontaneously, suspect syncope d/t postural hypotension. Cardiology saw patient -  12/03: TRH pickup. Needing mask overnight to sleep well, may benefit from CPAP 12/04: rapid Afib this morning but responded well to IV cardizem injection - he'd not yet had AM meds and his rate control meds had been initially held d/t bradycardia as able- cardizem po resumed at lower dose. PT/OT recs for [facility].  12/05: HR improved today, he is on room air. Hgb 7.7, stable. Iron infusion ordered. Stable for discharge   Consultants:  none  Procedures: none   Vanc 12/01-12/02 Cefepime since 02/10/22 for CAP coverage - 5  days course completed   ASSESSMENT & PLAN:    Severe sepsis Pneumonia Cardiomegaly History of atrial fibrillation however had GI bleed on Eliquis. Vasovagal syncope Hiatal hernia Acute hypoxic respiratory failure. Symptomatic bradycardia.  Likely secondary to using opiates Lopressor and Cardizem and being postop. Patient had hematuria with intermittent catheterization  Sepsis due suspected CAP Acute Hypoxic Respiratory Failure secondary to LLL CAP & Mild Pulmonary Edema Supplemental O2 to maintain SpO2 > 90% --> has been on room  air Continue CAP coverage: cefepime completed 5 days. MRSA negative, will d/c vancomycin budesonide inhaler/nebs BID, bronchodilators PRN --> albuterol prn on discharge  consider NPPV BIPAP PRN --> outpatient sleep study recommended    Symptomatic Bradycardia Presenting with HR in the low 40's, hypotension and ?syncopal episode PMH: Persistent Atrial fibrillation not anticoagulated Hx GI Bleed while on Eliquis, HTN EKG shows significant prolonged QTC>750 CTA negative for PE Check TSH, Free T4 --> WNL Monitor BMP+Mag Hold AV nodal drugs and QTC prolonging medication - holding home Metoprolol and Diltiazem for now --> restarted lower dose diltiazem later in hospitalization and continue on discharge  Obtain 2D Echo --> EF 50-55, global LV hypokinesis, possible RV volume overload Cardiology recs no indication for pacemaker at this time    Rotator cuff arthropathy, left. Nontraumatic complete tear of left rotator cuff  S/p left reverse total shoulder arthroplasty with Dr. Roland Rack on 02/09/2022.  Continue splint Ortho consult as needed    Chronic IDA Hgb 8.8 on admission Check Iron panel --> confirms iron deficiency --> IV iron prior to discharge  He last received Feraheme on November 07, 2021.  Follows with oncology Monitor CBC             Discharge Instructions  Allergies as of 02/14/2022       Reactions   Nsaids    Can take Ibuprofen in moderation/high doses or prolonged use causes GI bleed        Medication List     STOP taking these medications    dilTIAZem HCl ER 300 MG Tb24 Replaced by: diltiazem 30 MG tablet   metoprolol succinate 50 MG 24 hr tablet Commonly known as: TOPROL-XL       TAKE these medications    acetaminophen 650 MG CR tablet Commonly known as: TYLENOL Take 650 mg by mouth every 8 (eight) hours as needed for pain.   cetirizine 10 MG tablet Commonly known as: ZYRTEC Take 10 mg by mouth every morning.   clindamycin 1 % external  solution Commonly known as: CLEOCIN T Apply 1 % topically as needed. To head   diltiazem 30 MG tablet Commonly known as: CARDIZEM Take 1 tablet (30 mg total) by mouth every 12 (twelve) hours. Replaces: dilTIAZem HCl ER 300 MG Tb24   fluticasone 50 MCG/ACT nasal spray Commonly known as: FLONASE Place 1 spray into both nostrils daily. Start taking on: February 15, 2022   furosemide 20 MG tablet Commonly known as: LASIX Take 40 mg by mouth every morning.   HYDROcodone-acetaminophen 5-325 MG tablet Commonly known as: NORCO/VICODIN Take 1-2 tablets by mouth every 6 (six) hours as needed for moderate pain or severe pain.   pantoprazole 40 MG tablet Commonly known as: PROTONIX Take 1 tablet (40 mg total) by mouth daily. Start taking on: February 15, 2022   sodium chloride 0.65 % Soln nasal spray Commonly known as: OCEAN Place 1 spray into both nostrils as needed for congestion.   tamsulosin 0.4 MG Caps capsule Commonly known as: FLOMAX  Take 1 capsule (0.4 mg total) by mouth daily. Start taking on: February 15, 2022   timolol 0.5 % ophthalmic solution Commonly known as: TIMOPTIC Place 1 drop into both eyes daily.               Discharge Care Instructions  (From admission, onward)           Start     Ordered   02/14/22 0000  Discharge wound care:       Comments: KEEP WOUND CLEAN/DRY AS DIRECTED BY ORTHOPEDICS KEEP ORTHOPEDICS APPOINTMENTS AS DIRECTED   02/14/22 1148              Allergies  Allergen Reactions   Nsaids     Can take Ibuprofen in moderation/high doses or prolonged use causes GI bleed     Subjective: Pt reports feeling well today, has questions about home PT but otherwise no concerns. Denies CP/SOB   Discharge Exam: BP 119/72 (BP Location: Right Arm)   Pulse 91   Temp 97.6 F (36.4 C)   Resp 17   Ht '5\' 6"'$  (1.676 m)   Wt 106.4 kg   SpO2 92%   BMI 37.86 kg/m  General: Pt is alert, awake, not in acute distress Cardiovascular:  RRR, S1/S2, no rubs, no gallops Respiratory: CTA bilaterally, no wheezing, no rhonchi Abdominal: Soft, NT, ND, bowel sounds  Extremities: no edema, no cyanosis     The results of significant diagnostics from this hospitalization (including imaging, microbiology, ancillary and laboratory) are listed below for reference.     Microbiology: Recent Results (from the past 240 hour(s))  Surgical pcr screen     Status: None   Collection Time: 02/07/22 12:18 PM   Specimen: Nasal Mucosa; Nasal Swab  Result Value Ref Range Status   MRSA, PCR NEGATIVE NEGATIVE Final   Staphylococcus aureus NEGATIVE NEGATIVE Final    Comment: (NOTE) The Xpert SA Assay (FDA approved for NASAL specimens in patients 92 years of age and older), is one component of a comprehensive surveillance program. It is not intended to diagnose infection nor to guide or monitor treatment. Performed at [facility], [address] [place], [place] [address]   Blood culture (routine x 2)     Status: None (Preliminary result)   Collection Time: 02/10/22  4:37 PM   Specimen: BLOOD  Result Value Ref Range Status   Specimen Description BLOOD BLOOD RIGHT HAND  Final   Special Requests   Final    BOTTLES DRAWN AEROBIC AND ANAEROBIC Blood Culture results may not be optimal due to an inadequate volume of blood received in culture bottles   Culture   Final    NO GROWTH 4 DAYS Performed at [facility], [address] [place], [place] [address]    Report Status PENDING  Incomplete  SARS Coronavirus 2 by RT PCR (hospital order, performed in [place] hospital lab) *cepheid single result test* Anterior Nasal Swab     Status: None   Collection Time: 02/10/22  4:37 PM   Specimen: Anterior Nasal Swab  Result Value Ref Range Status   SARS Coronavirus 2 by RT PCR NEGATIVE NEGATIVE Final    Comment: (NOTE) SARS-CoV-2 target nucleic acids are NOT DETECTED.  The SARS-CoV-2 RNA is generally detectable in upper and  lower respiratory specimens during the acute phase of infection. The lowest concentration of SARS-CoV-2 viral copies this assay can detect is 250 copies / mL. A negative result does not preclude SARS-CoV-2 infection and  should not be used as the sole basis for treatment or other patient management decisions.  A negative result may occur with improper specimen collection / handling, submission of specimen other than nasopharyngeal swab, presence of viral mutation(s) within the areas targeted by this assay, and inadequate number of viral copies (<250 copies / mL). A negative result must be combined with clinical observations, patient history, and epidemiological information.  Fact Sheet for Patients:   https://www.patel.info/  Fact Sheet for Healthcare Providers: https://hall.com/  This test is not yet approved or  cleared by the [place] FDA and has been authorized for detection and/or diagnosis of SARS-CoV-2 by FDA under an Emergency Use Authorization (EUA).  This EUA will remain in effect (meaning this test can be used) for the duration of the COVID-19 declaration under Section 564(b)(1) of the Act, 21 U.S.C. section 360bbb-3(b)(1), unless the authorization is terminated or revoked sooner.  Performed at [facility], [address] [place], [place] [address]   MRSA Next Gen by PCR, Nasal     Status: None   Collection Time: 02/10/22  9:07 PM   Specimen: Nasal Mucosa; Nasal Swab  Result Value Ref Range Status   MRSA by PCR Next Gen NOT DETECTED NOT DETECTED Final    Comment: (NOTE) The GeneXpert MRSA Assay (FDA approved for NASAL specimens only), is one component of a comprehensive MRSA colonization surveillance program. It is not intended to diagnose MRSA infection nor to guide or monitor treatment for MRSA infections. Test performance is not FDA approved in patients less than 84 years old. Performed at [facility], [address] [place], [place] [address]   Blood culture (routine x 2)     Status: None (Preliminary result)   Collection Time: 02/11/22  4:50 AM   Specimen: BLOOD RIGHT HAND  Result Value Ref Range Status   Specimen Description BLOOD RIGHT HAND  Final   Special Requests Blood Culture adequate volume  Final   Culture   Final    NO GROWTH 3 DAYS Performed at [facility], [address] [place], [place] [address]    Report Status PENDING  Incomplete     Labs: BNP (last 3 results) Recent Labs    02/10/22 1958 02/11/22 1343  BNP 484.0* 237.6*   Basic Metabolic Panel: Recent Labs  Lab 02/10/22 1543 02/11/22 0450 02/11/22 0725 02/12/22 0341 02/13/22 0346 02/14/22 0420  NA 140 140  --  139 138 140  K 5.1 4.6  --  4.1 3.9 4.4  CL 106 109  --  108 106 104  CO2 24 25  --  26 27 32  GLUCOSE 154* 107*  --  111* 139* 112*  BUN 21 25*  --  24* 19 13  CREATININE 0.92 0.78  --  0.79 0.60* 0.60*  CALCIUM 8.8* 8.4*  --  8.1* 8.0* 8.2*  MG  --   --  1.9 1.8 2.0 2.0  PHOS  --   --  3.4 2.7 1.8* 3.1   Liver Function Tests: Recent Labs  Lab 02/10/22 1543 02/11/22 0450 02/12/22 0341 02/13/22 0346 02/14/22 0420  AST 31 48* 55* 57* 44*  ALT 10 30 49* 60* 59*  ALKPHOS 71 64 62 62 71  BILITOT 1.1 0.8 1.0 0.8 1.0  PROT 6.4* 5.9* 5.6* 5.5* 5.6*  ALBUMIN 3.3* 3.3* 2.9* 2.7* 2.7*   No results for input(s): "LIPASE", "AMYLASE" in the last 168 hours. No results for input(s): "AMMONIA" in the last 168  hours. CBC: Recent Labs  Lab 02/10/22 1543 02/11/22 0450 02/12/22 0341 02/13/22 0346 02/14/22 0420  WBC 13.6* 10.7* 7.4 5.3 5.0  NEUTROABS  --  8.2* 4.9 3.4 2.9  HGB 8.8* 8.6* 7.9* 7.4* 7.7*  HCT 30.2* 29.0* 25.9* 23.7* 25.2*  MCV 89.9 88.4 86.3 84.0 84.8  PLT 295 274 205 190 215   Cardiac Enzymes: No results for input(s): "CKTOTAL", "CKMB", "CKMBINDEX", "TROPONINI" in the last 168 hours. BNP: Invalid input(s): "POCBNP" CBG: Recent Labs  Lab  02/10/22 1538 02/10/22 1907 02/14/22 0906  GLUCAP 143* 118* 98   D-Dimer No results for input(s): "DDIMER" in the last 72 hours. Hgb A1c No results for input(s): "HGBA1C" in the last 72 hours. Lipid Profile No results for input(s): "CHOL", "HDL", "LDLCALC", "TRIG", "CHOLHDL", "LDLDIRECT" in the last 72 hours. Thyroid function studies No results for input(s): "TSH", "T4TOTAL", "T3FREE", "THYROIDAB" in the last 72 hours.  Invalid input(s): "FREET3" Anemia work up No results for input(s): "VITAMINB12", "FOLATE", "FERRITIN", "TIBC", "IRON", "RETICCTPCT" in the last 72 hours. Urinalysis    Component Value Date/Time   COLORURINE AMBER (A) 02/10/2022 1637   APPEARANCEUR CLOUDY (A) 02/10/2022 1637   APPEARANCEUR Clear 01/13/2022 0905   LABSPEC 1.023 02/10/2022 1637   PHURINE 5.0 02/10/2022 1637   GLUCOSEU NEGATIVE 02/10/2022 1637   HGBUR SMALL (A) 02/10/2022 1637   BILIRUBINUR NEGATIVE 02/10/2022 1637   BILIRUBINUR Negative 01/13/2022 0905   KETONESUR 5 (A) 02/10/2022 1637   PROTEINUR >=300 (A) 02/10/2022 1637   NITRITE NEGATIVE 02/10/2022 1637   LEUKOCYTESUR NEGATIVE 02/10/2022 1637   Sepsis Labs Recent Labs  Lab 02/11/22 0450 02/12/22 0341 02/13/22 0346 02/14/22 0420  WBC 10.7* 7.4 5.3 5.0   Microbiology Recent Results (from the past 240 hour(s))  Surgical pcr screen     Status: None   Collection Time: 02/07/22 12:18 PM   Specimen: Nasal Mucosa; Nasal Swab  Result Value Ref Range Status   MRSA, PCR NEGATIVE NEGATIVE Final   Staphylococcus aureus NEGATIVE NEGATIVE Final    Comment: (NOTE) The Xpert SA Assay (FDA approved for NASAL specimens in patients 70 years of age and older), is one component of a comprehensive surveillance program. It is not intended to diagnose infection nor to guide or monitor treatment. Performed at [facility], [address] [place], [place] [address]   Blood culture (routine x 2)     Status: None (Preliminary result)    Collection Time: 02/10/22  4:37 PM   Specimen: BLOOD  Result Value Ref Range Status   Specimen Description BLOOD BLOOD RIGHT HAND  Final   Special Requests   Final    BOTTLES DRAWN AEROBIC AND ANAEROBIC Blood Culture results may not be optimal due to an inadequate volume of blood received in culture bottles   Culture   Final    NO GROWTH 4 DAYS Performed at [facility], [address] [place], [place] [address]    Report Status PENDING  Incomplete  SARS Coronavirus 2 by RT PCR (hospital order, performed in [place] hospital lab) *cepheid single result test* Anterior Nasal Swab     Status: None   Collection Time: 02/10/22  4:37 PM   Specimen: Anterior Nasal Swab  Result Value Ref Range Status   SARS Coronavirus 2 by RT PCR NEGATIVE NEGATIVE Final    Comment: (NOTE) SARS-CoV-2 target nucleic acids are NOT DETECTED.  The SARS-CoV-2 RNA is generally detectable in upper and lower respiratory specimens during the acute phase of infection. The  lowest concentration of SARS-CoV-2 viral copies this assay can detect is 250 copies / mL. A negative result does not preclude SARS-CoV-2 infection and should not be used as the sole basis for treatment or other patient management decisions.  A negative result may occur with improper specimen collection / handling, submission of specimen other than nasopharyngeal swab, presence of viral mutation(s) within the areas targeted by this assay, and inadequate number of viral copies (<250 copies / mL). A negative result must be combined with clinical observations, patient history, and epidemiological information.  Fact Sheet for Patients:   https://www.patel.info/  Fact Sheet for Healthcare Providers: https://hall.com/  This test is not yet approved or  cleared by the [place] FDA and has been authorized for detection and/or diagnosis of SARS-CoV-2 by FDA under an Emergency Use Authorization  (EUA).  This EUA will remain in effect (meaning this test can be used) for the duration of the COVID-19 declaration under Section 564(b)(1) of the Act, 21 U.S.C. section 360bbb-3(b)(1), unless the authorization is terminated or revoked sooner.  Performed at [facility], [address] [place], [place] [address]   MRSA Next Gen by PCR, Nasal     Status: None   Collection Time: 02/10/22  9:07 PM   Specimen: Nasal Mucosa; Nasal Swab  Result Value Ref Range Status   MRSA by PCR Next Gen NOT DETECTED NOT DETECTED Final    Comment: (NOTE) The GeneXpert MRSA Assay (FDA approved for NASAL specimens only), is one component of a comprehensive MRSA colonization surveillance program. It is not intended to diagnose MRSA infection nor to guide or monitor treatment for MRSA infections. Test performance is not FDA approved in patients less than 81 years old. Performed at [facility], [address] [place], [place] [address]   Blood culture (routine x 2)     Status: None (Preliminary result)   Collection Time: 02/11/22  4:50 AM   Specimen: BLOOD RIGHT HAND  Result Value Ref Range Status   Specimen Description BLOOD RIGHT HAND  Final   Special Requests Blood Culture adequate volume  Final   Culture   Final    NO GROWTH 3 DAYS Performed at [facility], [address], [place], [place] [address]    Report Status PENDING  Incomplete   Imaging DG Chest Port 1 View  Result Date: 02/11/2022 CLINICAL DATA:  Shortness of breath.  Sepsis. EXAM: PORTABLE CHEST 1 VIEW COMPARISON:  02/10/2022 FINDINGS: Low lung volumes are again noted. Small left pleural effusion is increased in size. Left lower lobe atelectasis or infiltrate also shows mild increase. Right lung remains clear. Left shoulder prosthesis and overlying skin staples again noted. IMPRESSION: Increased small left pleural effusion and left lower lobe atelectasis or infiltrate. Electronically Signed   By: Marlaine Hind M.D.   On: 02/11/2022 13:08   ECHOCARDIOGRAM COMPLETE  Result Date: 02/11/2022    ECHOCARDIOGRAM REPORT   Patient Name:   BABAK LUCUS Date of Exam: 02/11/2022 Medical Rec #:  [id number]      Height:       66.0 in Accession #:    [id number]     Weight:       215.2 lb Date of Birth:  1940/10/15       BSA:          2.063 m Patient Age:    14 years       BP:           104/91  mmHg Patient Gender: M              HR:           91 bpm. Exam Location:  [facility] Procedure: 2D Echo Indications:     Syncope R55  History:         Patient has no prior history of Echocardiogram examinations.  Sonographer:     Kathlen Brunswick RDCS Referring Phys:  [place] Diagnosing Phys: Yolonda Kida MD  Sonographer Comments: Technically challenging study due to limited acoustic windows, suboptimal apical window and no subcostal window. Very challenging study due to patient's recent left shoulder surgery, unable to remove sling to acquire better images. IMPRESSIONS  1. Left ventricular ejection fraction, by estimation, is 50 to 55%. The left ventricle has low normal function. The left ventricle demonstrates global hypokinesis. The left ventricular internal cavity size was moderately dilated. Left ventricular diastolic function could not be evaluated. There is the interventricular septum is flattened in diastole ('D' shaped left ventricle), consistent with right ventricular volume overload.  2. Right ventricular systolic function is moderately reduced. The right ventricular size is moderately enlarged. Mildly increased right ventricular wall thickness.  3. Left atrial size was mildly dilated.  4. Right atrial size was moderately dilated.  5. The mitral valve is myxomatous. Mild to moderate mitral valve regurgitation.  6. Tricuspid valve regurgitation is moderate.  7. The aortic valve is normal in structure. Aortic valve regurgitation is not visualized. FINDINGS  Left Ventricle: Left ventricular ejection fraction,  by estimation, is 50 to 55%. The left ventricle has low normal function. The left ventricle demonstrates global hypokinesis. The left ventricular internal cavity size was moderately dilated. There is no left ventricular hypertrophy. The interventricular septum is flattened in diastole ('D' shaped left ventricle), consistent with right ventricular volume overload. Left ventricular diastolic function could not be evaluated. Right Ventricle: The right ventricular size is moderately enlarged. Mildly increased right ventricular wall thickness. Right ventricular systolic function is moderately reduced. Left Atrium: Left atrial size was mildly dilated. Right Atrium: Right atrial size was moderately dilated. Pericardium: There is no evidence of pericardial effusion. Mitral Valve: The mitral valve is myxomatous. Mild to moderate mitral valve regurgitation. Tricuspid Valve: The tricuspid valve is grossly normal. Tricuspid valve regurgitation is moderate. Aortic Valve: The aortic valve is normal in structure. Aortic valve regurgitation is not visualized. Pulmonic Valve: The pulmonic valve was grossly normal. Pulmonic valve regurgitation is trivial. Aorta: The ascending aorta was not well visualized. IAS/Shunts: No atrial level shunt detected by color flow Doppler.  LEFT VENTRICLE PLAX 2D LVIDd:         5.20 cm   Diastology LVIDs:         3.70 cm   LV e' medial:    12.20 cm/s LV PW:         1.00 cm   LV E/e' medial:  8.3 LV IVS:        1.20 cm   LV e' lateral:   12.60 cm/s LVOT diam:     1.80 cm   LV E/e' lateral: 8.0 LV SV:         35 LV SV Index:   17 LVOT Area:     2.54 cm  RIGHT VENTRICLE RV Basal diam:  5.10 cm TAPSE (M-mode): 1.6 cm LEFT ATRIUM         Index       RIGHT ATRIUM  Index LA diam:    4.60 cm 2.23 cm/m  RA Area:     36.10 cm                                 RA Volume:   154.00 ml 74.65 ml/m  AORTIC VALVE LVOT Vmax:   80.60 cm/s LVOT Vmean:  48.300 cm/s LVOT VTI:    0.136 m  AORTA Ao Root diam: 3.00  cm MITRAL VALVE                TRICUSPID VALVE MV Area (PHT): 3.79 cm     TR Peak grad:   53.9 mmHg MV Decel Time: 200 msec     TR Vmax:        367.00 cm/s MV E velocity: 101.00 cm/s                             SHUNTS                             Systemic VTI:  0.14 m                             Systemic Diam: 1.80 cm Yolonda Kida MD Electronically signed by Yolonda Kida MD Signature Date/Time: 02/11/2022/1:04:32 PM    Final       Time coordinating discharge: OVER 30 minutes  SIGNED:  Emeterio Reeve DO [facility]

## 2022-02-15 DIAGNOSIS — N39498 Other specified urinary incontinence: Secondary | ICD-10-CM | POA: Diagnosis not present

## 2022-02-15 DIAGNOSIS — Z79899 Other long term (current) drug therapy: Secondary | ICD-10-CM | POA: Diagnosis not present

## 2022-02-15 DIAGNOSIS — E785 Hyperlipidemia, unspecified: Secondary | ICD-10-CM | POA: Diagnosis not present

## 2022-02-15 DIAGNOSIS — A403 Sepsis due to Streptococcus pneumoniae: Secondary | ICD-10-CM | POA: Diagnosis not present

## 2022-02-15 DIAGNOSIS — K529 Noninfective gastroenteritis and colitis, unspecified: Secondary | ICD-10-CM | POA: Diagnosis not present

## 2022-02-15 DIAGNOSIS — Z87891 Personal history of nicotine dependence: Secondary | ICD-10-CM | POA: Diagnosis not present

## 2022-02-15 DIAGNOSIS — Z85828 Personal history of other malignant neoplasm of skin: Secondary | ICD-10-CM | POA: Diagnosis not present

## 2022-02-15 DIAGNOSIS — D500 Iron deficiency anemia secondary to blood loss (chronic): Secondary | ICD-10-CM | POA: Diagnosis not present

## 2022-02-15 DIAGNOSIS — J309 Allergic rhinitis, unspecified: Secondary | ICD-10-CM | POA: Diagnosis not present

## 2022-02-15 DIAGNOSIS — D649 Anemia, unspecified: Secondary | ICD-10-CM | POA: Diagnosis not present

## 2022-02-15 DIAGNOSIS — Z96612 Presence of left artificial shoulder joint: Secondary | ICD-10-CM | POA: Diagnosis not present

## 2022-02-15 DIAGNOSIS — I4891 Unspecified atrial fibrillation: Secondary | ICD-10-CM | POA: Diagnosis not present

## 2022-02-15 DIAGNOSIS — Z428 Encounter for other plastic and reconstructive surgery following medical procedure or healed injury: Secondary | ICD-10-CM | POA: Diagnosis not present

## 2022-02-15 DIAGNOSIS — Z9181 History of falling: Secondary | ICD-10-CM | POA: Diagnosis not present

## 2022-02-15 DIAGNOSIS — I10 Essential (primary) hypertension: Secondary | ICD-10-CM | POA: Diagnosis not present

## 2022-02-15 DIAGNOSIS — M519 Unspecified thoracic, thoracolumbar and lumbosacral intervertebral disc disorder: Secondary | ICD-10-CM | POA: Diagnosis not present

## 2022-02-15 DIAGNOSIS — I2510 Atherosclerotic heart disease of native coronary artery without angina pectoris: Secondary | ICD-10-CM | POA: Diagnosis not present

## 2022-02-15 DIAGNOSIS — G8929 Other chronic pain: Secondary | ICD-10-CM | POA: Diagnosis not present

## 2022-02-15 DIAGNOSIS — Z86718 Personal history of other venous thrombosis and embolism: Secondary | ICD-10-CM | POA: Diagnosis not present

## 2022-02-15 DIAGNOSIS — J9601 Acute respiratory failure with hypoxia: Secondary | ICD-10-CM | POA: Diagnosis not present

## 2022-02-15 DIAGNOSIS — M75122 Complete rotator cuff tear or rupture of left shoulder, not specified as traumatic: Secondary | ICD-10-CM | POA: Diagnosis not present

## 2022-02-15 DIAGNOSIS — Z86010 Personal history of colonic polyps: Secondary | ICD-10-CM | POA: Diagnosis not present

## 2022-02-15 DIAGNOSIS — M545 Low back pain, unspecified: Secondary | ICD-10-CM | POA: Diagnosis not present

## 2022-02-15 DIAGNOSIS — N401 Enlarged prostate with lower urinary tract symptoms: Secondary | ICD-10-CM | POA: Diagnosis not present

## 2022-02-15 LAB — CULTURE, BLOOD (ROUTINE X 2): Culture: NO GROWTH

## 2022-02-16 LAB — CULTURE, BLOOD (ROUTINE X 2)
Culture: NO GROWTH
Special Requests: ADEQUATE

## 2022-02-17 LAB — BLOOD GAS, VENOUS
Acid-base deficit: 0.4 mmol/L (ref 0.0–2.0)
Bicarbonate: 26.7 mmol/L (ref 20.0–28.0)
O2 Saturation: 18.3 %
Patient temperature: 37
pCO2, Ven: 53 mmHg (ref 44–60)
pH, Ven: 7.31 (ref 7.25–7.43)

## 2022-02-22 DIAGNOSIS — Z428 Encounter for other plastic and reconstructive surgery following medical procedure or healed injury: Secondary | ICD-10-CM | POA: Diagnosis not present

## 2022-02-22 DIAGNOSIS — D500 Iron deficiency anemia secondary to blood loss (chronic): Secondary | ICD-10-CM | POA: Diagnosis not present

## 2022-02-22 DIAGNOSIS — N401 Enlarged prostate with lower urinary tract symptoms: Secondary | ICD-10-CM | POA: Diagnosis not present

## 2022-02-22 DIAGNOSIS — G8929 Other chronic pain: Secondary | ICD-10-CM | POA: Diagnosis not present

## 2022-02-22 DIAGNOSIS — Z96612 Presence of left artificial shoulder joint: Secondary | ICD-10-CM | POA: Diagnosis not present

## 2022-02-22 DIAGNOSIS — I2510 Atherosclerotic heart disease of native coronary artery without angina pectoris: Secondary | ICD-10-CM | POA: Diagnosis not present

## 2022-02-22 DIAGNOSIS — J9601 Acute respiratory failure with hypoxia: Secondary | ICD-10-CM | POA: Diagnosis not present

## 2022-02-22 DIAGNOSIS — J309 Allergic rhinitis, unspecified: Secondary | ICD-10-CM | POA: Diagnosis not present

## 2022-02-22 DIAGNOSIS — I10 Essential (primary) hypertension: Secondary | ICD-10-CM | POA: Diagnosis not present

## 2022-02-22 DIAGNOSIS — K529 Noninfective gastroenteritis and colitis, unspecified: Secondary | ICD-10-CM | POA: Diagnosis not present

## 2022-02-22 DIAGNOSIS — Z87891 Personal history of nicotine dependence: Secondary | ICD-10-CM | POA: Diagnosis not present

## 2022-02-22 DIAGNOSIS — Z9181 History of falling: Secondary | ICD-10-CM | POA: Diagnosis not present

## 2022-02-22 DIAGNOSIS — Z79899 Other long term (current) drug therapy: Secondary | ICD-10-CM | POA: Diagnosis not present

## 2022-02-22 DIAGNOSIS — D649 Anemia, unspecified: Secondary | ICD-10-CM | POA: Diagnosis not present

## 2022-02-22 DIAGNOSIS — I4891 Unspecified atrial fibrillation: Secondary | ICD-10-CM | POA: Diagnosis not present

## 2022-02-22 DIAGNOSIS — A403 Sepsis due to Streptococcus pneumoniae: Secondary | ICD-10-CM | POA: Diagnosis not present

## 2022-02-22 DIAGNOSIS — Z86010 Personal history of colonic polyps: Secondary | ICD-10-CM | POA: Diagnosis not present

## 2022-02-22 DIAGNOSIS — E785 Hyperlipidemia, unspecified: Secondary | ICD-10-CM | POA: Diagnosis not present

## 2022-02-22 DIAGNOSIS — M75122 Complete rotator cuff tear or rupture of left shoulder, not specified as traumatic: Secondary | ICD-10-CM | POA: Diagnosis not present

## 2022-02-22 DIAGNOSIS — Z85828 Personal history of other malignant neoplasm of skin: Secondary | ICD-10-CM | POA: Diagnosis not present

## 2022-02-22 DIAGNOSIS — M519 Unspecified thoracic, thoracolumbar and lumbosacral intervertebral disc disorder: Secondary | ICD-10-CM | POA: Diagnosis not present

## 2022-02-22 DIAGNOSIS — N39498 Other specified urinary incontinence: Secondary | ICD-10-CM | POA: Diagnosis not present

## 2022-02-22 DIAGNOSIS — Z86718 Personal history of other venous thrombosis and embolism: Secondary | ICD-10-CM | POA: Diagnosis not present

## 2022-02-22 DIAGNOSIS — M545 Low back pain, unspecified: Secondary | ICD-10-CM | POA: Diagnosis not present

## 2022-02-23 DIAGNOSIS — R030 Elevated blood-pressure reading, without diagnosis of hypertension: Secondary | ICD-10-CM | POA: Diagnosis not present

## 2022-02-23 DIAGNOSIS — K449 Diaphragmatic hernia without obstruction or gangrene: Secondary | ICD-10-CM | POA: Diagnosis not present

## 2022-02-23 DIAGNOSIS — E7800 Pure hypercholesterolemia, unspecified: Secondary | ICD-10-CM | POA: Diagnosis not present

## 2022-02-23 DIAGNOSIS — I482 Chronic atrial fibrillation, unspecified: Secondary | ICD-10-CM | POA: Diagnosis not present

## 2022-02-23 DIAGNOSIS — I4819 Other persistent atrial fibrillation: Secondary | ICD-10-CM | POA: Diagnosis not present

## 2022-02-23 DIAGNOSIS — D509 Iron deficiency anemia, unspecified: Secondary | ICD-10-CM | POA: Diagnosis not present

## 2022-02-23 DIAGNOSIS — I10 Essential (primary) hypertension: Secondary | ICD-10-CM | POA: Diagnosis not present

## 2022-02-27 ENCOUNTER — Encounter: Payer: Self-pay | Admitting: Nurse Practitioner

## 2022-02-27 ENCOUNTER — Inpatient Hospital Stay: Payer: PPO

## 2022-02-27 ENCOUNTER — Inpatient Hospital Stay: Payer: PPO | Attending: Oncology | Admitting: Nurse Practitioner

## 2022-02-27 VITALS — BP 106/67 | HR 78 | Temp 97.6°F | Resp 18 | Wt 190.0 lb

## 2022-02-27 DIAGNOSIS — Z77098 Contact with and (suspected) exposure to other hazardous, chiefly nonmedicinal, chemicals: Secondary | ICD-10-CM | POA: Insufficient documentation

## 2022-02-27 DIAGNOSIS — D509 Iron deficiency anemia, unspecified: Secondary | ICD-10-CM | POA: Insufficient documentation

## 2022-02-27 DIAGNOSIS — E538 Deficiency of other specified B group vitamins: Secondary | ICD-10-CM | POA: Insufficient documentation

## 2022-02-27 DIAGNOSIS — Z79899 Other long term (current) drug therapy: Secondary | ICD-10-CM | POA: Insufficient documentation

## 2022-02-27 DIAGNOSIS — I4891 Unspecified atrial fibrillation: Secondary | ICD-10-CM | POA: Insufficient documentation

## 2022-02-27 DIAGNOSIS — N529 Male erectile dysfunction, unspecified: Secondary | ICD-10-CM | POA: Insufficient documentation

## 2022-02-27 DIAGNOSIS — I4819 Other persistent atrial fibrillation: Secondary | ICD-10-CM | POA: Insufficient documentation

## 2022-02-27 LAB — CBC WITH DIFFERENTIAL/PLATELET
Abs Immature Granulocytes: 0.02 10*3/uL (ref 0.00–0.07)
Basophils Absolute: 0 10*3/uL (ref 0.0–0.1)
Basophils Relative: 1 %
Eosinophils Absolute: 0.2 10*3/uL (ref 0.0–0.5)
Eosinophils Relative: 3 %
HCT: 33.1 % — ABNORMAL LOW (ref 39.0–52.0)
Hemoglobin: 10.1 g/dL — ABNORMAL LOW (ref 13.0–17.0)
Immature Granulocytes: 0 %
Lymphocytes Relative: 16 %
Lymphs Abs: 1.1 10*3/uL (ref 0.7–4.0)
MCH: 26.1 pg (ref 26.0–34.0)
MCHC: 30.5 g/dL (ref 30.0–36.0)
MCV: 85.5 fL (ref 80.0–100.0)
Monocytes Absolute: 0.8 10*3/uL (ref 0.1–1.0)
Monocytes Relative: 13 %
Neutro Abs: 4.4 10*3/uL (ref 1.7–7.7)
Neutrophils Relative %: 67 %
Platelets: 415 10*3/uL — ABNORMAL HIGH (ref 150–400)
RBC: 3.87 MIL/uL — ABNORMAL LOW (ref 4.22–5.81)
RDW: 19.9 % — ABNORMAL HIGH (ref 11.5–15.5)
WBC: 6.6 10*3/uL (ref 4.0–10.5)
nRBC: 0 % (ref 0.0–0.2)

## 2022-02-27 LAB — COMPREHENSIVE METABOLIC PANEL
ALT: 13 U/L (ref 0–44)
AST: 20 U/L (ref 15–41)
Albumin: 3.4 g/dL — ABNORMAL LOW (ref 3.5–5.0)
Alkaline Phosphatase: 100 U/L (ref 38–126)
Anion gap: 10 (ref 5–15)
BUN: 24 mg/dL — ABNORMAL HIGH (ref 8–23)
CO2: 29 mmol/L (ref 22–32)
Calcium: 8.8 mg/dL — ABNORMAL LOW (ref 8.9–10.3)
Chloride: 100 mmol/L (ref 98–111)
Creatinine, Ser: 0.66 mg/dL (ref 0.61–1.24)
GFR, Estimated: >60 mL/min (ref >60)
Glucose, Bld: 79 mg/dL (ref 70–99)
Potassium: 3.6 mmol/L (ref 3.5–5.1)
Sodium: 139 mmol/L (ref 135–145)
Total Bilirubin: 0.6 mg/dL (ref 0.3–1.2)
Total Protein: 7.1 g/dL (ref 6.5–8.1)

## 2022-02-27 LAB — IRON AND TIBC
Iron: 49 ug/dL (ref 45–182)
Saturation Ratios: 11 % — ABNORMAL LOW (ref 17.9–39.5)
TIBC: 451 ug/dL — ABNORMAL HIGH (ref 250–450)
UIBC: 402 ug/dL

## 2022-02-27 LAB — FERRITIN: Ferritin: 70 ng/mL (ref 24–336)

## 2022-02-27 LAB — VITAMIN B12: Vitamin B-12: 306 pg/mL (ref 180–914)

## 2022-02-27 NOTE — Progress Notes (Signed)
[place]  [phone]) [phone] [phone]) [phone]  ID: Marcus Coleman OB: 1940-11-28  MR#: [id number]  AOZ#:[id number]  Patient Care Team: Idelle Crouch, MD as PCP - General (Internal Medicine) Lloyd Huger, MD as Consulting Physician (Hematology and Oncology)  CHIEF COMPLAINT: Iron deficiency anemia.  INTERVAL HISTORY: Patient returns to clinic today for repeat laboratory work, further evaluation, and consideration of additional IV Feraheme.    In interim, patient underwent surgery on left shoulder then was found to be without a pulse, unresponsive, and hypoxic. He was transported via EMS to ER for apparent cardiac arrest. He was found to have LLL pneumonia. He did not require vasopressors and became more responsive in ER. Was admitted to ICU. Persistent bradycardia w/ brief episode of unresponsiveness that responded spontaneously thought to be secondary to postural hypotension. 12/4 - rapid afib that responded to IV cardizem.  Was discharged on 12/5 after receiving iron infusion with hemoglobin 7.7.   Since discharge he reports continuing to recover from shoulder surgery. Denies pain. Denies feeling ill prior to surgery. No cough or congestion. Not on antibiotics.   REVIEW OF SYSTEMS:   Review of Systems  Constitutional:  Positive for malaise/fatigue. Negative for fever and weight loss.  Respiratory: Negative.  Negative for cough, hemoptysis and shortness of breath.   Cardiovascular: Negative.  Negative for chest pain and leg swelling.  Gastrointestinal: Negative.  Negative for abdominal pain, blood in stool and melena.  Genitourinary: Negative.  Negative for hematuria.  Musculoskeletal: Negative.  Negative for back pain.  Skin: Negative.  Negative for rash.  Neurological:  Positive for weakness. Negative for dizziness, focal weakness and headaches.  Psychiatric/Behavioral: Negative.  The patient is not nervous/anxious.   As per HPI. Otherwise, a  complete review of systems is negative.  PAST MEDICAL HISTORY: Past Medical History:  Diagnosis Date   Aortic atherosclerosis ([place])    Atrial fibrillation ([place])    a.) CHA2DS2VASc = 6 (age x2, HTN, DVT x2, vascular disease history);  b.) rate/rhythm maintained on oral diltiazem + metoprolol succinate; chronic anticoagulation (apixaban) discontinued due to GI bleeding   BPH with obstruction/lower urinary tract symptoms    Bronchitis 01/27/2022   CAD (coronary artery disease)    Cardiomegaly    Chronic diarrhea    Compression fracture of thoracic vertebra ([place])    a.) CT renal 12/30/2021: chronic appearing T11-T12 (70% height loss)   DDD (degenerative disc disease), cervical    Diverticulosis    DVT (deep venous thrombosis) ([place]) 03/31/2019   a.) anticoagulation intiated (apixaban), however subsequently discontinued due to GI bleeding   ETOH abuse    GI bleed    Heart murmur    Hiatal hernia    Hyperlipidemia    Hypertension    IDA (iron deficiency anemia)    Low testosterone    Nephrolithiasis    Pneumonia    Rotator cuff arthropathy, left    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    Vasovagal syncope     PAST SURGICAL HISTORY: Past Surgical History:  Procedure Laterality Date   boils removed from right forearm  [place] EXTRACTION Left 2008   catracts Bilateral    left 2008, right 2013   CHOLECYSTECTOMY     COLONOSCOPY WITH PROPOFOL N/A 06/26/2016   Procedure: COLONOSCOPY WITH PROPOFOL;  Surgeon: Manya Silvas, MD;  Location: [facility] ENDOSCOPY;  Service: Endoscopy;  Laterality: N/A;   COLONOSCOPY WITH PROPOFOL N/A  07/03/2019   Procedure: COLONOSCOPY WITH PROPOFOL;  Surgeon: [name], MD;  Location: [facility] ENDOSCOPY;  Service: Gastroenterology;  Laterality: N/A;   ENDOSCOPIC RETROGRADE CHOLANGIOPANCREATOGRAPHY (ERCP) WITH PROPOFOL     ENDOSCOPIC VEIN LASER TREATMENT     right leg 05/2015, left leg 09/2015   ESOPHAGOGASTRODUODENOSCOPY  N/A 04/19/2019   Procedure: ESOPHAGOGASTRODUODENOSCOPY (EGD);  Surgeon: [name], MD;  Location: [facility] ENDOSCOPY;  Service: Gastroenterology;  Laterality: N/A;   ESOPHAGOGASTRODUODENOSCOPY (EGD) WITH PROPOFOL N/A 07/03/2019   Procedure: ESOPHAGOGASTRODUODENOSCOPY (EGD) WITH PROPOFOL;  Surgeon: [name], MD;  Location: [facility] ENDOSCOPY;  Service: Gastroenterology;  Laterality: N/A;   ESOPHAGOGASTRODUODENOSCOPY (EGD) WITH PROPOFOL N/A 08/07/2019   Procedure: ESOPHAGOGASTRODUODENOSCOPY (EGD) WITH PROPOFOL;  Surgeon: Milus Banister, MD;  Location: [facility] ENDOSCOPY;  Service: Endoscopy;  Laterality: N/A;   EUS N/A 08/07/2019   Procedure: UPPER ENDOSCOPIC ULTRASOUND (EUS) RADIAL;  Surgeon: Milus Banister, MD;  Location: [facility] ENDOSCOPY;  Service: Endoscopy;  Laterality: N/A;   eyelid surgery     FOOT SURGERY Right 2009   HEMORROIDECTOMY     HERNIA REPAIR     mrercp  2014   NOSE SURGERY  2011   REVERSE SHOULDER ARTHROPLASTY Left 02/09/2022   Procedure: REVERSE SHOULDER ARTHROPLASTY AND BICEPS TENODESIS.;  Surgeon: Corky Mull, MD;  Location: [facility] ORS;  Service: Orthopedics;  Laterality: Left;   RHINOPLASTY     squamous cell carcinoma removed from right hand  2013   TONSILLECTOMY     WISDOM TOOTH EXTRACTION      FAMILY HISTORY: Family History  Problem Relation Age of Onset   Hypothyroidism Mother    Diabetes Mother    Lung cancer Mother     ADVANCED DIRECTIVES (Y/N):  N  HEALTH MAINTENANCE: Social History   Tobacco Use   Smoking status: Former    Packs/day: 1.00    Years: 25.00    Total pack years: 25.00    Types: Cigarettes    Quit date: 12/05/1986    Years since quitting: 35.2   Smokeless tobacco: Never  Vaping Use   Vaping Use: Never used  Substance Use Topics   Alcohol use: Yes    Alcohol/week: 14.0 standard drinks of alcohol    Types: 14 Cans of beer per week    Comment: 2 beers per dayOR  a couple glasses of wine   Drug use: No    Colonoscopy:  PAP:  Bone  density:  Lipid panel:  Allergies  Allergen Reactions   Nsaids     Can take Ibuprofen in moderation/high doses or prolonged use causes GI bleed    Current Outpatient Medications  Medication Sig Dispense Refill   acetaminophen (TYLENOL) 650 MG CR tablet Take 650 mg by mouth every 8 (eight) hours as needed for pain.     cetirizine (ZYRTEC) 10 MG tablet Take 10 mg by mouth every morning.     clindamycin (CLEOCIN T) 1 % external solution Apply 1 % topically as needed. To head     diltiazem (CARDIZEM) 30 MG tablet Take 1 tablet (30 mg total) by mouth every 12 (twelve) hours. 60 tablet 0   fluticasone (FLONASE) 50 MCG/ACT nasal spray Place 1 spray into both nostrils daily. 9.9 mL 0   furosemide (LASIX) 20 MG tablet Take 40 mg by mouth every morning.     pantoprazole (PROTONIX) 40 MG tablet Take 1 tablet (40 mg total) by mouth daily. 30 tablet 0   sodium chloride (OCEAN) 0.65 % SOLN nasal spray Place 1 spray into  both nostrils as needed for congestion. 15 mL 0   tamsulosin (FLOMAX) 0.4 MG CAPS capsule Take 1 capsule (0.4 mg total) by mouth daily. 30 capsule 0   timolol (TIMOPTIC) 0.5 % ophthalmic solution Place 1 drop into both eyes daily.      HYDROcodone-acetaminophen (NORCO/VICODIN) 5-325 MG tablet Take 1-2 tablets by mouth every 6 (six) hours as needed for moderate pain or severe pain. (Patient not taking: Reported on 02/27/2022) 30 tablet 0   No current facility-administered medications for this visit.    OBJECTIVE: Vitals:   02/27/22 1328  BP: 106/67  Pulse: 78  Resp: 18  Temp: 97.6 F (36.4 C)  SpO2: 95%      Body mass index is 30.67 kg/m.    ECOG FS:2 - Symptomatic, <50% confined to bed  General: Well-developed, well-nourished, no acute distress. In wheelchair. Unaccompanied.  Eyes: Pink conjunctiva, anicteric sclera. HEENT: Normocephalic, moist mucous membranes. Lungs: diminished bilaterally. No wheezes or crackles.  Heart: irregularly irregular rhythm Abdomen: Soft,  nontender, no obvious distention. Musculoskeletal: No edema, cyanosis, or clubbing. Neuro: Alert, answering all questions appropriately.  Skin: No rashes or petechiae noted. Left shoulder incision well approximated with steri strips in place. No evidence of infection.  Psych: Normal affect.   LAB RESULTS: Lab Results  Component Value Date   NA 139 02/27/2022   K 3.6 02/27/2022   CL 100 02/27/2022   CO2 29 02/27/2022   GLUCOSE 79 02/27/2022   BUN 24 (H) 02/27/2022   CREATININE 0.66 02/27/2022   CALCIUM 8.8 (L) 02/27/2022   PROT 7.1 02/27/2022   ALBUMIN 3.4 (L) 02/27/2022   AST 20 02/27/2022   ALT 13 02/27/2022   ALKPHOS 100 02/27/2022   BILITOT 0.6 02/27/2022   GFRNONAA >60 02/27/2022   GFRAA >60 04/21/2019    Lab Results  Component Value Date   WBC 6.6 02/27/2022   NEUTROABS 4.4 02/27/2022   HGB 10.1 (L) 02/27/2022   HCT 33.1 (L) 02/27/2022   MCV 85.5 02/27/2022   PLT 415 (H) 02/27/2022   Lab Results  Component Value Date   IRON 23 (L) 02/11/2022   TIBC 456 (H) 02/11/2022   IRONPCTSAT 5 (L) 02/11/2022   Lab Results  Component Value Date   FERRITIN 19 (L) 02/11/2022    STUDIES: DG Chest Port 1 View  Result Date: 02/11/2022 CLINICAL DATA:  Shortness of breath.  Sepsis. EXAM: PORTABLE CHEST 1 VIEW COMPARISON:  02/10/2022 FINDINGS: Low lung volumes are again noted. Small left pleural effusion is increased in size. Left lower lobe atelectasis or infiltrate also shows mild increase. Right lung remains clear. Left shoulder prosthesis and overlying skin staples again noted. IMPRESSION: Increased small left pleural effusion and left lower lobe atelectasis or infiltrate. Electronically Signed   By: Marlaine Hind M.D.   On: 02/11/2022 13:08   ECHOCARDIOGRAM COMPLETE  Result Date: 02/11/2022    ECHOCARDIOGRAM REPORT   Patient Name:   Marcus Coleman Date of Exam: 02/11/2022 Medical Rec #:  [id number]      Height:       66.0 in Accession #:    [id number]     Weight:       215.2  lb Date of Birth:  27-Aug-1940       BSA:          2.063 m Patient Age:    36 years       BP:           104/91 mmHg Patient Gender:  M              HR:           91 bpm. Exam Location:  [facility] Procedure: 2D Echo Indications:     Syncope R55  History:         Patient has no prior history of Echocardiogram examinations.  Sonographer:     Kathlen Brunswick RDCS Referring Phys:  [place] Diagnosing Phys: Yolonda Kida MD  Sonographer Comments: Technically challenging study due to limited acoustic windows, suboptimal apical window and no subcostal window. Very challenging study due to patient's recent left shoulder surgery, unable to remove sling to acquire better images. IMPRESSIONS  1. Left ventricular ejection fraction, by estimation, is 50 to 55%. The left ventricle has low normal function. The left ventricle demonstrates global hypokinesis. The left ventricular internal cavity size was moderately dilated. Left ventricular diastolic function could not be evaluated. There is the interventricular septum is flattened in diastole ('D' shaped left ventricle), consistent with right ventricular volume overload.  2. Right ventricular systolic function is moderately reduced. The right ventricular size is moderately enlarged. Mildly increased right ventricular wall thickness.  3. Left atrial size was mildly dilated.  4. Right atrial size was moderately dilated.  5. The mitral valve is myxomatous. Mild to moderate mitral valve regurgitation.  6. Tricuspid valve regurgitation is moderate.  7. The aortic valve is normal in structure. Aortic valve regurgitation is not visualized. FINDINGS  Left Ventricle: Left ventricular ejection fraction, by estimation, is 50 to 55%. The left ventricle has low normal function. The left ventricle demonstrates global hypokinesis. The left ventricular internal cavity size was moderately dilated. There is no left ventricular hypertrophy. The interventricular septum is flattened in  diastole ('D' shaped left ventricle), consistent with right ventricular volume overload. Left ventricular diastolic function could not be evaluated. Right Ventricle: The right ventricular size is moderately enlarged. Mildly increased right ventricular wall thickness. Right ventricular systolic function is moderately reduced. Left Atrium: Left atrial size was mildly dilated. Right Atrium: Right atrial size was moderately dilated. Pericardium: There is no evidence of pericardial effusion. Mitral Valve: The mitral valve is myxomatous. Mild to moderate mitral valve regurgitation. Tricuspid Valve: The tricuspid valve is grossly normal. Tricuspid valve regurgitation is moderate. Aortic Valve: The aortic valve is normal in structure. Aortic valve regurgitation is not visualized. Pulmonic Valve: The pulmonic valve was grossly normal. Pulmonic valve regurgitation is trivial. Aorta: The ascending aorta was not well visualized. IAS/Shunts: No atrial level shunt detected by color flow Doppler.  LEFT VENTRICLE PLAX 2D LVIDd:         5.20 cm   Diastology LVIDs:         3.70 cm   LV e' medial:    12.20 cm/s LV PW:         1.00 cm   LV E/e' medial:  8.3 LV IVS:        1.20 cm   LV e' lateral:   12.60 cm/s LVOT diam:     1.80 cm   LV E/e' lateral: 8.0 LV SV:         35 LV SV Index:   17 LVOT Area:     2.54 cm  RIGHT VENTRICLE RV Basal diam:  5.10 cm TAPSE (M-mode): 1.6 cm LEFT ATRIUM         Index       RIGHT ATRIUM           Index LA  diam:    4.60 cm 2.23 cm/m  RA Area:     36.10 cm                                 RA Volume:   154.00 ml 74.65 ml/m  AORTIC VALVE LVOT Vmax:   80.60 cm/s LVOT Vmean:  48.300 cm/s LVOT VTI:    0.136 m  AORTA Ao Root diam: 3.00 cm MITRAL VALVE                TRICUSPID VALVE MV Area (PHT): 3.79 cm     TR Peak grad:   53.9 mmHg MV Decel Time: 200 msec     TR Vmax:        367.00 cm/s MV E velocity: 101.00 cm/s                             SHUNTS                             Systemic VTI:  0.14 m                              Systemic Diam: 1.80 cm Yolonda Kida MD Electronically signed by Yolonda Kida MD Signature Date/Time: 02/11/2022/1:04:32 PM    Final    CT Angio Chest PE W and/or Wo Contrast  Result Date: 02/10/2022 CLINICAL DATA:  Hypoxemia. EXAM: CT ANGIOGRAPHY CHEST WITH CONTRAST TECHNIQUE: Multidetector CT imaging of the chest was performed using the standard protocol during bolus administration of intravenous contrast. Multiplanar CT image reconstructions and MIPs were obtained to evaluate the vascular anatomy. RADIATION DOSE REDUCTION: This exam was performed according to the departmental dose-optimization program which includes automated exposure control, adjustment of the mA and/or kV according to patient size and/or use of iterative reconstruction technique. CONTRAST:  74m OMNIPAQUE IOHEXOL 350 MG/ML SOLN COMPARISON:  None Available. FINDINGS: Cardiovascular: Heart is mildly enlarged. Aorta is normal in size. There are atherosclerotic calcifications of the aorta and coronary arteries. There is no pericardial effusion. There is adequate opacification of the pulmonary arteries. No pulmonary embolism identified. Mediastinum/Nodes: There is a large hiatal hernia containing the proximal stomach. No enlarged lymph nodes. Visualized thyroid gland within normal limits. Lungs/Pleura: Trace bilateral pleural effusions. There is compressive atelectasis in the bilateral lower lobes. There is some additional patchy airspace disease in the left lower lobe. There is no pneumothorax. Upper Abdomen: There is trace free fluid in the left upper quadrant. Musculoskeletal: Changes of recent left shoulder arthroplasty present with surrounding soft tissue swelling and air. Review of the MIP images confirms the above findings. IMPRESSION: 1. No evidence for pulmonary embolism. 2. Trace bilateral pleural effusions. 3. Patchy airspace disease in the left lower lobe worrisome for pneumonia. 4. Cardiomegaly. 5. Large  hiatal hernia. 6. Small amount of free fluid in the left upper quadrant. 7. Recent left shoulder arthroplasty. Aortic Atherosclerosis (ICD10-I70.0). Electronically Signed   By: ARonney AstersM.D.   On: 02/10/2022 23:44   DG Chest Port 1 View  Result Date: 02/10/2022 CLINICAL DATA:  Sepsis.  Hypotension and bradycardia. EXAM: PORTABLE CHEST 1 VIEW COMPARISON:  Radiographs 04/15/2007.  Abdominopelvic CT 12/30/2021. FINDINGS: 1659 hours. Lower lung volumes and lordotic positioning. Interval mildly increased elevation of the left hemidiaphragm  with mild left basilar atelectasis and a possible small left pleural effusion. The right lung appears clear. The heart is enlarged. There is a moderate size hiatal hernia. No evidence of pneumothorax. Postsurgical changes from recent left shoulder reverse arthroplasty with skin staples and a small amount of soft tissue emphysema. No acute osseous findings are evident. Telemetry leads overlie the chest. IMPRESSION: 1. Interval mildly increased elevation of the left hemidiaphragm with mild left basilar atelectasis and possible small left pleural effusion. 2. Cardiomegaly and hiatal hernia. Electronically Signed   By: Richardean Sale M.D.   On: 02/10/2022 17:15   DG Shoulder Left Port  Result Date: 02/09/2022 CLINICAL DATA:  Status post reverse shoulder arthroplasty EXAM: LEFT SHOULDER COMPARISON:  None Available. FINDINGS: Postsurgical changes of left reverse total shoulder arthroplasty. Normal alignment without evidence of loosening or periprosthetic fracture. Expected soft tissue changes. IMPRESSION: Postsurgical changes of reverse left total shoulder arthroplasty. No evidence of immediate hardware complication. Electronically Signed   By: Maurine Simmering M.D.   On: 02/09/2022 14:34   [place] OR NERVE BLOCK-IMAGE ONLY [facility])  Result Date: 02/09/2022 There is no interpretation for this exam.  This order is for images obtained during a surgical procedure.  Please See  "Surgeries" Tab for more information regarding the procedure.    ASSESSMENT: Iron deficiency anemia  1. Iron deficiency anemia: hemoglobin dipped to 7.4 post shoulder surgery. He received IV Venofer 300 mg during hospitalization. Today, hemoglobin has improved to 10.1 but continues to be decreased from his recent baseline of around 11. Ferritin was decreased in early December. Last colonoscopy 06/26/16. EGD 08/07/19 did not reveal any significant pathology. He does have hx of hiatal hernia. Also prior hx of gastric ulcers. Can consider re-referring to GI for re-evaluation vs possible capsule study in future if needed. Plan for additional IV iron- Feraheme x 1.   2.  B12 deficiency: B12 levels pending.  Patient last received treatment on Jul 26, 2021.  3. A fib- on cardizem. Not on anticoagulation. Managed by cardiology/Dr Paraschos.   4. Cardiac arrest- thought to be secondary to sepsis d/t LLL pneumonia 02/2022. Stable.   Disposition: Feraheme x 1 (first available) Rtc as scheduled.   I spent a total of 30 minutes reviewing chart data, face-to-face evaluation with the patient, counseling and coordination of care as detailed above.  Patient expressed understanding and was in agreement with this plan. He also understands that He can call clinic at any time with any questions, concerns, or complaints.   Verlon Au, NP  02/27/2022

## 2022-02-27 NOTE — Progress Notes (Signed)
Pt had shoulder surgery on left shoulder on 02/09/22.

## 2022-02-28 ENCOUNTER — Encounter: Payer: Self-pay | Admitting: Oncology

## 2022-03-02 ENCOUNTER — Inpatient Hospital Stay: Payer: PPO

## 2022-03-02 VITALS — BP 101/65 | HR 60 | Temp 97.5°F

## 2022-03-02 DIAGNOSIS — D509 Iron deficiency anemia, unspecified: Secondary | ICD-10-CM | POA: Diagnosis not present

## 2022-03-02 MED ORDER — SODIUM CHLORIDE 0.9 % IV SOLN
510.0000 mg | Freq: Once | INTRAVENOUS | Status: AC
  Administered 2022-03-02: 510 mg via INTRAVENOUS
  Filled 2022-03-02: qty 510

## 2022-03-02 MED ORDER — SODIUM CHLORIDE 0.9 % IV SOLN
Freq: Once | INTRAVENOUS | Status: AC
  Filled 2022-03-02: qty 250

## 2022-03-02 NOTE — Patient Instructions (Signed)

## 2022-03-07 DIAGNOSIS — I4891 Unspecified atrial fibrillation: Secondary | ICD-10-CM | POA: Diagnosis not present

## 2022-03-07 DIAGNOSIS — N401 Enlarged prostate with lower urinary tract symptoms: Secondary | ICD-10-CM | POA: Diagnosis not present

## 2022-03-07 DIAGNOSIS — A403 Sepsis due to Streptococcus pneumoniae: Secondary | ICD-10-CM | POA: Diagnosis not present

## 2022-03-07 DIAGNOSIS — M75122 Complete rotator cuff tear or rupture of left shoulder, not specified as traumatic: Secondary | ICD-10-CM | POA: Diagnosis not present

## 2022-03-07 DIAGNOSIS — I10 Essential (primary) hypertension: Secondary | ICD-10-CM | POA: Diagnosis not present

## 2022-03-07 DIAGNOSIS — I2510 Atherosclerotic heart disease of native coronary artery without angina pectoris: Secondary | ICD-10-CM | POA: Diagnosis not present

## 2022-03-07 DIAGNOSIS — D500 Iron deficiency anemia secondary to blood loss (chronic): Secondary | ICD-10-CM | POA: Diagnosis not present

## 2022-03-07 DIAGNOSIS — J9601 Acute respiratory failure with hypoxia: Secondary | ICD-10-CM | POA: Diagnosis not present

## 2022-03-08 DIAGNOSIS — Z87891 Personal history of nicotine dependence: Secondary | ICD-10-CM | POA: Diagnosis not present

## 2022-03-08 DIAGNOSIS — E785 Hyperlipidemia, unspecified: Secondary | ICD-10-CM | POA: Diagnosis not present

## 2022-03-08 DIAGNOSIS — I2510 Atherosclerotic heart disease of native coronary artery without angina pectoris: Secondary | ICD-10-CM | POA: Diagnosis not present

## 2022-03-08 DIAGNOSIS — M519 Unspecified thoracic, thoracolumbar and lumbosacral intervertebral disc disorder: Secondary | ICD-10-CM | POA: Diagnosis not present

## 2022-03-08 DIAGNOSIS — J9601 Acute respiratory failure with hypoxia: Secondary | ICD-10-CM | POA: Diagnosis not present

## 2022-03-08 DIAGNOSIS — Z79899 Other long term (current) drug therapy: Secondary | ICD-10-CM | POA: Diagnosis not present

## 2022-03-08 DIAGNOSIS — Z96612 Presence of left artificial shoulder joint: Secondary | ICD-10-CM | POA: Diagnosis not present

## 2022-03-08 DIAGNOSIS — N39498 Other specified urinary incontinence: Secondary | ICD-10-CM | POA: Diagnosis not present

## 2022-03-08 DIAGNOSIS — Z85828 Personal history of other malignant neoplasm of skin: Secondary | ICD-10-CM | POA: Diagnosis not present

## 2022-03-08 DIAGNOSIS — I4891 Unspecified atrial fibrillation: Secondary | ICD-10-CM | POA: Diagnosis not present

## 2022-03-08 DIAGNOSIS — J309 Allergic rhinitis, unspecified: Secondary | ICD-10-CM | POA: Diagnosis not present

## 2022-03-08 DIAGNOSIS — A403 Sepsis due to Streptococcus pneumoniae: Secondary | ICD-10-CM | POA: Diagnosis not present

## 2022-03-08 DIAGNOSIS — D649 Anemia, unspecified: Secondary | ICD-10-CM | POA: Diagnosis not present

## 2022-03-08 DIAGNOSIS — M75122 Complete rotator cuff tear or rupture of left shoulder, not specified as traumatic: Secondary | ICD-10-CM | POA: Diagnosis not present

## 2022-03-08 DIAGNOSIS — Z9181 History of falling: Secondary | ICD-10-CM | POA: Diagnosis not present

## 2022-03-08 DIAGNOSIS — I10 Essential (primary) hypertension: Secondary | ICD-10-CM | POA: Diagnosis not present

## 2022-03-08 DIAGNOSIS — N401 Enlarged prostate with lower urinary tract symptoms: Secondary | ICD-10-CM | POA: Diagnosis not present

## 2022-03-08 DIAGNOSIS — M545 Low back pain, unspecified: Secondary | ICD-10-CM | POA: Diagnosis not present

## 2022-03-08 DIAGNOSIS — Z428 Encounter for other plastic and reconstructive surgery following medical procedure or healed injury: Secondary | ICD-10-CM | POA: Diagnosis not present

## 2022-03-08 DIAGNOSIS — G8929 Other chronic pain: Secondary | ICD-10-CM | POA: Diagnosis not present

## 2022-03-08 DIAGNOSIS — Z86718 Personal history of other venous thrombosis and embolism: Secondary | ICD-10-CM | POA: Diagnosis not present

## 2022-03-08 DIAGNOSIS — K529 Noninfective gastroenteritis and colitis, unspecified: Secondary | ICD-10-CM | POA: Diagnosis not present

## 2022-03-08 DIAGNOSIS — D500 Iron deficiency anemia secondary to blood loss (chronic): Secondary | ICD-10-CM | POA: Diagnosis not present

## 2022-03-08 DIAGNOSIS — Z86010 Personal history of colonic polyps: Secondary | ICD-10-CM | POA: Diagnosis not present

## 2022-03-09 DIAGNOSIS — N401 Enlarged prostate with lower urinary tract symptoms: Secondary | ICD-10-CM | POA: Diagnosis not present

## 2022-03-09 DIAGNOSIS — E785 Hyperlipidemia, unspecified: Secondary | ICD-10-CM | POA: Diagnosis not present

## 2022-03-09 DIAGNOSIS — Z79899 Other long term (current) drug therapy: Secondary | ICD-10-CM | POA: Diagnosis not present

## 2022-03-09 DIAGNOSIS — D500 Iron deficiency anemia secondary to blood loss (chronic): Secondary | ICD-10-CM | POA: Diagnosis not present

## 2022-03-09 DIAGNOSIS — I10 Essential (primary) hypertension: Secondary | ICD-10-CM | POA: Diagnosis not present

## 2022-03-09 DIAGNOSIS — M75122 Complete rotator cuff tear or rupture of left shoulder, not specified as traumatic: Secondary | ICD-10-CM | POA: Diagnosis not present

## 2022-03-09 DIAGNOSIS — J9601 Acute respiratory failure with hypoxia: Secondary | ICD-10-CM | POA: Diagnosis not present

## 2022-03-09 DIAGNOSIS — I482 Chronic atrial fibrillation, unspecified: Secondary | ICD-10-CM | POA: Diagnosis not present

## 2022-03-09 DIAGNOSIS — G8929 Other chronic pain: Secondary | ICD-10-CM | POA: Diagnosis not present

## 2022-03-09 DIAGNOSIS — D649 Anemia, unspecified: Secondary | ICD-10-CM | POA: Diagnosis not present

## 2022-03-09 DIAGNOSIS — M545 Low back pain, unspecified: Secondary | ICD-10-CM | POA: Diagnosis not present

## 2022-03-09 DIAGNOSIS — Z428 Encounter for other plastic and reconstructive surgery following medical procedure or healed injury: Secondary | ICD-10-CM | POA: Diagnosis not present

## 2022-03-09 DIAGNOSIS — D509 Iron deficiency anemia, unspecified: Secondary | ICD-10-CM | POA: Diagnosis not present

## 2022-03-09 DIAGNOSIS — Z9181 History of falling: Secondary | ICD-10-CM | POA: Diagnosis not present

## 2022-03-09 DIAGNOSIS — R030 Elevated blood-pressure reading, without diagnosis of hypertension: Secondary | ICD-10-CM | POA: Diagnosis not present

## 2022-03-09 DIAGNOSIS — Z86718 Personal history of other venous thrombosis and embolism: Secondary | ICD-10-CM | POA: Diagnosis not present

## 2022-03-09 DIAGNOSIS — E7800 Pure hypercholesterolemia, unspecified: Secondary | ICD-10-CM | POA: Diagnosis not present

## 2022-03-09 DIAGNOSIS — Z09 Encounter for follow-up examination after completed treatment for conditions other than malignant neoplasm: Secondary | ICD-10-CM | POA: Diagnosis not present

## 2022-03-09 DIAGNOSIS — J309 Allergic rhinitis, unspecified: Secondary | ICD-10-CM | POA: Diagnosis not present

## 2022-03-09 DIAGNOSIS — K529 Noninfective gastroenteritis and colitis, unspecified: Secondary | ICD-10-CM | POA: Diagnosis not present

## 2022-03-09 DIAGNOSIS — M519 Unspecified thoracic, thoracolumbar and lumbosacral intervertebral disc disorder: Secondary | ICD-10-CM | POA: Diagnosis not present

## 2022-03-09 DIAGNOSIS — I4819 Other persistent atrial fibrillation: Secondary | ICD-10-CM | POA: Diagnosis not present

## 2022-03-09 DIAGNOSIS — I480 Paroxysmal atrial fibrillation: Secondary | ICD-10-CM | POA: Diagnosis not present

## 2022-03-09 DIAGNOSIS — R600 Localized edema: Secondary | ICD-10-CM | POA: Diagnosis not present

## 2022-03-09 DIAGNOSIS — Z85828 Personal history of other malignant neoplasm of skin: Secondary | ICD-10-CM | POA: Diagnosis not present

## 2022-03-09 DIAGNOSIS — A403 Sepsis due to Streptococcus pneumoniae: Secondary | ICD-10-CM | POA: Diagnosis not present

## 2022-03-09 DIAGNOSIS — Z96612 Presence of left artificial shoulder joint: Secondary | ICD-10-CM | POA: Diagnosis not present

## 2022-03-09 DIAGNOSIS — N39498 Other specified urinary incontinence: Secondary | ICD-10-CM | POA: Diagnosis not present

## 2022-03-09 DIAGNOSIS — Z86010 Personal history of colonic polyps: Secondary | ICD-10-CM | POA: Diagnosis not present

## 2022-03-09 DIAGNOSIS — Z87891 Personal history of nicotine dependence: Secondary | ICD-10-CM | POA: Diagnosis not present

## 2022-03-09 DIAGNOSIS — I4891 Unspecified atrial fibrillation: Secondary | ICD-10-CM | POA: Diagnosis not present

## 2022-03-09 DIAGNOSIS — I2510 Atherosclerotic heart disease of native coronary artery without angina pectoris: Secondary | ICD-10-CM | POA: Diagnosis not present

## 2022-03-13 ENCOUNTER — Other Ambulatory Visit (HOSPITAL_BASED_OUTPATIENT_CLINIC_OR_DEPARTMENT_OTHER): Payer: Self-pay | Admitting: Osteopathic Medicine

## 2022-03-14 DIAGNOSIS — M25512 Pain in left shoulder: Secondary | ICD-10-CM | POA: Diagnosis not present

## 2022-03-14 DIAGNOSIS — Z96612 Presence of left artificial shoulder joint: Secondary | ICD-10-CM | POA: Diagnosis not present

## 2022-03-15 ENCOUNTER — Ambulatory Visit: Payer: PPO

## 2022-03-24 DIAGNOSIS — Z96612 Presence of left artificial shoulder joint: Secondary | ICD-10-CM | POA: Diagnosis not present

## 2022-03-24 DIAGNOSIS — M25512 Pain in left shoulder: Secondary | ICD-10-CM | POA: Diagnosis not present

## 2022-03-30 DIAGNOSIS — M25512 Pain in left shoulder: Secondary | ICD-10-CM | POA: Diagnosis not present

## 2022-03-30 DIAGNOSIS — Z96612 Presence of left artificial shoulder joint: Secondary | ICD-10-CM | POA: Diagnosis not present

## 2022-03-30 DIAGNOSIS — I4819 Other persistent atrial fibrillation: Secondary | ICD-10-CM | POA: Diagnosis not present

## 2022-03-31 DIAGNOSIS — Z96612 Presence of left artificial shoulder joint: Secondary | ICD-10-CM | POA: Diagnosis not present

## 2022-04-03 DIAGNOSIS — I4819 Other persistent atrial fibrillation: Secondary | ICD-10-CM | POA: Diagnosis not present

## 2022-04-03 DIAGNOSIS — Z86718 Personal history of other venous thrombosis and embolism: Secondary | ICD-10-CM | POA: Diagnosis not present

## 2022-04-03 DIAGNOSIS — R030 Elevated blood-pressure reading, without diagnosis of hypertension: Secondary | ICD-10-CM | POA: Diagnosis not present

## 2022-04-03 DIAGNOSIS — I340 Nonrheumatic mitral (valve) insufficiency: Secondary | ICD-10-CM | POA: Diagnosis not present

## 2022-04-03 DIAGNOSIS — E7800 Pure hypercholesterolemia, unspecified: Secondary | ICD-10-CM | POA: Diagnosis not present

## 2022-04-03 DIAGNOSIS — R55 Syncope and collapse: Secondary | ICD-10-CM | POA: Diagnosis not present

## 2022-04-05 ENCOUNTER — Inpatient Hospital Stay: Payer: PPO | Attending: Oncology

## 2022-04-05 ENCOUNTER — Encounter: Payer: Self-pay | Admitting: Oncology

## 2022-04-05 ENCOUNTER — Ambulatory Visit: Payer: PPO | Admitting: Oncology

## 2022-04-05 DIAGNOSIS — D509 Iron deficiency anemia, unspecified: Secondary | ICD-10-CM | POA: Diagnosis not present

## 2022-04-05 DIAGNOSIS — Z8701 Personal history of pneumonia (recurrent): Secondary | ICD-10-CM | POA: Insufficient documentation

## 2022-04-05 DIAGNOSIS — Z79899 Other long term (current) drug therapy: Secondary | ICD-10-CM | POA: Diagnosis not present

## 2022-04-05 DIAGNOSIS — I4891 Unspecified atrial fibrillation: Secondary | ICD-10-CM | POA: Insufficient documentation

## 2022-04-05 DIAGNOSIS — E538 Deficiency of other specified B group vitamins: Secondary | ICD-10-CM | POA: Diagnosis not present

## 2022-04-05 LAB — CBC WITH DIFFERENTIAL/PLATELET
Abs Immature Granulocytes: 0.01 10*3/uL (ref 0.00–0.07)
Basophils Absolute: 0 10*3/uL (ref 0.0–0.1)
Basophils Relative: 1 %
Eosinophils Absolute: 0.2 10*3/uL (ref 0.0–0.5)
Eosinophils Relative: 5 %
HCT: 36.9 % — ABNORMAL LOW (ref 39.0–52.0)
Hemoglobin: 11.8 g/dL — ABNORMAL LOW (ref 13.0–17.0)
Immature Granulocytes: 0 %
Lymphocytes Relative: 25 %
Lymphs Abs: 1.1 10*3/uL (ref 0.7–4.0)
MCH: 28.4 pg (ref 26.0–34.0)
MCHC: 32 g/dL (ref 30.0–36.0)
MCV: 88.9 fL (ref 80.0–100.0)
Monocytes Absolute: 0.4 10*3/uL (ref 0.1–1.0)
Monocytes Relative: 9 %
Neutro Abs: 2.6 10*3/uL (ref 1.7–7.7)
Neutrophils Relative %: 60 %
Platelets: 142 10*3/uL — ABNORMAL LOW (ref 150–400)
RBC: 4.15 MIL/uL — ABNORMAL LOW (ref 4.22–5.81)
RDW: 21.2 % — ABNORMAL HIGH (ref 11.5–15.5)
WBC: 4.3 10*3/uL (ref 4.0–10.5)
nRBC: 0 % (ref 0.0–0.2)

## 2022-04-05 LAB — IRON AND TIBC
Iron: 78 ug/dL (ref 45–182)
Saturation Ratios: 24 % (ref 17.9–39.5)
TIBC: 330 ug/dL (ref 250–450)
UIBC: 252 ug/dL

## 2022-04-05 LAB — FERRITIN: Ferritin: 138 ng/mL (ref 24–336)

## 2022-04-05 LAB — VITAMIN B12: Vitamin B-12: 289 pg/mL (ref 180–914)

## 2022-04-06 ENCOUNTER — Inpatient Hospital Stay: Payer: PPO

## 2022-04-06 ENCOUNTER — Inpatient Hospital Stay (HOSPITAL_BASED_OUTPATIENT_CLINIC_OR_DEPARTMENT_OTHER): Payer: PPO | Admitting: Nurse Practitioner

## 2022-04-06 ENCOUNTER — Encounter: Payer: Self-pay | Admitting: Nurse Practitioner

## 2022-04-06 VITALS — BP 124/81 | HR 94 | Temp 96.4°F | Wt 183.0 lb

## 2022-04-06 DIAGNOSIS — D509 Iron deficiency anemia, unspecified: Secondary | ICD-10-CM | POA: Diagnosis not present

## 2022-04-06 DIAGNOSIS — E538 Deficiency of other specified B group vitamins: Secondary | ICD-10-CM | POA: Diagnosis not present

## 2022-04-06 MED ORDER — CYANOCOBALAMIN 1000 MCG/ML IJ SOLN
1000.0000 ug | Freq: Once | INTRAMUSCULAR | Status: AC
  Administered 2022-04-06: 1000 ug via INTRAMUSCULAR
  Filled 2022-04-06: qty 1

## 2022-04-06 NOTE — Progress Notes (Signed)
[facility]  Telephone:(336) [phone] Fax:(336) [phone]  ID: CLEBERT WENGER OB: Mar 16, 1940  MR#: [id number]  PRF#:[id number]  Patient Care Team: Idelle Crouch, MD as PCP - General (Internal Medicine) Lloyd Huger, MD as Consulting Physician (Hematology and Oncology)  CHIEF COMPLAINT: Iron deficiency anemia.  INTERVAL HISTORY: Patient returns to clinic today for repeat laboratory work, further evaluation, and consideration of additional IV Feraheme.  He continues to recover post shoulder surgery. Wants to start swimming and scuba diving again. Is excited about his youngest grand daughter who just turned 2. Wants to see her grown. He's feeling better. Energy is good. Unable to tolerate oral iron. No black or bloody stools.   REVIEW OF SYSTEMS:   Review of Systems  Constitutional:  Negative for fever, malaise/fatigue and weight loss.  Respiratory: Negative.  Negative for cough, hemoptysis and shortness of breath.   Cardiovascular: Negative.  Negative for chest pain and leg swelling.  Gastrointestinal: Negative.  Negative for abdominal pain, blood in stool and melena.  Genitourinary: Negative.  Negative for hematuria.  Musculoskeletal: Negative.  Negative for back pain and falls.  Skin: Negative.  Negative for rash.  Neurological:  Negative for dizziness, focal weakness, weakness and headaches.  Psychiatric/Behavioral: Negative.  The patient is not nervous/anxious.   As per HPI. Otherwise, a complete review of systems is negative.  PAST MEDICAL HISTORY: Past Medical History:  Diagnosis Date   Aortic atherosclerosis ([place])    Atrial fibrillation ([place])    a.) CHA2DS2VASc = 6 (age x2, HTN, DVT x2, vascular disease history);  b.) rate/rhythm maintained on oral diltiazem + metoprolol succinate; chronic anticoagulation (apixaban) discontinued due to GI bleeding   BPH with obstruction/lower urinary tract symptoms    Bronchitis 01/27/2022   CAD (coronary artery  disease)    Cardiomegaly    Chronic diarrhea    Compression fracture of thoracic vertebra ([place])    a.) CT renal 12/30/2021: chronic appearing T11-T12 (70% height loss)   DDD (degenerative disc disease), cervical    Diverticulosis    DVT (deep venous thrombosis) ([place]) 03/31/2019   a.) anticoagulation intiated (apixaban), however subsequently discontinued due to GI bleeding   ETOH abuse    GI bleed    Heart murmur    Hiatal hernia    Hyperlipidemia    Hypertension    IDA (iron deficiency anemia)    Low testosterone    Nephrolithiasis    Pneumonia    Rotator cuff arthropathy, left    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    Vasovagal syncope     PAST SURGICAL HISTORY: Past Surgical History:  Procedure Laterality Date   boils removed from right forearm  [place] EXTRACTION Left 2008   catracts Bilateral    left 2008, right 2013   CHOLECYSTECTOMY     COLONOSCOPY WITH PROPOFOL N/A 06/26/2016   Procedure: COLONOSCOPY WITH PROPOFOL;  Surgeon: Manya Silvas, MD;  Location: [facility] ENDOSCOPY;  Service: Endoscopy;  Laterality: N/A;   COLONOSCOPY WITH PROPOFOL N/A 07/03/2019   Procedure: COLONOSCOPY WITH PROPOFOL;  Surgeon: [name], MD;  Location[facility] ENDOSCOPY;  Service: Gastroenterology;  Laterality: N/A;   ENDOSCOPIC RETROGRADE CHOLANGIOPANCREATOGRAPHY (ERCP) WITH PROPOFOL     ENDOSCOPIC VEIN LASER TREATMENT     right leg 05/2015, left leg 09/2015   ESOPHAGOGASTRODUODENOSCOPY N/A 04/19/2019   Procedure: ESOPHAGOGASTRODUODENOSCOPY (EGD);  Surgeon: [name], MD;  Location: [facility] ENDOSCOPY;  Service: Gastroenterology;  Laterality: N/A;  ESOPHAGOGASTRODUODENOSCOPY (EGD) WITH PROPOFOL N/A 07/03/2019   Procedure: ESOPHAGOGASTRODUODENOSCOPY (EGD) WITH PROPOFOL;  Surgeon: [name], MD;  Location: [facility] ENDOSCOPY;  Service: Gastroenterology;  Laterality: N/A;   ESOPHAGOGASTRODUODENOSCOPY (EGD) WITH PROPOFOL N/A 08/07/2019   Procedure:  ESOPHAGOGASTRODUODENOSCOPY (EGD) WITH PROPOFOL;  Surgeon: Milus Banister, MD;  Location: [facility] ENDOSCOPY;  Service: Endoscopy;  Laterality: N/A;   EUS N/A 08/07/2019   Procedure: UPPER ENDOSCOPIC ULTRASOUND (EUS) RADIAL;  Surgeon: Milus Banister, MD;  Location: [facility] ENDOSCOPY;  Service: Endoscopy;  Laterality: N/A;   eyelid surgery     FOOT SURGERY Right 2009   HEMORROIDECTOMY     HERNIA REPAIR     mrercp  2014   NOSE SURGERY  2011   REVERSE SHOULDER ARTHROPLASTY Left 02/09/2022   Procedure: REVERSE SHOULDER ARTHROPLASTY AND BICEPS TENODESIS.;  Surgeon: Corky Mull, MD;  Location: [facility] ORS;  Service: Orthopedics;  Laterality: Left;   RHINOPLASTY     squamous cell carcinoma removed from right hand  2013   TONSILLECTOMY     WISDOM TOOTH EXTRACTION      FAMILY HISTORY: Family History  Problem Relation Age of Onset   Hypothyroidism Mother    Diabetes Mother    Lung cancer Mother     ADVANCED DIRECTIVES (Y/N):  N  HEALTH MAINTENANCE: Social History   Tobacco Use   Smoking status: Former    Packs/day: 1.00    Years: 25.00    Total pack years: 25.00    Types: Cigarettes    Quit date: 12/05/1986    Years since quitting: 35.3   Smokeless tobacco: Never  Vaping Use   Vaping Use: Never used  Substance Use Topics   Alcohol use: Yes    Alcohol/week: 14.0 standard drinks of alcohol    Types: 14 Cans of beer per week    Comment: 2 beers per dayOR  a couple glasses of wine   Drug use: No    Colonoscopy:  PAP:  Bone density:  Lipid panel:  Allergies  Allergen Reactions   Nsaids     Can take Ibuprofen in moderation/high doses or prolonged use causes GI bleed    Current Outpatient Medications  Medication Sig Dispense Refill   acetaminophen (TYLENOL) 650 MG CR tablet Take 650 mg by mouth every 8 (eight) hours as needed for pain.     cetirizine (ZYRTEC) 10 MG tablet Take 10 mg by mouth every morning.     clindamycin (CLEOCIN T) 1 % external solution Apply 1 % topically as  needed. To head     diltiazem (CARDIZEM) 30 MG tablet Take 1 tablet (30 mg total) by mouth every 12 (twelve) hours. 60 tablet 0   fluticasone (FLONASE) 50 MCG/ACT nasal spray Place 1 spray into both nostrils daily. 9.9 mL 0   furosemide (LASIX) 20 MG tablet Take 40 mg by mouth every morning.     pantoprazole (PROTONIX) 40 MG tablet Take 1 tablet (40 mg total) by mouth daily. 30 tablet 0   sodium chloride (OCEAN) 0.65 % SOLN nasal spray Place 1 spray into both nostrils as needed for congestion. 15 mL 0   tamsulosin (FLOMAX) 0.4 MG CAPS capsule Take 1 capsule (0.4 mg total) by mouth daily. 30 capsule 0   timolol (TIMOPTIC) 0.5 % ophthalmic solution Place 1 drop into both eyes daily.      HYDROcodone-acetaminophen (NORCO/VICODIN) 5-325 MG tablet Take 1-2 tablets by mouth every 6 (six) hours as needed for moderate pain or severe pain. (Patient not taking: Reported  on 02/27/2022) 30 tablet 0   No current facility-administered medications for this visit.    OBJECTIVE: Vitals:   04/06/22 1410  BP: 124/81  Pulse: 94  Temp: (!) 96.4 F (35.8 C)      Body mass index is 29.54 kg/m.    ECOG FS:2 - Symptomatic, <50% confined to bed  General: Well-developed, well-nourished, no acute distress. In wheelchair. Unaccompanied.  Eyes: Pink conjunctiva, anicteric sclera. HEENT: Normocephalic, moist mucous membranes. Lungs: diminished bilaterally. No wheezes or crackles.  Heart: irregularly irregular rhythm Abdomen: Soft, nontender, no obvious distention. Musculoskeletal: No edema, cyanosis, or clubbing. Neuro: Alert, answering all questions appropriately.  Skin: No rashes or petechiae noted. Psych: Normal affect.   LAB RESULTS: Lab Results  Component Value Date   NA 139 02/27/2022   K 3.6 02/27/2022   CL 100 02/27/2022   CO2 29 02/27/2022   GLUCOSE 79 02/27/2022   BUN 24 (H) 02/27/2022   CREATININE 0.66 02/27/2022   CALCIUM 8.8 (L) 02/27/2022   PROT 7.1 02/27/2022   ALBUMIN 3.4 (L)  02/27/2022   AST 20 02/27/2022   ALT 13 02/27/2022   ALKPHOS 100 02/27/2022   BILITOT 0.6 02/27/2022   GFRNONAA >60 02/27/2022   GFRAA >60 04/21/2019    Lab Results  Component Value Date   WBC 4.3 04/05/2022   NEUTROABS 2.6 04/05/2022   HGB 11.8 (L) 04/05/2022   HCT 36.9 (L) 04/05/2022   MCV 88.9 04/05/2022   PLT 142 (L) 04/05/2022   Lab Results  Component Value Date   IRON 78 04/05/2022   TIBC 330 04/05/2022   IRONPCTSAT 24 04/05/2022   Lab Results  Component Value Date   FERRITIN 138 04/05/2022    STUDIES: No results found.  ASSESSMENT: Iron deficiency anemia  1. Iron deficiency anemia: Last colonoscopy 06/26/16. EGD 08/07/19 did not reveal any significant pathology. He does have hx of hiatal hernia. Also prior hx of gastric ulcers. hemoglobin dipped to 7.4 post shoulder surgery. He received IV Venofer 300 mg during hospitalization. Received feraheme x 1 02/2022. Hemoglobin continues to improve, now 11.8. Ferritin 138. Iron sat 24%. Can consider re-referring to GI for re-evaluation vs possible capsule study in future if needed though hold for now given improvement. Hold iv iron for now.   2.  B12 deficiency: B12 level is low at 289.  Patient last received treatment on Jul 26, 2021. Plan for b12 today then monthly.   3. A fib- on cardizem. Not on anticoagulation. Managed by cardiology/Dr Paraschos.   4. Cardiac arrest- thought to be secondary to sepsis d/t LLL pneumonia 02/2022. Stable.   Disposition: B12 today 1 mo- b12 2 mo- b12 3 mo- lab (cbc, cmp, ferritin, iron studies, b12) Few days to week later see me or [name] +/- feraheme & b12- la  I spent a total of 30 minutes reviewing chart data, face-to-face evaluation with the patient, counseling and coordination of care as detailed above.  Patient expressed understanding and was in agreement with this plan. He also understands that He can call clinic at any time with any questions, concerns, or complaints.    Verlon Au, NP  04/06/2022

## 2022-04-07 DIAGNOSIS — R0689 Other abnormalities of breathing: Secondary | ICD-10-CM | POA: Diagnosis not present

## 2022-04-07 DIAGNOSIS — I4819 Other persistent atrial fibrillation: Secondary | ICD-10-CM | POA: Diagnosis not present

## 2022-04-07 DIAGNOSIS — Z79899 Other long term (current) drug therapy: Secondary | ICD-10-CM | POA: Diagnosis not present

## 2022-04-07 DIAGNOSIS — Z0000 Encounter for general adult medical examination without abnormal findings: Secondary | ICD-10-CM | POA: Diagnosis not present

## 2022-04-07 DIAGNOSIS — D509 Iron deficiency anemia, unspecified: Secondary | ICD-10-CM | POA: Diagnosis not present

## 2022-04-07 DIAGNOSIS — K449 Diaphragmatic hernia without obstruction or gangrene: Secondary | ICD-10-CM | POA: Diagnosis not present

## 2022-04-07 DIAGNOSIS — E782 Mixed hyperlipidemia: Secondary | ICD-10-CM | POA: Diagnosis not present

## 2022-04-07 DIAGNOSIS — R0989 Other specified symptoms and signs involving the circulatory and respiratory systems: Secondary | ICD-10-CM | POA: Diagnosis not present

## 2022-04-07 DIAGNOSIS — Z125 Encounter for screening for malignant neoplasm of prostate: Secondary | ICD-10-CM | POA: Diagnosis not present

## 2022-04-07 DIAGNOSIS — R030 Elevated blood-pressure reading, without diagnosis of hypertension: Secondary | ICD-10-CM | POA: Diagnosis not present

## 2022-04-10 DIAGNOSIS — M25512 Pain in left shoulder: Secondary | ICD-10-CM | POA: Diagnosis not present

## 2022-04-10 DIAGNOSIS — Z96612 Presence of left artificial shoulder joint: Secondary | ICD-10-CM | POA: Diagnosis not present

## 2022-04-13 DIAGNOSIS — M25512 Pain in left shoulder: Secondary | ICD-10-CM | POA: Diagnosis not present

## 2022-04-13 DIAGNOSIS — Z96612 Presence of left artificial shoulder joint: Secondary | ICD-10-CM | POA: Diagnosis not present

## 2022-04-17 DIAGNOSIS — M25512 Pain in left shoulder: Secondary | ICD-10-CM | POA: Diagnosis not present

## 2022-04-17 DIAGNOSIS — Z96612 Presence of left artificial shoulder joint: Secondary | ICD-10-CM | POA: Diagnosis not present

## 2022-04-20 DIAGNOSIS — M25512 Pain in left shoulder: Secondary | ICD-10-CM | POA: Diagnosis not present

## 2022-04-20 DIAGNOSIS — Z96612 Presence of left artificial shoulder joint: Secondary | ICD-10-CM | POA: Diagnosis not present

## 2022-04-21 ENCOUNTER — Encounter: Payer: Self-pay | Admitting: Oncology

## 2022-04-24 DIAGNOSIS — M25512 Pain in left shoulder: Secondary | ICD-10-CM | POA: Diagnosis not present

## 2022-04-24 DIAGNOSIS — Z96612 Presence of left artificial shoulder joint: Secondary | ICD-10-CM | POA: Diagnosis not present

## 2022-04-27 DIAGNOSIS — Z96612 Presence of left artificial shoulder joint: Secondary | ICD-10-CM | POA: Diagnosis not present

## 2022-05-01 ENCOUNTER — Encounter: Payer: Self-pay | Admitting: Oncology

## 2022-05-01 DIAGNOSIS — Z961 Presence of intraocular lens: Secondary | ICD-10-CM | POA: Diagnosis not present

## 2022-05-01 DIAGNOSIS — H40002 Preglaucoma, unspecified, left eye: Secondary | ICD-10-CM | POA: Diagnosis not present

## 2022-05-01 DIAGNOSIS — H401111 Primary open-angle glaucoma, right eye, mild stage: Secondary | ICD-10-CM | POA: Diagnosis not present

## 2022-05-04 DIAGNOSIS — Z96612 Presence of left artificial shoulder joint: Secondary | ICD-10-CM | POA: Diagnosis not present

## 2022-05-04 DIAGNOSIS — M25512 Pain in left shoulder: Secondary | ICD-10-CM | POA: Diagnosis not present

## 2022-05-08 ENCOUNTER — Ambulatory Visit: Payer: PPO

## 2022-05-08 ENCOUNTER — Encounter: Payer: Self-pay | Admitting: Oncology

## 2022-05-08 ENCOUNTER — Inpatient Hospital Stay: Payer: HMO | Attending: Oncology

## 2022-05-08 DIAGNOSIS — M25512 Pain in left shoulder: Secondary | ICD-10-CM | POA: Diagnosis not present

## 2022-05-08 DIAGNOSIS — Z96612 Presence of left artificial shoulder joint: Secondary | ICD-10-CM | POA: Diagnosis not present

## 2022-05-08 DIAGNOSIS — E538 Deficiency of other specified B group vitamins: Secondary | ICD-10-CM | POA: Diagnosis not present

## 2022-05-08 DIAGNOSIS — D509 Iron deficiency anemia, unspecified: Secondary | ICD-10-CM

## 2022-05-08 MED ORDER — CYANOCOBALAMIN 1000 MCG/ML IJ SOLN
1000.0000 ug | Freq: Once | INTRAMUSCULAR | Status: AC
  Administered 2022-05-08: 1000 ug via INTRAMUSCULAR
  Filled 2022-05-08: qty 1

## 2022-05-11 DIAGNOSIS — Z96612 Presence of left artificial shoulder joint: Secondary | ICD-10-CM | POA: Diagnosis not present

## 2022-05-11 DIAGNOSIS — M25512 Pain in left shoulder: Secondary | ICD-10-CM | POA: Diagnosis not present

## 2022-05-12 DIAGNOSIS — M12812 Other specific arthropathies, not elsewhere classified, left shoulder: Secondary | ICD-10-CM | POA: Diagnosis not present

## 2022-05-12 DIAGNOSIS — Z96612 Presence of left artificial shoulder joint: Secondary | ICD-10-CM | POA: Diagnosis not present

## 2022-05-15 DIAGNOSIS — M25512 Pain in left shoulder: Secondary | ICD-10-CM | POA: Diagnosis not present

## 2022-05-15 DIAGNOSIS — Z96612 Presence of left artificial shoulder joint: Secondary | ICD-10-CM | POA: Diagnosis not present

## 2022-05-19 DIAGNOSIS — Z96612 Presence of left artificial shoulder joint: Secondary | ICD-10-CM | POA: Diagnosis not present

## 2022-05-19 DIAGNOSIS — M25512 Pain in left shoulder: Secondary | ICD-10-CM | POA: Diagnosis not present

## 2022-05-22 DIAGNOSIS — M25512 Pain in left shoulder: Secondary | ICD-10-CM | POA: Diagnosis not present

## 2022-05-22 DIAGNOSIS — Z96612 Presence of left artificial shoulder joint: Secondary | ICD-10-CM | POA: Diagnosis not present

## 2022-05-25 DIAGNOSIS — Z96612 Presence of left artificial shoulder joint: Secondary | ICD-10-CM | POA: Diagnosis not present

## 2022-05-25 DIAGNOSIS — M25512 Pain in left shoulder: Secondary | ICD-10-CM | POA: Diagnosis not present

## 2022-05-29 DIAGNOSIS — Z96612 Presence of left artificial shoulder joint: Secondary | ICD-10-CM | POA: Diagnosis not present

## 2022-05-29 DIAGNOSIS — M25512 Pain in left shoulder: Secondary | ICD-10-CM | POA: Diagnosis not present

## 2022-06-01 DIAGNOSIS — Z96612 Presence of left artificial shoulder joint: Secondary | ICD-10-CM | POA: Diagnosis not present

## 2022-06-01 DIAGNOSIS — M25512 Pain in left shoulder: Secondary | ICD-10-CM | POA: Diagnosis not present

## 2022-06-05 ENCOUNTER — Inpatient Hospital Stay: Payer: HMO | Attending: Oncology

## 2022-06-05 DIAGNOSIS — D509 Iron deficiency anemia, unspecified: Secondary | ICD-10-CM | POA: Diagnosis not present

## 2022-06-05 DIAGNOSIS — M25512 Pain in left shoulder: Secondary | ICD-10-CM | POA: Diagnosis not present

## 2022-06-05 DIAGNOSIS — Z96612 Presence of left artificial shoulder joint: Secondary | ICD-10-CM | POA: Diagnosis not present

## 2022-06-05 DIAGNOSIS — E538 Deficiency of other specified B group vitamins: Secondary | ICD-10-CM | POA: Diagnosis not present

## 2022-06-05 DIAGNOSIS — Z79899 Other long term (current) drug therapy: Secondary | ICD-10-CM | POA: Diagnosis not present

## 2022-06-05 MED ORDER — CYANOCOBALAMIN 1000 MCG/ML IJ SOLN
1000.0000 ug | Freq: Once | INTRAMUSCULAR | Status: AC
  Administered 2022-06-05: 1000 ug via INTRAMUSCULAR
  Filled 2022-06-05: qty 1

## 2022-06-06 ENCOUNTER — Ambulatory Visit: Payer: PPO

## 2022-06-08 DIAGNOSIS — M25512 Pain in left shoulder: Secondary | ICD-10-CM | POA: Diagnosis not present

## 2022-06-08 DIAGNOSIS — Z96612 Presence of left artificial shoulder joint: Secondary | ICD-10-CM | POA: Diagnosis not present

## 2022-06-14 DIAGNOSIS — M25512 Pain in left shoulder: Secondary | ICD-10-CM | POA: Diagnosis not present

## 2022-06-14 DIAGNOSIS — Z96612 Presence of left artificial shoulder joint: Secondary | ICD-10-CM | POA: Diagnosis not present

## 2022-06-15 IMAGING — CT CT HEAD W/O CM
3 series · 16 of 47 positions shown, 19 images · non-contrast
Comparison: None.

CLINICAL DATA: 80-year-old male with fall.

EXAM:
CT HEAD WITHOUT CONTRAST
TECHNIQUE: Contiguous axial images were obtained from the base of the skull
through the vertex without intravenous contrast.

[Series 2: head wo · axial · 0.43mm/px · z∈[-113,+22]mm · 10 of 33 slices shown, 13 images]
[im 3/33  brain]
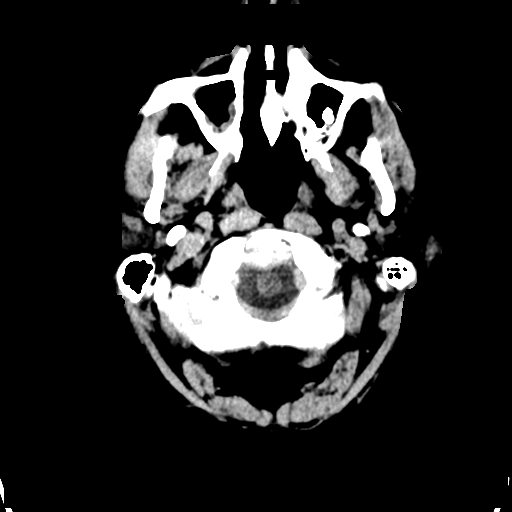
[im 3/33  bone]
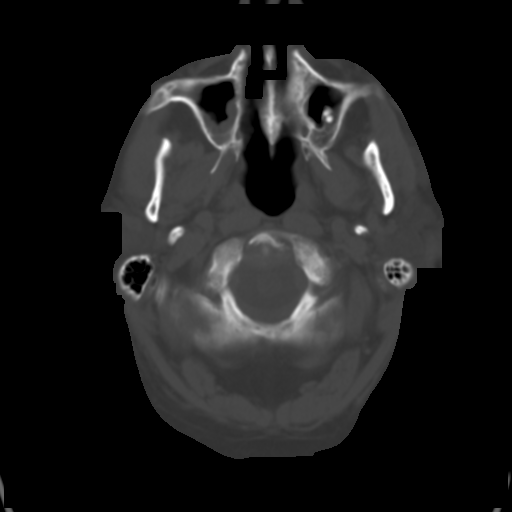
[im 6/33  brain]
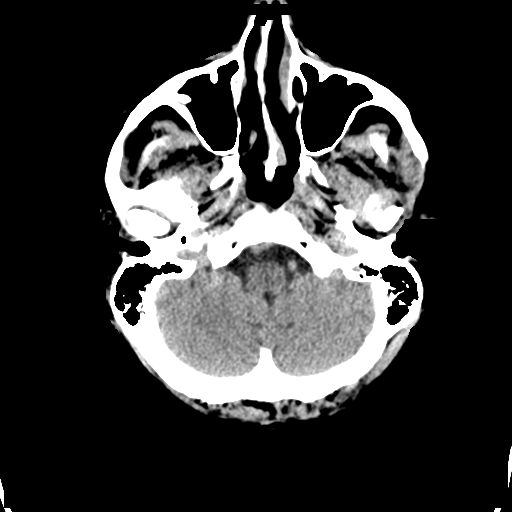
[im 9/33  brain]
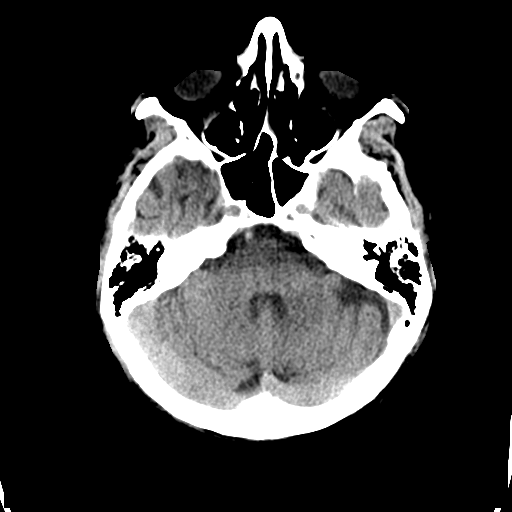
[im 12/33  brain]
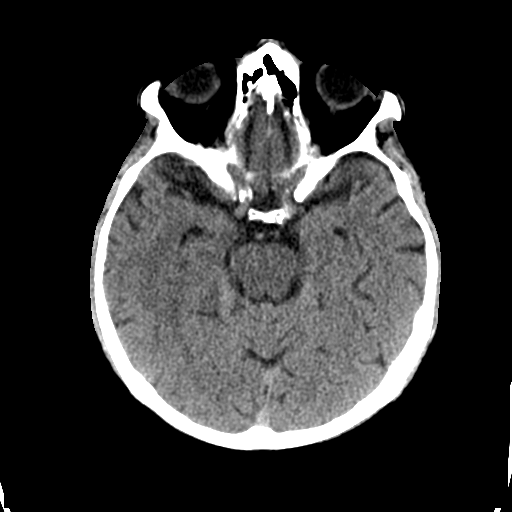
[im 15/33  brain]
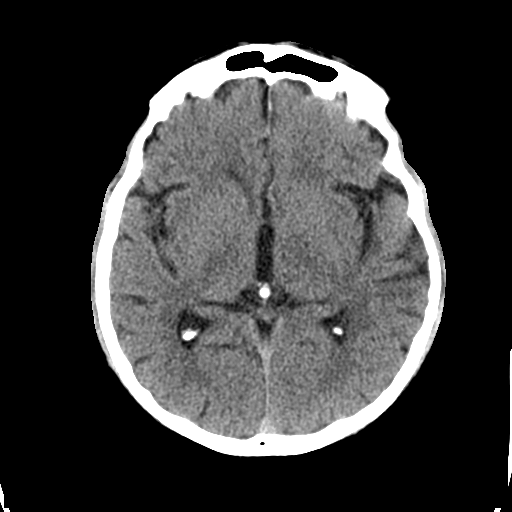
[im 15/33  bone]
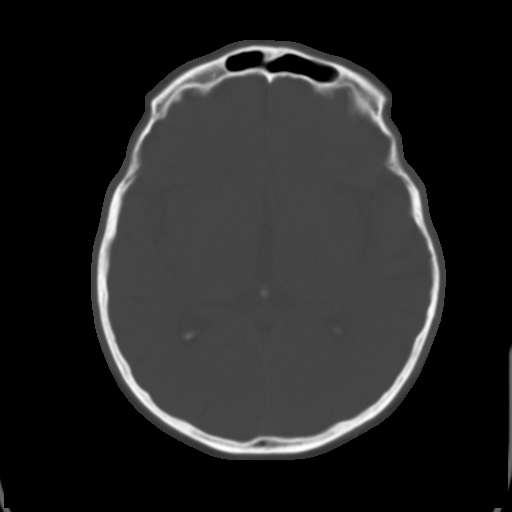
[im 18/33  brain]
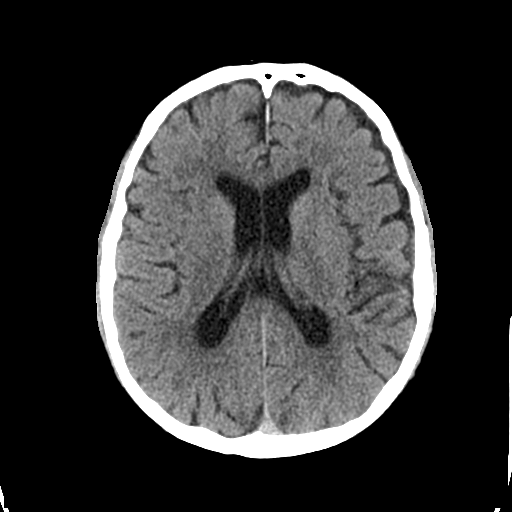
[im 21/33  brain]
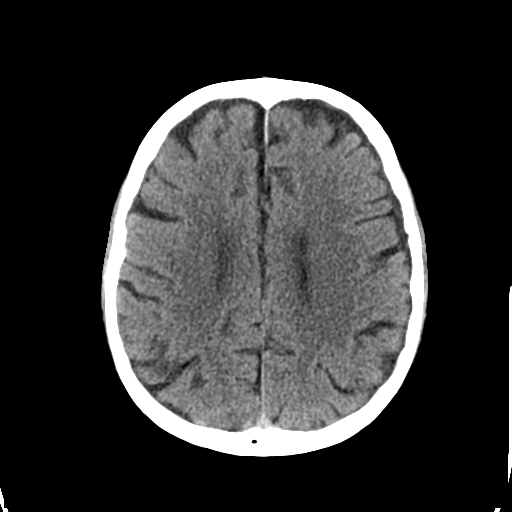
[im 25/33  brain]
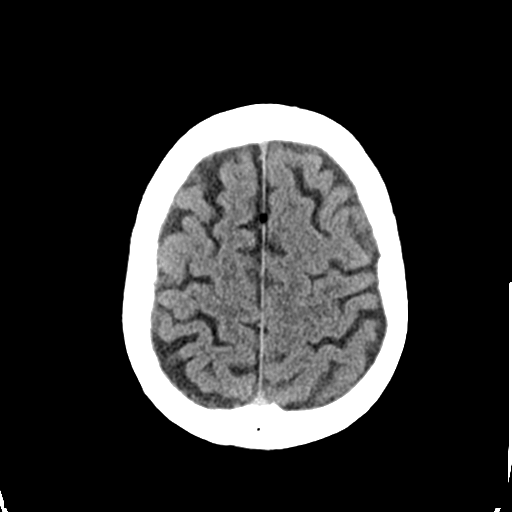
[im 27/33  brain]
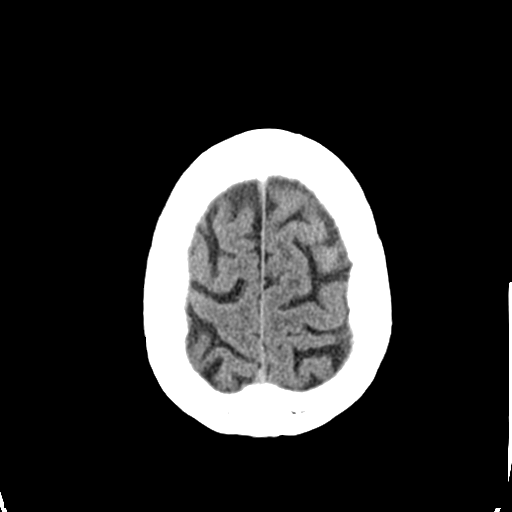
[im 27/33  bone]
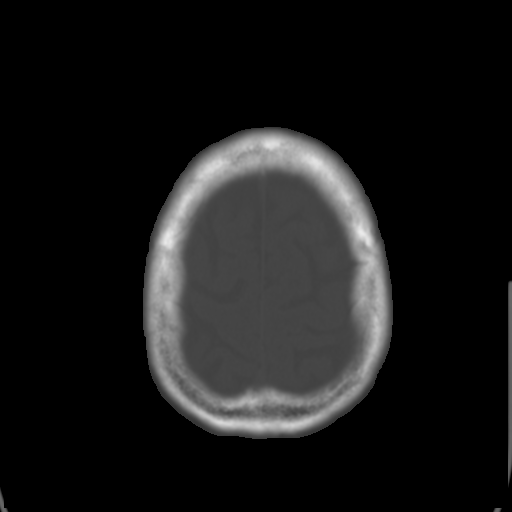
[im 30/33  brain]
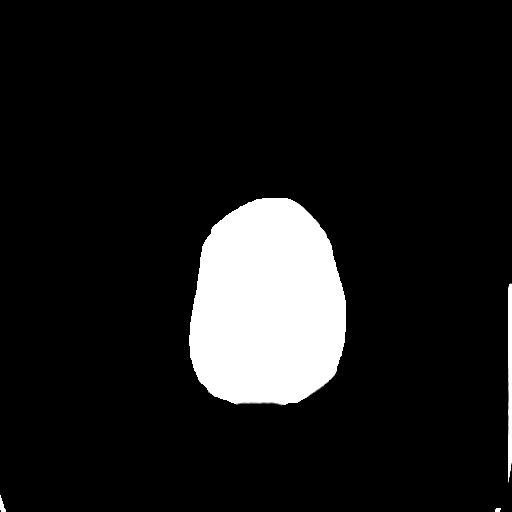

[Series 4: coronal soft tissue · coronal · 0.34mm/px · 3 of 73 slices shown]
[im 25/73  brain]
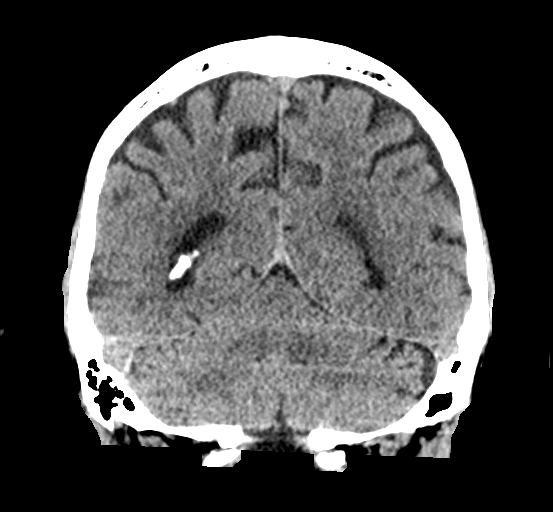
[im 33/73  brain]
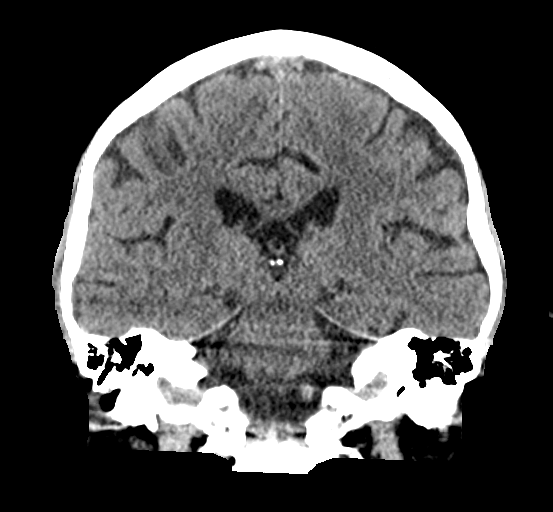
[im 41/73  brain]
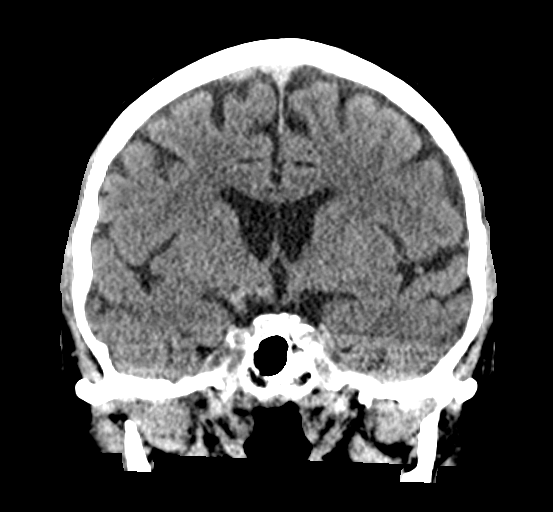

[Series 5: sagittal soft tissue · sagittal · 0.33mm/px · 3 of 62 slices shown]
[im 21/62  brain]
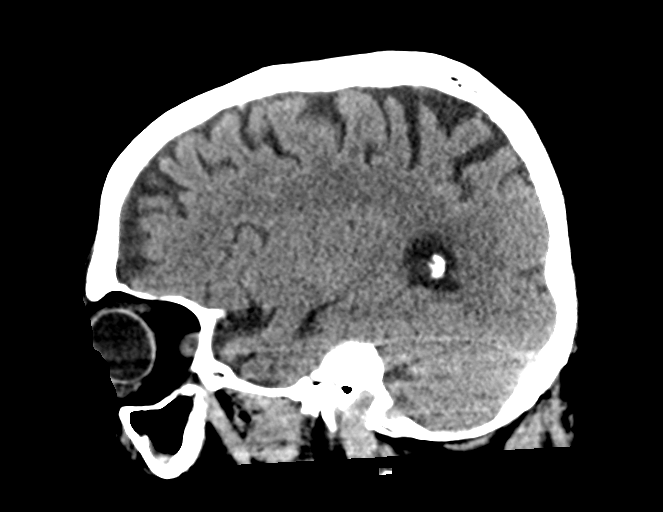
[im 31/62  brain]
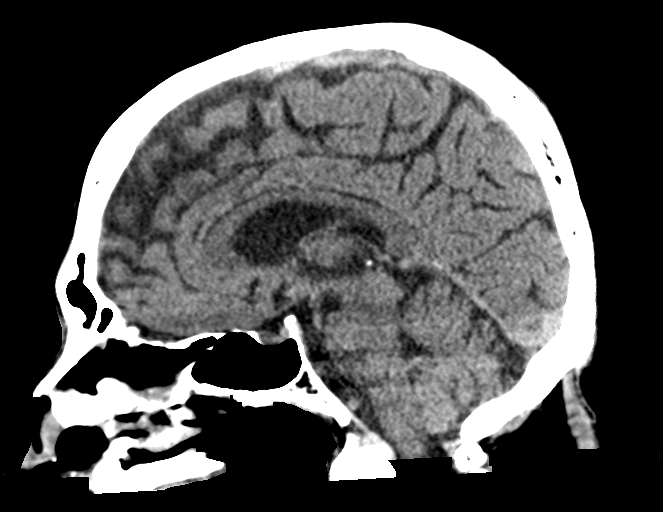
[im 41/62  brain]
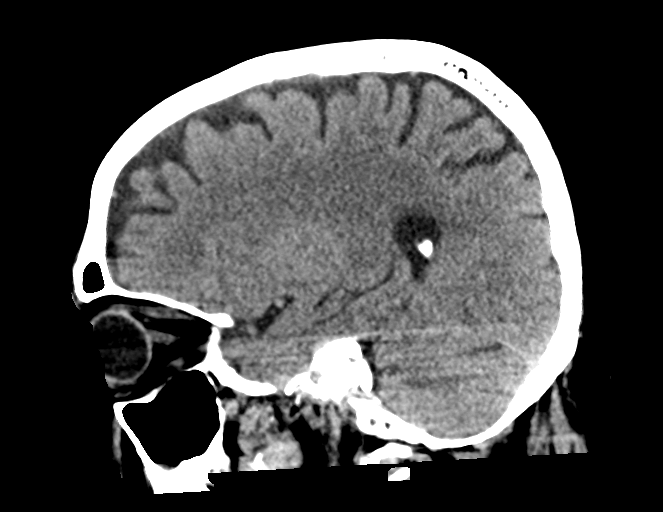

[16 of 47 positions shown; findings below may reference images not displayed]

FINDINGS: Brain: There is mild age-related atrophy and chronic microvascular
ischemic changes. There is no acute intracranial hemorrhage. No mass
effect or midline shift. No extra-axial fluid collection.

Vascular: No hyperdense vessel or unexpected calcification.

Skull: Normal. Negative for fracture or focal lesion.

Sinuses/Orbits: Mild mucoperiosteal thickening of paranasal sinuses.
No air-fluid level. There is perforation of the anterior nasal
septum. The mastoid air cells are clear.

Other: None
IMPRESSION: 1. No acute intracranial pathology.
2. Mild age-related atrophy and chronic microvascular ischemic
changes.

## 2022-06-16 DIAGNOSIS — M25512 Pain in left shoulder: Secondary | ICD-10-CM | POA: Diagnosis not present

## 2022-06-16 DIAGNOSIS — Z96612 Presence of left artificial shoulder joint: Secondary | ICD-10-CM | POA: Diagnosis not present

## 2022-06-19 DIAGNOSIS — H40002 Preglaucoma, unspecified, left eye: Secondary | ICD-10-CM | POA: Diagnosis not present

## 2022-06-19 DIAGNOSIS — H401111 Primary open-angle glaucoma, right eye, mild stage: Secondary | ICD-10-CM | POA: Diagnosis not present

## 2022-06-19 DIAGNOSIS — Z961 Presence of intraocular lens: Secondary | ICD-10-CM | POA: Diagnosis not present

## 2022-06-21 DIAGNOSIS — M25512 Pain in left shoulder: Secondary | ICD-10-CM | POA: Diagnosis not present

## 2022-06-21 DIAGNOSIS — Z96612 Presence of left artificial shoulder joint: Secondary | ICD-10-CM | POA: Diagnosis not present

## 2022-06-23 DIAGNOSIS — Z96612 Presence of left artificial shoulder joint: Secondary | ICD-10-CM | POA: Diagnosis not present

## 2022-06-26 DIAGNOSIS — Z96612 Presence of left artificial shoulder joint: Secondary | ICD-10-CM | POA: Diagnosis not present

## 2022-06-26 DIAGNOSIS — M25512 Pain in left shoulder: Secondary | ICD-10-CM | POA: Diagnosis not present

## 2022-06-30 ENCOUNTER — Other Ambulatory Visit: Payer: Self-pay | Admitting: *Deleted

## 2022-06-30 DIAGNOSIS — D509 Iron deficiency anemia, unspecified: Secondary | ICD-10-CM

## 2022-07-03 ENCOUNTER — Inpatient Hospital Stay: Payer: HMO | Attending: Oncology

## 2022-07-03 DIAGNOSIS — Z79899 Other long term (current) drug therapy: Secondary | ICD-10-CM | POA: Diagnosis not present

## 2022-07-03 DIAGNOSIS — E538 Deficiency of other specified B group vitamins: Secondary | ICD-10-CM | POA: Diagnosis not present

## 2022-07-03 DIAGNOSIS — E782 Mixed hyperlipidemia: Secondary | ICD-10-CM | POA: Diagnosis not present

## 2022-07-03 DIAGNOSIS — D509 Iron deficiency anemia, unspecified: Secondary | ICD-10-CM | POA: Diagnosis not present

## 2022-07-03 DIAGNOSIS — Z125 Encounter for screening for malignant neoplasm of prostate: Secondary | ICD-10-CM | POA: Diagnosis not present

## 2022-07-03 DIAGNOSIS — R030 Elevated blood-pressure reading, without diagnosis of hypertension: Secondary | ICD-10-CM | POA: Diagnosis not present

## 2022-07-03 LAB — CBC WITH DIFFERENTIAL/PLATELET
Abs Immature Granulocytes: 0.01 10*3/uL (ref 0.00–0.07)
Basophils Absolute: 0 10*3/uL (ref 0.0–0.1)
Basophils Relative: 1 %
Eosinophils Absolute: 0.2 10*3/uL (ref 0.0–0.5)
Eosinophils Relative: 3 %
HCT: 33.5 % — ABNORMAL LOW (ref 39.0–52.0)
Hemoglobin: 10.5 g/dL — ABNORMAL LOW (ref 13.0–17.0)
Immature Granulocytes: 0 %
Lymphocytes Relative: 16 %
Lymphs Abs: 0.9 10*3/uL (ref 0.7–4.0)
MCH: 29.3 pg (ref 26.0–34.0)
MCHC: 31.3 g/dL (ref 30.0–36.0)
MCV: 93.6 fL (ref 80.0–100.0)
Monocytes Absolute: 0.6 10*3/uL (ref 0.1–1.0)
Monocytes Relative: 12 %
Neutro Abs: 3.7 10*3/uL (ref 1.7–7.7)
Neutrophils Relative %: 68 %
Platelets: 198 10*3/uL (ref 150–400)
RBC: 3.58 MIL/uL — ABNORMAL LOW (ref 4.22–5.81)
RDW: 14.9 % (ref 11.5–15.5)
WBC: 5.4 10*3/uL (ref 4.0–10.5)
nRBC: 0 % (ref 0.0–0.2)

## 2022-07-03 LAB — IRON AND TIBC
Iron: 29 ug/dL — ABNORMAL LOW (ref 45–182)
Saturation Ratios: 6 % — ABNORMAL LOW (ref 17.9–39.5)
TIBC: 497 ug/dL — ABNORMAL HIGH (ref 250–450)
UIBC: 468 ug/dL

## 2022-07-03 LAB — VITAMIN B12: Vitamin B-12: 337 pg/mL (ref 180–914)

## 2022-07-03 LAB — FERRITIN: Ferritin: 10 ng/mL — ABNORMAL LOW (ref 24–336)

## 2022-07-05 DIAGNOSIS — M25512 Pain in left shoulder: Secondary | ICD-10-CM | POA: Diagnosis not present

## 2022-07-05 DIAGNOSIS — Z96612 Presence of left artificial shoulder joint: Secondary | ICD-10-CM | POA: Diagnosis not present

## 2022-07-05 MED FILL — Ferumoxytol Inj 510 MG/17ML (30 MG/ML) (Elemental Fe): INTRAVENOUS | Qty: 17 | Status: AC

## 2022-07-06 ENCOUNTER — Inpatient Hospital Stay: Payer: HMO

## 2022-07-06 ENCOUNTER — Encounter: Payer: Self-pay | Admitting: Oncology

## 2022-07-06 ENCOUNTER — Inpatient Hospital Stay (HOSPITAL_BASED_OUTPATIENT_CLINIC_OR_DEPARTMENT_OTHER): Payer: HMO | Admitting: Oncology

## 2022-07-06 VITALS — BP 114/78 | HR 72 | Resp 18

## 2022-07-06 VITALS — BP 123/67 | HR 84 | Temp 98.4°F | Resp 18 | Ht 66.0 in | Wt 199.2 lb

## 2022-07-06 DIAGNOSIS — D509 Iron deficiency anemia, unspecified: Secondary | ICD-10-CM | POA: Diagnosis not present

## 2022-07-06 DIAGNOSIS — E538 Deficiency of other specified B group vitamins: Secondary | ICD-10-CM | POA: Diagnosis not present

## 2022-07-06 MED ORDER — SODIUM CHLORIDE 0.9 % IV SOLN
Freq: Once | INTRAVENOUS | Status: AC
  Filled 2022-07-06: qty 250

## 2022-07-06 MED ORDER — SODIUM CHLORIDE 0.9 % IV SOLN
510.0000 mg | Freq: Once | INTRAVENOUS | Status: AC
  Administered 2022-07-06: 510 mg via INTRAVENOUS
  Filled 2022-07-06: qty 510

## 2022-07-06 NOTE — Progress Notes (Signed)
Wife states patient has not been feeling very well. Has been having bilateral leg swelling but has not been taking lasix daily.

## 2022-07-06 NOTE — Progress Notes (Signed)
[facility]  Telephone:(336) [phone] Fax:(336) [phone]  ID: Marcus Coleman OB: June 10, 1940  MR#: [id number]  AOZ#:[id number]  Patient Care Team: [name], MD as PCP - General (Internal Medicine) Jeralyn Ruths, MD as Consulting Physician (Hematology and Oncology)  CHIEF COMPLAINT: Iron deficiency anemia.  INTERVAL HISTORY: Patient returns to clinic today for repeat laboratory work, further evaluation, consideration of additional IV Feraheme.  He is noted over the past 1 to 2 weeks that his weakness and fatigue have become worse.  He also has increased bilateral lower extremity peripheral edema.  He otherwise feels well.  He has no neurologic complaints.  He denies any recent fevers or illnesses.  He has a good appetite and denies weight loss.  He denies any chest pain, shortness of breath, cough, or hemoptysis.  He denies any nausea, vomiting, constipation, or diarrhea.  He has no melena or hematochezia.  He has no urinary complaints.  Patient offers no further specific complaints today.  REVIEW OF SYSTEMS:   Review of Systems  Constitutional:  Positive for malaise/fatigue. Negative for fever and weight loss.  Respiratory: Negative.  Negative for cough, hemoptysis and shortness of breath.   Cardiovascular:  Positive for leg swelling. Negative for chest pain.  Gastrointestinal: Negative.  Negative for abdominal pain, blood in stool and melena.  Genitourinary: Negative.  Negative for hematuria.  Musculoskeletal: Negative.  Negative for back pain.  Skin: Negative.  Negative for rash.  Neurological:  Positive for weakness. Negative for dizziness, focal weakness and headaches.  Psychiatric/Behavioral: Negative.  The patient is not nervous/anxious.     As per HPI. Otherwise, a complete review of systems is negative.  PAST MEDICAL HISTORY: Past Medical History:  Diagnosis Date   Aortic atherosclerosis    Atrial fibrillation    a.) CHA2DS2VASc = 6 (age x2,  HTN, DVT x2, vascular disease history);  b.) rate/rhythm maintained on oral diltiazem + metoprolol succinate; chronic anticoagulation (apixaban) discontinued due to GI bleeding   BPH with obstruction/lower urinary tract symptoms    Bronchitis 01/27/2022   CAD (coronary artery disease)    Cardiomegaly    Chronic diarrhea    Compression fracture of thoracic vertebra    a.) CT renal 12/30/2021: chronic appearing T11-T12 (70% height loss)   DDD (degenerative disc disease), cervical    Diverticulosis    DVT (deep venous thrombosis) 03/31/2019   a.) anticoagulation intiated (apixaban), however subsequently discontinued due to GI bleeding   ETOH abuse    GI bleed    Heart murmur    Hiatal hernia    Hyperlipidemia    Hypertension    IDA (iron deficiency anemia)    Low testosterone    Nephrolithiasis    Pneumonia    Rotator cuff arthropathy, left    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    Vasovagal syncope     PAST SURGICAL HISTORY: Past Surgical History:  Procedure Laterality Date   boils removed from right forearm  1968   CARDIAC CATHETERIZATION     CATARACT EXTRACTION Left 2008   catracts Bilateral    left 2008, right 2013   CHOLECYSTECTOMY     COLONOSCOPY WITH PROPOFOL N/A 06/26/2016   Procedure: COLONOSCOPY WITH PROPOFOL;  Surgeon: Scot Jun, MD;  Location: [facility] ENDOSCOPY;  Service: Endoscopy;  Laterality: N/A;   COLONOSCOPY WITH PROPOFOL N/A 07/03/2019   Procedure: COLONOSCOPY WITH PROPOFOL;  Surgeon: Wyline Mood, MD;  Location: [facility] ENDOSCOPY;  Service: Gastroenterology;  Laterality: N/A;  ENDOSCOPIC RETROGRADE CHOLANGIOPANCREATOGRAPHY (ERCP) WITH PROPOFOL     ENDOSCOPIC VEIN LASER TREATMENT     right leg 05/2015, left leg 09/2015   ESOPHAGOGASTRODUODENOSCOPY N/A 04/19/2019   Procedure: ESOPHAGOGASTRODUODENOSCOPY (EGD);  Surgeon: Wyline Mood, MD;  Location: [facility] ENDOSCOPY;  Service: Gastroenterology;  Laterality: N/A;   ESOPHAGOGASTRODUODENOSCOPY (EGD) WITH PROPOFOL  N/A 07/03/2019   Procedure: ESOPHAGOGASTRODUODENOSCOPY (EGD) WITH PROPOFOL;  Surgeon: Wyline Mood, MD;  Location: [facility] ENDOSCOPY;  Service: Gastroenterology;  Laterality: N/A;   ESOPHAGOGASTRODUODENOSCOPY (EGD) WITH PROPOFOL N/A 08/07/2019   Procedure: ESOPHAGOGASTRODUODENOSCOPY (EGD) WITH PROPOFOL;  Surgeon: Rachael Fee, MD;  Location: [facility] ENDOSCOPY;  Service: Endoscopy;  Laterality: N/A;   EUS N/A 08/07/2019   Procedure: UPPER ENDOSCOPIC ULTRASOUND (EUS) RADIAL;  Surgeon: Rachael Fee, MD;  Location: [facility] ENDOSCOPY;  Service: Endoscopy;  Laterality: N/A;   eyelid surgery     FOOT SURGERY Right 2009   HEMORROIDECTOMY     HERNIA REPAIR     mrercp  2014   NOSE SURGERY  2011   REVERSE SHOULDER ARTHROPLASTY Left 02/09/2022   Procedure: REVERSE SHOULDER ARTHROPLASTY AND BICEPS TENODESIS.;  Surgeon: Christena Flake, MD;  Location: [facility] ORS;  Service: Orthopedics;  Laterality: Left;   RHINOPLASTY     squamous cell carcinoma removed from right hand  2013   TONSILLECTOMY     WISDOM TOOTH EXTRACTION      FAMILY HISTORY: Family History  Problem Relation Age of Onset   Hypothyroidism Mother    Diabetes Mother    Lung cancer Mother     ADVANCED DIRECTIVES (Y/N):  N  HEALTH MAINTENANCE: Social History   Tobacco Use   Smoking status: Former    Packs/day: 1.00    Years: 25.00    Additional pack years: 0.00    Total pack years: 25.00    Types: Cigarettes    Quit date: 12/05/1986    Years since quitting: 35.6   Smokeless tobacco: Never  Vaping Use   Vaping Use: Never used  Substance Use Topics   Alcohol use: Yes    Alcohol/week: 14.0 standard drinks of alcohol    Types: 14 Cans of beer per week    Comment: 2 beers per dayOR  a couple glasses of wine   Drug use: No     Colonoscopy:  PAP:  Bone density:  Lipid panel:  Allergies  Allergen Reactions   Nsaids     Can take Ibuprofen in moderation/high doses or prolonged use causes GI bleed    Current Outpatient Medications   Medication Sig Dispense Refill   acetaminophen (TYLENOL) 650 MG CR tablet Take 650 mg by mouth every 8 (eight) hours as needed for pain.     cetirizine (ZYRTEC) 10 MG tablet Take 10 mg by mouth every morning.     clindamycin (CLEOCIN T) 1 % external solution Apply 1 % topically as needed. To head     diltiazem (CARDIZEM) 30 MG tablet Take 1 tablet (30 mg total) by mouth every 12 (twelve) hours. 60 tablet 0   fluticasone (FLONASE) 50 MCG/ACT nasal spray Place 1 spray into both nostrils daily. 9.9 mL 0   furosemide (LASIX) 20 MG tablet Take 40 mg by mouth every morning.     pantoprazole (PROTONIX) 40 MG tablet Take 1 tablet (40 mg total) by mouth daily. 30 tablet 0   sodium chloride (OCEAN) 0.65 % SOLN nasal spray Place 1 spray into both nostrils as needed for congestion. 15 mL 0   tamsulosin (FLOMAX) 0.4 MG CAPS  capsule Take 1 capsule (0.4 mg total) by mouth daily. 30 capsule 0   timolol (TIMOPTIC) 0.5 % ophthalmic solution Place 1 drop into both eyes daily.      HYDROcodone-acetaminophen (NORCO/VICODIN) 5-325 MG tablet Take 1-2 tablets by mouth every 6 (six) hours as needed for moderate pain or severe pain. (Patient not taking: Reported on 02/27/2022) 30 tablet 0   No current facility-administered medications for this visit.    OBJECTIVE: Vitals:   07/06/22 1450  BP: 123/67  Pulse: 84  Resp: 18  Temp: 98.4 F (36.9 C)  SpO2: 97%     Body mass index is 32.15 kg/m.    ECOG FS:1 - Symptomatic but completely ambulatory  General: Well-developed, well-nourished, no acute distress. Eyes: Pink conjunctiva, anicteric sclera. HEENT: Normocephalic, moist mucous membranes. Lungs: No audible wheezing or coughing. Heart: Regular rate and rhythm. Abdomen: Soft, nontender, no obvious distention. Musculoskeletal: No edema, cyanosis, or clubbing. Neuro: Alert, answering all questions appropriately. Cranial nerves grossly intact. Skin: No rashes or petechiae noted. Psych: Normal affect.  LAB  RESULTS:  Lab Results  Component Value Date   NA 139 02/27/2022   K 3.6 02/27/2022   CL 100 02/27/2022   CO2 29 02/27/2022   GLUCOSE 79 02/27/2022   BUN 24 (H) 02/27/2022   CREATININE 0.66 02/27/2022   CALCIUM 8.8 (L) 02/27/2022   PROT 7.1 02/27/2022   ALBUMIN 3.4 (L) 02/27/2022   AST 20 02/27/2022   ALT 13 02/27/2022   ALKPHOS 100 02/27/2022   BILITOT 0.6 02/27/2022   GFRNONAA >60 02/27/2022   GFRAA >60 04/21/2019    Lab Results  Component Value Date   WBC 5.4 07/03/2022   NEUTROABS 3.7 07/03/2022   HGB 10.5 (L) 07/03/2022   HCT 33.5 (L) 07/03/2022   MCV 93.6 07/03/2022   PLT 198 07/03/2022   Lab Results  Component Value Date   IRON 29 (L) 07/03/2022   TIBC 497 (H) 07/03/2022   IRONPCTSAT 6 (L) 07/03/2022   Lab Results  Component Value Date   FERRITIN 10 (L) 07/03/2022     STUDIES: No results found.  ASSESSMENT: Iron deficiency anemia  Iron deficiency anemia: Patient's hemoglobin and iron stores have trended down and he is also symptomatic today.  Previously, patient had a normal colonoscopy on June 26, 2016.  His most recent EGD on Aug 07, 2019 did not reveal any significant pathology.  Proceed with 510 mg IV Feraheme today.  Return to clinic next week for second infusion.  Patient will then return to clinic in 3 months with repeat laboratory work, further evaluation, and continuation of treatment if needed.      B12 deficiency: Resolved.  Patient last received treatment on Jul 26, 2021. Peripheral edema: Patient has been instructed to take his Lasix as prescribed.  Continue elevation as directed.  I spent a total of 30 minutes reviewing chart data, face-to-face evaluation with the patient, counseling and coordination of care as detailed above.   Patient expressed understanding and was in agreement with this plan. He also understands that He can call clinic at any time with any questions, concerns, or complaints.    Jeralyn Ruths, MD   07/06/2022 3:12  PM

## 2022-07-07 DIAGNOSIS — M25512 Pain in left shoulder: Secondary | ICD-10-CM | POA: Diagnosis not present

## 2022-07-07 DIAGNOSIS — Z96612 Presence of left artificial shoulder joint: Secondary | ICD-10-CM | POA: Diagnosis not present

## 2022-07-10 DIAGNOSIS — E6609 Other obesity due to excess calories: Secondary | ICD-10-CM | POA: Diagnosis not present

## 2022-07-10 DIAGNOSIS — E782 Mixed hyperlipidemia: Secondary | ICD-10-CM | POA: Diagnosis not present

## 2022-07-10 DIAGNOSIS — R030 Elevated blood-pressure reading, without diagnosis of hypertension: Secondary | ICD-10-CM | POA: Diagnosis not present

## 2022-07-10 DIAGNOSIS — M5416 Radiculopathy, lumbar region: Secondary | ICD-10-CM | POA: Diagnosis not present

## 2022-07-10 DIAGNOSIS — D509 Iron deficiency anemia, unspecified: Secondary | ICD-10-CM | POA: Diagnosis not present

## 2022-07-10 DIAGNOSIS — Z6831 Body mass index (BMI) 31.0-31.9, adult: Secondary | ICD-10-CM | POA: Diagnosis not present

## 2022-07-12 DIAGNOSIS — Z96612 Presence of left artificial shoulder joint: Secondary | ICD-10-CM | POA: Diagnosis not present

## 2022-07-12 DIAGNOSIS — M25512 Pain in left shoulder: Secondary | ICD-10-CM | POA: Diagnosis not present

## 2022-07-13 ENCOUNTER — Inpatient Hospital Stay: Payer: HMO | Attending: Oncology

## 2022-07-13 VITALS — BP 125/73 | HR 85 | Temp 97.5°F | Resp 16

## 2022-07-13 DIAGNOSIS — D509 Iron deficiency anemia, unspecified: Secondary | ICD-10-CM | POA: Diagnosis not present

## 2022-07-13 MED ORDER — SODIUM CHLORIDE 0.9 % IV SOLN
Freq: Once | INTRAVENOUS | Status: AC
  Filled 2022-07-13: qty 250

## 2022-07-13 MED ORDER — SODIUM CHLORIDE 0.9 % IV SOLN
510.0000 mg | Freq: Once | INTRAVENOUS | Status: AC
  Administered 2022-07-13: 510 mg via INTRAVENOUS
  Filled 2022-07-13: qty 510

## 2022-07-14 DIAGNOSIS — E8779 Other fluid overload: Principal | ICD-10-CM | POA: Diagnosis present

## 2022-07-14 DIAGNOSIS — D509 Iron deficiency anemia, unspecified: Secondary | ICD-10-CM | POA: Diagnosis present

## 2022-07-14 DIAGNOSIS — N401 Enlarged prostate with lower urinary tract symptoms: Secondary | ICD-10-CM | POA: Diagnosis present

## 2022-07-14 DIAGNOSIS — F1010 Alcohol abuse, uncomplicated: Secondary | ICD-10-CM | POA: Diagnosis present

## 2022-07-14 DIAGNOSIS — Z833 Family history of diabetes mellitus: Secondary | ICD-10-CM

## 2022-07-14 DIAGNOSIS — M5136 Other intervertebral disc degeneration, lumbar region: Secondary | ICD-10-CM | POA: Diagnosis present

## 2022-07-14 DIAGNOSIS — K219 Gastro-esophageal reflux disease without esophagitis: Secondary | ICD-10-CM | POA: Diagnosis present

## 2022-07-14 DIAGNOSIS — W19XXXA Unspecified fall, initial encounter: Secondary | ICD-10-CM | POA: Diagnosis not present

## 2022-07-14 DIAGNOSIS — M542 Cervicalgia: Secondary | ICD-10-CM | POA: Diagnosis not present

## 2022-07-14 DIAGNOSIS — M4854XA Collapsed vertebra, not elsewhere classified, thoracic region, initial encounter for fracture: Secondary | ICD-10-CM | POA: Diagnosis present

## 2022-07-14 DIAGNOSIS — Z96612 Presence of left artificial shoulder joint: Secondary | ICD-10-CM | POA: Diagnosis present

## 2022-07-14 DIAGNOSIS — Z801 Family history of malignant neoplasm of trachea, bronchus and lung: Secondary | ICD-10-CM

## 2022-07-14 DIAGNOSIS — I4819 Other persistent atrial fibrillation: Secondary | ICD-10-CM | POA: Diagnosis present

## 2022-07-14 DIAGNOSIS — Z87442 Personal history of urinary calculi: Secondary | ICD-10-CM

## 2022-07-14 DIAGNOSIS — T501X6A Underdosing of loop [high-ceiling] diuretics, initial encounter: Secondary | ICD-10-CM | POA: Diagnosis present

## 2022-07-14 DIAGNOSIS — G8929 Other chronic pain: Secondary | ICD-10-CM | POA: Diagnosis present

## 2022-07-14 DIAGNOSIS — M549 Dorsalgia, unspecified: Secondary | ICD-10-CM | POA: Diagnosis not present

## 2022-07-14 DIAGNOSIS — Z1152 Encounter for screening for COVID-19: Secondary | ICD-10-CM

## 2022-07-14 DIAGNOSIS — I10 Essential (primary) hypertension: Secondary | ICD-10-CM | POA: Diagnosis present

## 2022-07-14 DIAGNOSIS — G8911 Acute pain due to trauma: Secondary | ICD-10-CM | POA: Diagnosis present

## 2022-07-14 DIAGNOSIS — R0902 Hypoxemia: Secondary | ICD-10-CM | POA: Diagnosis present

## 2022-07-14 DIAGNOSIS — M419 Scoliosis, unspecified: Secondary | ICD-10-CM | POA: Diagnosis present

## 2022-07-14 DIAGNOSIS — R110 Nausea: Secondary | ICD-10-CM | POA: Diagnosis not present

## 2022-07-14 DIAGNOSIS — I4891 Unspecified atrial fibrillation: Secondary | ICD-10-CM | POA: Diagnosis not present

## 2022-07-14 DIAGNOSIS — Z86718 Personal history of other venous thrombosis and embolism: Secondary | ICD-10-CM

## 2022-07-14 DIAGNOSIS — Z91128 Patient's intentional underdosing of medication regimen for other reason: Secondary | ICD-10-CM

## 2022-07-14 DIAGNOSIS — K5900 Constipation, unspecified: Secondary | ICD-10-CM | POA: Diagnosis present

## 2022-07-14 DIAGNOSIS — Z9049 Acquired absence of other specified parts of digestive tract: Secondary | ICD-10-CM

## 2022-07-14 DIAGNOSIS — Z87891 Personal history of nicotine dependence: Secondary | ICD-10-CM

## 2022-07-14 DIAGNOSIS — Z7901 Long term (current) use of anticoagulants: Secondary | ICD-10-CM

## 2022-07-14 DIAGNOSIS — Z79899 Other long term (current) drug therapy: Secondary | ICD-10-CM

## 2022-07-14 DIAGNOSIS — I2510 Atherosclerotic heart disease of native coronary artery without angina pectoris: Secondary | ICD-10-CM | POA: Diagnosis present

## 2022-07-14 DIAGNOSIS — M545 Low back pain, unspecified: Secondary | ICD-10-CM | POA: Diagnosis not present

## 2022-07-14 DIAGNOSIS — M48061 Spinal stenosis, lumbar region without neurogenic claudication: Secondary | ICD-10-CM | POA: Diagnosis present

## 2022-07-14 DIAGNOSIS — E785 Hyperlipidemia, unspecified: Secondary | ICD-10-CM | POA: Diagnosis present

## 2022-07-14 DIAGNOSIS — R600 Localized edema: Secondary | ICD-10-CM | POA: Diagnosis present

## 2022-07-14 NOTE — ED Triage Notes (Signed)
First Nurse note: pt BIB ems from home.  Per ems, pt may have fallen asleep in his office chair, and fell out.  Pt denies hx of seizures, and denies remembering the fall.  Pt c/o lower back and neck pain on arrival.

## 2022-07-15 ENCOUNTER — Observation Stay: Payer: No Typology Code available for payment source

## 2022-07-15 ENCOUNTER — Inpatient Hospital Stay
Admission: EM | Admit: 2022-07-15 | Discharge: 2022-07-21 | DRG: 641 | Disposition: A | Discharge status: Skilled Nursing Facility | Payer: No Typology Code available for payment source | Attending: Internal Medicine | Admitting: Internal Medicine

## 2022-07-15 ENCOUNTER — Other Ambulatory Visit: Payer: Self-pay

## 2022-07-15 ENCOUNTER — Emergency Department: Payer: No Typology Code available for payment source

## 2022-07-15 DIAGNOSIS — F1010 Alcohol abuse, uncomplicated: Secondary | ICD-10-CM | POA: Diagnosis present

## 2022-07-15 DIAGNOSIS — W19XXXA Unspecified fall, initial encounter: Secondary | ICD-10-CM | POA: Diagnosis not present

## 2022-07-15 DIAGNOSIS — M545 Low back pain, unspecified: Secondary | ICD-10-CM | POA: Diagnosis not present

## 2022-07-15 DIAGNOSIS — N401 Enlarged prostate with lower urinary tract symptoms: Secondary | ICD-10-CM | POA: Diagnosis present

## 2022-07-15 DIAGNOSIS — I2510 Atherosclerotic heart disease of native coronary artery without angina pectoris: Secondary | ICD-10-CM | POA: Diagnosis present

## 2022-07-15 DIAGNOSIS — I10 Essential (primary) hypertension: Secondary | ICD-10-CM | POA: Diagnosis present

## 2022-07-15 DIAGNOSIS — I4819 Other persistent atrial fibrillation: Secondary | ICD-10-CM | POA: Diagnosis present

## 2022-07-15 DIAGNOSIS — M549 Dorsalgia, unspecified: Secondary | ICD-10-CM | POA: Diagnosis present

## 2022-07-15 DIAGNOSIS — D509 Iron deficiency anemia, unspecified: Secondary | ICD-10-CM | POA: Diagnosis not present

## 2022-07-15 DIAGNOSIS — R52 Pain, unspecified: Secondary | ICD-10-CM

## 2022-07-15 DIAGNOSIS — R0902 Hypoxemia: Secondary | ICD-10-CM

## 2022-07-15 DIAGNOSIS — R110 Nausea: Secondary | ICD-10-CM | POA: Insufficient documentation

## 2022-07-15 DIAGNOSIS — R600 Localized edema: Secondary | ICD-10-CM | POA: Insufficient documentation

## 2022-07-15 DIAGNOSIS — Z86718 Personal history of other venous thrombosis and embolism: Secondary | ICD-10-CM

## 2022-07-15 DIAGNOSIS — K5900 Constipation, unspecified: Secondary | ICD-10-CM | POA: Insufficient documentation

## 2022-07-15 LAB — CBC
HCT: 36.7 % — ABNORMAL LOW (ref 39.0–52.0)
Hemoglobin: 11.2 g/dL — ABNORMAL LOW (ref 13.0–17.0)
MCH: 29.2 pg (ref 26.0–34.0)
MCHC: 30.5 g/dL (ref 30.0–36.0)
MCV: 95.8 fL (ref 80.0–100.0)
Platelets: 200 10*3/uL (ref 150–400)
RBC: 3.83 MIL/uL — ABNORMAL LOW (ref 4.22–5.81)
RDW: 18 % — ABNORMAL HIGH (ref 11.5–15.5)
WBC: 5.7 10*3/uL (ref 4.0–10.5)
nRBC: 0 % (ref 0.0–0.2)

## 2022-07-15 LAB — BRAIN NATRIURETIC PEPTIDE: B Natriuretic Peptide: 386.8 pg/mL — ABNORMAL HIGH (ref 0.0–100.0)

## 2022-07-15 LAB — SARS CORONAVIRUS 2 BY RT PCR: SARS Coronavirus 2 by RT PCR: NEGATIVE

## 2022-07-15 LAB — COMPREHENSIVE METABOLIC PANEL
ALT: 11 U/L (ref 0–44)
AST: 19 U/L (ref 15–41)
Albumin: 3.5 g/dL (ref 3.5–5.0)
Alkaline Phosphatase: 69 U/L (ref 38–126)
Anion gap: 11 (ref 5–15)
BUN: 17 mg/dL (ref 8–23)
CO2: 26 mmol/L (ref 22–32)
Calcium: 8.9 mg/dL (ref 8.9–10.3)
Chloride: 102 mmol/L (ref 98–111)
Creatinine, Ser: 0.63 mg/dL (ref 0.61–1.24)
GFR, Estimated: >60 mL/min (ref >60)
Glucose, Bld: 85 mg/dL (ref 70–99)
Potassium: 3.7 mmol/L (ref 3.5–5.1)
Sodium: 139 mmol/L (ref 135–145)
Total Bilirubin: 0.7 mg/dL (ref 0.3–1.2)
Total Protein: 6.5 g/dL (ref 6.5–8.1)

## 2022-07-15 LAB — TROPONIN I (HIGH SENSITIVITY): Troponin I (High Sensitivity): 10 ng/L (ref <18)

## 2022-07-15 MED ORDER — METHOCARBAMOL 500 MG PO TABS
500.0000 mg | ORAL_TABLET | Freq: Three times a day (TID) | ORAL | Status: DC | PRN
  Administered 2022-07-15 – 2022-07-18 (×3): 500 mg via ORAL
  Filled 2022-07-15 (×4): qty 1

## 2022-07-15 MED ORDER — LIDOCAINE 5 % EX PTCH
1.0000 | MEDICATED_PATCH | CUTANEOUS | Status: DC
  Administered 2022-07-15 – 2022-07-21 (×7): 1 via TRANSDERMAL
  Filled 2022-07-15 (×7): qty 1

## 2022-07-15 MED ORDER — ACETAMINOPHEN 325 MG PO TABS
650.0000 mg | ORAL_TABLET | Freq: Four times a day (QID) | ORAL | Status: DC | PRN
  Administered 2022-07-15: 650 mg via ORAL
  Filled 2022-07-15: qty 2

## 2022-07-15 MED ORDER — NYSTATIN 100000 UNIT/GM EX CREA
TOPICAL_CREAM | Freq: Two times a day (BID) | CUTANEOUS | Status: DC
  Filled 2022-07-15 (×2): qty 30

## 2022-07-15 MED ORDER — TIMOLOL MALEATE 0.5 % OP SOLN
1.0000 [drp] | Freq: Every day | OPHTHALMIC | Status: DC
  Administered 2022-07-15 – 2022-07-21 (×7): 1 [drp] via OPHTHALMIC
  Filled 2022-07-15 (×2): qty 5

## 2022-07-15 MED ORDER — LORAZEPAM 2 MG/ML IJ SOLN: 1.0000 mg | INTRAMUSCULAR | Status: DC | PRN

## 2022-07-15 MED ORDER — ALBUTEROL SULFATE (2.5 MG/3ML) 0.083% IN NEBU: 2.5000 mg | INHALATION_SOLUTION | RESPIRATORY_TRACT | Status: DC | PRN

## 2022-07-15 MED ORDER — DM-GUAIFENESIN ER 30-600 MG PO TB12: 1.0000 | ORAL_TABLET | Freq: Two times a day (BID) | ORAL | Status: DC | PRN

## 2022-07-15 MED ORDER — ONDANSETRON 4 MG PO TBDP
4.0000 mg | ORAL_TABLET | Freq: Once | ORAL | Status: AC
  Administered 2022-07-15: 4 mg via ORAL
  Filled 2022-07-15: qty 1

## 2022-07-15 MED ORDER — ADULT MULTIVITAMIN W/MINERALS CH
1.0000 | ORAL_TABLET | Freq: Every day | ORAL | Status: DC
  Administered 2022-07-15 – 2022-07-21 (×7): 1 via ORAL
  Filled 2022-07-15 (×7): qty 1

## 2022-07-15 MED ORDER — FLUTICASONE PROPIONATE 50 MCG/ACT NA SUSP: 1.0000 | Freq: Every day | NASAL | Status: DC | PRN

## 2022-07-15 MED ORDER — LORAZEPAM 2 MG/ML IJ SOLN: 0.0000 mg | Freq: Two times a day (BID) | INTRAMUSCULAR | Status: DC

## 2022-07-15 MED ORDER — ENOXAPARIN SODIUM 40 MG/0.4ML IJ SOSY
40.0000 mg | PREFILLED_SYRINGE | INTRAMUSCULAR | Status: DC
  Administered 2022-07-15 – 2022-07-21 (×7): 40 mg via SUBCUTANEOUS
  Filled 2022-07-15 (×7): qty 0.4

## 2022-07-15 MED ORDER — FOLIC ACID 1 MG PO TABS
1.0000 mg | ORAL_TABLET | Freq: Every day | ORAL | Status: DC
  Administered 2022-07-15 – 2022-07-21 (×7): 1 mg via ORAL
  Filled 2022-07-15 (×7): qty 1

## 2022-07-15 MED ORDER — OXYCODONE-ACETAMINOPHEN 5-325 MG PO TABS
1.0000 | ORAL_TABLET | ORAL | Status: DC | PRN
  Administered 2022-07-15 – 2022-07-16 (×3): 1 via ORAL
  Filled 2022-07-15 (×3): qty 1

## 2022-07-15 MED ORDER — HYDRALAZINE HCL 20 MG/ML IJ SOLN: 5.0000 mg | INTRAMUSCULAR | Status: DC | PRN

## 2022-07-15 MED ORDER — KETOROLAC TROMETHAMINE 30 MG/ML IJ SOLN
15.0000 mg | Freq: Once | INTRAMUSCULAR | Status: AC
  Administered 2022-07-15: 15 mg via INTRAVENOUS
  Filled 2022-07-15: qty 1

## 2022-07-15 MED ORDER — THIAMINE MONONITRATE 100 MG PO TABS
100.0000 mg | ORAL_TABLET | Freq: Every day | ORAL | Status: DC
  Administered 2022-07-15 – 2022-07-21 (×7): 100 mg via ORAL
  Filled 2022-07-15 (×7): qty 1

## 2022-07-15 MED ORDER — ONDANSETRON HCL 4 MG/2ML IJ SOLN
4.0000 mg | Freq: Three times a day (TID) | INTRAMUSCULAR | Status: DC | PRN
  Administered 2022-07-20: 4 mg via INTRAVENOUS
  Filled 2022-07-15: qty 2

## 2022-07-15 MED ORDER — MORPHINE SULFATE (PF) 2 MG/ML IV SOLN
2.0000 mg | Freq: Once | INTRAVENOUS | Status: AC
  Administered 2022-07-15: 2 mg via INTRAVENOUS
  Filled 2022-07-15: qty 1

## 2022-07-15 MED ORDER — POTASSIUM CHLORIDE CRYS ER 20 MEQ PO TBCR
40.0000 meq | EXTENDED_RELEASE_TABLET | Freq: Once | ORAL | Status: AC
  Administered 2022-07-15: 40 meq via ORAL
  Filled 2022-07-15: qty 2

## 2022-07-15 MED ORDER — LORAZEPAM 1 MG PO TABS: 1.0000 mg | ORAL_TABLET | ORAL | Status: DC | PRN

## 2022-07-15 MED ORDER — MORPHINE SULFATE (PF) 4 MG/ML IV SOLN
4.0000 mg | Freq: Once | INTRAVENOUS | Status: AC
  Administered 2022-07-15: 4 mg via INTRAMUSCULAR
  Filled 2022-07-15: qty 1

## 2022-07-15 MED ORDER — LORATADINE 10 MG PO TABS
10.0000 mg | ORAL_TABLET | Freq: Every day | ORAL | Status: DC
  Administered 2022-07-15 – 2022-07-21 (×7): 10 mg via ORAL
  Filled 2022-07-15 (×7): qty 1

## 2022-07-15 MED ORDER — DILTIAZEM HCL 30 MG PO TABS
30.0000 mg | ORAL_TABLET | Freq: Two times a day (BID) | ORAL | Status: DC
  Administered 2022-07-16 – 2022-07-21 (×11): 30 mg via ORAL
  Filled 2022-07-15 (×11): qty 1

## 2022-07-15 MED ORDER — PANTOPRAZOLE SODIUM 40 MG PO TBEC
40.0000 mg | DELAYED_RELEASE_TABLET | Freq: Every day | ORAL | Status: DC
  Administered 2022-07-15 – 2022-07-21 (×7): 40 mg via ORAL
  Filled 2022-07-15 (×7): qty 1

## 2022-07-15 MED ORDER — THIAMINE HCL 100 MG/ML IJ SOLN
100.0000 mg | Freq: Every day | INTRAMUSCULAR | Status: DC
  Filled 2022-07-15 (×3): qty 2

## 2022-07-15 MED ORDER — FUROSEMIDE 10 MG/ML IJ SOLN
40.0000 mg | Freq: Two times a day (BID) | INTRAMUSCULAR | Status: DC
  Administered 2022-07-15 – 2022-07-21 (×12): 40 mg via INTRAVENOUS
  Filled 2022-07-15 (×12): qty 4

## 2022-07-15 MED ORDER — IOHEXOL 350 MG/ML SOLN
75.0000 mL | Freq: Once | INTRAVENOUS | Status: AC | PRN
  Administered 2022-07-15: 75 mL via INTRAVENOUS

## 2022-07-15 MED ORDER — LORAZEPAM 2 MG/ML IJ SOLN: 0.0000 mg | Freq: Four times a day (QID) | INTRAMUSCULAR | Status: DC

## 2022-07-15 MED ORDER — TAMSULOSIN HCL 0.4 MG PO CAPS
0.4000 mg | ORAL_CAPSULE | Freq: Every day | ORAL | Status: DC
  Administered 2022-07-15 – 2022-07-21 (×7): 0.4 mg via ORAL
  Filled 2022-07-15 (×7): qty 1

## 2022-07-15 NOTE — ED Triage Notes (Signed)
Pt presents to ER with c/o fall from a desk chair around 1.5 hours ago.  Pt states he does not remember the fall.  States he is having some lower back that is worse than his chronic lower back pain d/t his DDD.  Pt also states his neck is having some pain in his neck at this time.  Pt is not on any blood thinners.  Pt does have hx of anemia.  Pt is otherwise A&O x4 and in NAD.

## 2022-07-15 NOTE — ED Notes (Signed)
First encounter with pt. Pt was apparently desaturating on monitor. SPO2 sensor changed and oxygen turned from 2L to 3L, pt is saturating 98%. Pt did not tolerate moving HOB up to semi sitting due to intractable back pain. Pt went back to sleep after this. Respirations are unlabored.

## 2022-07-15 NOTE — ED Notes (Signed)
Pt brief was soaked with urine. Pt able to roll but has severe back pain with movement. Ice pack applied to lower back per pt request along with lidocaine patch. Peri care performed and promofit placed (pt cannot use urinal at this time due to pain with movement). Bedsheets under new chux have urine but was unable to get them out from under pt. Pt has dry chux under him.

## 2022-07-15 NOTE — ED Provider Notes (Signed)
[facility] Provider Note    Event Date/Time   First MD Initiated Contact with Patient 07/15/22 [phone]     (approximate)   History   Fall   HPI  Marcus Coleman is a 82 y.o. male brought to the ED via EMS from home status post fall with back and neck pain.  Patient thinks he may have fallen asleep in his office chair and struck his back.  History of chronic back pain due to degenerative disc disease but is having acute on chronic pain.  Denies leg weakness/numbness/tingling.  Denies bowel or bladder incontinence.  Also endorses neck pain.  Denies LOC.  Denies anticoagulant use.  History of EMEA status post recent blood transfusion.  Denies headache, vision changes, chest pain, shortness of breath, abdominal pain, nausea, vomiting or dizziness.     Past Medical History   Past Medical History:  Diagnosis Date   Aortic atherosclerosis (HCC)    Atrial fibrillation (HCC)    a.) CHA2DS2VASc = 6 (age x2, HTN, DVT x2, vascular disease history);  b.) rate/rhythm maintained on oral diltiazem + metoprolol succinate; chronic anticoagulation (apixaban) discontinued due to GI bleeding   BPH with obstruction/lower urinary tract symptoms    Bronchitis 01/27/2022   CAD (coronary artery disease)    Cardiomegaly    Chronic diarrhea    Compression fracture of thoracic vertebra (HCC)    a.) CT renal 12/30/2021: chronic appearing T11-T12 (70% height loss)   DDD (degenerative disc disease), cervical    Diverticulosis    DVT (deep venous thrombosis) (HCC) 03/31/2019   a.) anticoagulation intiated (apixaban), however subsequently discontinued due to GI bleeding   ETOH abuse    GI bleed    Heart murmur    Hiatal hernia    Hyperlipidemia    Hypertension    IDA (iron deficiency anemia)    Low testosterone    Nephrolithiasis    Pneumonia    Rotator cuff arthropathy, left    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    Vasovagal syncope      Active Problem List   Patient  Active Problem List   Diagnosis Date Noted   Male erectile disorder 02/27/2022   Exposure to Agent [facility] 02/27/2022   Other persistent atrial fibrillation (HCC) 02/27/2022   Decreased responsiveness 02/11/2022   Elevated lactic acid level 02/11/2022   Severe sepsis (HCC) 02/10/2022   Status post reverse total shoulder replacement, 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   Nontraumatic complete tear of left rotator cuff 12/26/2021   Rotator cuff arthropathy, left 12/26/2021   Rotator cuff tendinitis, left 12/26/2021   Persistent atrial fibrillation (HCC) 06/01/2021   Cataract 11/23/2020   Low back pain 11/23/2020   Other personal history presenting hazards to health 11/23/2020   Psychosexual dysfunction with inhibited sexual excitement 11/23/2020   History of GI bleed 06/09/2019   Acute deep vein thrombosis (DVT) of popliteal vein (HCC) 06/09/2019   History of DVT (deep vein thrombosis) 06/09/2019   B12 deficiency anemia 04/21/2019   Pain in limb 03/28/2019   Nonrheumatic mitral valve regurgitation 12/10/2018   Benign prostatic hyperplasia with lower urinary tract symptoms 11/06/2018   Hiatal hernia 09/28/2017   Hyperlipidemia 09/28/2017   Vasovagal syncope 09/28/2017   DDD (degenerative disc disease), thoracolumbar 11/13/2014   Lumbar radiculitis 11/13/2014   Iron deficiency anemia 10/06/2013   Anemia 10/06/2013   S/P ERCP 01/23/2013     Past Surgical History   Past Surgical History:  Procedure Laterality Date  boils removed from right forearm  1968   CARDIAC CATHETERIZATION     CATARACT EXTRACTION Left 2008   catracts Bilateral    left 2008, right 2013   CHOLECYSTECTOMY     COLONOSCOPY WITH PROPOFOL N/A 06/26/2016   Procedure: COLONOSCOPY WITH PROPOFOL;  Surgeon: Scot Jun, MD;  Location: [facility] ENDOSCOPY;  Service: Endoscopy;  Laterality: N/A;   COLONOSCOPY WITH PROPOFOL N/A 07/03/2019   Procedure: COLONOSCOPY WITH PROPOFOL;  Surgeon: Wyline Mood, MD;  Location: [facility] ENDOSCOPY;   Service: Gastroenterology;  Laterality: N/A;   ENDOSCOPIC RETROGRADE CHOLANGIOPANCREATOGRAPHY (ERCP) WITH PROPOFOL     ENDOSCOPIC VEIN LASER TREATMENT     right leg 05/2015, left leg 09/2015   ESOPHAGOGASTRODUODENOSCOPY N/A 04/19/2019   Procedure: ESOPHAGOGASTRODUODENOSCOPY (EGD);  Surgeon: Wyline Mood, MD;  Location: [facility] ENDOSCOPY;  Service: Gastroenterology;  Laterality: N/A;   ESOPHAGOGASTRODUODENOSCOPY (EGD) WITH PROPOFOL N/A 07/03/2019   Procedure: ESOPHAGOGASTRODUODENOSCOPY (EGD) WITH PROPOFOL;  Surgeon: Wyline Mood, MD;  Location: [facility] ENDOSCOPY;  Service: Gastroenterology;  Laterality: N/A;   ESOPHAGOGASTRODUODENOSCOPY (EGD) WITH PROPOFOL N/A 08/07/2019   Procedure: ESOPHAGOGASTRODUODENOSCOPY (EGD) WITH PROPOFOL;  Surgeon: Rachael Fee, MD;  Location: [facility] ENDOSCOPY;  Service: Endoscopy;  Laterality: N/A;   EUS N/A 08/07/2019   Procedure: UPPER ENDOSCOPIC ULTRASOUND (EUS) RADIAL;  Surgeon: Rachael Fee, MD;  Location: [facility] ENDOSCOPY;  Service: Endoscopy;  Laterality: N/A;   eyelid surgery     FOOT SURGERY Right 2009   HEMORROIDECTOMY     HERNIA REPAIR     mrercp  2014   NOSE SURGERY  2011   REVERSE SHOULDER ARTHROPLASTY Left 02/09/2022   Procedure: REVERSE SHOULDER ARTHROPLASTY AND BICEPS TENODESIS.;  Surgeon: Christena Flake, MD;  Location: [facility] ORS;  Service: Orthopedics;  Laterality: Left;   RHINOPLASTY     squamous cell carcinoma removed from right hand  2013   TONSILLECTOMY     WISDOM TOOTH EXTRACTION       Home Medications   Prior to Admission medications   Medication Sig Start Date End Date Taking? Authorizing Provider  acetaminophen (TYLENOL) 650 MG CR tablet Take 650 mg by mouth every 8 (eight) hours as needed for pain.    [provider]  cetirizine (ZYRTEC) 10 MG tablet Take 10 mg by mouth every morning.    [provider]  clindamycin (CLEOCIN T) 1 % external solution Apply 1 % topically as needed. To head 01/27/20   [provider]   diltiazem (CARDIZEM) 30 MG tablet Take 1 tablet (30 mg total) by mouth every 12 (twelve) hours. 02/14/22   Sunnie Nielsen, DO  fluticasone (FLONASE) 50 MCG/ACT nasal spray Place 1 spray into both nostrils daily. 02/15/22   Sunnie Nielsen, DO  furosemide (LASIX) 20 MG tablet Take 40 mg by mouth every morning. 04/18/21   [provider]  HYDROcodone-acetaminophen (NORCO/VICODIN) 5-325 MG tablet Take 1-2 tablets by mouth every 6 (six) hours as needed for moderate pain or severe pain. Patient not taking: Reported on 02/27/2022 02/09/22 02/09/23  Poggi, Excell Seltzer, MD  pantoprazole (PROTONIX) 40 MG tablet Take 1 tablet (40 mg total) by mouth daily. 02/15/22   Sunnie Nielsen, DO  sodium chloride (OCEAN) 0.65 % SOLN nasal spray Place 1 spray into both nostrils as needed for congestion. 02/14/22   Sunnie Nielsen, DO  tamsulosin (FLOMAX) 0.4 MG CAPS capsule Take 1 capsule (0.4 mg total) by mouth daily. 02/15/22   Sunnie Nielsen, DO  timolol (TIMOPTIC) 0.5 % ophthalmic solution Place 1 drop into both eyes daily.  11/21/18   [provider]     Allergies  Nsaids   Family History   Family History  Problem Relation Age of Onset   Hypothyroidism Mother    Diabetes Mother    Lung cancer Mother      Physical Exam  Triage Vital Signs: ED Triage Vitals  Enc Vitals Group     BP 07/15/22 0012 103/71     Pulse Rate 07/15/22 0012 93     Resp 07/15/22 0012 15     Temp 07/15/22 0012 97.6 F (36.4 C)     Temp Source 07/15/22 0012 Oral     SpO2 07/15/22 0012 95 %     Weight --      Height --      Head Circumference --      Peak Flow --      Pain Score 07/15/22 0020 7     Pain Loc --      Pain Edu? --      Excl. in GC? --     Updated Vital Signs: BP 133/84   Pulse 91   Temp 97.6 F (36.4 C) (Oral)   Resp 15   SpO2 96%    General: Awake, mild distress.  CV:  Irregular rhythm, regular rate.  Good peripheral perfusion.  Resp:  Normal effort.   CTAB. Abd:  Nontender.  No distention.  Other:  Head is atraumatic.  PERRL.  EOMI.  Nose is atraumatic.  No dental malocclusion.  No midline cervical spine tenderness to palpation, step-offs or deformities noted.  Lumbar spine tenderness to palpation without step-offs or deformities noted.  Pelvis is stable.  Limited range of motion legs secondary to back pain.  BLE 1+ pitting edema.   ED Results / Procedures / Treatments  Labs (all labs ordered are listed, but only abnormal results are displayed) Labs Reviewed  CBC - Abnormal; Notable for the following components:      Result Value   RBC 3.83 (*)    Hemoglobin 11.2 (*)    HCT 36.7 (*)    RDW 18.0 (*)    All other components within normal limits  COMPREHENSIVE METABOLIC PANEL  TROPONIN I (HIGH SENSITIVITY)     EKG  ED ECG REPORT I, Zitlaly Malson J, the attending physician, personally viewed and interpreted this ECG.   Date: 07/15/2022  EKG Time: 0020  Rate: 81  Rhythm: atrial fibrillation, rate 81  Axis: Normal  Intervals:none  ST&T Change: Nonspecific    RADIOLOGY Independently visualized and interpreted patient's CT and x-rays as well as noted the radiology interpretation:  CT head: No ICH  CT cervical spine: No acute osseous injury  Lumbar spine x-ray: Chronic DDD changes, chronic T12 compression fracture.  CT lumbar spine: No acute fracture  Official radiology report(s): CT Lumbar Spine Wo Contrast  Result Date: 07/15/2022 CLINICAL DATA:  82 year old male with low back pain.  Fall. EXAM: CT LUMBAR SPINE WITHOUT CONTRAST TECHNIQUE: Multidetector CT imaging of the lumbar spine was performed without intravenous contrast administration. Multiplanar CT image reconstructions were also generated. RADIATION DOSE REDUCTION: This exam was performed according to the departmental dose-optimization program which includes automated exposure control, adjustment of the mA and/or kV according to patient size and/or use of iterative  reconstruction technique. COMPARISON:  Lumbar radiographs 0302 hours today. CT Abdomen and Pelvis 12/30/2021. lumbar MRI 12/07/2017. FINDINGS: Segmentation: Normal, same numbering system used on 2019 lumbar MRI. Alignment: Chronic S-shaped thoracolumbar scoliosis is stable from last year. Levoconvex apex at  L3. Relatively maintained lumbar lordosis. Mild chronic retrolisthesis at L2-L3 and similar anterolisthesis of L5 on S1. Vertebrae: Chronic T12 compression fracture. Chronic L1-L2 interbody ankylosis. Visible lower ribs appear intact. Lumbar levels appear stable and intact. Visible sacrum and SI joints, pelvis appear grossly stable and intact. No acute osseous abnormality identified. Paraspinal and other soft tissues: Visible noncontrast abdominal viscera are stable from last year, including left nephrolithiasis, Aortoiliac calcified atherosclerosis. Lumbar paraspinal soft tissues are stable, remarkable for paraspinal atrophy. Disc levels: Chronic lower thoracic and lumbar spine degeneration superimposed on chronic L1-L2 ankylosis, and fairly capacious underlying spinal canal. Mild multifactorial lower thoracic spinal stenosis at T10-T11. Borderline to mild multifactorial lumbar spinal stenosis at L2-L3, L4-L5. IMPRESSION: 1. No acute traumatic injury identified in the lumbar spine. Chronic T12 compression fracture, L1-L2 ankylosis. 2. Chronic lower thoracic and lumbar spine degeneration superimposed on S-shaped scoliosis. Mild multifactorial spinal stenosis at T10-T11, L2-L3, L4-L5. 3. Visible abdomen stable from CT Abdomen and Pelvis last year, Aortic Atherosclerosis (ICD10-I70.0). Electronically Signed   By: Odessa Fleming M.D.   On: 07/15/2022 06:04   DG Lumbar Spine Complete  Result Date: 07/15/2022 CLINICAL DATA:  Fall, low back pain EXAM: LUMBAR SPINE - COMPLETE 4+ VIEW COMPARISON:  CT abdomen and pelvis 12/30/2021 FINDINGS: Diffuse osteopenia. There is convex leftward scoliosis centered in the mid lumbar  spine and convex rightward scoliosis centered in the lower thoracic spine. Advanced degenerative disc and facet disease. Partial fusion across the L1-2 disc space. Moderate compression fracture at T12 is stable since prior CT. No acute fracture. IMPRESSION: Stable moderate chronic T12 compression fracture. Thoracolumbar scoliosis. Associated advanced degenerative disc and facet disease. Diffuse osteopenia. No acute bony abnormality. Electronically Signed   By: Charlett Nose M.D.   On: 07/15/2022 03:30   CT HEAD WO CONTRAST ( )  Result Date: 07/15/2022 CLINICAL DATA:  Fall EXAM: CT HEAD WITHOUT CONTRAST CT CERVICAL SPINE WITHOUT CONTRAST TECHNIQUE: Multidetector CT imaging of the head and cervical spine was performed following the standard protocol without intravenous contrast. Multiplanar CT image reconstructions of the cervical spine were also generated. RADIATION DOSE REDUCTION: This exam was performed according to the departmental dose-optimization program which includes automated exposure control, adjustment of the mA and/or kV according to patient size and/or use of iterative reconstruction technique. COMPARISON:  08/02/2020 CT head, no prior CT cervical spine available FINDINGS: CT HEAD FINDINGS Brain: No evidence of acute infarct, hemorrhage, mass, mass effect, or midline shift. No hydrocephalus or extra-axial fluid collection. Vascular: No hyperdense vessel. Skull: Negative for fracture or focal lesion. Sinuses/Orbits: Mucosal thickening in the maxillary sinuses and ethmoid air cells. Osseous thickening in the left sphenoid sinus, likely sequela of chronic sinusitis. Status post bilateral lens replacements. Other: The mastoid air cells are well aerated. CT CERVICAL SPINE FINDINGS Alignment: No traumatic listhesis. Levocurvature of the cervical spine. Reversal of the normal cervical lordosis. Skull base and vertebrae: No acute fracture or suspicious osseous lesion.Osseous fusion across the disc spaces at  C4-C7, as well as at T2-T3. Soft tissues and spinal canal: No prevertebral fluid or swelling. No visible canal hematoma. Disc levels: Degenerative changes in the cervical spine. At least mild spinal canal stenosis at C4-C5. Multilevel facet and uncovertebral hypertrophy. Upper chest: No focal pulmonary opacity or pleural effusion. Aortic atherosclerosis. IMPRESSION: 1. No acute intracranial process. 2. No acute fracture or traumatic listhesis in the cervical spine. 3. Aortic atherosclerosis. Aortic Atherosclerosis (ICD10-I70.0). Electronically Signed   By: Wiliam Ke M.D.   On: 07/15/2022 01:19  CT Cervical Spine Wo Contrast  Result Date: 07/15/2022 CLINICAL DATA:  Fall EXAM: CT HEAD WITHOUT CONTRAST CT CERVICAL SPINE WITHOUT CONTRAST TECHNIQUE: Multidetector CT imaging of the head and cervical spine was performed following the standard protocol without intravenous contrast. Multiplanar CT image reconstructions of the cervical spine were also generated. RADIATION DOSE REDUCTION: This exam was performed according to the departmental dose-optimization program which includes automated exposure control, adjustment of the mA and/or kV according to patient size and/or use of iterative reconstruction technique. COMPARISON:  08/02/2020 CT head, no prior CT cervical spine available FINDINGS: CT HEAD FINDINGS Brain: No evidence of acute infarct, hemorrhage, mass, mass effect, or midline shift. No hydrocephalus or extra-axial fluid collection. Vascular: No hyperdense vessel. Skull: Negative for fracture or focal lesion. Sinuses/Orbits: Mucosal thickening in the maxillary sinuses and ethmoid air cells. Osseous thickening in the left sphenoid sinus, likely sequela of chronic sinusitis. Status post bilateral lens replacements. Other: The mastoid air cells are well aerated. CT CERVICAL SPINE FINDINGS Alignment: No traumatic listhesis. Levocurvature of the cervical spine. Reversal of the normal cervical lordosis. Skull base  and vertebrae: No acute fracture or suspicious osseous lesion.Osseous fusion across the disc spaces at C4-C7, as well as at T2-T3. Soft tissues and spinal canal: No prevertebral fluid or swelling. No visible canal hematoma. Disc levels: Degenerative changes in the cervical spine. At least mild spinal canal stenosis at C4-C5. Multilevel facet and uncovertebral hypertrophy. Upper chest: No focal pulmonary opacity or pleural effusion. Aortic atherosclerosis. IMPRESSION: 1. No acute intracranial process. 2. No acute fracture or traumatic listhesis in the cervical spine. 3. Aortic atherosclerosis. Aortic Atherosclerosis (ICD10-I70.0). Electronically Signed   By: Wiliam Ke M.D.   On: 07/15/2022 01:19     PROCEDURES:  Critical Care performed: Yes, see critical care procedure note(s)  CRITICAL CARE Performed by: Irean Hong   Total critical care time: 30 minutes  Critical care time was exclusive of separately billable procedures and treating other patients.  Critical care was necessary to treat or prevent imminent or life-threatening deterioration.  Critical care was time spent personally by me on the following activities: development of treatment plan with patient and/or surrogate as well as nursing, discussions with consultants, evaluation of patient's response to treatment, examination of patient, obtaining history from patient or surrogate, ordering and performing treatments and interventions, ordering and review of laboratory studies, ordering and review of radiographic studies, pulse oximetry and re-evaluation of patient's condition.   Marland Kitchen1-3 Lead EKG Interpretation  Performed by: Irean Hong, MD Authorized by: Irean Hong, MD     Interpretation: normal     ECG rate:  85   ECG rate assessment: normal     Rhythm: sinus rhythm     Ectopy: none     Conduction: normal   Comments:     Placed on cardiac monitor to evaluate for arrhythmias    MEDICATIONS ORDERED IN ED: Medications   morphine (PF) 4 MG/ML injection 4 mg (4 mg Intramuscular Given 07/15/22 0258)  ondansetron (ZOFRAN-ODT) disintegrating tablet 4 mg (4 mg Oral Given 07/15/22 0255)  morphine (PF) 2 MG/ML injection 2 mg (2 mg Intravenous Given 07/15/22 0554)  ketorolac (TORADOL) 30 MG/ML injection 15 mg (15 mg Intravenous Given 07/15/22 0557)     IMPRESSION / MDM / ASSESSMENT AND PLAN / ED COURSE  I reviewed the triage vital signs and the nursing notes.  82 year old male who presents with neck and low back pain status post mechanical fall.  Differential diagnosis includes but is not limited to cervical spine fracture/dislocation, lumbar spine fracture/dislocation, contusion, etc.  Personally reviewed patient's records and note an iron infusion on 07/13/2022 at the oncology clinic.  Patient's presentation is most consistent with acute presentation with potential threat to life or bodily function.  Laboratory results demonstrate improved hemoglobin from prior, unremarkable electrolytes including normal troponin.  CT head and cervical spine negative for acute traumatic injury.  Will obtain lumbar spine x-rays.  Administer IM Morphine.  With ODT Zofran.  Will reassess.  Clinical Course as of 07/15/22 4098  Sat Jul 15, 2022  0402 Updated patient and spouse negative x-ray, degenerative changes, chronic compression fracture.  Patient states he is feeling better and able to move about.  Morphine was fairly strong and he desaturated; currently on 2 L nasal cannula oxygen.  Will continue to monitor and care for patient; will slowly get him up and perform ambulation trial. [JS]  0515 Patient unable to get up from the stretcher due to back pain.  Will place IV, administer IV medications.  Will consult hospitalist services for evaluation and admission. [JS]    Clinical Course User Index [JS] Irean Hong, MD     FINAL CLINICAL IMPRESSION(S) / ED DIAGNOSES   Final diagnoses:  Fall, initial encounter   Acute midline low back pain without sciatica  Hypoxia  Intractable pain     Rx / DC Orders   ED Discharge Orders     None        Note:  This document was prepared using Dragon voice recognition software and may include unintentional dictation errors.   Irean Hong, MD 07/15/22 [phone]

## 2022-07-15 NOTE — ED Notes (Signed)
Attempted to ambulate pt with walker, but pt yelled out in pain and stated that he "couldn't do it". Pt refused to get up and ambulate. Wife remains at bedside. Dr. Dolores Frame made aware

## 2022-07-15 NOTE — Plan of Care (Signed)

## 2022-07-15 NOTE — ED Notes (Signed)
Pts wife reports that pt had appx one pint of wine with dinner tonight that he finished around 1900 yesterday.

## 2022-07-15 NOTE — H&P (Addendum)
History and Physical    Marcus Coleman WGN:[id number] DOB: Aug 05, 1940 DOA: 07/15/2022  Referring MD/NP/PA:   PCP: [name], MD   Patient coming from:  The patient is coming from home.     Chief Complaint: Fall, back pain   HPI: Marcus Coleman is a 82 y.o. male with medical history significant of hypertension, hyperlipidemia, CAD, GERD, GI bleeding, BPH, anemia, atrial fibrillation, DVT not on anticoagulants, chronic back pain, who presents with fall, back pain.  Pt states that he had appx one pint of wine with dinner around 1900 yesterday. Patient thinks he may have fallen asleep in his computer chair and struck his back after fall.  He has chronic back pain, which has worsened significantly.  The pain is located in middle and lower back, constant, sharp, severe, nonradiating.  It is aggravated by movement. No leg numbness or weakness.  No incontinence.  Denies head or neck injury.  Patient denies shortness of breath, but was found to have oxygen desaturation to 82% on room air, which increased to 99% on 2 to 3 L oxygen.  Patient initially had 95% of oxygen saturation in room air. His oxygen desaturation happened after receiving morphine in ED.  No chest pain, cough, fever or chills.  No nausea, vomiting, diarrhea or abdominal pain.  No symptoms of UTI.  Patient has bilateral lower leg edema.    Data reviewed independently and ED Course: pt was found to have WBC 5.7, GFR> 60, negative covid19, temperature normal, blood pressure 126/70, heart rate 73, 93, RR 20.  Chest x-ray showed mild cardiomegaly without infiltration.  Negative CT head and C-spine.  CTA negative for PE. LE doppler negative for DVT. Patient is placed on TeleMed follow-up patient.  CT of L-spine: 1. No acute traumatic injury identified in the lumbar spine. Chronic T12 compression fracture, L1-L2 ankylosis. 2. Chronic lower thoracic and lumbar spine degeneration superimposed on S-shaped scoliosis. Mild multifactorial  spinal stenosis at T10-T11, L2-L3, L4-L5. 3. Visible abdomen stable from CT Abdomen and Pelvis last year, Aortic Atherosclerosis (ICD10-I70.0).  CTA: 1. No evidence of a pulmonary embolism. 2. No acute findings. 3. Coronary artery calcifications and aortic atherosclerosis. 4. Moderate to large hiatal hernia is stable from the prior CT.   Aortic Atherosclerosis (ICD10-I70.0).   EKG: I have personally reviewed.  A-fib, QTc 462, low voltage, poor R wave progression, mild T wave inversion in V3-V5   Review of Systems:   General: no fevers, chills, no body weight gain, has fatigue HEENT: no blurry vision, hearing changes or sore throat Respiratory: no dyspnea, coughing, wheezing CV: no chest pain, no palpitations GI: no nausea, vomiting, abdominal pain, diarrhea, constipation GU: no dysuria, burning on urination, increased urinary frequency, hematuria  Ext: has leg edema Neuro: no unilateral weakness, numbness, or tingling, no vision change or hearing loss. Has fall Skin: has some excoriation his groin area  MSK: No muscle spasm, no deformity, no limitation of range of movement in spin. Has back pain Heme: No easy bruising.  Travel history: No recent long distant travel.   Allergy:  Allergies  Allergen Reactions   Nsaids     Can take Ibuprofen in moderation/high doses or prolonged use causes GI bleed    Past Medical History:  Diagnosis Date   Aortic atherosclerosis (HCC)    Atrial fibrillation (HCC)    a.) CHA2DS2VASc = 6 (age x2, HTN, DVT x2, vascular disease history);  b.) rate/rhythm maintained on oral diltiazem + metoprolol succinate; chronic anticoagulation (  apixaban) discontinued due to GI bleeding   BPH with obstruction/lower urinary tract symptoms    Bronchitis 01/27/2022   CAD (coronary artery disease)    Cardiomegaly    Chronic diarrhea    Compression fracture of thoracic vertebra (HCC)    a.) CT renal 12/30/2021: chronic appearing T11-T12 (70% height loss)    DDD (degenerative disc disease), cervical    Diverticulosis    DVT (deep venous thrombosis) (HCC) 03/31/2019   a.) anticoagulation intiated (apixaban), however subsequently discontinued due to GI bleeding   ETOH abuse    GI bleed    Heart murmur    Hiatal hernia    Hyperlipidemia    Hypertension    IDA (iron deficiency anemia)    Low testosterone    Nephrolithiasis    Pneumonia    Rotator cuff arthropathy, left    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    Vasovagal syncope     Past Surgical History:  Procedure Laterality Date   boils removed from right forearm  1968   CARDIAC CATHETERIZATION     CATARACT EXTRACTION Left 2008   catracts Bilateral    left 2008, right 2013   CHOLECYSTECTOMY     COLONOSCOPY WITH PROPOFOL N/A 06/26/2016   Procedure: COLONOSCOPY WITH PROPOFOL;  Surgeon: Scot Jun, MD;  Location: [facility] ENDOSCOPY;  Service: Endoscopy;  Laterality: N/A;   COLONOSCOPY WITH PROPOFOL N/A 07/03/2019   Procedure: COLONOSCOPY WITH PROPOFOL;  Surgeon: Wyline Mood, MD;  Location: [facility] ENDOSCOPY;  Service: Gastroenterology;  Laterality: N/A;   ENDOSCOPIC RETROGRADE CHOLANGIOPANCREATOGRAPHY (ERCP) WITH PROPOFOL     ENDOSCOPIC VEIN LASER TREATMENT     right leg 05/2015, left leg 09/2015   ESOPHAGOGASTRODUODENOSCOPY N/A 04/19/2019   Procedure: ESOPHAGOGASTRODUODENOSCOPY (EGD);  Surgeon: Wyline Mood, MD;  Location: [facility] ENDOSCOPY;  Service: Gastroenterology;  Laterality: N/A;   ESOPHAGOGASTRODUODENOSCOPY (EGD) WITH PROPOFOL N/A 07/03/2019   Procedure: ESOPHAGOGASTRODUODENOSCOPY (EGD) WITH PROPOFOL;  Surgeon: Wyline Mood, MD;  Location: [facility] ENDOSCOPY;  Service: Gastroenterology;  Laterality: N/A;   ESOPHAGOGASTRODUODENOSCOPY (EGD) WITH PROPOFOL N/A 08/07/2019   Procedure: ESOPHAGOGASTRODUODENOSCOPY (EGD) WITH PROPOFOL;  Surgeon: Rachael Fee, MD;  Location: [facility] ENDOSCOPY;  Service: Endoscopy;  Laterality: N/A;   EUS N/A 08/07/2019   Procedure: UPPER ENDOSCOPIC ULTRASOUND  (EUS) RADIAL;  Surgeon: Rachael Fee, MD;  Location: [facility] ENDOSCOPY;  Service: Endoscopy;  Laterality: N/A;   eyelid surgery     FOOT SURGERY Right 2009   HEMORROIDECTOMY     HERNIA REPAIR     mrercp  2014   NOSE SURGERY  2011   REVERSE SHOULDER ARTHROPLASTY Left 02/09/2022   Procedure: REVERSE SHOULDER ARTHROPLASTY AND BICEPS TENODESIS.;  Surgeon: Christena Flake, MD;  Location: [facility] ORS;  Service: Orthopedics;  Laterality: Left;   RHINOPLASTY     squamous cell carcinoma removed from right hand  2013   TONSILLECTOMY     WISDOM TOOTH EXTRACTION      Social History:  reports that he quit smoking about 35 years ago. His smoking use included cigarettes. He has a 25.00 pack-year smoking history. He has never used smokeless tobacco. He reports current alcohol use of about 14.0 standard drinks of alcohol per week. He reports that he does not use drugs.  Family History:  Family History  Problem Relation Age of Onset   Hypothyroidism Mother    Diabetes Mother    Lung cancer Mother      Prior to Admission medications   Medication Sig Start Date End Date Taking?  Authorizing Provider  acetaminophen (TYLENOL) 650 MG CR tablet Take 650 mg by mouth every 8 (eight) hours as needed for pain.    [provider]  cetirizine (ZYRTEC) 10 MG tablet Take 10 mg by mouth every morning.    [provider]  clindamycin (CLEOCIN T) 1 % external solution Apply 1 % topically as needed. To head 01/27/20   [provider]  diltiazem (CARDIZEM) 30 MG tablet Take 1 tablet (30 mg total) by mouth every 12 (twelve) hours. 02/14/22   Sunnie Nielsen, DO  fluticasone (FLONASE) 50 MCG/ACT nasal spray Place 1 spray into both nostrils daily. 02/15/22   Sunnie Nielsen, DO  furosemide (LASIX) 20 MG tablet Take 40 mg by mouth every morning. 04/18/21   [provider]  HYDROcodone-acetaminophen (NORCO/VICODIN) 5-325 MG tablet Take 1-2 tablets by mouth every 6 (six) hours as needed for  moderate pain or severe pain. Patient not taking: Reported on 02/27/2022 02/09/22 02/09/23  Poggi, Excell Seltzer, MD  pantoprazole (PROTONIX) 40 MG tablet Take 1 tablet (40 mg total) by mouth daily. 02/15/22   Sunnie Nielsen, DO  sodium chloride (OCEAN) 0.65 % SOLN nasal spray Place 1 spray into both nostrils as needed for congestion. 02/14/22   Sunnie Nielsen, DO  tamsulosin (FLOMAX) 0.4 MG CAPS capsule Take 1 capsule (0.4 mg total) by mouth daily. 02/15/22   Sunnie Nielsen, DO  timolol (TIMOPTIC) 0.5 % ophthalmic solution Place 1 drop into both eyes daily.  11/21/18   [provider]    Physical Exam: Vitals:   07/15/22 1044 07/15/22 1130 07/15/22 1132 07/15/22 1222  BP:  127/74  127/74  Pulse:  88  88  Resp:  18    Temp:      TempSrc:      SpO2: 97% (!) 88% 96%    General: Not in acute distress HEENT:       Eyes: PERRL, EOMI, no scleral icterus.       ENT: No discharge from the ears and nose, no pharynx injection, no tonsillar enlargement.        Neck: No JVD, no bruit, no mass felt. Heme: No neck lymph node enlargement. Cardiac: S1/S2, RRR, No murmurs, No gallops or rubs. Respiratory: No rales, wheezing, rhonchi or rubs.  GI: Soft, nondistended, nontender, no rebound pain, no organomegaly, BS present. GU: No hematuria Ext: has 2+ pitting leg edema bilaterally. 1+DP/PT pulse bilaterally. Musculoskeletal: has tenderness at midline of mid and lower back  Skin: has some excoriation his groin area, likely yeast infection Neuro: Alert, oriented X3, cranial nerves II-XII grossly intact, moves all extremities  Psych: Patient is not psychotic, no suicidal or hemocidal ideation.  Labs on Admission: I have personally reviewed following labs and imaging studies  CBC: Recent Labs  Lab 07/15/22 0024  WBC 5.7  HGB 11.2*  HCT 36.7*  MCV 95.8  PLT 200   Basic Metabolic Panel: Recent Labs  Lab 07/15/22 0024  NA 139  K 3.7  CL 102  CO2 26  GLUCOSE 85  BUN 17   CREATININE 0.63  CALCIUM 8.9   GFR: Estimated Creatinine Clearance: 78.8 mL/min (by C-G formula based on SCr of 0.63 mg/dL). Liver Function Tests: Recent Labs  Lab 07/15/22 0024  AST 19  ALT 11  ALKPHOS 69  BILITOT 0.7  PROT 6.5  ALBUMIN 3.5   No results for input(s): "LIPASE", "AMYLASE" in the last 168 hours. No results for input(s): "AMMONIA" in the last 168 hours. Coagulation Profile: No results  for input(s): "INR", "PROTIME" in the last 168 hours. Cardiac Enzymes: No results for input(s): "CKTOTAL", "CKMB", "CKMBINDEX", "TROPONINI" in the last 168 hours. BNP (last 3 results) No results for input(s): "PROBNP" in the last 8760 hours. HbA1C: No results for input(s): "HGBA1C" in the last 72 hours. CBG: No results for input(s): "GLUCAP" in the last 168 hours. Lipid Profile: No results for input(s): "CHOL", "HDL", "LDLCALC", "TRIG", "CHOLHDL", "LDLDIRECT" in the last 72 hours. Thyroid Function Tests: No results for input(s): "TSH", "T4TOTAL", "FREET4", "T3FREE", "THYROIDAB" in the last 72 hours. Anemia Panel: No results for input(s): "VITAMINB12", "FOLATE", "FERRITIN", "TIBC", "IRON", "RETICCTPCT" in the last 72 hours. Urine analysis:    Component Value Date/Time   COLORURINE AMBER (A) 02/10/2022 1637   APPEARANCEUR CLOUDY (A) 02/10/2022 1637   APPEARANCEUR Clear 01/13/2022 0905   LABSPEC 1.023 02/10/2022 1637   PHURINE 5.0 02/10/2022 1637   GLUCOSEU NEGATIVE 02/10/2022 1637   HGBUR SMALL (A) 02/10/2022 1637   BILIRUBINUR NEGATIVE 02/10/2022 1637   BILIRUBINUR Negative 01/13/2022 0905   KETONESUR 5 (A) 02/10/2022 1637   PROTEINUR >=300 (A) 02/10/2022 1637   NITRITE NEGATIVE 02/10/2022 1637   LEUKOCYTESUR NEGATIVE 02/10/2022 1637   Sepsis Labs: @LABRCNTIP (procalcitonin:4,lacticidven:4) ) Recent Results (from the past 240 hour(s))  SARS Coronavirus 2 by RT PCR (hospital order, performed in [facility] hospital lab) *cepheid single result test* Anterior Nasal Swab      Status: None   Collection Time: 07/15/22  8:40 AM   Specimen: Anterior Nasal Swab  Result Value Ref Range Status   SARS Coronavirus 2 by RT PCR NEGATIVE NEGATIVE Final    Comment: (NOTE) SARS-CoV-2 target nucleic acids are NOT DETECTED.  The SARS-CoV-2 RNA is generally detectable in upper and lower respiratory specimens during the acute phase of infection. The lowest concentration of SARS-CoV-2 viral copies this assay can detect is 250 copies / mL. A negative result does not preclude SARS-CoV-2 infection and should not be used as the sole basis for treatment or other patient management decisions.  A negative result may occur with improper specimen collection / handling, submission of specimen other than nasopharyngeal swab, presence of viral mutation(s) within the areas targeted by this assay, and inadequate number of viral copies (<250 copies / mL). A negative result must be combined with clinical observations, patient history, and epidemiological information.  Fact Sheet for Patients:   RoadLapTop.co.za  Fact Sheet for Healthcare Providers: http://kim-miller.com/  This test is not yet approved or  cleared by the [place] FDA and has been authorized for detection and/or diagnosis of SARS-CoV-2 by FDA under an Emergency Use Authorization (EUA).  This EUA will remain in effect (meaning this test can be used) for the duration of the COVID-19 declaration under Section 564(b)(1) of the Act, 21 U.S.C. section 360bbb-3(b)(1), unless the authorization is terminated or revoked sooner.  Performed at [facility], [address] [place], [place] [address]      Radiological Exams on Admission: CT Angio Chest Pulmonary Embolism (PE) W or WO Contrast  Result Date: 07/15/2022 CLINICAL DATA:  Clinical suspicion for pulmonary embolism. Shortness of breath. EXAM: CT ANGIOGRAPHY CHEST WITH CONTRAST TECHNIQUE: Multidetector CT  imaging of the chest was performed using the standard protocol during bolus administration of intravenous contrast. Multiplanar CT image reconstructions and MIPs were obtained to evaluate the vascular anatomy. RADIATION DOSE REDUCTION: This exam was performed according to the departmental dose-optimization program which includes automated exposure control, adjustment of the mA and/or kV according to patient size  and/or use of iterative reconstruction technique. CONTRAST:  75mL OMNIPAQUE IOHEXOL 350 MG/ML SOLN COMPARISON:  Current and prior chest radiographs. Prior chest CTA, 02/10/2022. FINDINGS: Cardiovascular: Pulmonary arteries are well opacified. There is no evidence of a pulmonary embolism. Heart normal in size. Three-vessel coronary artery calcifications. No pericardial effusion. Great vessels are normal caliber. Aorta not opacified. Mild aortic atherosclerotic calcifications. Mediastinum/Nodes: Moderate to large hiatal hernia. No neck base, mediastinal or hilar masses or enlarged lymph nodes. Trachea and esophagus are unremarkable. Lungs/Pleura: Mild interstitial thickening bilaterally, most evident in the lower lungs. Mild subsegmental atelectasis dependently and adjacent to the hiatal hernia. No evidence of pneumonia or pulmonary edema. No lung mass or nodule. No pleural effusion or pneumothorax. Upper Abdomen: No acute findings. Musculoskeletal: Stable wedge-shaped confer a shin deformity of T12. No acute fractures. No bone lesions. Partly imaged left shoulder prosthesis appears well aligned. No chest wall mass. Review of the MIP images confirms the above findings. IMPRESSION: 1. No evidence of a pulmonary embolism. 2. No acute findings. 3. Coronary artery calcifications and aortic atherosclerosis. 4. Moderate to large hiatal hernia is stable from the prior CT. Aortic Atherosclerosis (ICD10-I70.0). Electronically Signed   By: Amie Portland M.D.   On: 07/15/2022 12:03   US Venous Img Lower Bilateral  (DVT)  Result Date: 07/15/2022 CLINICAL DATA:  Leg swelling. EXAM: BILATERAL LOWER EXTREMITY VENOUS DOPPLER ULTRASOUND TECHNIQUE: Gray-scale sonography with graded compression, as well as color Doppler and duplex ultrasound were performed to evaluate the lower extremity deep venous systems from the level of the common femoral vein and including the common femoral, femoral, profunda femoral, popliteal and calf veins including the posterior tibial, peroneal and gastrocnemius veins when visible. The superficial great saphenous vein was also interrogated. Spectral Doppler was utilized to evaluate flow at rest and with distal augmentation maneuvers in the common femoral, femoral and popliteal veins. COMPARISON:  08/06/2014 FINDINGS: RIGHT LOWER EXTREMITY Common Femoral Vein: No evidence of thrombus. Normal compressibility, respiratory phasicity and response to augmentation. Saphenofemoral Junction: No evidence of thrombus. Normal compressibility and flow on color Doppler imaging. Profunda Femoral Vein: No evidence of thrombus. Normal compressibility and flow on color Doppler imaging. Femoral Vein: No evidence of thrombus. Normal compressibility, respiratory phasicity and response to augmentation. Popliteal Vein: No evidence of thrombus. Normal compressibility, respiratory phasicity and response to augmentation. Calf Veins: Visualized right deep calf veins are patent without thrombus. Limited evaluation. Other Findings:  None. LEFT LOWER EXTREMITY Common Femoral Vein: No evidence of thrombus. Normal compressibility, respiratory phasicity and response to augmentation. Saphenofemoral Junction: No evidence of thrombus. Normal compressibility and flow on color Doppler imaging. Profunda Femoral Vein: No evidence of thrombus. Normal compressibility and flow on color Doppler imaging. Femoral Vein: No evidence of thrombus. Normal compressibility, respiratory phasicity and response to augmentation. Popliteal Vein: No evidence of  thrombus. Normal compressibility, respiratory phasicity and response to augmentation. Calf Veins: Visualized left deep calf veins are patent without thrombus. Limited evaluation. Other Findings:  None. IMPRESSION: No evidence of deep venous thrombosis in either lower extremity. Electronically Signed   By: Richarda Overlie M.D.   On: 07/15/2022 11:37   DG Chest Port 1 View  Result Date: 07/15/2022 CLINICAL DATA:  82 year old male with history of shortness of breath. EXAM: PORTABLE CHEST 1 VIEW COMPARISON:  Chest x-ray 02/11/2022. FINDINGS: Lung volumes are normal. No consolidative airspace disease. No pleural effusions. No pneumothorax. No evidence of pulmonary edema. Heart size is mildly enlarged. Upper mediastinal contours are within normal limits. Status post  left shoulder arthroplasty. IMPRESSION: 1. No radiographic evidence of acute cardiopulmonary disease. 2. Mild cardiomegaly. Electronically Signed   By: Trudie Reed M.D.   On: 07/15/2022 09:55   CT Lumbar Spine Wo Contrast  Result Date: 07/15/2022 CLINICAL DATA:  82 year old male with low back pain.  Fall. EXAM: CT LUMBAR SPINE WITHOUT CONTRAST TECHNIQUE: Multidetector CT imaging of the lumbar spine was performed without intravenous contrast administration. Multiplanar CT image reconstructions were also generated. RADIATION DOSE REDUCTION: This exam was performed according to the departmental dose-optimization program which includes automated exposure control, adjustment of the mA and/or kV according to patient size and/or use of iterative reconstruction technique. COMPARISON:  Lumbar radiographs 0302 hours today. CT Abdomen and Pelvis 12/30/2021. lumbar MRI 12/07/2017. FINDINGS: Segmentation: Normal, same numbering system used on 2019 lumbar MRI. Alignment: Chronic S-shaped thoracolumbar scoliosis is stable from last year. Levoconvex apex at L3. Relatively maintained lumbar lordosis. Mild chronic retrolisthesis at L2-L3 and similar anterolisthesis of  L5 on S1. Vertebrae: Chronic T12 compression fracture. Chronic L1-L2 interbody ankylosis. Visible lower ribs appear intact. Lumbar levels appear stable and intact. Visible sacrum and SI joints, pelvis appear grossly stable and intact. No acute osseous abnormality identified. Paraspinal and other soft tissues: Visible noncontrast abdominal viscera are stable from last year, including left nephrolithiasis, Aortoiliac calcified atherosclerosis. Lumbar paraspinal soft tissues are stable, remarkable for paraspinal atrophy. Disc levels: Chronic lower thoracic and lumbar spine degeneration superimposed on chronic L1-L2 ankylosis, and fairly capacious underlying spinal canal. Mild multifactorial lower thoracic spinal stenosis at T10-T11. Borderline to mild multifactorial lumbar spinal stenosis at L2-L3, L4-L5. IMPRESSION: 1. No acute traumatic injury identified in the lumbar spine. Chronic T12 compression fracture, L1-L2 ankylosis. 2. Chronic lower thoracic and lumbar spine degeneration superimposed on S-shaped scoliosis. Mild multifactorial spinal stenosis at T10-T11, L2-L3, L4-L5. 3. Visible abdomen stable from CT Abdomen and Pelvis last year, Aortic Atherosclerosis (ICD10-I70.0). Electronically Signed   By: Odessa Fleming M.D.   On: 07/15/2022 06:04   DG Lumbar Spine Complete  Result Date: 07/15/2022 CLINICAL DATA:  Fall, low back pain EXAM: LUMBAR SPINE - COMPLETE 4+ VIEW COMPARISON:  CT abdomen and pelvis 12/30/2021 FINDINGS: Diffuse osteopenia. There is convex leftward scoliosis centered in the mid lumbar spine and convex rightward scoliosis centered in the lower thoracic spine. Advanced degenerative disc and facet disease. Partial fusion across the L1-2 disc space. Moderate compression fracture at T12 is stable since prior CT. No acute fracture. IMPRESSION: Stable moderate chronic T12 compression fracture. Thoracolumbar scoliosis. Associated advanced degenerative disc and facet disease. Diffuse osteopenia. No acute bony  abnormality. Electronically Signed   By: Charlett Nose M.D.   On: 07/15/2022 03:30   CT HEAD WO CONTRAST ( )  Result Date: 07/15/2022 CLINICAL DATA:  Fall EXAM: CT HEAD WITHOUT CONTRAST CT CERVICAL SPINE WITHOUT CONTRAST TECHNIQUE: Multidetector CT imaging of the head and cervical spine was performed following the standard protocol without intravenous contrast. Multiplanar CT image reconstructions of the cervical spine were also generated. RADIATION DOSE REDUCTION: This exam was performed according to the departmental dose-optimization program which includes automated exposure control, adjustment of the mA and/or kV according to patient size and/or use of iterative reconstruction technique. COMPARISON:  08/02/2020 CT head, no prior CT cervical spine available FINDINGS: CT HEAD FINDINGS Brain: No evidence of acute infarct, hemorrhage, mass, mass effect, or midline shift. No hydrocephalus or extra-axial fluid collection. Vascular: No hyperdense vessel. Skull: Negative for fracture or focal lesion. Sinuses/Orbits: Mucosal thickening in the maxillary sinuses and ethmoid air  cells. Osseous thickening in the left sphenoid sinus, likely sequela of chronic sinusitis. Status post bilateral lens replacements. Other: The mastoid air cells are well aerated. CT CERVICAL SPINE FINDINGS Alignment: No traumatic listhesis. Levocurvature of the cervical spine. Reversal of the normal cervical lordosis. Skull base and vertebrae: No acute fracture or suspicious osseous lesion.Osseous fusion across the disc spaces at C4-C7, as well as at T2-T3. Soft tissues and spinal canal: No prevertebral fluid or swelling. No visible canal hematoma. Disc levels: Degenerative changes in the cervical spine. At least mild spinal canal stenosis at C4-C5. Multilevel facet and uncovertebral hypertrophy. Upper chest: No focal pulmonary opacity or pleural effusion. Aortic atherosclerosis. IMPRESSION: 1. No acute intracranial process. 2. No acute fracture  or traumatic listhesis in the cervical spine. 3. Aortic atherosclerosis. Aortic Atherosclerosis (ICD10-I70.0). Electronically Signed   By: Wiliam Ke M.D.   On: 07/15/2022 01:19   CT Cervical Spine Wo Contrast  Result Date: 07/15/2022 CLINICAL DATA:  Fall EXAM: CT HEAD WITHOUT CONTRAST CT CERVICAL SPINE WITHOUT CONTRAST TECHNIQUE: Multidetector CT imaging of the head and cervical spine was performed following the standard protocol without intravenous contrast. Multiplanar CT image reconstructions of the cervical spine were also generated. RADIATION DOSE REDUCTION: This exam was performed according to the departmental dose-optimization program which includes automated exposure control, adjustment of the mA and/or kV according to patient size and/or use of iterative reconstruction technique. COMPARISON:  08/02/2020 CT head, no prior CT cervical spine available FINDINGS: CT HEAD FINDINGS Brain: No evidence of acute infarct, hemorrhage, mass, mass effect, or midline shift. No hydrocephalus or extra-axial fluid collection. Vascular: No hyperdense vessel. Skull: Negative for fracture or focal lesion. Sinuses/Orbits: Mucosal thickening in the maxillary sinuses and ethmoid air cells. Osseous thickening in the left sphenoid sinus, likely sequela of chronic sinusitis. Status post bilateral lens replacements. Other: The mastoid air cells are well aerated. CT CERVICAL SPINE FINDINGS Alignment: No traumatic listhesis. Levocurvature of the cervical spine. Reversal of the normal cervical lordosis. Skull base and vertebrae: No acute fracture or suspicious osseous lesion.Osseous fusion across the disc spaces at C4-C7, as well as at T2-T3. Soft tissues and spinal canal: No prevertebral fluid or swelling. No visible canal hematoma. Disc levels: Degenerative changes in the cervical spine. At least mild spinal canal stenosis at C4-C5. Multilevel facet and uncovertebral hypertrophy. Upper chest: No focal pulmonary opacity or  pleural effusion. Aortic atherosclerosis. IMPRESSION: 1. No acute intracranial process. 2. No acute fracture or traumatic listhesis in the cervical spine. 3. Aortic atherosclerosis. Aortic Atherosclerosis (ICD10-I70.0). Electronically Signed   By: Wiliam Ke M.D.   On: 07/15/2022 01:19      Assessment/Plan Principal Problem:   Fall Active Problems:   Back pain   Oxygen desaturation   CAD (coronary artery disease)   Iron deficiency anemia   Benign prostatic hyperplasia with lower urinary tract symptoms   History of DVT (deep vein thrombosis)   Persistent atrial fibrillation (HCC)   HTN (hypertension)   Alcohol abuse   Assessment and Plan:  Fall and worsening back pain:  CT-head and neck negative for acute injury. CT-L spin showed no acute traumatic injury, but showed chronic T12 compression fracture degenerative disc disease and multiple spinal stenosis. His fall is likely related to alcohol use.  -will place in tele bed for obs -Pain control: As needed Percocet, Tylenol, Lidoderm -As needed Robaxin -Avoid using IV narcotic due to oxygen desaturation  Oxygen desaturation: Etiology is not clear.  It may partially be related to IV  narcotic use. CTA negative for PE.  Lower extremity venous Dopplers negative for DVT.  Patient has bilateral 2+ leg edema, elevated BNP 386, indicating possible undiagnosed diastolic CHF.  2D echo on 02/11/2022 showed EF of 50-55%. -Started IV Lasix 40 mg twice daily -Follow-up 2D echo  CAD (coronary artery disease): No chest pain. --pt can not take NSAIDs due to GIB, will not start ASA  Iron deficiency anemia: Hemoglobin stable, 11.2 -f/u with CBC  Benign prostatic hyperplasia with lower urinary tract symptoms -Flomax  History of DVT (deep vein thrombosis): Patient's not taking anticoagulants.  Lower extremity venous Dopplers negative for DVT today -No acute issues now  Persistent atrial fibrillation (HCC): Heart rate 70s-90s -Continue  Cardizem -Patient is not taking anticoagulants, patient is at high risk of fall  HTN (hypertension):  -IV hydralazine as needed -Cardizem  Alcohol abuse: -CIWA protocol -Did counseling about importance of quitting alcohol use        DVT ppx: SQ Lovenox  Code Status: Full code per pt  Family Communication:   Yes, patient's son  by phone  Disposition Plan:  Anticipate discharge back to previous environment  Consults called:  none  Admission status and Level of care: Telemetry Medical:    for obs    Dispo: The patient is from: Home              Anticipated d/c is to: Home              Anticipated d/c date is: 1 day              Patient currently is not medically stable to d/c.    Severity of Illness:  The appropriate patient status for this patient is OBSERVATION. Observation status is judged to be reasonable and necessary in order to provide the required intensity of service to ensure the patient's safety. The patient's presenting symptoms, physical exam findings, and initial radiographic and laboratory data in the context of their medical condition is felt to place them at decreased risk for further clinical deterioration. Furthermore, it is anticipated that the patient will be medically stable for discharge from the hospital within 2 midnights of admission.        Date of Service 07/15/2022    Lorretta Harp [facility]   If 7PM-7AM, please contact night-coverage [email or web address] 07/15/2022, 1:39 PM

## 2022-07-15 NOTE — ED Notes (Signed)
Repositioned pt in bed with breakfast tray. Pt is on room air now.

## 2022-07-15 NOTE — ED Notes (Signed)
Pt sleeping. 

## 2022-07-16 ENCOUNTER — Other Ambulatory Visit: Payer: Non-veteran care

## 2022-07-16 DIAGNOSIS — N400 Enlarged prostate without lower urinary tract symptoms: Secondary | ICD-10-CM | POA: Diagnosis not present

## 2022-07-16 DIAGNOSIS — F1010 Alcohol abuse, uncomplicated: Secondary | ICD-10-CM

## 2022-07-16 DIAGNOSIS — E785 Hyperlipidemia, unspecified: Secondary | ICD-10-CM | POA: Diagnosis present

## 2022-07-16 DIAGNOSIS — Z9049 Acquired absence of other specified parts of digestive tract: Secondary | ICD-10-CM | POA: Diagnosis not present

## 2022-07-16 DIAGNOSIS — R2689 Other abnormalities of gait and mobility: Secondary | ICD-10-CM | POA: Diagnosis not present

## 2022-07-16 DIAGNOSIS — M5135 Other intervertebral disc degeneration, thoracolumbar region: Secondary | ICD-10-CM | POA: Diagnosis not present

## 2022-07-16 DIAGNOSIS — N401 Enlarged prostate with lower urinary tract symptoms: Secondary | ICD-10-CM | POA: Diagnosis present

## 2022-07-16 DIAGNOSIS — M546 Pain in thoracic spine: Secondary | ICD-10-CM | POA: Diagnosis not present

## 2022-07-16 DIAGNOSIS — I10 Essential (primary) hypertension: Secondary | ICD-10-CM | POA: Diagnosis not present

## 2022-07-16 DIAGNOSIS — I4811 Longstanding persistent atrial fibrillation: Secondary | ICD-10-CM | POA: Diagnosis not present

## 2022-07-16 DIAGNOSIS — E8779 Other fluid overload: Secondary | ICD-10-CM | POA: Diagnosis present

## 2022-07-16 DIAGNOSIS — T501X6A Underdosing of loop [high-ceiling] diuretics, initial encounter: Secondary | ICD-10-CM | POA: Diagnosis present

## 2022-07-16 DIAGNOSIS — D509 Iron deficiency anemia, unspecified: Secondary | ICD-10-CM | POA: Diagnosis not present

## 2022-07-16 DIAGNOSIS — D508 Other iron deficiency anemias: Secondary | ICD-10-CM | POA: Diagnosis not present

## 2022-07-16 DIAGNOSIS — G8911 Acute pain due to trauma: Secondary | ICD-10-CM | POA: Diagnosis present

## 2022-07-16 DIAGNOSIS — R110 Nausea: Secondary | ICD-10-CM | POA: Diagnosis not present

## 2022-07-16 DIAGNOSIS — M6281 Muscle weakness (generalized): Secondary | ICD-10-CM | POA: Diagnosis not present

## 2022-07-16 DIAGNOSIS — M5442 Lumbago with sciatica, left side: Secondary | ICD-10-CM | POA: Diagnosis not present

## 2022-07-16 DIAGNOSIS — K219 Gastro-esophageal reflux disease without esophagitis: Secondary | ICD-10-CM | POA: Diagnosis not present

## 2022-07-16 DIAGNOSIS — M5136 Other intervertebral disc degeneration, lumbar region: Secondary | ICD-10-CM | POA: Diagnosis present

## 2022-07-16 DIAGNOSIS — M5441 Lumbago with sciatica, right side: Secondary | ICD-10-CM | POA: Diagnosis not present

## 2022-07-16 DIAGNOSIS — R600 Localized edema: Secondary | ICD-10-CM | POA: Diagnosis not present

## 2022-07-16 DIAGNOSIS — I4819 Other persistent atrial fibrillation: Secondary | ICD-10-CM | POA: Diagnosis not present

## 2022-07-16 DIAGNOSIS — M48061 Spinal stenosis, lumbar region without neurogenic claudication: Secondary | ICD-10-CM | POA: Diagnosis present

## 2022-07-16 DIAGNOSIS — G8929 Other chronic pain: Secondary | ICD-10-CM | POA: Diagnosis present

## 2022-07-16 DIAGNOSIS — M4854XA Collapsed vertebra, not elsewhere classified, thoracic region, initial encounter for fracture: Secondary | ICD-10-CM | POA: Diagnosis present

## 2022-07-16 DIAGNOSIS — R0902 Hypoxemia: Secondary | ICD-10-CM

## 2022-07-16 DIAGNOSIS — M4854XD Collapsed vertebra, not elsewhere classified, thoracic region, subsequent encounter for fracture with routine healing: Secondary | ICD-10-CM | POA: Diagnosis not present

## 2022-07-16 DIAGNOSIS — K5900 Constipation, unspecified: Secondary | ICD-10-CM | POA: Diagnosis not present

## 2022-07-16 DIAGNOSIS — I5031 Acute diastolic (congestive) heart failure: Secondary | ICD-10-CM | POA: Diagnosis not present

## 2022-07-16 DIAGNOSIS — M549 Dorsalgia, unspecified: Secondary | ICD-10-CM | POA: Diagnosis present

## 2022-07-16 DIAGNOSIS — Z86718 Personal history of other venous thrombosis and embolism: Secondary | ICD-10-CM | POA: Diagnosis not present

## 2022-07-16 DIAGNOSIS — I2510 Atherosclerotic heart disease of native coronary artery without angina pectoris: Secondary | ICD-10-CM | POA: Diagnosis present

## 2022-07-16 DIAGNOSIS — M4805 Spinal stenosis, thoracolumbar region: Secondary | ICD-10-CM | POA: Diagnosis not present

## 2022-07-16 DIAGNOSIS — M545 Low back pain, unspecified: Secondary | ICD-10-CM | POA: Diagnosis not present

## 2022-07-16 DIAGNOSIS — M419 Scoliosis, unspecified: Secondary | ICD-10-CM | POA: Diagnosis present

## 2022-07-16 DIAGNOSIS — Z1152 Encounter for screening for COVID-19: Secondary | ICD-10-CM | POA: Diagnosis not present

## 2022-07-16 DIAGNOSIS — Z7901 Long term (current) use of anticoagulants: Secondary | ICD-10-CM | POA: Diagnosis not present

## 2022-07-16 DIAGNOSIS — Z9181 History of falling: Secondary | ICD-10-CM | POA: Diagnosis not present

## 2022-07-16 LAB — BASIC METABOLIC PANEL
Anion gap: 7 (ref 5–15)
BUN: 24 mg/dL — ABNORMAL HIGH (ref 8–23)
CO2: 32 mmol/L (ref 22–32)
Calcium: 8.6 mg/dL — ABNORMAL LOW (ref 8.9–10.3)
Chloride: 101 mmol/L (ref 98–111)
Creatinine, Ser: 0.82 mg/dL (ref 0.61–1.24)
GFR, Estimated: >60 mL/min (ref >60)
Glucose, Bld: 98 mg/dL (ref 70–99)
Potassium: 3.9 mmol/L (ref 3.5–5.1)
Sodium: 140 mmol/L (ref 135–145)

## 2022-07-16 LAB — CBC
HCT: 35.5 % — ABNORMAL LOW (ref 39.0–52.0)
Hemoglobin: 11.1 g/dL — ABNORMAL LOW (ref 13.0–17.0)
MCH: 29.8 pg (ref 26.0–34.0)
MCHC: 31.3 g/dL (ref 30.0–36.0)
MCV: 95.2 fL (ref 80.0–100.0)
Platelets: 177 10*3/uL (ref 150–400)
RBC: 3.73 MIL/uL — ABNORMAL LOW (ref 4.22–5.81)
RDW: 18.3 % — ABNORMAL HIGH (ref 11.5–15.5)
WBC: 5.8 10*3/uL (ref 4.0–10.5)
nRBC: 0 % (ref 0.0–0.2)

## 2022-07-16 LAB — MAGNESIUM: Magnesium: 2.1 mg/dL (ref 1.7–2.4)

## 2022-07-16 MED ORDER — ACETAMINOPHEN 500 MG PO TABS
1000.0000 mg | ORAL_TABLET | Freq: Three times a day (TID) | ORAL | Status: DC
  Administered 2022-07-16 – 2022-07-21 (×15): 1000 mg via ORAL
  Filled 2022-07-16 (×15): qty 2

## 2022-07-16 MED ORDER — OXYCODONE HCL 5 MG PO TABS
5.0000 mg | ORAL_TABLET | ORAL | Status: DC | PRN
  Administered 2022-07-17 – 2022-07-20 (×5): 10 mg via ORAL
  Filled 2022-07-16 (×5): qty 2

## 2022-07-16 MED ORDER — MORPHINE SULFATE (PF) 2 MG/ML IV SOLN: 2.0000 mg | INTRAVENOUS | Status: DC | PRN

## 2022-07-16 NOTE — Progress Notes (Signed)
Progress Note   Patient: Marcus Coleman:[id number] DOB: 1940-08-25 DOA: 07/15/2022     0 DOS: the patient was seen and examined on 07/16/2022   Brief hospital course: HPI on admission 07/15/22, per Dr. Clyde Lundborg: "Marcus Coleman is a 82 y.o. male with medical history significant of hypertension, hyperlipidemia, CAD, GERD, GI bleeding, BPH, anemia, atrial fibrillation, DVT not on anticoagulants, chronic back pain, who presents with fall, back pain.   Pt states that he had appx one pint of wine with dinner around 1900 yesterday. Patient thinks he may have fallen asleep in his computer chair and struck his back after fall.  He has chronic back pain, which has worsened significantly.  The pain is located in middle and lower back, constant, sharp, severe, nonradiating.  It is aggravated by movement. No leg numbness or weakness.  No incontinence.  Denies head or neck injury.  Patient denies shortness of breath, but was found to have oxygen desaturation to 82% on room air, which increased to 99% on 2 to 3 L oxygen.  Patient initially had 95% of oxygen saturation in room air. His oxygen desaturation happened after receiving morphine in ED.  ...  Patient has bilateral lower leg edema..."  Patient was admitted to the hospital for further evaluation and management.    CT of Lumbar spine showed a chronic T12 compression fracture, S-shaped scoliosis and multi-level DDD and spinal stenosis  He was started on IV Lasix and Echo ordered given signs of volume overload.  Pt later reported missing doses of his home diuretic.  Seen by PT and OT who are recommending SNF for rehab at discharge.   Assessment and Plan: * Fall Acute on Chronic Back Pain CT-head and neck negative for acute injury. CT-L spine showed no acute traumatic injury, but showed chronic T12 compression fracture degenerative disc disease and multilevel spinal stenosis.  --Pain control: scheduled Tylenol, PRN oxycodone 5-10 mg, IV morphine for  breakthrough. --Caution using IV morphine -- pt had transient O2 desat in ED after morphine - monitor closely if given, supplement O2 if sats < 90% --As needed Robaxin --PT/OT evaluations --Anticipate need for rehab at d/c --Fall precautions  Back pain Acute on chronic Lumbar degenerative disc disease Lumbar spinal stenosis --Pain control as outlined --PT/OT  Oxygen desaturation Occurred in ED after given IV morphine.  CTA negative for PE.   BLE doppler U/S negative for DVT.   Patient has bilateral 2+ leg edema, elevated BNP 386, indicating possible undiagnosed diastolic CHF.  Echo on 02/11/2022 showed EF of 50-55%. --Started IV Lasix 40 mg twice daily - continue --Follow pending Echo --Strict I/O's, daily weights --Supplement O2, goal spO2 90-94%, wean as tolerated  CAD (coronary artery disease) Stable. No chest pain. --Not on ASA due to hx of GI bleeds  Iron deficiency anemia Hbg stable. Monitor CBC.  Benign prostatic hyperplasia with lower urinary tract symptoms --Flomax  History of DVT (deep vein thrombosis) Not on anticoagulants.   No DVT or PE on dopplers and CTA chest this admission. --Lovenox for DVT prophylaxis  Persistent atrial fibrillation (HCC) Rate controlled --Continue Cardizem --Not on anticoagulation due to hx of GI bleeding, falls risk  HTN (hypertension) --IV hydralazine as needed --Continue Cardizem  Alcohol abuse Monitor on CIWA PRN ativan for withdrawal signs/symptoms Counseled on moderation / cessation        Subjective: Pt awake resting in bed laying flat when seen this AM.  He reports ongoing back pain, not too severe when laying  in bed, but does not tolerate sitting upright.  He reports occasionally delaying or skipping his diuretic to avoid urgency to void while at appointments or out of the home.  He recently missed at least a few doses he thinks.  No other acute complaints.  States he feels like he'll need rehab after hospital  before returning home.  Physical Exam: Vitals:   07/15/22 1545 07/15/22 1548 07/16/22 0051 07/16/22 0757  BP:  127/78 116/71 112/66  Pulse:  83 76 92  Resp:  16 18 16   Temp:  98 F (36.7 C) 98.1 F (36.7 C) 97.9 F (36.6 C)  TempSrc:      SpO2:  97% 95% 92%  Height: 5\' 6"  (1.676 m)      General exam: awake, alert, no acute distress HEENT: atraumatic, clear conjunctiva, anicteric sclera, moist mucus membranes, hearing grossly normal  Respiratory system: CTAB, no wheezes, rales or rhonchi, normal respiratory effort. Cardiovascular system: normal S1/S2, RRR,2-3+ BLE edema.   Gastrointestinal system: soft, NT, ND, no HSM felt, +bowel sounds. Central nervous system: A&O x3. no gross focal neurologic deficits, normal speech Extremities: moves all, no edema, normal tone Skin: dry, intact, normal temperature, normal color, No rashes, lesions or ulcers Psychiatry: normal mood, congruent affect, judgement and insight appear normal   Data Reviewed:  Notable labs --  BUN 24, Ca 8.6 otherwise normal BMP.  CBC with Hgb stable 11.1  Family Communication: None present, will attempt to call wife  Disposition: Status is: Observation The patient remains OBS appropriate and will d/c before 2 midnights. Remains admitted for further IV diuresis and pending Echo. Requires SNF/rehab placement.   Planned Discharge Destination: Skilled nursing facility vs [facility]    Time spent: 46 minutes  Author: Pennie Banter, DO 07/16/2022 11:05 AM  For on call review [email or web address].

## 2022-07-16 NOTE — Assessment & Plan Note (Signed)
Rate controlled --Continue Cardizem --Not on anticoagulation due to hx of GI bleeding, falls risk

## 2022-07-16 NOTE — Evaluation (Addendum)
Physical Therapy Evaluation Patient Details Name: Marcus Coleman MRN: [id number] DOB: 01-26-1941 Today's Date: 07/16/2022  History of Present Illness  Marcus Coleman is a 82 y.o. male with medical history significant of hypertension, hyperlipidemia, CAD, GERD, GI bleeding, BPH, anemia, atrial fibrillation, DVT not on anticoagulants, chronic back pain, who presents with fall, back pain.   Clinical Impression  Patient alert and oriented upon arrival and able to provide detailed history. PT/OT co-evaluation today due for improved patient/clinician safety and due to complexity of patient's condition. Patient reports he lives with his wife in a single story home with 3 steps to enter. At baseline he is I with ADLs and IADLs, drives, and ambulates with a SPC. He reports 2 falls in the last 6 months (both with hospitalization). He reports chronic low back pain at baseline that is up to 3/10 when getting out of bed. Upon PT evaluation, patient required mod A +2 for supine to sit and min A +2 for supine to sit due to extreme pain in his low back at approx L5 region. He was unable to tolerate sitting at edge of bed for more than about 30 seconds while his shirt was being removed and immediately asked to be returned to lying position stating he felt he was going to pass out from the pain. He also had pain when moving his legs and with dependent boosting in the bed. Patient required B UE support and clinician support to remain sitting and prevent himself from falling backwards. He was very cooperative and appeared motivated but was unable to progress to a transfer due to pain. He was able to complete rolling to each side with minimal discomfort and supervision, and is pain returned to near zero with rest in supine. Patient appears to have experienced a decline in functional independence and mobility and would benefit from skilled physical therapy to address impairments and functional limitations (see PT Problem List  below) to work towards stated goals and return to PLOF or maximal functional independence.       Recommendations for follow up therapy are one component of a multi-disciplinary discharge planning process, led by the attending physician.  Recommendations may be updated based on patient status, additional functional criteria and insurance authorization.  Follow Up Recommendations Can patient physically be transported by private vehicle: No     Assistance Recommended at Discharge Intermittent Supervision/Assistance  Patient can return home with the following  Two people to help with walking and/or transfers;Assistance with cooking/housework;Direct supervision/assist for medications management;Assist for transportation;Help with stairs or ramp for entrance;Two people to help with bathing/dressing/bathroom    Equipment Recommendations Rolling walker (2 wheels);BSC/3in1 (pateint may benefit from this DME depending on his progress)  Recommendations for Other Services       Functional Status Assessment Patient has had a recent decline in their functional status and demonstrates the ability to make significant improvements in function in a reasonable and predictable amount of time.     Precautions / Restrictions Precautions Precautions: Fall Restrictions Weight Bearing Restrictions: No      Mobility  Bed Mobility Overal bed mobility: Modified Independent, Needs Assistance Bed Mobility: Supine to Sit, Sit to Supine, Rolling Rolling: Supervision   Supine to sit: Mod assist, +2 for safety/equipment, +2 for physical assistance Sit to supine: +2 for safety/equipment, Min assist   General bed mobility comments: Patient required cuing for log roll technique, and support at trunk for supine <> sit. He sat ~ 30 seconds edge of bed with  B UE support and clinician support to prevent posterior lean and requested immediately to lay back down due to low back pain he stated made him feel like he would  pass out. He was able to complete rolling side to side with min pain.    Transfers                   General transfer comment: unable to attempt due to pain in sitting    Ambulation/Gait               General Gait Details: unable to attempt ambulation due to pain with sitting  Stairs            Wheelchair Mobility    Modified Rankin (Stroke Patients Only)       Balance Overall balance assessment: Needs assistance Sitting-balance support: Bilateral upper extremity supported, Feet unsupported Sitting balance-Leahy Scale: Poor Sitting balance - Comments: Patient able to sit at edge of bed approximately 30 seconds with B UE support and support from clinician to prevent posterior lean second to pain in seated position. Postural control: Posterior lean   Standing balance-Leahy Scale: Zero Standing balance comment: unable to attempt stand second to pain                             Pertinent Vitals/Pain Pain Assessment Pain Assessment: 0-10 Pain Score: 9  Faces Pain Scale: Hurts whole lot (has had more back pain lately before fall) Pain Location: low back a approx L5; with seated position, when moving legs, during dependent scoot up. Pain Descriptors / Indicators: Discomfort, Grimacing, Guarding, Moaning Pain Intervention(s): Limited activity within patient's tolerance, Monitored during session, Repositioned (asked nursing to offer pateint meds prior to session)    Home Living Family/patient expects to be discharged to:: Private residence Living Arrangements: Spouse/significant other Available Help at Discharge: Family;Available 24 hours/day (wife available most of the time) Type of Home: House Home Access: Stairs to enter Entrance Stairs-Rails: Doctor, general practice of Steps: 3   Home Layout: One level Home Equipment: Cane - single point;Shower seat - built in;Grab bars - tub/shower;Shower seat;Hand held shower head Additional  Comments: recliner with heat and vibration. long shoe horn.    Prior Function Prior Level of Function : History of Falls (last six months);Driving;Independent/Modified Independent             Mobility Comments: uses SPC for mobility. Usually doesn't need it inside but uses it more on uneven ground. 2 falls in the last 6 months. Pt does endorse chronic back pain, but has become more severe since fall leading to admission. Pt states at baseline his back pain is 3/10; today it is 9/10 while seated. ADLs Comments: Independent with ADLS and ADLS. Hobbies: photography, target shooter, ammunition reloading, wildlife club. Wife does cooking. He does some repairs and puts things int he dishwasher. Has been a little more limited recently. Sleeps in recliner because at the end of November he had a reverse shoulder replacement. Pt has hx of enjoying scuba diving. Pt states the wears Depends briefs at baseline 2/2 urine urgency/leaking.     Hand Dominance   Dominant Hand: Right    Extremity/Trunk Assessment   Upper Extremity Assessment Upper Extremity Assessment: Defer to OT evaluation    Lower Extremity Assessment Lower Extremity Assessment: Overall WFL for tasks assessed (B LE appear WFL except pain with lifting legs from bed)    Cervical / Trunk Assessment  Cervical / Trunk Assessment:  (limited by pain at approx L5)  Communication   Communication: No difficulties  Cognition Arousal/Alertness: Awake/alert Behavior During Therapy: WFL for tasks assessed/performed                                   General Comments: pleasant and cooperative        General Comments General comments (skin integrity, edema, etc.): On room air. Purewick in place throughout session. Soiled brief removed during session.    Exercises Other Exercises Other Exercises: educated patient on long roll technique   Assessment/Plan    PT Assessment Patient needs continued PT services  PT Problem  List Decreased balance;Pain;Decreased range of motion;Decreased mobility;Decreased activity tolerance       PT Treatment Interventions DME instruction;Functional mobility training;Balance training;Patient/family education;Gait training;Therapeutic activities;Neuromuscular re-education;Stair training;Therapeutic exercise    PT Goals (Current goals can be found in the Care Plan section)  Acute Rehab PT Goals Patient Stated Goal: get better PT Goal Formulation: With patient Time For Goal Achievement: 07/30/22 Potential to Achieve Goals: Good    Frequency Min 4X/week     Co-evaluation PT/OT/SLP Co-Evaluation/Treatment: Yes Reason for Co-Treatment: Complexity of the patient's impairments (multi-system involvement);For patient/therapist safety;To address functional/ADL transfers PT goals addressed during session: Mobility/safety with mobility;Proper use of DME;Balance;Strengthening/ROM OT goals addressed during session: ADL's and self-care;Strengthening/ROM       AM-PAC PT "6 Clicks" Mobility  Outcome Measure Help needed turning from your back to your side while in a flat bed without using bedrails?: A Little Help needed moving from lying on your back to sitting on the side of a flat bed without using bedrails?: A Lot Help needed moving to and from a bed to a chair (including a wheelchair)?: Total Help needed standing up from a chair using your arms (e.g., wheelchair or bedside chair)?: Total Help needed to walk in hospital room?: Total Help needed climbing 3-5 steps with a railing? : Total 6 Click Score: 9    End of Session   Activity Tolerance: Patient limited by pain Patient left: in bed;with bed alarm set;with call bell/phone within reach Nurse Communication: Mobility status (patient requests hot pack) PT Visit Diagnosis: Pain;Difficulty in walking, not elsewhere classified (R26.2) Pain - part of body:  (low back pain)    Time: 1610-9604 PT Time Calculation (min) (ACUTE  ONLY): 27 min   Charges:   PT Evaluation $PT Eval Moderate Complexity: 1 Mod          Oliviah Agostini R. Ilsa Iha, PT, DPT 07/16/22, 10:31 AM

## 2022-07-16 NOTE — Assessment & Plan Note (Signed)
-   IV hydralazine as needed -Continue Cardizem 

## 2022-07-16 NOTE — Evaluation (Signed)
Occupational Therapy Evaluation Patient Details Name: Marcus Coleman MRN: [id number] DOB: 1940/10/11 Today's Date: 07/16/2022   History of Present Illness Marcus Coleman is a 82 y.o. male with medical history significant of hypertension, hyperlipidemia, CAD, GERD, GI bleeding, BPH, anemia, atrial fibrillation, DVT not on anticoagulants, chronic back pain, who presents with fall, back pain.   Clinical Impression   Patient received for OT evaluation. See flowsheet below for details of function. Generally, patient requiring MOD A +2 for bed mobility, unable to perform functional mobility, and overall MAX A- dependent for ADLs; pt is essentially bedbound at this time given his intolerable back pain while seated EOB. Patient will benefit from continued OT while in acute care.       Recommendations for follow up therapy are one component of a multi-disciplinary discharge planning process, led by the attending physician.  Recommendations may be updated based on patient status, additional functional criteria and insurance authorization.   Assistance Recommended at Discharge Frequent or constant Supervision/Assistance  Patient can return home with the following Two people to help with walking and/or transfers;A lot of help with bathing/dressing/bathroom;Assistance with cooking/housework;Direct supervision/assist for medications management;Direct supervision/assist for financial management;Assist for transportation;Help with stairs or ramp for entrance    Functional Status Assessment  Patient has had a recent decline in their functional status and demonstrates the ability to make significant improvements in function in a reasonable and predictable amount of time.  Equipment Recommendations  Other (comment) (defer to next venue of care)    Recommendations for Other Services       Precautions / Restrictions Precautions Precautions: Fall Restrictions Weight Bearing Restrictions: No      Mobility  Bed Mobility Overal bed mobility: Needs Assistance Bed Mobility: Supine to Sit, Sit to Supine, Rolling Rolling: Supervision (with use of rails and cues for log roll technique)   Supine to sit: Mod assist, +2 for safety/equipment, +2 for physical assistance Sit to supine: Min assist, +2 for physical assistance   General bed mobility comments: Cues for log roll technique today; Pt able to sit EOB for approx 30 seconds at EOB, but posterior lean, requiring MOD A for sitting balance, and asking to lay back down, stating he feels like he will pass out 2/2 low back pain. Pain immediately relieved upon laying down.    Transfers                   General transfer comment: unable to attempt standing today 2/2 pain in sitting being too severe to tolerate.      Balance Overall balance assessment: Needs assistance Sitting-balance support: Bilateral upper extremity supported Sitting balance-Leahy Scale: Poor Sitting balance - Comments: Very limited 2/2 pain.                                   ADL either performed or assessed with clinical judgement   ADL Overall ADL's : Needs assistance/impaired                                       General ADL Comments: Pt severely limited in ADL capacity at this time 2/2 inability to sit upright due to severe back pain. Pt requiring MOD A for sitting balance while doffing shirt at EOB to don gown, pt in severe pain in sitting. UE functioning well, but pt  needing to use UE for sitting balance at EOB. Anticipate able to self-feed and groom from semi-reclined in bed, but unable to do any LB dressing due to decreased sitting tolerance or bending tolerance. Pt right now is bedbound due to his significant back pain with transfers.     Vision         Perception     Praxis      Pertinent Vitals/Pain Pain Assessment Pain Assessment: 0-10 Pain Score: 9  Pain Location: low back a approx L5; with seated position, when moving  legs, during dependent scoot up. Pt denies pain when supine in bed Pain Descriptors / Indicators: Discomfort, Grimacing, Guarding, Moaning Pain Intervention(s): Limited activity within patient's tolerance, Monitored during session, Premedicated before session     Hand Dominance Right   Extremity/Trunk Assessment Upper Extremity Assessment Upper Extremity Assessment: Defer to OT evaluation   Lower Extremity Assessment Lower Extremity Assessment: Overall WFL for tasks assessed (B LE appear WFL except pain with lifting legs from bed)   Cervical / Trunk Assessment Cervical / Trunk Assessment:  (limited by pain at approx L5)   Communication Communication Communication: No difficulties   Cognition Arousal/Alertness: Awake/alert Behavior During Therapy: WFL for tasks assessed/performed Overall Cognitive Status: Within Functional Limits for tasks assessed                                 General Comments: Pleasant; trying as hard as he can, but significant pain.     General Comments  On room air. Purewick in place throughout session. Soiled brief removed during session.    Exercises     Shoulder Instructions      Home Living Family/patient expects to be discharged to:: Private residence Living Arrangements: Spouse/significant other Available Help at Discharge: Family;Available 24 hours/day (wife available most of the time) Type of Home: House Home Access: Stairs to enter [employer] of Steps: 3 Entrance Stairs-Rails: Right;Left Home Layout: One level     Bathroom Shower/Tub: Producer, television/film/video: Handicapped height     Home Equipment: Cane - single point;Shower seat - built in;Grab bars - tub/shower;Shower seat;Hand held shower head   Additional Comments: recliner with heat and vibration. long shoe horn.      Prior Functioning/Environment Prior Level of Function : History of Falls (last six months);Driving;Independent/Modified  Independent             Mobility Comments: uses SPC for mobility. Usually doesn't need it inside but uses it more on uneven ground. 2 falls in the last 6 months. Pt does endorse chronic back pain, but has become more severe since fall leading to admission. Pt states at baseline his back pain is 3/10; today it is 9/10 while seated. ADLs Comments: Independent with ADLS and ADLS. Hobbies: photography, target shooter, ammunition reloading, wildlife club. Wife does cooking. He does some repairs and puts things int he dishwasher. Has been a little more limited recently. Sleeps in recliner because at the end of November he had a reverse shoulder replacement. Pt has hx of enjoying scuba diving. Pt states the wears Depends briefs at baseline 2/2 urine urgency/leaking.        OT Problem List: Decreased activity tolerance;Impaired balance (sitting and/or standing);Pain      OT Treatment/Interventions: Self-care/ADL training;Therapeutic exercise;Therapeutic activities;Patient/family education    OT Goals(Current goals can be found in the care plan section) Acute Rehab OT Goals Patient Stated Goal: Pain to  improve OT Goal Formulation: With patient Time For Goal Achievement: 07/30/22 Potential to Achieve Goals: Good ADL Goals Pt Will Perform Grooming: with set-up;standing Pt Will Perform Upper Body Dressing: with set-up;sitting Pt Will Perform Lower Body Dressing: with set-up;sit to/from stand Pt Will Transfer to Toilet: with modified independence;ambulating;regular height toilet Pt Will Perform Toileting - Clothing Manipulation and hygiene: with modified independence;sit to/from stand  OT Frequency: Min 3X/week    Co-evaluation PT/OT/SLP Co-Evaluation/Treatment: Yes Reason for Co-Treatment: Complexity of the patient's impairments (multi-system involvement);For patient/therapist safety;To address functional/ADL transfers PT goals addressed during session: Mobility/safety with mobility;Proper use  of DME;Balance;Strengthening/ROM OT goals addressed during session: ADL's and self-care;Strengthening/ROM      AM-PAC OT "6 Clicks" Daily Activity     Outcome Measure Help from another person eating meals?: None Help from another person taking care of personal grooming?: A Little Help from another person toileting, which includes using toliet, bedpan, or urinal?: Total Help from another person bathing (including washing, rinsing, drying)?: Total Help from another person to put on and taking off regular upper body clothing?: A Lot Help from another person to put on and taking off regular lower body clothing?: Total 6 Click Score: 12   End of Session Nurse Communication: Mobility status;Other (comment) (need for hot pack for back pain)  Activity Tolerance: Patient limited by pain Patient left: in bed;with call bell/phone within reach;with bed alarm set (breakfast set up in front of him)  OT Visit Diagnosis: Pain Pain - part of body:  (back)                Time: 5784-6962 OT Time Calculation (min): 28 min Charges:  OT General Charges $OT Visit: 1 Visit OT Evaluation $OT Eval Moderate Complexity: 1 Mod  Kori Goins Junie Panning, MS, OTR/L  Alvester Morin 07/16/2022, 10:20 AM

## 2022-07-16 NOTE — Assessment & Plan Note (Addendum)
No withdrawal signs.  On folic acid and thiamine

## 2022-07-16 NOTE — Assessment & Plan Note (Addendum)
Not on anticoagulants.   No DVT or PE on dopplers and CTA chest this admission. --Lovenox for DVT prophylaxis

## 2022-07-16 NOTE — Plan of Care (Signed)

## 2022-07-16 NOTE — Assessment & Plan Note (Signed)
Acute on Chronic Back Pain CT-head and neck negative for acute injury. CT-L spine showed no acute traumatic injury, but showed chronic T12 compression fracture degenerative disc disease and multilevel spinal stenosis.  --Pain control: scheduled Tylenol, PRN oxycodone 5-10 mg, IV morphine for breakthrough. --Caution using IV morphine -- pt had transient O2 desat in ED after morphine - monitor closely if given, supplement O2 if sats < 90% --Make Robaxin PRN >> scheudled --PT/OT evaluations --Anticipate need for rehab at d/c --Fall precautions --Requested Neurosurgery's input, formally consult if indicated

## 2022-07-16 NOTE — Assessment & Plan Note (Signed)
Occurred in ED after given IV morphine.  CTA negative for PE.   BLE doppler U/S negative for DVT.   Resolved.

## 2022-07-16 NOTE — Progress Notes (Signed)
Attempted Echo. Pt. Refused. Wanted to order breakfast and eat first.  Dondra Prader RVT RCS

## 2022-07-16 NOTE — Assessment & Plan Note (Signed)
Stable. No chest pain. --Not on ASA due to hx of GI bleeds

## 2022-07-16 NOTE — Assessment & Plan Note (Signed)
-   Flomax 

## 2022-07-16 NOTE — Assessment & Plan Note (Addendum)
Last hemoglobin 11.8

## 2022-07-16 NOTE — Hospital Course (Addendum)
HPI on admission 07/15/22, per Dr. Clyde Lundborg: "Marcus Coleman is a 82 y.o. male with medical history significant of hypertension, hyperlipidemia, CAD, GERD, GI bleeding, BPH, anemia, atrial fibrillation, DVT not on anticoagulants, chronic back pain, who presents with fall, back pain.   Pt states that he had appx one pint of wine with dinner around 1900 yesterday. Patient thinks he may have fallen asleep in his computer chair and struck his back after fall.  He has chronic back pain, which has worsened significantly.  The pain is located in middle and lower back, constant, sharp, severe, nonradiating.  It is aggravated by movement. No leg numbness or weakness.  No incontinence.  Denies head or neck injury.  Patient denies shortness of breath, but was found to have oxygen desaturation to 82% on room air, which increased to 99% on 2 to 3 L oxygen.  Patient initially had 95% of oxygen saturation in room air. His oxygen desaturation happened after receiving morphine in ED.  ...  Patient has bilateral lower leg edema..."  Patient was admitted to the hospital for further evaluation and management.    CT of Lumbar spine showed a chronic T12 compression fracture, S-shaped scoliosis and multi-level DDD and spinal stenosis  He was started on IV Lasix and Echo ordered given signs of volume overload.  Pt later reported missing doses of his home diuretic.  Seen by PT and OT who are recommending SNF for rehab at discharge.   5/9.  Patient feeling nauseous this morning.  Reviewed MRI of the thoracic spine with the patient and shows no acute abnormality of the thoracic spine.  Chronic compression deformity of T12 and multilevel degenerative disc disease seen.  Patient walked better with physical therapy in the afternoon walking 35 feet. 5/10.  Patient stable to go out to rehab.

## 2022-07-16 NOTE — Assessment & Plan Note (Addendum)
Patient's back pain is a little higher than T12 with a chronic compression fracture.   Patient having pains down both of his legs.  MRI of the thoracic spine negative.  Did better with PT yesterday walking 35 feet.  Patient with less pain today.

## 2022-07-16 NOTE — Plan of Care (Signed)
  Problem: Education: Goal: Knowledge of General Education information will improve Description: Including pain rating scale, medication(s)/side effects and non-pharmacologic comfort measures Outcome: Progressing   Problem: Health Behavior/Discharge Planning: Goal: Ability to manage health-related needs will improve Outcome: Progressing   Problem: Clinical Measurements: Goal: Ability to maintain clinical measurements within normal limits will improve Outcome: Progressing Goal: Will remain free from infection Outcome: Progressing Goal: Diagnostic test results will improve Outcome: Progressing   Problem: Nutrition: Goal: Adequate nutrition will be maintained Outcome: Progressing   Problem: Pain Managment: Goal: General experience of comfort will improve Outcome: Progressing   

## 2022-07-17 ENCOUNTER — Inpatient Hospital Stay
Admit: 2022-07-17 | Discharge: 2022-07-17 | Disposition: A | Discharge status: Home / Self Care | Payer: No Typology Code available for payment source | Attending: Internal Medicine | Admitting: Internal Medicine

## 2022-07-17 DIAGNOSIS — M5441 Lumbago with sciatica, right side: Secondary | ICD-10-CM

## 2022-07-17 DIAGNOSIS — M5442 Lumbago with sciatica, left side: Secondary | ICD-10-CM | POA: Diagnosis not present

## 2022-07-17 DIAGNOSIS — R0902 Hypoxemia: Secondary | ICD-10-CM | POA: Diagnosis not present

## 2022-07-17 DIAGNOSIS — I4819 Other persistent atrial fibrillation: Secondary | ICD-10-CM | POA: Diagnosis not present

## 2022-07-17 LAB — BASIC METABOLIC PANEL
Anion gap: 12 (ref 5–15)
BUN: 22 mg/dL (ref 8–23)
CO2: 28 mmol/L (ref 22–32)
Calcium: 8.7 mg/dL — ABNORMAL LOW (ref 8.9–10.3)
Chloride: 95 mmol/L — ABNORMAL LOW (ref 98–111)
Creatinine, Ser: 0.74 mg/dL (ref 0.61–1.24)
GFR, Estimated: >60 mL/min (ref >60)
Glucose, Bld: 107 mg/dL — ABNORMAL HIGH (ref 70–99)
Potassium: 3.5 mmol/L (ref 3.5–5.1)
Sodium: 135 mmol/L (ref 135–145)

## 2022-07-17 LAB — ECHOCARDIOGRAM COMPLETE: Height: 66 in

## 2022-07-17 MED ORDER — BISACODYL 5 MG PO TBEC
5.0000 mg | DELAYED_RELEASE_TABLET | Freq: Every day | ORAL | Status: DC | PRN
  Administered 2022-07-19: 5 mg via ORAL
  Filled 2022-07-17: qty 1

## 2022-07-17 MED ORDER — POLYETHYLENE GLYCOL 3350 17 G PO PACK
17.0000 g | PACK | Freq: Every day | ORAL | Status: DC
  Administered 2022-07-17 – 2022-07-19 (×3): 17 g via ORAL
  Filled 2022-07-17 (×3): qty 1

## 2022-07-17 MED ORDER — BISACODYL 10 MG RE SUPP
10.0000 mg | Freq: Once | RECTAL | Status: DC
  Filled 2022-07-17: qty 1

## 2022-07-17 MED ORDER — POTASSIUM CHLORIDE CRYS ER 20 MEQ PO TBCR
40.0000 meq | EXTENDED_RELEASE_TABLET | Freq: Once | ORAL | Status: AC
  Administered 2022-07-17: 40 meq via ORAL
  Filled 2022-07-17: qty 2

## 2022-07-17 MED ORDER — SENNOSIDES-DOCUSATE SODIUM 8.6-50 MG PO TABS
1.0000 | ORAL_TABLET | Freq: Two times a day (BID) | ORAL | Status: DC
  Administered 2022-07-17 – 2022-07-21 (×9): 1 via ORAL
  Filled 2022-07-17 (×9): qty 1

## 2022-07-17 NOTE — Progress Notes (Signed)
Occupational Therapy Treatment Patient Details Name: Marcus Coleman MRN: [id number] DOB: Mar 20, 1940 Today's Date: 07/17/2022   History of present illness DANIELJOSEPH Coleman is a 82 y.o. male with medical history significant of hypertension, hyperlipidemia, CAD, GERD, GI bleeding, BPH, anemia, atrial fibrillation, DVT not on anticoagulants, chronic back pain, who presents with fall, back pain.   OT comments  [name] was seen for OT treatment on this date. Upon arrival to room pt reclined in bed, agreeable to tx. Pt requires MIN A rolling bed level. SETUP + SBA don/doff gown seated EOB, fair tolerance sitting ~10 min at EOB. MIN A x2 + RW sit<>stand and side steps along EOB. Pt making good progress toward goals, will continue to follow POC. Discharge recommendation remains appropriate.     Recommendations for follow up therapy are one component of a multi-disciplinary discharge planning process, led by the attending physician.  Recommendations may be updated based on patient status, additional functional criteria and insurance authorization.    Assistance Recommended at Discharge Frequent or constant Supervision/Assistance  Patient can return home with the following  A lot of help with bathing/dressing/bathroom;Assistance with cooking/housework;Direct supervision/assist for medications management;Direct supervision/assist for financial management;Assist for transportation;Help with stairs or ramp for entrance;A lot of help with walking and/or transfers   Equipment Recommendations  Other (comment) (defer)    Recommendations for Other Services      Precautions / Restrictions Precautions Precautions: Fall Restrictions Weight Bearing Restrictions: No       Mobility Bed Mobility Overal bed mobility: Needs Assistance Bed Mobility: Sit to Supine, Rolling, Sidelying to Sit Rolling: Min assist Sidelying to sit: Min assist   Sit to supine: Mod assist        Transfers Overall transfer  level: Needs assistance Equipment used: Rolling walker (2 wheels) Transfers: Sit to/from Stand Sit to Stand: Min assist, +2 physical assistance                 Balance Overall balance assessment: Needs assistance Sitting-balance support: No upper extremity supported, Feet supported Sitting balance-Leahy Scale: Fair     Standing balance support: Bilateral upper extremity supported Standing balance-Leahy Scale: Fair                             ADL either performed or assessed with clinical judgement   ADL Overall ADL's : Needs assistance/impaired                                       General ADL Comments: SETUP + SBA don/doff gown seated EOB. MIN A x2 + RW simulated BSC t/f      Cognition Arousal/Alertness: Awake/alert Behavior During Therapy: WFL for tasks assessed/performed Overall Cognitive Status: Within Functional Limits for tasks assessed                                 General Comments: pleasant and cooperative        Exercises Other Exercises Other Exercises: educated patient on long roll technique            Pertinent Vitals/ Pain       Pain Assessment Pain Assessment: 0-10 Pain Score: 4  Pain Location: low back a approx L5; with seated position, when moving legs, Pain Descriptors / Indicators: Discomfort, Grimacing, Guarding, Moaning Pain  Intervention(s): Limited activity within patient's tolerance, Repositioned, Premedicated before session   Frequency  Min 3X/week        Progress Toward Goals  OT Goals(current goals can now be found in the care plan section)  Progress towards OT goals: Progressing toward goals  Acute Rehab OT Goals Patient Stated Goal: to swim OT Goal Formulation: With patient Time For Goal Achievement: 07/30/22 Potential to Achieve Goals: Good ADL Goals Pt Will Perform Grooming: with set-up;standing Pt Will Perform Upper Body Dressing: with set-up;sitting Pt Will Perform  Lower Body Dressing: with set-up;sit to/from stand Pt Will Transfer to Toilet: with modified independence;ambulating;regular height toilet Pt Will Perform Toileting - Clothing Manipulation and hygiene: with modified independence;sit to/from stand  Plan Discharge plan remains appropriate;Frequency remains appropriate    Co-evaluation                 AM-PAC OT "6 Clicks" Daily Activity     Outcome Measure   Help from another person eating meals?: None Help from another person taking care of personal grooming?: A Little Help from another person toileting, which includes using toliet, bedpan, or urinal?: A Lot Help from another person bathing (including washing, rinsing, drying)?: A Lot Help from another person to put on and taking off regular upper body clothing?: A Little Help from another person to put on and taking off regular lower body clothing?: A Lot 6 Click Score: 16    End of Session Equipment Utilized During Treatment: Rolling walker (2 wheels)  OT Visit Diagnosis: Pain Pain - Right/Left: Right Pain - part of body:  (back)   Activity Tolerance Patient tolerated treatment well   Patient Left in bed;with call bell/phone within reach;with bed alarm set   Nurse Communication Mobility status        Time: 5621-3086 OT Time Calculation (min): 19 min  Charges: OT General Charges $OT Visit: 1 Visit OT Treatments $Self Care/Home Management : 8-22 mins  Kathie Dike, M.S. OTR/L  07/17/22, 12:38 PM  ascom [phone]

## 2022-07-17 NOTE — Progress Notes (Addendum)
Progress Note   Patient: Marcus Coleman ZOX:[id number] DOB: October 26, 1940 DOA: 07/15/2022     1 DOS: the patient was seen and examined on 07/17/2022   Brief hospital course: HPI on admission 07/15/22, per Dr. Clyde Lundborg: "Marcus Coleman is a 82 y.o. male with medical history significant of hypertension, hyperlipidemia, CAD, GERD, GI bleeding, BPH, anemia, atrial fibrillation, DVT not on anticoagulants, chronic back pain, who presents with fall, back pain.   Pt states that he had appx one pint of wine with dinner around 1900 yesterday. Patient thinks he may have fallen asleep in his computer chair and struck his back after fall.  He has chronic back pain, which has worsened significantly.  The pain is located in middle and lower back, constant, sharp, severe, nonradiating.  It is aggravated by movement. No leg numbness or weakness.  No incontinence.  Denies head or neck injury.  Patient denies shortness of breath, but was found to have oxygen desaturation to 82% on room air, which increased to 99% on 2 to 3 L oxygen.  Patient initially had 95% of oxygen saturation in room air. His oxygen desaturation happened after receiving morphine in ED.  ...  Patient has bilateral lower leg edema..."  Patient was admitted to the hospital for further evaluation and management.    CT of Lumbar spine showed a chronic T12 compression fracture, S-shaped scoliosis and multi-level DDD and spinal stenosis  He was started on IV Lasix and Echo ordered given signs of volume overload.  Pt later reported missing doses of his home diuretic.  Seen by PT and OT who are recommending SNF for rehab at discharge.   Assessment and Plan: * Fall Acute on Chronic Back Pain CT-head and neck negative for acute injury. CT-L spine showed no acute traumatic injury, but showed chronic T12 compression fracture degenerative disc disease and multilevel spinal stenosis.  --Pain control: scheduled Tylenol, PRN oxycodone 5-10 mg, IV morphine for  breakthrough. --Caution using IV morphine -- pt had transient O2 desat in ED after morphine - monitor closely if given, supplement O2 if sats < 90% --As needed Robaxin --PT/OT evaluations --Anticipate need for rehab at d/c --Fall precautions  Back pain Acute on chronic Lumbar degenerative disc disease Lumbar spinal stenosis --Pain control as outlined - scheduled Tylenol, PRN oxycodone 5-10 mg, Lidocaine patch, IV morphine for breakthrough pain --Bowel regimen --PT/OT - SNF/rehab recommended  Oxygen desaturation Occurred in ED after given IV morphine.  CTA negative for PE.   BLE doppler U/S negative for DVT.   Patient has bilateral 2+ leg edema, elevated BNP 386, indicating possible undiagnosed diastolic CHF.  Echo on 02/11/2022 showed EF of 50-55%. --Started IV Lasix 40 mg twice daily - continue --Follow pending Echo --Strict I/O's, daily weights --Supplement O2, goal spO2 90-94%, wean as tolerated  CAD (coronary artery disease) Stable. No chest pain. --Not on ASA due to hx of GI bleeds  Iron deficiency anemia Hbg stable. Monitor CBC.  Benign prostatic hyperplasia with lower urinary tract symptoms --Flomax  History of DVT (deep vein thrombosis) Not on anticoagulants.   No DVT or PE on dopplers and CTA chest this admission. --Lovenox for DVT prophylaxis  Persistent atrial fibrillation (HCC) Rate controlled --Continue Cardizem --Not on anticoagulation due to hx of GI bleeding, falls risk  HTN (hypertension) --IV hydralazine as needed --Continue Cardizem  Alcohol abuse No withdrawal signs. D/C CIWA and PRN ativan  Counseled on moderation / cessation        Subjective: Pt awake  resting in bed this AM.  He reports ongoing episodes back pain get excruciating.  Does not tolerate attempts to sit up yet.  Has not had a BM since admission.  They have not had a heat pack available for him yet.  Otherwise no acute complaints.  Agreeable to rehab.   Physical  Exam: Vitals:   07/16/22 0757 07/16/22 1647 07/16/22 2111 07/17/22 0830  BP: 112/66 104/65 106/78 108/84  Pulse: 92 82 86 81  Resp: 16 16 17 18   Temp: 97.9 F (36.6 C) 97.8 F (36.6 C) 98.2 F (36.8 C) (!) 97.5 F (36.4 C)  TempSrc:      SpO2: 92% 96% 93% 94%  Height:       General exam: awake, alert, no acute distress HEENT: moist mucus membranes, hearing grossly normal  Respiratory system: CTAB, no wheezes, rales or rhonchi, normal respiratory effort. Cardiovascular system: normal S1/S2, RRR, improved BLE edema 1-2+ Gastrointestinal system: soft, NT, ND Central nervous system: A&O x3. no gross focal neurologic deficits, normal speech Extremities: moves all, no edema, normal tone Skin: dry, intact, normal temperature, normal color, No rashes, lesions or ulcers Psychiatry: normal mood, congruent affect, judgement and insight appear normal   Data Reviewed:  Notable labs --  BUN 24, Ca 8.6 otherwise normal BMP.  CBC with Hgb stable 11.1  Family Communication: Updated wife by phone this afternoon (5/6)  Disposition: Status is: Observation The patient remains OBS appropriate and will d/c before 2 midnights. Remains admitted for further IV diuresis. Requires SNF/rehab placement.   Planned Discharge Destination: Skilled nursing facility     Time spent: 38 minutes  Author: Pennie Banter, DO 07/17/2022 12:59 PM  For on call review [email or web address].

## 2022-07-17 NOTE — Progress Notes (Signed)
*  PRELIMINARY RESULTS* Echocardiogram 2D Echocardiogram has been performed.  Cristela Blue 07/17/2022, 10:49 AM

## 2022-07-17 NOTE — Progress Notes (Addendum)
Physical Therapy Treatment Patient Details Name: Marcus Coleman MRN: [id number] DOB: 10/09/1940 Today's Date: 07/17/2022   History of Present Illness Marcus Coleman is a 81yoM who comes to [facility] ED on 5/3 after fall from office chair to floor at home and subsequent acute on chronic low back pain, acute neck pain. 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 DVT/PE studies also negative. PMH: HTN, HLD CAD, GERD, GIB, BPH, AF and DVT not on anticoagulants, chronic back pain.    PT Comments     Pt in bed on entry, agreeable to session. Pt had pain meds ~90 minutes prior to arrival, author encouraged attempt to transfer and AMB while pain meds are at peak efficacy. Heavy physical assist to EOB, however pt will sleep in recliner upon return to home. Pt able to rise to standing with very elevated surface height, but from chair is much more pain limited, really takes some convincing to try multiple times. Once up, pt able to tolerated slow AMB with RW fairly well. Pt left up in chair, encouraged at least 45 minutes for pulmonary hygiene. Linen changed while in room, fresh gown provided. RN made aware of additional needs. Guests in room at entry/exit.    Recommendations for follow up therapy are one component of a multi-disciplinary discharge planning process, led by the attending physician.  Recommendations may be updated based on patient status, additional functional criteria and insurance authorization.  Follow Up Recommendations  Can patient physically be transported by private vehicle: No    Assistance Recommended at Discharge Intermittent Supervision/Assistance  Patient can return home with the following Two people to help with walking and/or transfers;Assistance with cooking/housework;Direct supervision/assist for medications management;Assist for transportation;Help with stairs or ramp for entrance;Two people to help with  bathing/dressing/bathroom   Equipment Recommendations  Rolling walker (2 wheels);BSC/3in1    Recommendations for Other Services       Precautions / Restrictions Precautions Precautions: Fall Restrictions Weight Bearing Restrictions: No     Mobility  Bed Mobility Overal bed mobility: Needs Assistance Bed Mobility: Supine to Sit     Supine to sit: Mod assist     General bed mobility comments: support of trunk, minA of legs, maxA of Rt hip forward shift at EOB    Transfers Overall transfer level: Needs assistance Equipment used: Rolling walker (2 wheels) Transfers: Sit to/from Stand, Bed to chair/wheelchair/BSC Sit to Stand: Mod assist, +2 physical assistance   Step pivot transfers: Supervision       General transfer comment: heavy mod from recliner; minGuard from very elevated EOB.    Ambulation/Gait   Gait Distance (Feet): 8 Feet Assistive device: Rolling walker (2 wheels) Gait Pattern/deviations: Step-to pattern       General Gait Details: agreeable to attempt at end of session, has more pain and limitation geting to standing from recliner than actually walking   Stairs             Wheelchair Mobility    Modified Rankin (Stroke Patients Only)       Balance                                            Cognition Arousal/Alertness: Awake/alert Behavior During Therapy: WFL for tasks assessed/performed Overall Cognitive Status: Within Functional Limits for tasks assessed  Exercises      General Comments        Pertinent Vitals/Pain Pain Assessment Pain Assessment: 0-10 Pain Score: 3  Pain Location: aching in low back while seated, worse when transitioning to stand up Pain Intervention(s): Limited activity within patient's tolerance, Patient requesting pain meds-RN notified, Repositioned, Premedicated before session, Monitored during session    Home Living                           Prior Function            PT Goals (current goals can now be found in the care plan section) Acute Rehab PT Goals Patient Stated Goal: get better PT Goal Formulation: With patient Time For Goal Achievement: 07/30/22 Potential to Achieve Goals: Good Progress towards PT goals: Not progressing toward goals - comment    Frequency    Min 3X/week      PT Plan Current plan remains appropriate;Frequency needs to be updated    Co-evaluation              AM-PAC PT "6 Clicks" Mobility   Outcome Measure  Help needed turning from your back to your side while in a flat bed without using bedrails?: Total Help needed moving from lying on your back to sitting on the side of a flat bed without using bedrails?: Total Help needed moving to and from a bed to a chair (including a wheelchair)?: A Lot Help needed standing up from a chair using your arms (e.g., wheelchair or bedside chair)?: A Lot Help needed to walk in hospital room?: A Little Help needed climbing 3-5 steps with a railing? : Total 6 Click Score: 10    End of Session   Activity Tolerance: Patient limited by pain;Patient tolerated treatment well Patient left: with call bell/phone within reach;in chair;with chair alarm set Nurse Communication: Mobility status (should limited OOB to chair x45 minutes to optimize analgesia; bed soaked with pee despite purewick; will need +2 to stand from chair.) PT Visit Diagnosis: Pain;Difficulty in walking, not elsewhere classified (R26.2) Pain - part of body:  (low back pan)     Time: 1335-1416 PT Time Calculation (min) (ACUTE ONLY): 41 min  Charges:  $Gait Training: 8-22 mins $Therapeutic Activity: 23-37 mins                    2:40 PM, 07/17/22 Rosamaria Lints, PT, DPT Physical Therapist - [facility]  [phone] (ASCOM)    Avian Konigsberg C 07/17/2022, 2:40 PM

## 2022-07-17 NOTE — Plan of Care (Signed)
  Problem: Education: Goal: Knowledge of General Education information will improve Description: Including pain rating scale, medication(s)/side effects and non-pharmacologic comfort measures Outcome: Progressing   Problem: Health Behavior/Discharge Planning: Goal: Ability to manage health-related needs will improve Outcome: Progressing   Problem: Clinical Measurements: Goal: Diagnostic test results will improve Outcome: Progressing Goal: Cardiovascular complication will be avoided Outcome: Progressing   Problem: Nutrition: Goal: Adequate nutrition will be maintained Outcome: Progressing   Problem: Elimination: Goal: Will not experience complications related to bowel motility Outcome: Progressing Goal: Will not experience complications related to urinary retention Outcome: Progressing   Problem: Pain Managment: Goal: General experience of comfort will improve Outcome: Progressing   

## 2022-07-18 DIAGNOSIS — M5441 Lumbago with sciatica, right side: Secondary | ICD-10-CM | POA: Diagnosis not present

## 2022-07-18 DIAGNOSIS — M5442 Lumbago with sciatica, left side: Secondary | ICD-10-CM | POA: Diagnosis not present

## 2022-07-18 DIAGNOSIS — R0902 Hypoxemia: Secondary | ICD-10-CM | POA: Diagnosis not present

## 2022-07-18 DIAGNOSIS — I4819 Other persistent atrial fibrillation: Secondary | ICD-10-CM | POA: Diagnosis not present

## 2022-07-18 LAB — ECHOCARDIOGRAM COMPLETE
AR max vel: 2.24 cm2
AV Area VTI: 2.47 cm2
AV Area mean vel: 2.43 cm2
AV Mean grad: 3 mmHg
AV Peak grad: 5.4 mmHg
Ao pk vel: 1.16 m/s
Area-P 1/2: 5.02 cm2
MV VTI: 1.87 cm2
S' Lateral: 3.5 cm

## 2022-07-18 LAB — BASIC METABOLIC PANEL
Anion gap: 7 (ref 5–15)
BUN: 19 mg/dL (ref 8–23)
CO2: 34 mmol/L — ABNORMAL HIGH (ref 22–32)
Calcium: 8.8 mg/dL — ABNORMAL LOW (ref 8.9–10.3)
Chloride: 94 mmol/L — ABNORMAL LOW (ref 98–111)
Creatinine, Ser: 0.75 mg/dL (ref 0.61–1.24)
GFR, Estimated: >60 mL/min (ref >60)
Glucose, Bld: 105 mg/dL — ABNORMAL HIGH (ref 70–99)
Potassium: 3.8 mmol/L (ref 3.5–5.1)
Sodium: 135 mmol/L (ref 135–145)

## 2022-07-18 MED ORDER — METHOCARBAMOL 500 MG PO TABS
500.0000 mg | ORAL_TABLET | Freq: Three times a day (TID) | ORAL | Status: DC
  Administered 2022-07-18 – 2022-07-21 (×9): 500 mg via ORAL
  Filled 2022-07-18 (×9): qty 1

## 2022-07-18 MED ORDER — KETOROLAC TROMETHAMINE 15 MG/ML IJ SOLN: 15.0000 mg | Freq: Four times a day (QID) | INTRAMUSCULAR | Status: DC | PRN

## 2022-07-18 NOTE — Plan of Care (Signed)

## 2022-07-18 NOTE — TOC Initial Note (Signed)
Transition of Care [facility]) - Initial/Assessment Note    Patient Details  Name: Marcus Coleman MRN: [id number] Date of Birth: 1940-12-16  Transition of Care [facility]) CM/SW Contact:    Garret Reddish, RN Phone Number: 07/18/2022, 3:59 PM  Clinical Narrative:  Chart reviewed.  I have meet with patient at bedside today.  Marcus Coleman informs me that prior to admission he lived at home with his wife Marcus Coleman.  He reports that he was able to bath and dress himself prior to admission.  He reports that he was able to drive.  Patient reports that he was able to do most things for himself prior to admission but he is not able to do these things now due to his fall.  Patient reported that he used [facility] on [address] for prescription medications.  He reported that medications are affordable.  Patient states that his PCP is [facility].   Patient reports that he does have a VA benefit but he is not service connected. He would like to use his HTA plan for Short term rehab.  Patient reports that he will need Short term rehab prior to returning home.    I have explained the SNF placement process to the patient. He has given me permission to complete a bed search to [place] area facilities.    I have spoken to patient's wife and explained her the SNF process also.  Marcus Coleman prefers [facility].    I have completed Fl 2, PASSR and Bed search  to [place] area facilities.    TOC will continue to follow for discharge planning.                   Expected Discharge Plan: Skilled Nursing Facility Barriers to Discharge: No Barriers Identified   Patient Goals and CMS Choice            Expected Discharge Plan and Services   Discharge Planning Services: CM Consult Post Acute Care Choice: Skilled Nursing Facility Living arrangements for the past 2 months: Single Family Home                                      Prior Living Arrangements/Services Living arrangements  for the past 2 months: Single Family Home Lives with:: Spouse Patient language and need for interpreter reviewed:: Yes Do you feel safe going back to the place where you live?: Yes      Need for Family Participation in Patient Care: Yes (Comment) Care giver support system in place?: Yes (comment) (Patient has a supportive wife)      Activities of Daily Living Home Assistive Devices/Equipment: Cane (specify quad or straight) ADL Screening (condition at time of admission) Patient's cognitive ability adequate to safely complete daily activities?: Yes Is the patient deaf or have difficulty hearing?: No Does the patient have difficulty seeing, even when wearing glasses/contacts?: No Does the patient have difficulty concentrating, remembering, or making decisions?: No Patient able to express need for assistance with ADLs?: No Does the patient have difficulty dressing or bathing?: No Independently performs ADLs?: Yes (appropriate for developmental age) Does the patient have difficulty walking or climbing stairs?: No Weakness of Legs: None Weakness of Arms/Hands: None  Permission Sought/Granted   Permission granted to share information with : Yes, Verbal Permission Granted              Emotional  Assessment Appearance:: Appears stated age Attitude/Demeanor/Rapport: Engaged Affect (typically observed): Appropriate Orientation: : Oriented to Self, Oriented to Place, Oriented to  Time, Oriented to Situation Alcohol / Substance Use: Alcohol Use    Admission diagnosis:  Fall [W19.XXXA] Hypoxia [R09.02] Intractable pain [R52] Fall, initial encounter [W19.XXXA] Acute midline low back pain without sciatica [M54.50] Low back pain [M54.50] Patient Active Problem List   Diagnosis Date Noted   Fall 07/15/2022   HTN (hypertension) 07/15/2022   CAD (coronary artery disease) 07/15/2022   Oxygen desaturation 07/15/2022   Back pain 07/15/2022   Alcohol abuse 07/15/2022   Male erectile  disorder 02/27/2022   Exposure to Agent [facility] 02/27/2022   Other persistent atrial fibrillation (HCC) 02/27/2022   Decreased responsiveness 02/11/2022   Elevated lactic acid level 02/11/2022   Severe sepsis (HCC) 02/10/2022   Status post reverse total shoulder replacement, 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   Nontraumatic complete tear of left rotator cuff 12/26/2021   Rotator cuff arthropathy, left 12/26/2021   Rotator cuff tendinitis, left 12/26/2021   Persistent atrial fibrillation (HCC) 06/01/2021   Cataract 11/23/2020   Low back pain 11/23/2020   Other personal history presenting hazards to health 11/23/2020   Psychosexual dysfunction with inhibited sexual excitement 11/23/2020   History of GI bleed 06/09/2019   Acute deep vein thrombosis (DVT) of popliteal vein (HCC) 06/09/2019   History of DVT (deep vein thrombosis) 06/09/2019   B12 deficiency anemia 04/21/2019   Pain in limb 03/28/2019   Nonrheumatic mitral valve regurgitation 12/10/2018   Benign prostatic hyperplasia with lower urinary tract symptoms 11/06/2018   Hiatal hernia 09/28/2017   Hyperlipidemia 09/28/2017   Vasovagal syncope 09/28/2017   DDD (degenerative disc disease), thoracolumbar 11/13/2014   Lumbar radiculitis 11/13/2014   Iron deficiency anemia 10/06/2013   Anemia 10/06/2013   S/P ERCP 01/23/2013   PCP:  [name], MD Pharmacy:   [facility] [address] [place] - [address] [place] [address] Phone: [phone] Fax: [phone]     Social Determinants of Health (SDOH) Social History: SDOH Screenings   Food Insecurity: No Food Insecurity (07/15/2022)  Housing: Low Risk  (07/15/2022)  Transportation Needs: No Transportation Needs (07/15/2022)  Utilities: Not At Risk (07/15/2022)  Tobacco Use: Medium Risk (07/15/2022)   SDOH Interventions:     Readmission Risk Interventions     No data to display

## 2022-07-18 NOTE — Progress Notes (Addendum)
Progress Note   Patient: Marcus Coleman OZH:[id number] DOB: Mar 05, 1941 DOA: 07/15/2022     2 DOS: the patient was seen and examined on 07/18/2022   Brief hospital course: HPI on admission 07/15/22, per Dr. Clyde Lundborg: "Marcus Coleman is a 82 y.o. male with medical history significant of hypertension, hyperlipidemia, CAD, GERD, GI bleeding, BPH, anemia, atrial fibrillation, DVT not on anticoagulants, chronic back pain, who presents with fall, back pain.   Pt states that he had appx one pint of wine with dinner around 1900 yesterday. Patient thinks he may have fallen asleep in his computer chair and struck his back after fall.  He has chronic back pain, which has worsened significantly.  The pain is located in middle and lower back, constant, sharp, severe, nonradiating.  It is aggravated by movement. No leg numbness or weakness.  No incontinence.  Denies head or neck injury.  Patient denies shortness of breath, but was found to have oxygen desaturation to 82% on room air, which increased to 99% on 2 to 3 L oxygen.  Patient initially had 95% of oxygen saturation in room air. His oxygen desaturation happened after receiving morphine in ED.  ...  Patient has bilateral lower leg edema..."  Patient was admitted to the hospital for further evaluation and management.    CT of Lumbar spine showed a chronic T12 compression fracture, S-shaped scoliosis and multi-level DDD and spinal stenosis  He was started on IV Lasix and Echo ordered given signs of volume overload.  Pt later reported missing doses of his home diuretic.  Seen by PT and OT who are recommending SNF for rehab at discharge.   Assessment and Plan: * Fall Acute on Chronic Back Pain CT-head and neck negative for acute injury. CT-L spine showed no acute traumatic injury, but showed chronic T12 compression fracture degenerative disc disease and multilevel spinal stenosis.  --Pain control: scheduled Tylenol, PRN oxycodone 5-10 mg, IV morphine for  breakthrough. --Caution using IV morphine -- pt had transient O2 desat in ED after morphine - monitor closely if given, supplement O2 if sats < 90% --Make Robaxin PRN >> scheudled --PT/OT evaluations --Anticipate need for rehab at d/c --Fall precautions --Requested Neurosurgery's input, formally consult if indicated  Back pain Acute on chronic Lumbar degenerative disc disease Lumbar spinal stenosis --Pain control as outlined - scheduled Tylenol, PRN oxycodone 5-10 mg, Lidocaine patch, IV morphine for breakthrough pain --Bowel regimen --PT/OT - SNF/rehab recommended  Oxygen desaturation Occurred in ED after given IV morphine.  CTA negative for PE.   BLE doppler U/S negative for DVT.   Patient has bilateral 2+ leg edema, elevated BNP 386, indicating possible undiagnosed diastolic CHF.  Echo on 02/11/2022 showed EF of 50-55%. Echo this admission EF 55-60% --Continue IV Lasix 40 mg BID --Strict I/O's, daily weights --Supplement O2, goal spO2 90-94%, wean as tolerated  CAD (coronary artery disease) Stable. No chest pain. --Not on ASA due to hx of GI bleeds  Iron deficiency anemia Hbg stable. Monitor CBC.  Benign prostatic hyperplasia with lower urinary tract symptoms --Flomax  History of DVT (deep vein thrombosis) Not on anticoagulants.   No DVT or PE on dopplers and CTA chest this admission. --Lovenox for DVT prophylaxis  Persistent atrial fibrillation (HCC) Rate controlled --Continue Cardizem --Not on anticoagulation due to hx of GI bleeding, falls risk  HTN (hypertension) --IV hydralazine as needed --Continue Cardizem  Alcohol abuse No withdrawal signs. D/C CIWA and PRN ativan  Counseled on moderation / cessation  Subjective: Pt awake resting in bed this AM.  Therapy had just been in, but he's having too much pain to attempt working with them right now.  Leg swelling feels improved from admission but remains more than usual.  No other acute complaints.   No BM and he worries how he'll be able to go if he can't tolerate getting up.   Physical Exam: Vitals:   07/17/22 2046 07/17/22 2342 07/18/22 0500 07/18/22 0918  BP: 112/76 116/69  100/65  Pulse: 81 86  76  Resp:  18  16  Temp:  97.9 F (36.6 C)  97.6 F (36.4 C)  TempSrc:  Oral    SpO2:  94%  94%  Weight:   91 kg   Height:       General exam: awake, alert, no acute distress HEENT: moist mucus membranes, hearing grossly normal  Respiratory system: CTAB, no wheezes, rales or rhonchi, normal respiratory effort. Cardiovascular system: normal S1/S2, RRR, persistent BLE edema 2+ Gastrointestinal system: soft, NT, ND Central nervous system: A&O x3. no gross focal neurologic deficits, normal speech Extremities: moves all, no edema, normal tone Skin: dry, intact, normal temperature, normal color, No rashes, lesions or ulcers Psychiatry: normal mood, congruent affect, judgement and insight appear normal   Data Reviewed:  Notable labs --  Cl 94, bicarb 24 (contraction alkalosis), glucose 105, Ca 8.8.  Echocardiogram 5/6 -- EF 55-60%, indeterminate diastolic function, mild-moderate MR  Family Communication: Updated wife by phone 5/6 afternoon  Disposition: Status is: Observation The patient remains OBS appropriate and will d/c before 2 midnights. Remains admitted for further IV diuresis. Requires SNF/rehab placement.   Planned Discharge Destination: Skilled nursing facility     Time spent: 38 minutes  Author: Pennie Banter, DO 07/18/2022 1:58 PM  For on call review [email or web address].

## 2022-07-18 NOTE — [place] FL2 (Signed)
[place] MEDICAID FL2 LEVEL OF CARE FORM     IDENTIFICATION  Patient Name: Marcus Coleman Birthdate: [birth year] Sex: male Admission Date (Current Location): 07/15/2022  [facility] and [place] Number:  Chiropodist and Address:  [facility], [address] [place], [place] [address]      Provider Number: [id number]  Attending Physician Name and Address:  Pennie Banter, DO  Relative Name and Phone Number:  Aneesh Whittenberger [phone]    Current Level of Care: Hospital Recommended Level of Care: Skilled Nursing Facility Prior Approval Number:    Date Approved/Denied:   PASRR Number: [id number]  Discharge Plan: SNF    Current Diagnoses: Patient Active Problem List   Diagnosis Date Noted   Fall 07/15/2022   HTN (hypertension) 07/15/2022   CAD (coronary artery disease) 07/15/2022   Oxygen desaturation 07/15/2022   Back pain 07/15/2022   Alcohol abuse 07/15/2022   Male erectile disorder 02/27/2022   Exposure to Agent [facility] 02/27/2022   Other persistent atrial fibrillation (HCC) 02/27/2022   Decreased responsiveness 02/11/2022   Elevated lactic acid level 02/11/2022   Severe sepsis (HCC) 02/10/2022   Status post reverse total shoulder replacement, 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   Nontraumatic complete tear of left rotator cuff 12/26/2021   Rotator cuff arthropathy, left 12/26/2021   Rotator cuff tendinitis, left 12/26/2021   Persistent atrial fibrillation (HCC) 06/01/2021   Cataract 11/23/2020   Low back pain 11/23/2020   Other personal history presenting hazards to health 11/23/2020   Psychosexual dysfunction with inhibited sexual excitement 11/23/2020   History of GI bleed 06/09/2019   Acute deep vein thrombosis (DVT) of popliteal vein (HCC) 06/09/2019   History of DVT (deep vein thrombosis) 06/09/2019   B12 deficiency anemia 04/21/2019   Pain in limb 03/28/2019   Nonrheumatic mitral valve regurgitation 12/10/2018   Benign prostatic  hyperplasia with lower urinary tract symptoms 11/06/2018   Hiatal hernia 09/28/2017   Hyperlipidemia 09/28/2017   Vasovagal syncope 09/28/2017   DDD (degenerative disc disease), thoracolumbar 11/13/2014   Lumbar radiculitis 11/13/2014   Iron deficiency anemia 10/06/2013   Anemia 10/06/2013   S/P ERCP 01/23/2013    Orientation RESPIRATION BLADDER Height & Weight     Self, Time, Situation, Place  Normal Continent Weight: 91 kg Height:  5\' 6"  (167.6 cm)  BEHAVIORAL SYMPTOMS/MOOD NEUROLOGICAL BOWEL NUTRITION STATUS      Continent  (See Discharge Summary)  AMBULATORY STATUS COMMUNICATION OF NEEDS Skin   Extensive Assist Verbally Normal                       Personal Care Assistance Level of Assistance  Bathing, Feeding, Dressing Bathing Assistance: Maximum assistance Feeding assistance: Limited assistance Dressing Assistance: Maximum assistance     Functional Limitations Info  Sight, Hearing, Speech Sight Info: Adequate Hearing Info: Adequate Speech Info: Adequate    SPECIAL CARE FACTORS FREQUENCY  PT (By licensed PT), OT (By licensed OT)     PT Frequency: 5x Weekly OT Frequency: 5x Weekly            Contractures Contractures Info: Not present    Additional Factors Info  Code Status, Allergies Code Status Info: Full Code Allergies Info: Nsaids           Current Medications (07/18/2022):  This is the current hospital active medication list Current Facility-Administered Medications  Medication Dose Route Frequency Provider Last Rate Last Admin   acetaminophen (TYLENOL) tablet 1,000 mg  1,000 mg Oral Q8H  Esaw Grandchild A, DO   1,000 mg at 07/18/22 1424   albuterol (PROVENTIL) (2.5 MG/3ML) 0.083% nebulizer solution 2.5 mg  2.5 mg Inhalation Q4H PRN Lorretta Harp, MD       bisacodyl (DULCOLAX) EC tablet 5 mg  5 mg Oral Daily PRN Esaw Grandchild A, DO       dextromethorphan-guaiFENesin (MUCINEX DM) 30-600 MG per 12 hr tablet 1 tablet  1 tablet Oral BID PRN Lorretta Harp, MD       diltiazem (CARDIZEM) tablet 30 mg  30 mg Oral Q12H Lorretta Harp, MD   30 mg at 07/18/22 1056   enoxaparin (LOVENOX) injection 40 mg  40 mg Subcutaneous Q24H Lorretta Harp, MD   40 mg at 07/18/22 1058   fluticasone (FLONASE) 50 MCG/ACT nasal spray 1 spray  1 spray Each Nare Daily PRN Lorretta Harp, MD       folic acid (FOLVITE) tablet 1 mg  1 mg Oral Daily Lorretta Harp, MD   1 mg at 07/18/22 1056   furosemide (LASIX) injection 40 mg  40 mg Intravenous BID Lorretta Harp, MD   40 mg at 07/18/22 4098   hydrALAZINE (APRESOLINE) injection 5 mg  5 mg Intravenous Q2H PRN Lorretta Harp, MD       ketorolac (TORADOL) 15 MG/ML injection 15 mg  15 mg Intravenous Q6H PRN Esaw Grandchild A, DO       lidocaine (LIDODERM) 5 % 1 patch  1 patch Transdermal Q24H Lorretta Harp, MD   1 patch at 07/18/22 1057   loratadine (CLARITIN) tablet 10 mg  10 mg Oral Daily Lorretta Harp, MD   10 mg at 07/18/22 1056   methocarbamol (ROBAXIN) tablet 500 mg  500 mg Oral TID Esaw Grandchild A, DO       morphine (PF) 2 MG/ML injection 2 mg  2 mg Intravenous Q4H PRN Esaw Grandchild A, DO       multivitamin with minerals tablet 1 tablet  1 tablet Oral Daily Lorretta Harp, MD   1 tablet at 07/18/22 1056   nystatin cream (MYCOSTATIN)   Topical BID Lorretta Harp, MD   Given at 07/18/22 1058   ondansetron (ZOFRAN) injection 4 mg  4 mg Intravenous Q8H PRN Lorretta Harp, MD       oxyCODONE (Oxy IR/ROXICODONE) immediate release tablet 5-10 mg  5-10 mg Oral Q4H PRN Esaw Grandchild A, DO   10 mg at 07/17/22 1737   pantoprazole (PROTONIX) EC tablet 40 mg  40 mg Oral Daily Lorretta Harp, MD   40 mg at 07/18/22 1056   polyethylene glycol (MIRALAX / GLYCOLAX) packet 17 g  17 g Oral Daily Esaw Grandchild A, DO   17 g at 07/18/22 1056   senna-docusate (Senokot-S) tablet 1 tablet  1 tablet Oral BID Esaw Grandchild A, DO   1 tablet at 07/18/22 1056   tamsulosin (FLOMAX) capsule 0.4 mg  0.4 mg Oral Daily Lorretta Harp, MD   0.4 mg at 07/18/22 1056   thiamine (VITAMIN B1)  tablet 100 mg  100 mg Oral Daily Lorretta Harp, MD   100 mg at 07/18/22 1056   Or   thiamine (VITAMIN B1) injection 100 mg  100 mg Intravenous Daily Lorretta Harp, MD       timolol (TIMOPTIC) 0.5 % ophthalmic solution 1 drop  1 drop Both Eyes Daily Lorretta Harp, MD   1 drop at 07/18/22 0600     Discharge Medications: Please see discharge summary for a list of discharge medications.  Relevant Imaging Results:  Relevant Lab Results:   Additional Information [id number]  Garret Reddish, RN

## 2022-07-18 NOTE — Progress Notes (Signed)
Physical Therapy Treatment Patient Details Name: Marcus Coleman MRN: [id number] DOB: Jun 03, 1940 Today's Date: 07/18/2022   History of Present Illness Marcus Coleman is a 81yoM who comes to [facility] ED on 5/3 after fall from office chair to floor at home and subsequent acute on chronic low back pain, acute neck pain. 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 DVT/PE studies also negative. PMH: HTN, HLD CAD, GERD, GIB, BPH, AF and DVT not on anticoagulants, chronic back pain.    PT Comments    Patient is agreeable to PT. He declined getting out of bed this session due to fatigue and fear of increased back pain with mobility. Min A required for rolling to the left with cues for technique to facilitate independence. Ice pack placed at low back and at the posterior neck per patient request for pain management. LE exercises performed with assistance for strengthening. Recommend to continue PT to maximize independence and decrease caregiver burden.    Recommendations for follow up therapy are one component of a multi-disciplinary discharge planning process, led by the attending physician.  Recommendations may be updated based on patient status, additional functional criteria and insurance authorization.  Follow Up Recommendations  Can patient physically be transported by private vehicle: No    Assistance Recommended at Discharge Intermittent Supervision/Assistance  Patient can return home with the following Two people to help with walking and/or transfers;Assistance with cooking/housework;Direct supervision/assist for medications management;Assist for transportation;Help with stairs or ramp for entrance;Two people to help with bathing/dressing/bathroom   Equipment Recommendations  Rolling walker (2 wheels);BSC/3in1    Recommendations for Other Services       Precautions / Restrictions Precautions Precautions: Fall Restrictions Weight Bearing  Restrictions: No     Mobility  Bed Mobility Overal bed mobility: Needs Assistance Bed Mobility: Rolling Rolling: Min assist         General bed mobility comments: patient declined sitting up on edge of bed today. he required cues for LE positioning to faciliate rolling to the left side for placement of ice pack for low back pain. increased time required with increased pain reported with mobility. heavy use of bed rails for support    Transfers                   General transfer comment: patient declined today    Ambulation/Gait                   Stairs             Wheelchair Mobility    Modified Rankin (Stroke Patients Only)       Balance                                            Cognition Arousal/Alertness: Awake/alert Behavior During Therapy: WFL for tasks assessed/performed Overall Cognitive Status: Within Functional Limits for tasks assessed                                          Exercises General Exercises - Lower Extremity Ankle Circles/Pumps: AROM, Strengthening, Both, 10 reps, Supine Short Arc Quad: AAROM, Strengthening, Both, 10 reps, Supine Heel Slides: AAROM, Strengthening, Both, 10 reps, Supine Hip ABduction/ADduction: AAROM, Strengthening, Both, 10 reps, Supine Other  Exercises Other Exercises: mild pain reported in the lower back with left hip abduction, otherwise no increased pain reported with exercises for strengthening. cues for technique    General Comments        Pertinent Vitals/Pain Pain Assessment Pain Assessment: Faces Faces Pain Scale: Hurts little more Pain Location: low back, neck Pain Descriptors / Indicators: Discomfort Pain Intervention(s): Limited activity within patient's tolerance, Monitored during session, Ice applied, Repositioned    Home Living                          Prior Function            PT Goals (current goals can now be found in the  care plan section) Acute Rehab PT Goals Patient Stated Goal: get better PT Goal Formulation: With patient Time For Goal Achievement: 07/30/22 Potential to Achieve Goals: Good Progress towards PT goals: Progressing toward goals    Frequency    Min 3X/week      PT Plan Current plan remains appropriate;Frequency needs to be updated    Co-evaluation              AM-PAC PT "6 Clicks" Mobility   Outcome Measure  Help needed turning from your back to your side while in a flat bed without using bedrails?: A Lot Help needed moving from lying on your back to sitting on the side of a flat bed without using bedrails?: Total Help needed moving to and from a bed to a chair (including a wheelchair)?: A Lot Help needed standing up from a chair using your arms (e.g., wheelchair or bedside chair)?: A Lot Help needed to walk in hospital room?: A Little Help needed climbing 3-5 steps with a railing? : Total 6 Click Score: 11    End of Session   Activity Tolerance: Patient limited by fatigue;Patient limited by pain Patient left: in bed;with call bell/phone within reach;with bed alarm set (ice pack applied to posterior neck and lower back per patient request for pain management) Nurse Communication: Mobility status PT Visit Diagnosis: Pain;Difficulty in walking, not elsewhere classified (R26.2)     Time: 1009-1030 PT Time Calculation (min) (ACUTE ONLY): 21 min  Charges:  $Therapeutic Exercise: 8-22 mins                    Marcus Coleman, PT, MPT    Marcus Coleman 07/18/2022, 11:38 AM

## 2022-07-19 ENCOUNTER — Inpatient Hospital Stay: Payer: No Typology Code available for payment source

## 2022-07-19 DIAGNOSIS — D508 Other iron deficiency anemias: Secondary | ICD-10-CM | POA: Diagnosis not present

## 2022-07-19 DIAGNOSIS — M546 Pain in thoracic spine: Secondary | ICD-10-CM | POA: Diagnosis not present

## 2022-07-19 DIAGNOSIS — Z86718 Personal history of other venous thrombosis and embolism: Secondary | ICD-10-CM

## 2022-07-19 DIAGNOSIS — I2510 Atherosclerotic heart disease of native coronary artery without angina pectoris: Secondary | ICD-10-CM | POA: Diagnosis not present

## 2022-07-19 DIAGNOSIS — R0902 Hypoxemia: Secondary | ICD-10-CM | POA: Diagnosis not present

## 2022-07-19 LAB — BASIC METABOLIC PANEL
Anion gap: 8 (ref 5–15)
BUN: 15 mg/dL (ref 8–23)
CO2: 35 mmol/L — ABNORMAL HIGH (ref 22–32)
Calcium: 8.6 mg/dL — ABNORMAL LOW (ref 8.9–10.3)
Chloride: 95 mmol/L — ABNORMAL LOW (ref 98–111)
Creatinine, Ser: 0.71 mg/dL (ref 0.61–1.24)
GFR, Estimated: >60 mL/min (ref >60)
Glucose, Bld: 113 mg/dL — ABNORMAL HIGH (ref 70–99)
Potassium: 3.5 mmol/L (ref 3.5–5.1)
Sodium: 138 mmol/L (ref 135–145)

## 2022-07-19 LAB — MAGNESIUM: Magnesium: 1.8 mg/dL (ref 1.7–2.4)

## 2022-07-19 MED ORDER — LACTULOSE 10 GM/15ML PO SOLN
30.0000 g | Freq: Once | ORAL | Status: AC
  Administered 2022-07-19: 30 g via ORAL
  Filled 2022-07-19: qty 60

## 2022-07-19 MED ORDER — ORAL CARE MOUTH RINSE: 15.0000 mL | OROMUCOSAL | Status: DC | PRN

## 2022-07-19 MED ORDER — POLYETHYLENE GLYCOL 3350 17 G PO PACK
17.0000 g | PACK | Freq: Two times a day (BID) | ORAL | Status: DC
  Administered 2022-07-19 – 2022-07-21 (×4): 17 g via ORAL
  Filled 2022-07-19 (×4): qty 1

## 2022-07-19 NOTE — Progress Notes (Signed)
Progress Note   Patient: Marcus Coleman:[id number] DOB: 1940/06/23 DOA: 07/15/2022     3 DOS: the patient was seen and examined on 07/19/2022   Brief hospital course: HPI on admission 07/15/22, per Dr. Clyde Lundborg: "Marcus Coleman is a 82 y.o. male with medical history significant of hypertension, hyperlipidemia, CAD, GERD, GI bleeding, BPH, anemia, atrial fibrillation, DVT not on anticoagulants, chronic back pain, who presents with fall, back pain.   Pt states that he had appx one pint of wine with dinner around 1900 yesterday. Patient thinks he may have fallen asleep in his computer chair and struck his back after fall.  He has chronic back pain, which has worsened significantly.  The pain is located in middle and lower back, constant, sharp, severe, nonradiating.  It is aggravated by movement. No leg numbness or weakness.  No incontinence.  Denies head or neck injury.  Patient denies shortness of breath, but was found to have oxygen desaturation to 82% on room air, which increased to 99% on 2 to 3 L oxygen.  Patient initially had 95% of oxygen saturation in room air. His oxygen desaturation happened after receiving morphine in ED.  ...  Patient has bilateral lower leg edema..."  Patient was admitted to the hospital for further evaluation and management.    CT of Lumbar spine showed a chronic T12 compression fracture, S-shaped scoliosis and multi-level DDD and spinal stenosis  He was started on IV Lasix and Echo ordered given signs of volume overload.  Pt later reported missing doses of his home diuretic.  Seen by PT and OT who are recommending SNF for rehab at discharge.   Assessment and Plan: * Acute back pain Patient's back pain is a little higher than T12 with a chronic compression fracture.  Will get an MRI of the thoracic spine for further evaluation.  Patient having pains down both of his legs.  Oxygen desaturation Occurred in ED after given IV morphine.  CTA negative for PE.   BLE doppler  U/S negative for DVT.   Resolved.  CAD (coronary artery disease) Stable. No chest pain. --Not on ASA due to hx of GI bleeds  Iron deficiency anemia Last hemoglobin 11.1  Benign prostatic hyperplasia with lower urinary tract symptoms --Flomax  History of DVT (deep vein thrombosis) Not on anticoagulants.   No DVT or PE on dopplers and CTA chest this admission. --Lovenox for DVT prophylaxis  Persistent atrial fibrillation (HCC) Rate controlled --Continue Cardizem --Not on anticoagulation due to hx of GI bleeding, falls risk  HTN (hypertension) --IV hydralazine as needed --Continue Cardizem  Alcohol abuse No withdrawal signs.         Subjective: Patient complaining of a lot of back pain when he bends and twists and moves around.  States he cannot go home like this.  Patient points to his back a little higher than where T12 would be.  Physical Exam: Vitals:   07/18/22 1653 07/18/22 2056 07/19/22 0055 07/19/22 0818  BP: (!) 96/59 117/70 (!) 95/55 104/70  Pulse: 80 93 77 68  Resp: 14  16 14   Temp: 97.8 F (36.6 C)  97.6 F (36.4 C) 97.7 F (36.5 C)  TempSrc:      SpO2: 98% 96% 94% 97%  Weight:      Height:       Physical Exam HENT:     Head: Normocephalic.     Mouth/Throat:     Pharynx: No oropharyngeal exudate.  Eyes:  General: Lids are normal.     Conjunctiva/sclera: Conjunctivae normal.  Cardiovascular:     Rate and Rhythm: Normal rate and regular rhythm.     Heart sounds: Normal heart sounds, S1 normal and S2 normal.  Pulmonary:     Breath sounds: No decreased breath sounds, wheezing, rhonchi or rales.  Abdominal:     Palpations: Abdomen is soft.     Tenderness: There is no abdominal tenderness.  Musculoskeletal:     Right lower leg: Swelling present.     Left lower leg: Swelling present.     Comments: Patient in palpating over the mid thoracic spine.  Skin:    General: Skin is warm.     Findings: No rash.  Neurological:     Mental Status:  He is alert and oriented to person, place, and time.     Comments: Able to straight leg raise bilaterally.     Data Reviewed: Creatinine 0.71  Family Communication: Updated wife on the phone  Disposition: Status is: Inpatient Remains inpatient appropriate because: Ordering MRI of the thoracic spine.  Planned Discharge Destination: Rehab    Time spent: 28 minutes  Author: Alford Highland, MD 07/19/2022 4:52 PM  For on call review [email or web address].

## 2022-07-19 NOTE — TOC Progression Note (Signed)
Transition of Care [facility]) - Progression Note    Patient Details  Name: Marcus Coleman MRN: [id number] Date of Birth: 12-22-1940  Transition of Care [facility]) CM/SW Contact  Garret Reddish, RN Phone Number: 07/19/2022, 3:08 PM  Clinical Narrative:   Bed offers reviewed with patient.  Patient accepted bed offer at [facility].  Patient continues to have acute on chronic back pain. Awaiting Neurosurgery consult.  Also noted that patient has orders for MRI of Thoracic Spine.  TOC will continue to follow for discharge planning.      Expected Discharge Plan: Skilled Nursing Facility Barriers to Discharge: No Barriers Identified  Expected Discharge Plan and Services   Discharge Planning Services: CM Consult Post Acute Care Choice: Skilled Nursing Facility Living arrangements for the past 2 months: Single Family Home                                       Social Determinants of Health (SDOH) Interventions SDOH Screenings   Food Insecurity: No Food Insecurity (07/15/2022)  Housing: Low Risk  (07/15/2022)  Transportation Needs: No Transportation Needs (07/15/2022)  Utilities: Not At Risk (07/15/2022)  Tobacco Use: Medium Risk (07/15/2022)    Readmission Risk Interventions     No data to display

## 2022-07-19 NOTE — Progress Notes (Signed)
Mobility Specialist - Progress Note   07/19/22 1341  Mobility  Activity Ambulated with assistance in hallway;Stood at bedside;Dangled on edge of bed  Level of Assistance Standby assist, set-up cues, supervision of patient - no hands on  Assistive Device Front wheel walker  Distance Ambulated (ft) 50 ft  Activity Response Tolerated well  Mobility Referral Yes  $Mobility charge 1 Mobility  Mobility Specialist Start Time (ACUTE ONLY) 1115  Mobility Specialist Stop Time (ACUTE ONLY) 1138  Mobility Specialist Time Calculation (min) (ACUTE ONLY) 23 min   Pt supine in bed on RA upon arrival. Pt completes bed mobility with HHA and extra time. Pt STS and ambulates in hallway SBA with no LOB. Pt returns to bed with needs in reach.   On 2L  SPO2 = 93  Heart Rate = 97   [facility]  Mobility Specialist  07/19/22 1:45 PM

## 2022-07-19 NOTE — Consult Note (Signed)
[facility] Customer service manager [facility]) Accountable Care Organization (ACO) [facility] Liaison Note  07/19/2022  Marcus Coleman Aug 11, 1940 [id number]  Location: [facility] RN Hospital Liaison met patient at bedside at [facility].  Insurance: [employer]   Marcus Coleman is a 82 y.o. male who is a Primary Care Patient of [place], Duane Lope, MD [facility]). The patient was screened for  readmission hospitalization with noted low risk score for unplanned readmission risk with 2 IP in 6 months.  The patient was assessed for potential [facility]) Care Management service needs for post hospital transition for care coordination. Review of patient's electronic medical record reveals patient disposition for discharge to a SNF pending bed offers. [facility] liaison spoke with pt at bedside and verified no SDOH as pt has a supportive wife Marcus Coleman) when he returns home from the SNF (currently pending bed offer). SNF will manage pt's needs upon his discharge. No further needs at this time.    [facility] Care Management/Population Health does not replace or interfere with any arrangements made by the Inpatient Transition of Care team.   For questions contact:   Elliot Cousin, RN, BSN [facility] Liaison [place]   [facility]  Population Health Office Hours MTWF 8:00 am to 6 pm off on Thursday [phone] mobile [phone] [Office toll free line][facility] Office Hours are M-F 8:30 - 5 pm 24 hour nurse advise line [phone] Conceirge  [email or web address]

## 2022-07-19 NOTE — Progress Notes (Signed)
PT Cancellation Note  Patient Details Name: Marcus Coleman MRN: [id number] DOB: 09-24-1940   Cancelled Treatment:     [name] has attempted to treat pt several times throughout the day. Pt was either on [facility] or had other medical professionals working with him. Currently pt reports feeling too fatigue to participate. He request PT to return in the morning. Pt was able to work with mobility team and has been OOB several times throughout the day. Pt seems to be progressing well. Awaiting MRI/ Neurosurgery consult.    [name] 07/19/2022, 3:37 PM

## 2022-07-19 NOTE — Progress Notes (Signed)
   07/19/22 1500  Spiritual Encounters  Type of Visit Initial  Care provided to: Patient  Referral source Chaplain assessment  Reason for visit Routine spiritual support  OnCall Visit No  Spiritual Framework  Presenting Themes Courage hope and growth  Interventions  Spiritual Care Interventions Made Established relationship of care and support  Intervention Outcomes  Outcomes Connection to values and goals of care  Spiritual Care Plan  Spiritual Care Issues Still Outstanding No further spiritual care needs at this time (see row info)   Chaplain provided spiritual support with compassionate presence and reflective listening. Patient celebrating his birthday today. Pleasant mood and affect.

## 2022-07-20 DIAGNOSIS — R0902 Hypoxemia: Secondary | ICD-10-CM | POA: Diagnosis not present

## 2022-07-20 DIAGNOSIS — R3911 Hesitancy of micturition: Secondary | ICD-10-CM

## 2022-07-20 DIAGNOSIS — R110 Nausea: Secondary | ICD-10-CM | POA: Insufficient documentation

## 2022-07-20 DIAGNOSIS — K5900 Constipation, unspecified: Secondary | ICD-10-CM | POA: Insufficient documentation

## 2022-07-20 DIAGNOSIS — K5909 Other constipation: Secondary | ICD-10-CM

## 2022-07-20 DIAGNOSIS — D508 Other iron deficiency anemias: Secondary | ICD-10-CM | POA: Diagnosis not present

## 2022-07-20 DIAGNOSIS — I2510 Atherosclerotic heart disease of native coronary artery without angina pectoris: Secondary | ICD-10-CM | POA: Diagnosis not present

## 2022-07-20 DIAGNOSIS — M546 Pain in thoracic spine: Secondary | ICD-10-CM | POA: Diagnosis not present

## 2022-07-20 MED ORDER — ENSURE ENLIVE PO LIQD
237.0000 mL | Freq: Two times a day (BID) | ORAL | Status: DC
  Administered 2022-07-20 – 2022-07-21 (×2): 237 mL via ORAL

## 2022-07-20 NOTE — TOC Progression Note (Addendum)
Transition of Care [facility]) - Progression Note    Patient Details  Name: Marcus Coleman MRN: [id number] Date of Birth: [birth year]  Transition of Care [facility]) CM/SW Contact  Darleene Cleaver, [place] Phone Number: 07/20/2022, 12:11 PM  Clinical Narrative:     Patient accepted bed at [facility] pending insurance authorization.  Patient is not service connected and wants to use his Health [employer] Advantage for SNF placement.      CSW asked if PT is able to see patient today for an updated therapy note, and then insurance auth can be started.   Per Sue Lush, if patient is ready for discharge over the weekend have TOC call her on her work cell phone [phone].  TOC to continue to follow patient's progress throughout discharge planning.  Expected Discharge Plan: Skilled Nursing Facility Barriers to Discharge: No Barriers Identified  Expected Discharge Plan and Services   Discharge Planning Services: CM Consult Post Acute Care Choice: Skilled Nursing Facility Living arrangements for the past 2 months: Single Family Home                                       Social Determinants of Health (SDOH) Interventions SDOH Screenings   Food Insecurity: No Food Insecurity (07/15/2022)  Housing: Low Risk  (07/15/2022)  Transportation Needs: No Transportation Needs (07/15/2022)  Utilities: Not At Risk (07/15/2022)  Tobacco Use: Medium Risk (07/15/2022)    Readmission Risk Interventions     No data to display

## 2022-07-20 NOTE — Progress Notes (Signed)
Progress Note   Patient: Marcus Coleman ZOX:[id number] DOB: 12/15/1940 DOA: 07/15/2022     4 DOS: the patient was seen and examined on 07/20/2022   Brief hospital course: HPI on admission 07/15/22, per Dr. Clyde Coleman: "Marcus Coleman is a 82 y.o. male with medical history significant of hypertension, hyperlipidemia, CAD, GERD, GI bleeding, BPH, anemia, atrial fibrillation, DVT not on anticoagulants, chronic back pain, who presents with fall, back pain.   Pt states that he had appx one pint of wine with dinner around 1900 yesterday. Patient thinks he may have fallen asleep in his computer chair and struck his back after fall.  He has chronic back pain, which has worsened significantly.  The pain is located in middle and lower back, constant, sharp, severe, nonradiating.  It is aggravated by movement. No leg numbness or weakness.  No incontinence.  Denies head or neck injury.  Patient denies shortness of breath, but was found to have oxygen desaturation to 82% on room air, which increased to 99% on 2 to 3 L oxygen.  Patient initially had 95% of oxygen saturation in room air. His oxygen desaturation happened after receiving morphine in ED.  ...  Patient has bilateral lower leg edema..."  Patient was admitted to the hospital for further evaluation and management.    CT of Lumbar spine showed a chronic T12 compression fracture, S-shaped scoliosis and multi-level DDD and spinal stenosis  He was started on IV Lasix and Echo ordered given signs of volume overload.  Pt later reported missing doses of his home diuretic.  Seen by PT and OT who are recommending SNF for rehab at discharge.   5/9.  Patient feeling nauseous this morning.  Reviewed MRI of the thoracic spine with the patient and shows no acute abnormality of the thoracic spine.  Chronic compression deformity of T12 and multilevel degenerative disc disease seen.  Assessment and Plan: * Acute back pain Patient's back pain is a little higher than T12 with a  chronic compression fracture.   Patient having pains down both of his legs.  MRI of the thoracic spine negative.  Did better with PT today walking 35 feet.  Oxygen desaturation Occurred in ED after given IV morphine.  CTA negative for PE.   BLE doppler U/S negative for DVT.   Resolved.  CAD (coronary artery disease) Stable. No chest pain. --Not on ASA due to hx of GI bleeds  Iron deficiency anemia Last hemoglobin 11.1  Benign prostatic hyperplasia with lower urinary tract symptoms --Flomax  History of DVT (deep vein thrombosis) Not on anticoagulants.   No DVT or PE on dopplers and CTA chest this admission. --Lovenox for DVT prophylaxis  Persistent atrial fibrillation (HCC) Rate controlled --Continue Cardizem --Not on anticoagulation due to hx of GI bleeding, falls risk  HTN (hypertension) --IV hydralazine as needed --Continue Cardizem  Alcohol abuse No withdrawal signs.   Constipation Had small bowel movement yesterday.  Continue MiraLAX twice daily.  Nausea Advised he must eat with pain medications.  Started Ensure.        Subjective: Patient feeling nauseous and received some IV Zofran.  Advised that he needs to eat if he is taking pain medications.  Back pain a little bit less.  Physical Exam: Vitals:   07/19/22 1719 07/19/22 2341 07/20/22 0500 07/20/22 0827  BP: 112/75 112/70  103/67  Pulse: 75 87  72  Resp: 15 18  16   Temp: 97.9 F (36.6 C) 97.8 F (36.6 C)  98.4 F (  36.9 C)  TempSrc:      SpO2: 98% 95%  90%  Weight:   83.1 kg   Height:       Physical Exam HENT:     Mouth/Throat:     Pharynx: No oropharyngeal exudate.  Eyes:     General: Lids are normal.     Conjunctiva/sclera: Conjunctivae normal.  Cardiovascular:     Rate and Rhythm: Normal rate and regular rhythm.     Heart sounds: Normal heart sounds, S1 normal and S2 normal.  Pulmonary:     Breath sounds: No decreased breath sounds, wheezing, rhonchi or rales.  Abdominal:      Palpations: Abdomen is soft.     Tenderness: There is no abdominal tenderness.  Musculoskeletal:     Right lower leg: No swelling.     Left lower leg: No swelling.  Skin:    General: Skin is warm.     Findings: No rash.  Neurological:     Mental Status: He is alert and oriented to person, place, and time.     Data Reviewed: MRI of the thoracic spine does not show any acute abnormality of the thoracic spine does have a chronic compression deformity at T12 and multilevel degenerative disc disease.  They did see low T1/T2 weighted signal intensity throughout the spinal column which is nonspecific and can be seen in the setting of anemia, smoking, obesity or other marrow infiltrative processes.  Case discussed with Dr. Orlie Coleman his hematologist and has not seen a marrow infiltrative process being diagnosed by MRI before.  Probably secondary to his history of anemia.  Patient does not smoke.  Family Communication: Updated patient's wife on the phone  Disposition: Status is: Inpatient Remains inpatient appropriate because: Patient feeling nauseous today and did not eat much.  Added Ensure.  Planned Discharge Destination: Rehab, likely on 5/10.    Time spent: 28 minutes  Author: Alford Highland, MD 07/20/2022 2:57 PM  For on call review [email or web address].

## 2022-07-20 NOTE — Assessment & Plan Note (Addendum)
Advised he must eat with pain medications.  Started Ensure.  No nausea today

## 2022-07-20 NOTE — Progress Notes (Signed)
Physical Therapy Treatment Patient Details Name: Marcus Coleman MRN: [id number] DOB: [birth year] Today's Date: 07/20/2022   History of Present Illness Monique Hoeg is a 81yoM who comes to [facility] ED on 5/3 after fall from office chair to floor at home and subsequent acute on chronic low back pain, acute neck pain. 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 DVT/PE studies also negative. PMH: HTN, HLD CAD, GERD, GIB, BPH, AF and DVT not on anticoagulants, chronic back pain.    PT Comments    Pt in bed on entry, author asks about pain meds prior to therapy, which he is agreeable to. Author coordinates analgesia with nursing, then returns later. Pt still anxious about mobility given his persistent pain. Meds appear to be working well, as it time, as pt is able to move with high velocities, less grimacing/moaning, less requisite physical assistance. Pt still requires elevated Pt also able to progress his AMB distance, although he clearly remains in pain. Pt educated on importance of getting OOB daily, that trips to [facility] are not sufficient to meet pulmonary toilet goals. Pt acknowledges educational interventions. Pt left at EOB with meal tray in place, all needs met. Will continue to follow. Author explained he would look into hot pack which has not come to fruition over past few days.     Recommendations for follow up therapy are one component of a multi-disciplinary discharge planning process, led by the attending physician.  Recommendations may be updated based on patient status, additional functional criteria and insurance authorization.  Follow Up Recommendations  Can patient physically be transported by private vehicle: No    Assistance Recommended at Discharge Intermittent Supervision/Assistance  Patient can return home with the following Two people to help with walking and/or transfers;Assistance with cooking/housework;Direct supervision/assist  for medications management;Assist for transportation;Help with stairs or ramp for entrance;Two people to help with bathing/dressing/bathroom   Equipment Recommendations  Rolling walker (2 wheels);BSC/3in1    Recommendations for Other Services       Precautions / Restrictions Precautions Precautions: Fall Restrictions Weight Bearing Restrictions: No     Mobility  Bed Mobility   Bed Mobility: Supine to Sit   Sidelying to sit: Supervision, HOB elevated            Transfers Overall transfer level: Needs assistance Equipment used: Rolling walker (2 wheels)   Sit to Stand: From elevated surface, Min guard                Ambulation/Gait Ambulation/Gait assistance: Min guard Gait Distance (Feet): 35 Feet Assistive device: Rolling walker (2 wheels) Gait Pattern/deviations: Step-to pattern, Step-through pattern           Stairs             Wheelchair Mobility    Modified Rankin (Stroke Patients Only)       Balance                                            Cognition Arousal/Alertness: Awake/alert Behavior During Therapy: WFL for tasks assessed/performed Overall Cognitive Status: Within Functional Limits for tasks assessed                                          Exercises Other Exercises  Other Exercises: seated at EOB, therapeutic use of self for low back stretching, pt asks for help with massage of back    General Comments        Pertinent Vitals/Pain Pain Assessment Pain Assessment: 0-10 Pain Score: 3  Pain Location: Rt low back  above posterior crest Pain Intervention(s): Limited activity within patient's tolerance, Premedicated before session, Repositioned    Home Living                          Prior Function            PT Goals (current goals can now be found in the care plan section) Acute Rehab PT Goals Patient Stated Goal: get better PT Goal Formulation: With patient Time  For Goal Achievement: 07/30/22 Potential to Achieve Goals: Good Progress towards PT goals: Progressing toward goals    Frequency    Min 3X/week      PT Plan Current plan remains appropriate;Frequency needs to be updated    Co-evaluation              AM-PAC PT "6 Clicks" Mobility   Outcome Measure  Help needed turning from your back to your side while in a flat bed without using bedrails?: A Little Help needed moving from lying on your back to sitting on the side of a flat bed without using bedrails?: A Little Help needed moving to and from a bed to a chair (including a wheelchair)?: A Lot Help needed standing up from a chair using your arms (e.g., wheelchair or bedside chair)?: A Lot Help needed to walk in hospital room?: A Little Help needed climbing 3-5 steps with a railing? : A Lot 6 Click Score: 15    End of Session Equipment Utilized During Treatment: Gait belt Activity Tolerance: Patient limited by fatigue;Patient limited by pain Patient left: in bed;with call bell/phone within reach;with bed alarm set Nurse Communication: Mobility status PT Visit Diagnosis: Pain;Difficulty in walking, not elsewhere classified (R26.2) Pain - part of body:  (low back)     Time: 0981-1914 PT Time Calculation (min) (ACUTE ONLY): 23 min  Charges:  $Therapeutic Activity: 23-37 mins                    1:48 PM, 07/20/22 Rosamaria Lints, PT, DPT Physical Therapist - [facility]  [phone] (ASCOM)    Azka Steger C 07/20/2022, 1:33 PM

## 2022-07-20 NOTE — TOC Progression Note (Signed)
Transition of Care [facility]) - Progression Note    Patient Details  Name: Marcus Coleman MRN: [id number] Date of Birth: 10/06/1940  [facility] CM/SW Contact  Marlowe Sax, RN Phone Number: 07/20/2022, 1:37 PM  Clinical Narrative:   Sherron Monday with Tammy at [facility], I requested Ins approval for the patient to go to [facility] and for EMS thru [facility] for transport, They will process and call with decision    Expected Discharge Plan: Skilled Nursing Facility Barriers to Discharge: No Barriers Identified  Expected Discharge Plan and Services   Discharge Planning Services: CM Consult Post Acute Care Choice: Skilled Nursing Facility Living arrangements for the past 2 months: Single Family Home                                       Social Determinants of Health (SDOH) Interventions SDOH Screenings   Food Insecurity: No Food Insecurity (07/15/2022)  Housing: Low Risk  (07/15/2022)  Transportation Needs: No Transportation Needs (07/15/2022)  Utilities: Not At Risk (07/15/2022)  Tobacco Use: Medium Risk (07/15/2022)    Readmission Risk Interventions     No data to display

## 2022-07-20 NOTE — Assessment & Plan Note (Addendum)
Had small bowel movement yesterday.  Continue MiraLAX twice daily.  Can cut back on MiraLAX if having bowel movements.

## 2022-07-20 NOTE — Progress Notes (Signed)
Mobility Specialist - Progress Note   07/20/22 1053  Mobility  Activity Ambulated with assistance in room;Transferred to/from BSC  Level of Assistance Moderate assist, patient does 50-74%  Assistive Device None  Distance Ambulated (ft) 5 ft  Activity Response Tolerated well  Mobility Referral Yes  $Mobility charge 1 Mobility  Mobility Specialist Start Time (ACUTE ONLY) 1033  Mobility Specialist Stop Time (ACUTE ONLY) 1050  Mobility Specialist Time Calculation (min) (ACUTE ONLY) 17 min   Pt sitting on BSC on RA upon arrival. Pt STS MODA. Pt needs MaxA for pericare. Pt ambulates from [facility] to bed CGA. Pt left in bed with needs in reach and RN present.   Terrilyn Saver  Mobility Specialist  07/20/22 10:56 AM

## 2022-07-21 ENCOUNTER — Other Ambulatory Visit: Payer: Self-pay | Admitting: Student

## 2022-07-21 DIAGNOSIS — Z9181 History of falling: Secondary | ICD-10-CM | POA: Diagnosis not present

## 2022-07-21 DIAGNOSIS — G8929 Other chronic pain: Secondary | ICD-10-CM | POA: Diagnosis not present

## 2022-07-21 DIAGNOSIS — R600 Localized edema: Secondary | ICD-10-CM | POA: Insufficient documentation

## 2022-07-21 DIAGNOSIS — M4805 Spinal stenosis, thoracolumbar region: Secondary | ICD-10-CM | POA: Diagnosis not present

## 2022-07-21 DIAGNOSIS — M4854XD Collapsed vertebra, not elsewhere classified, thoracic region, subsequent encounter for fracture with routine healing: Secondary | ICD-10-CM | POA: Diagnosis not present

## 2022-07-21 DIAGNOSIS — K5909 Other constipation: Secondary | ICD-10-CM | POA: Diagnosis not present

## 2022-07-21 DIAGNOSIS — R030 Elevated blood-pressure reading, without diagnosis of hypertension: Secondary | ICD-10-CM | POA: Diagnosis not present

## 2022-07-21 DIAGNOSIS — M545 Low back pain, unspecified: Secondary | ICD-10-CM | POA: Diagnosis not present

## 2022-07-21 DIAGNOSIS — E7800 Pure hypercholesterolemia, unspecified: Secondary | ICD-10-CM | POA: Diagnosis not present

## 2022-07-21 DIAGNOSIS — N400 Enlarged prostate without lower urinary tract symptoms: Secondary | ICD-10-CM | POA: Diagnosis not present

## 2022-07-21 DIAGNOSIS — S22080K Wedge compression fracture of T11-T12 vertebra, subsequent encounter for fracture with nonunion: Secondary | ICD-10-CM | POA: Diagnosis not present

## 2022-07-21 DIAGNOSIS — I340 Nonrheumatic mitral (valve) insufficiency: Secondary | ICD-10-CM | POA: Diagnosis not present

## 2022-07-21 DIAGNOSIS — R0902 Hypoxemia: Secondary | ICD-10-CM | POA: Diagnosis not present

## 2022-07-21 DIAGNOSIS — R110 Nausea: Secondary | ICD-10-CM | POA: Diagnosis not present

## 2022-07-21 DIAGNOSIS — R2689 Other abnormalities of gait and mobility: Secondary | ICD-10-CM | POA: Diagnosis not present

## 2022-07-21 DIAGNOSIS — F1010 Alcohol abuse, uncomplicated: Secondary | ICD-10-CM | POA: Diagnosis not present

## 2022-07-21 DIAGNOSIS — I2510 Atherosclerotic heart disease of native coronary artery without angina pectoris: Secondary | ICD-10-CM | POA: Diagnosis not present

## 2022-07-21 DIAGNOSIS — I10 Essential (primary) hypertension: Secondary | ICD-10-CM | POA: Diagnosis not present

## 2022-07-21 DIAGNOSIS — I4811 Longstanding persistent atrial fibrillation: Secondary | ICD-10-CM | POA: Diagnosis not present

## 2022-07-21 DIAGNOSIS — K5901 Slow transit constipation: Secondary | ICD-10-CM | POA: Diagnosis not present

## 2022-07-21 DIAGNOSIS — Z8719 Personal history of other diseases of the digestive system: Secondary | ICD-10-CM | POA: Diagnosis not present

## 2022-07-21 DIAGNOSIS — M6281 Muscle weakness (generalized): Secondary | ICD-10-CM | POA: Diagnosis not present

## 2022-07-21 DIAGNOSIS — N401 Enlarged prostate with lower urinary tract symptoms: Secondary | ICD-10-CM | POA: Diagnosis not present

## 2022-07-21 DIAGNOSIS — D509 Iron deficiency anemia, unspecified: Secondary | ICD-10-CM | POA: Diagnosis not present

## 2022-07-21 DIAGNOSIS — Z86718 Personal history of other venous thrombosis and embolism: Secondary | ICD-10-CM | POA: Diagnosis not present

## 2022-07-21 DIAGNOSIS — R55 Syncope and collapse: Secondary | ICD-10-CM | POA: Diagnosis not present

## 2022-07-21 DIAGNOSIS — M546 Pain in thoracic spine: Secondary | ICD-10-CM | POA: Diagnosis not present

## 2022-07-21 DIAGNOSIS — M5135 Other intervertebral disc degeneration, thoracolumbar region: Secondary | ICD-10-CM | POA: Diagnosis not present

## 2022-07-21 DIAGNOSIS — I4819 Other persistent atrial fibrillation: Secondary | ICD-10-CM | POA: Diagnosis not present

## 2022-07-21 DIAGNOSIS — D508 Other iron deficiency anemias: Secondary | ICD-10-CM | POA: Diagnosis not present

## 2022-07-21 DIAGNOSIS — K5900 Constipation, unspecified: Secondary | ICD-10-CM | POA: Diagnosis not present

## 2022-07-21 DIAGNOSIS — K219 Gastro-esophageal reflux disease without esophagitis: Secondary | ICD-10-CM | POA: Diagnosis not present

## 2022-07-21 LAB — CBC WITH DIFFERENTIAL/PLATELET
Abs Immature Granulocytes: 0.02 10*3/uL (ref 0.00–0.07)
Basophils Absolute: 0 10*3/uL (ref 0.0–0.1)
Basophils Relative: 1 %
Eosinophils Absolute: 0.2 10*3/uL (ref 0.0–0.5)
Eosinophils Relative: 4 %
HCT: 36.9 % — ABNORMAL LOW (ref 39.0–52.0)
Hemoglobin: 11.8 g/dL — ABNORMAL LOW (ref 13.0–17.0)
Immature Granulocytes: 0 %
Lymphocytes Relative: 27 %
Lymphs Abs: 1.5 10*3/uL (ref 0.7–4.0)
MCH: 29.9 pg (ref 26.0–34.0)
MCHC: 32 g/dL (ref 30.0–36.0)
MCV: 93.7 fL (ref 80.0–100.0)
Monocytes Absolute: 0.9 10*3/uL (ref 0.1–1.0)
Monocytes Relative: 16 %
Neutro Abs: 2.9 10*3/uL (ref 1.7–7.7)
Neutrophils Relative %: 52 %
Platelets: 195 10*3/uL (ref 150–400)
RBC: 3.94 MIL/uL — ABNORMAL LOW (ref 4.22–5.81)
RDW: 18.6 % — ABNORMAL HIGH (ref 11.5–15.5)
WBC: 5.5 10*3/uL (ref 4.0–10.5)
nRBC: 0 % (ref 0.0–0.2)

## 2022-07-21 LAB — BASIC METABOLIC PANEL
Anion gap: 8 (ref 5–15)
BUN: 20 mg/dL (ref 8–23)
CO2: 37 mmol/L — ABNORMAL HIGH (ref 22–32)
Calcium: 8.7 mg/dL — ABNORMAL LOW (ref 8.9–10.3)
Chloride: 92 mmol/L — ABNORMAL LOW (ref 98–111)
Creatinine, Ser: 0.89 mg/dL (ref 0.61–1.24)
GFR, Estimated: >60 mL/min (ref >60)
Glucose, Bld: 117 mg/dL — ABNORMAL HIGH (ref 70–99)
Potassium: 4 mmol/L (ref 3.5–5.1)
Sodium: 137 mmol/L (ref 135–145)

## 2022-07-21 MED ORDER — OXYCODONE HCL 5 MG PO TABS
5.0000 mg | ORAL_TABLET | Freq: Four times a day (QID) | ORAL | 0 refills | Status: AC | PRN
Start: 2022-07-21 — End: 2022-07-29

## 2022-07-21 MED ORDER — ENSURE ENLIVE PO LIQD: 237.0000 mL | Freq: Two times a day (BID) | ORAL | 0 refills | Status: AC

## 2022-07-21 MED ORDER — VITAMIN B-1 100 MG PO TABS: 100.0000 mg | ORAL_TABLET | Freq: Every day | ORAL | 0 refills | Status: DC

## 2022-07-21 MED ORDER — POLYETHYLENE GLYCOL 3350 17 G PO PACK: 17.0000 g | PACK | Freq: Two times a day (BID) | ORAL | 0 refills | Status: DC

## 2022-07-21 MED ORDER — METHOCARBAMOL 500 MG PO TABS: 500.0000 mg | ORAL_TABLET | Freq: Three times a day (TID) | ORAL | 0 refills | Status: DC

## 2022-07-21 MED ORDER — LIDOCAINE 5 % EX PTCH: 1.0000 | MEDICATED_PATCH | CUTANEOUS | 0 refills | Status: AC

## 2022-07-21 MED ORDER — FOLIC ACID 1 MG PO TABS: 1.0000 mg | ORAL_TABLET | Freq: Every day | ORAL | 0 refills | Status: DC

## 2022-07-21 MED ORDER — OXYCODONE HCL 5 MG PO TABS: 5.0000 mg | ORAL_TABLET | Freq: Four times a day (QID) | ORAL | 0 refills | Status: DC | PRN

## 2022-07-21 MED ORDER — FLUTICASONE PROPIONATE 50 MCG/ACT NA SUSP: 1.0000 | Freq: Every day | NASAL | 2 refills | Status: AC | PRN

## 2022-07-21 MED ORDER — NYSTATIN 100000 UNIT/GM EX CREA: TOPICAL_CREAM | Freq: Two times a day (BID) | CUTANEOUS | 0 refills | Status: AC

## 2022-07-21 MED ORDER — OXYCODONE HCL 5 MG PO TABS
5.0000 mg | ORAL_TABLET | Freq: Four times a day (QID) | ORAL | 0 refills | Status: DC | PRN
Start: 2022-07-21 — End: 2022-07-21

## 2022-07-21 NOTE — TOC Transition Note (Signed)
Transition of Care [facility]) - CM/SW Discharge Note   Patient Details  Name: HILLMAN RUDISILL MRN: [id number] Date of Birth: 1940-12-10  [facility] CM/SW Contact:  Liliana Cline, LCSW Phone Number: 07/21/2022, 10:34 AM   Clinical Narrative:    SNF approved [facility] #[id number]. EMS denied per Tammy at [facility].  Spoke to spouse who states she can transport patient in her vehicle to [facility].  Discharge to [facility] today. Room 102. Confirmed with Admissions Worker Sue Lush. Updated MD, RN, and patient's spouse. Asked RN to call report and notify spouse when patient is ready for transport.     Final next level of care: Skilled Nursing Facility Barriers to Discharge: Barriers Resolved   Patient Goals and CMS Choice CMS Medicare.gov Compare Post Acute Care list provided to:: Patient Represenative (must comment) Choice offered to / list presented to : Spouse  Discharge Placement                  Patient to be transferred to facility by: spouse Name of family member notified: spouse - Antoinette Patient and family notified of of transfer: 07/21/22  Discharge Plan and Services Additional resources added to the After Visit Summary for     Discharge Planning Services: CM Consult Post Acute Care Choice: Skilled Nursing Facility                               Social Determinants of Health (SDOH) Interventions SDOH Screenings   Food Insecurity: No Food Insecurity (07/15/2022)  Housing: Low Risk  (07/15/2022)  Transportation Needs: No Transportation Needs (07/15/2022)  Utilities: Not At Risk (07/15/2022)  Tobacco Use: Medium Risk (07/15/2022)     Readmission Risk Interventions     No data to display

## 2022-07-21 NOTE — Assessment & Plan Note (Signed)
Patient was given iv lasix in the hospital and swelling improved, can go back on oral lasix at rehab

## 2022-07-21 NOTE — Progress Notes (Signed)
Patient to admit to facility, sent ot internal pharmacy.

## 2022-07-21 NOTE — Discharge Summary (Signed)
Physician Discharge Summary   Patient: Marcus Coleman MRN: [id number] DOB: 05/11/1940  Admit date:     07/15/2022  Discharge date: 07/21/22  Discharge Physician: Alford Highland   PCP: [name], MD   Recommendations at discharge:   Follow-up team at rehab 1 day  Discharge Diagnoses: Principal Problem:   Acute back pain Active Problems:   Oxygen desaturation   CAD (coronary artery disease)   Iron deficiency anemia   Benign prostatic hyperplasia with lower urinary tract symptoms   History of DVT (deep vein thrombosis)   Persistent atrial fibrillation (HCC)   HTN (hypertension)   Alcohol abuse   Low back pain   Nausea   Constipation   Lower extremity edema    Hospital Course: HPI on admission 07/15/22, per Dr. Clyde Lundborg: "Marcus Coleman is a 82 y.o. male with medical history significant of hypertension, hyperlipidemia, CAD, GERD, GI bleeding, BPH, anemia, atrial fibrillation, DVT not on anticoagulants, chronic back pain, who presents with fall, back pain.   Pt states that he had appx one pint of wine with dinner around 1900 yesterday. Patient thinks he may have fallen asleep in his computer chair and struck his back after fall.  He has chronic back pain, which has worsened significantly.  The pain is located in middle and lower back, constant, sharp, severe, nonradiating.  It is aggravated by movement. No leg numbness or weakness.  No incontinence.  Denies head or neck injury.  Patient denies shortness of breath, but was found to have oxygen desaturation to 82% on room air, which increased to 99% on 2 to 3 L oxygen.  Patient initially had 95% of oxygen saturation in room air. His oxygen desaturation happened after receiving morphine in ED.  ...  Patient has bilateral lower leg edema..."  Patient was admitted to the hospital for further evaluation and management.    CT of Lumbar spine showed a chronic T12 compression fracture, S-shaped scoliosis and multi-level DDD and spinal  stenosis  He was started on IV Lasix and Echo ordered given signs of volume overload.  Pt later reported missing doses of his home diuretic.  Seen by PT and OT who are recommending SNF for rehab at discharge.   5/9.  Patient feeling nauseous this morning.  Reviewed MRI of the thoracic spine with the patient and shows no acute abnormality of the thoracic spine.  Chronic compression deformity of T12 and multilevel degenerative disc disease seen.  Patient walked better with physical therapy in the afternoon walking 35 feet. 5/10.  Patient stable to go out to rehab.  Assessment and Plan: * Acute back pain Patient's back pain is a little higher than T12 with a chronic compression fracture.   Patient having pains down both of his legs.  MRI of the thoracic spine negative.  Did better with PT yesterday walking 35 feet.  Patient with less pain today.  Oxygen desaturation Occurred in ED after given IV morphine.  CTA negative for PE.   BLE doppler U/S negative for DVT.   Resolved.  CAD (coronary artery disease) Stable. No chest pain. --Not on ASA due to hx of GI bleeds  Iron deficiency anemia Last hemoglobin 11.8  Benign prostatic hyperplasia with lower urinary tract symptoms --Flomax  History of DVT (deep vein thrombosis) Not on anticoagulants.   No DVT or PE on dopplers and CTA chest this admission. --Lovenox for DVT prophylaxis  Persistent atrial fibrillation (HCC) Rate controlled --Continue Cardizem --Not on anticoagulation due to hx  of GI bleeding, falls risk  HTN (hypertension) --IV hydralazine as needed --Continue Cardizem  Alcohol abuse No withdrawal signs.  On folic acid and thiamine   Lower extremity edema Patient was given iv lasix in the hospital and swelling improved, can go back on oral lasix at rehab  Constipation Had small bowel movement yesterday.  Continue MiraLAX twice daily.  Can cut back on MiraLAX if having bowel movements.  Nausea Advised he must eat  with pain medications.  Started Ensure.  No nausea today         Consultants: none Procedures performed: none Disposition: Rehabilitation facility Diet recommendation:  Cardiac diet DISCHARGE MEDICATION: Allergies as of 07/21/2022       Reactions   Nsaids    Can take Ibuprofen in moderation/high doses or prolonged use causes GI bleed        Medication List     STOP taking these medications    HYDROcodone-acetaminophen 5-325 MG tablet Commonly known as: NORCO/VICODIN       TAKE these medications    acetaminophen 650 MG CR tablet Commonly known as: TYLENOL Take 650 mg by mouth every 8 (eight) hours as needed for pain.   cetirizine 10 MG tablet Commonly known as: ZYRTEC Take 10 mg by mouth every morning.   clindamycin 1 % external solution Commonly known as: CLEOCIN T Apply 1 % topically as needed. (Apply to head)   diltiazem 30 MG tablet Commonly known as: CARDIZEM Take 1 tablet (30 mg total) by mouth every 12 (twelve) hours.   feeding supplement Liqd Take 237 mLs by mouth 2 (two) times daily between meals.   fluticasone 50 MCG/ACT nasal spray Commonly known as: FLONASE Place 1 spray into both nostrils daily as needed for allergies or rhinitis.   folic acid 1 MG tablet Commonly known as: FOLVITE Take 1 tablet (1 mg total) by mouth daily.   furosemide 40 MG tablet Commonly known as: LASIX Take 40 mg by mouth every morning.   lidocaine 5 % Commonly known as: LIDODERM Place 1 patch onto the skin daily. Remove & Discard patch within 12 hours or as directed by MD   methocarbamol 500 MG tablet Commonly known as: ROBAXIN Take 1 tablet (500 mg total) by mouth 3 (three) times daily for 10 days.   multivitamin with minerals Tabs tablet Take 1 tablet by mouth daily.   nystatin cream Commonly known as: MYCOSTATIN Apply topically 2 (two) times daily.   oxyCODONE 5 MG immediate release tablet Commonly known as: Oxy IR/ROXICODONE Take 1 tablet (5 mg  total) by mouth every 6 (six) hours as needed for up to 5 days for severe pain.   pantoprazole 40 MG tablet Commonly known as: PROTONIX Take 1 tablet (40 mg total) by mouth daily.   polyethylene glycol 17 g packet Commonly known as: MIRALAX / GLYCOLAX Take 17 g by mouth 2 (two) times daily.   potassium chloride 10 MEQ tablet Commonly known as: KLOR-CON Take 10 mEq by mouth daily.   sodium chloride 0.65 % Soln nasal spray Commonly known as: OCEAN Place 1 spray into both nostrils as needed for congestion.   tamsulosin 0.4 MG Caps capsule Commonly known as: FLOMAX Take 1 capsule (0.4 mg total) by mouth daily.   thiamine 100 MG tablet Commonly known as: Vitamin B-1 Take 1 tablet (100 mg total) by mouth daily.   timolol 0.5 % ophthalmic solution Commonly known as: TIMOPTIC Place 1 drop into both eyes daily.  Contact information for after-discharge care     Destination     [facility] .   Service: Skilled Nursing Contact information: [address] [place] [address] [phone]                    Discharge Exam: Filed Weights   07/18/22 0500 07/20/22 0500 07/21/22 0500  Weight: 91 kg 83.1 kg 82.3 kg   Physical Exam HENT:     Mouth/Throat:     Pharynx: No oropharyngeal exudate.  Eyes:     General: Lids are normal.     Conjunctiva/sclera: Conjunctivae normal.  Cardiovascular:     Rate and Rhythm: Normal rate and regular rhythm.     Heart sounds: Normal heart sounds, S1 normal and S2 normal.  Pulmonary:     Breath sounds: No decreased breath sounds, wheezing, rhonchi or rales.  Abdominal:     Palpations: Abdomen is soft.     Tenderness: There is no abdominal tenderness.  Musculoskeletal:     Right lower leg: No swelling.     Left lower leg: No swelling.  Skin:    General: Skin is warm.     Findings: No rash.  Neurological:     Mental Status: He is alert and oriented to person, place, and time.      Comments: Able to straight leg bilaterally      Condition at discharge: stable  The results of significant diagnostics from this hospitalization (including imaging, microbiology, ancillary and laboratory) are listed below for reference.   Imaging Studies: MR THORACIC SPINE WO CONTRAST  Result Date: 07/20/2022 CLINICAL DATA:  Fall with back pain EXAM: MRI THORACIC SPINE WITHOUT CONTRAST TECHNIQUE: Multiplanar, multisequence MR imaging of the thoracic spine was performed. No intravenous contrast was administered. COMPARISON:  None Available. FINDINGS: Alignment:  Physiologic. Vertebrae: Diffusely low T1/T2-weighted signal intensity throughout the spinal column. Chronic compression deformity of T12. Inferior T11 endplate Modic type 2 signal change. Cord:  Normal signal and morphology. Paraspinal and other soft tissues: Negative. Disc levels: There is mild multilevel degenerative disc disease without spinal canal or neural foraminal stenosis. IMPRESSION: 1. No acute abnormality of the thoracic spine. 2. Diffusely low T1/T2-weighted signal intensity throughout the spinal column, which is nonspecific and can be seen in the setting of anemia, smoking, obesity or other marrow infiltrative processes. 3. Chronic compression deformity of T12. 4. Mild multilevel degenerative disc disease without spinal canal or neural foraminal stenosis. Electronically Signed   By: Deatra Robinson M.D.   On: 07/20/2022 02:33   ECHOCARDIOGRAM COMPLETE  Result Date: 07/18/2022    ECHOCARDIOGRAM REPORT   Patient Name:   DAZHON NEWITT Date of Exam: 07/17/2022 Medical Rec #:  [id number]      Height:       66.0 in Accession #:    [id number]     Weight:       199.2 lb Date of Birth:  May 11, 1940       BSA:          1.996 m Patient Age:    81 years       BP:           108/84 mmHg Patient Gender: M              HR:           81 bpm. Exam Location:  [facility] Procedure: 2D Echo, Cardiac Doppler and Color Doppler Indications:     CHF---acute diastolic  I50.31  History:         Patient has prior history of Echocardiogram examinations, most                  recent 02/11/2022. Arrythmias:Atrial Fibrillation,                  Signs/Symptoms:Murmur; Risk Factors:Hypertension.  Sonographer:     Cristela Blue Referring Phys:  [id number] Brien Few NIU Diagnosing Phys: Marcina Millard MD IMPRESSIONS  1. Left ventricular ejection fraction, by estimation, is 55 to 60%. The left ventricle has normal function. The left ventricle has no regional wall motion abnormalities. Indeterminate diastolic filling due to E-A fusion.  2. Right ventricular systolic function is normal. The right ventricular size is normal.  3. Right atrial size was mild to moderately dilated.  4. The mitral valve is normal in structure. Mild to moderate mitral valve regurgitation. No evidence of mitral stenosis.  5. The aortic valve is normal in structure. Aortic valve regurgitation is trivial. No aortic stenosis is present.  6. The inferior vena cava is normal in size with greater than 50% respiratory variability, suggesting right atrial pressure of 3 mmHg. FINDINGS  Left Ventricle: Left ventricular ejection fraction, by estimation, is 55 to 60%. The left ventricle has normal function. The left ventricle has no regional wall motion abnormalities. The left ventricular internal cavity size was normal in size. There is  no left ventricular hypertrophy. Indeterminate diastolic filling due to E-A fusion. Right Ventricle: The right ventricular size is normal. No increase in right ventricular wall thickness. Right ventricular systolic function is normal. Left Atrium: Left atrial size was normal in size. Right Atrium: Right atrial size was mild to moderately dilated. Pericardium: There is no evidence of pericardial effusion. Mitral Valve: The mitral valve is normal in structure. Mild to moderate mitral valve regurgitation. No evidence of mitral valve stenosis. MV peak gradient, 5.3 mmHg. The mean mitral valve gradient is 2.0  mmHg. Tricuspid Valve: The tricuspid valve is normal in structure. Tricuspid valve regurgitation is mild . No evidence of tricuspid stenosis. Aortic Valve: The aortic valve is normal in structure. Aortic valve regurgitation is trivial. No aortic stenosis is present. Aortic valve mean gradient measures 3.0 mmHg. Aortic valve peak gradient measures 5.4 mmHg. Aortic valve area, by VTI measures 2.47 cm. Pulmonic Valve: The pulmonic valve was normal in structure. Pulmonic valve regurgitation is not visualized. No evidence of pulmonic stenosis. Aorta: The aortic root is normal in size and structure. Venous: The inferior vena cava is normal in size with greater than 50% respiratory variability, suggesting right atrial pressure of 3 mmHg. IAS/Shunts: No atrial level shunt detected by color flow Doppler.  LEFT VENTRICLE PLAX 2D LVIDd:         4.90 cm LVIDs:         3.50 cm LV PW:         1.10 cm LV IVS:        1.10 cm LVOT diam:     2.10 cm LV SV:         47 LV SV Index:   23 LVOT Area:     3.46 cm  RIGHT VENTRICLE RV Basal diam:  4.80 cm RV Mid diam:    3.20 cm RV S prime:     11.60 cm/s TAPSE (M-mode): 1.6 cm LEFT ATRIUM            Index        RIGHT ATRIUM  Index LA diam:      3.60 cm  1.80 cm/m   RA Area:     29.80 cm LA Vol (A2C): 111.0 ml 55.60 ml/m  RA Volume:   105.00 ml 52.59 ml/m LA Vol (A4C): 41.8 ml  20.94 ml/m  AORTIC VALVE AV Area (Vmax):    2.24 cm AV Area (Vmean):   2.43 cm AV Area (VTI):     2.47 cm AV Vmax:           116.00 cm/s AV Vmean:          79.450 cm/s AV VTI:            0.190 m AV Peak Grad:      5.4 mmHg AV Mean Grad:      3.0 mmHg LVOT Vmax:         75.00 cm/s LVOT Vmean:        55.700 cm/s LVOT VTI:          0.135 m LVOT/AV VTI ratio: 0.71  AORTA Ao Root diam: 2.83 cm MITRAL VALVE                TRICUSPID VALVE MV Area (PHT): 5.02 cm     TR Peak grad:   29.8 mmHg MV Area VTI:   1.87 cm     TR Vmax:        273.00 cm/s MV Peak grad:  5.3 mmHg MV Mean grad:  2.0 mmHg     SHUNTS  MV Vmax:       1.15 m/s     Systemic VTI:  0.14 m MV Vmean:      69.6 cm/s    Systemic Diam: 2.10 cm MV Decel Time: 151 msec MV E velocity: 109.00 cm/s Marcina Millard MD Electronically signed by Marcina Millard MD Signature Date/Time: 07/18/2022/8:35:26 AM    Final    CT Angio Chest Pulmonary Embolism (PE) W or WO Contrast  Result Date: 07/15/2022 CLINICAL DATA:  Clinical suspicion for pulmonary embolism. Shortness of breath. EXAM: CT ANGIOGRAPHY CHEST WITH CONTRAST TECHNIQUE: Multidetector CT imaging of the chest was performed using the standard protocol during bolus administration of intravenous contrast. Multiplanar CT image reconstructions and MIPs were obtained to evaluate the vascular anatomy. RADIATION DOSE REDUCTION: This exam was performed according to the departmental dose-optimization program which includes automated exposure control, adjustment of the mA and/or kV according to patient size and/or use of iterative reconstruction technique. CONTRAST:  75mL OMNIPAQUE IOHEXOL 350 MG/ML SOLN COMPARISON:  Current and prior chest radiographs. Prior chest CTA, 02/10/2022. FINDINGS: Cardiovascular: Pulmonary arteries are well opacified. There is no evidence of a pulmonary embolism. Heart normal in size. Three-vessel coronary artery calcifications. No pericardial effusion. Great vessels are normal caliber. Aorta not opacified. Mild aortic atherosclerotic calcifications. Mediastinum/Nodes: Moderate to large hiatal hernia. No neck base, mediastinal or hilar masses or enlarged lymph nodes. Trachea and esophagus are unremarkable. Lungs/Pleura: Mild interstitial thickening bilaterally, most evident in the lower lungs. Mild subsegmental atelectasis dependently and adjacent to the hiatal hernia. No evidence of pneumonia or pulmonary edema. No lung mass or nodule. No pleural effusion or pneumothorax. Upper Abdomen: No acute findings. Musculoskeletal: Stable wedge-shaped confer a shin deformity of T12. No acute  fractures. No bone lesions. Partly imaged left shoulder prosthesis appears well aligned. No chest wall mass. Review of the MIP images confirms the above findings. IMPRESSION: 1. No evidence of a pulmonary embolism. 2. No acute findings. 3. Coronary artery calcifications and aortic atherosclerosis. 4. Moderate  to large hiatal hernia is stable from the prior CT. Aortic Atherosclerosis (ICD10-I70.0). Electronically Signed   By: Amie Portland M.D.   On: 07/15/2022 12:03   US Venous Img Lower Bilateral (DVT)  Result Date: 07/15/2022 CLINICAL DATA:  Leg swelling. EXAM: BILATERAL LOWER EXTREMITY VENOUS DOPPLER ULTRASOUND TECHNIQUE: Gray-scale sonography with graded compression, as well as color Doppler and duplex ultrasound were performed to evaluate the lower extremity deep venous systems from the level of the common femoral vein and including the common femoral, femoral, profunda femoral, popliteal and calf veins including the posterior tibial, peroneal and gastrocnemius veins when visible. The superficial great saphenous vein was also interrogated. Spectral Doppler was utilized to evaluate flow at rest and with distal augmentation maneuvers in the common femoral, femoral and popliteal veins. COMPARISON:  08/06/2014 FINDINGS: RIGHT LOWER EXTREMITY Common Femoral Vein: No evidence of thrombus. Normal compressibility, respiratory phasicity and response to augmentation. Saphenofemoral Junction: No evidence of thrombus. Normal compressibility and flow on color Doppler imaging. Profunda Femoral Vein: No evidence of thrombus. Normal compressibility and flow on color Doppler imaging. Femoral Vein: No evidence of thrombus. Normal compressibility, respiratory phasicity and response to augmentation. Popliteal Vein: No evidence of thrombus. Normal compressibility, respiratory phasicity and response to augmentation. Calf Veins: Visualized right deep calf veins are patent without thrombus. Limited evaluation. Other Findings:   None. LEFT LOWER EXTREMITY Common Femoral Vein: No evidence of thrombus. Normal compressibility, respiratory phasicity and response to augmentation. Saphenofemoral Junction: No evidence of thrombus. Normal compressibility and flow on color Doppler imaging. Profunda Femoral Vein: No evidence of thrombus. Normal compressibility and flow on color Doppler imaging. Femoral Vein: No evidence of thrombus. Normal compressibility, respiratory phasicity and response to augmentation. Popliteal Vein: No evidence of thrombus. Normal compressibility, respiratory phasicity and response to augmentation. Calf Veins: Visualized left deep calf veins are patent without thrombus. Limited evaluation. Other Findings:  None. IMPRESSION: No evidence of deep venous thrombosis in either lower extremity. Electronically Signed   By: Richarda Overlie M.D.   On: 07/15/2022 11:37   DG Chest Port 1 View  Result Date: 07/15/2022 CLINICAL DATA:  82 year old male with history of shortness of breath. EXAM: PORTABLE CHEST 1 VIEW COMPARISON:  Chest x-ray 02/11/2022. FINDINGS: Lung volumes are normal. No consolidative airspace disease. No pleural effusions. No pneumothorax. No evidence of pulmonary edema. Heart size is mildly enlarged. Upper mediastinal contours are within normal limits. Status post left shoulder arthroplasty. IMPRESSION: 1. No radiographic evidence of acute cardiopulmonary disease. 2. Mild cardiomegaly. Electronically Signed   By: Trudie Reed M.D.   On: 07/15/2022 09:55   CT Lumbar Spine Wo Contrast  Result Date: 07/15/2022 CLINICAL DATA:  82 year old male with low back pain.  Fall. EXAM: CT LUMBAR SPINE WITHOUT CONTRAST TECHNIQUE: Multidetector CT imaging of the lumbar spine was performed without intravenous contrast administration. Multiplanar CT image reconstructions were also generated. RADIATION DOSE REDUCTION: This exam was performed according to the departmental dose-optimization program which includes automated exposure  control, adjustment of the mA and/or kV according to patient size and/or use of iterative reconstruction technique. COMPARISON:  Lumbar radiographs 0302 hours today. CT Abdomen and Pelvis 12/30/2021. lumbar MRI 12/07/2017. FINDINGS: Segmentation: Normal, same numbering system used on 2019 lumbar MRI. Alignment: Chronic S-shaped thoracolumbar scoliosis is stable from last year. Levoconvex apex at L3. Relatively maintained lumbar lordosis. Mild chronic retrolisthesis at L2-L3 and similar anterolisthesis of L5 on S1. Vertebrae: Chronic T12 compression fracture. Chronic L1-L2 interbody ankylosis. Visible lower ribs appear intact. Lumbar levels appear  stable and intact. Visible sacrum and SI joints, pelvis appear grossly stable and intact. No acute osseous abnormality identified. Paraspinal and other soft tissues: Visible noncontrast abdominal viscera are stable from last year, including left nephrolithiasis, Aortoiliac calcified atherosclerosis. Lumbar paraspinal soft tissues are stable, remarkable for paraspinal atrophy. Disc levels: Chronic lower thoracic and lumbar spine degeneration superimposed on chronic L1-L2 ankylosis, and fairly capacious underlying spinal canal. Mild multifactorial lower thoracic spinal stenosis at T10-T11. Borderline to mild multifactorial lumbar spinal stenosis at L2-L3, L4-L5. IMPRESSION: 1. No acute traumatic injury identified in the lumbar spine. Chronic T12 compression fracture, L1-L2 ankylosis. 2. Chronic lower thoracic and lumbar spine degeneration superimposed on S-shaped scoliosis. Mild multifactorial spinal stenosis at T10-T11, L2-L3, L4-L5. 3. Visible abdomen stable from CT Abdomen and Pelvis last year, Aortic Atherosclerosis (ICD10-I70.0). Electronically Signed   By: Odessa Fleming M.D.   On: 07/15/2022 06:04   DG Lumbar Spine Complete  Result Date: 07/15/2022 CLINICAL DATA:  Fall, low back pain EXAM: LUMBAR SPINE - COMPLETE 4+ VIEW COMPARISON:  CT abdomen and pelvis 12/30/2021  FINDINGS: Diffuse osteopenia. There is convex leftward scoliosis centered in the mid lumbar spine and convex rightward scoliosis centered in the lower thoracic spine. Advanced degenerative disc and facet disease. Partial fusion across the L1-2 disc space. Moderate compression fracture at T12 is stable since prior CT. No acute fracture. IMPRESSION: Stable moderate chronic T12 compression fracture. Thoracolumbar scoliosis. Associated advanced degenerative disc and facet disease. Diffuse osteopenia. No acute bony abnormality. Electronically Signed   By: Charlett Nose M.D.   On: 07/15/2022 03:30   CT HEAD WO CONTRAST ( )  Result Date: 07/15/2022 CLINICAL DATA:  Fall EXAM: CT HEAD WITHOUT CONTRAST CT CERVICAL SPINE WITHOUT CONTRAST TECHNIQUE: Multidetector CT imaging of the head and cervical spine was performed following the standard protocol without intravenous contrast. Multiplanar CT image reconstructions of the cervical spine were also generated. RADIATION DOSE REDUCTION: This exam was performed according to the departmental dose-optimization program which includes automated exposure control, adjustment of the mA and/or kV according to patient size and/or use of iterative reconstruction technique. COMPARISON:  08/02/2020 CT head, no prior CT cervical spine available FINDINGS: CT HEAD FINDINGS Brain: No evidence of acute infarct, hemorrhage, mass, mass effect, or midline shift. No hydrocephalus or extra-axial fluid collection. Vascular: No hyperdense vessel. Skull: Negative for fracture or focal lesion. Sinuses/Orbits: Mucosal thickening in the maxillary sinuses and ethmoid air cells. Osseous thickening in the left sphenoid sinus, likely sequela of chronic sinusitis. Status post bilateral lens replacements. Other: The mastoid air cells are well aerated. CT CERVICAL SPINE FINDINGS Alignment: No traumatic listhesis. Levocurvature of the cervical spine. Reversal of the normal cervical lordosis. Skull base and  vertebrae: No acute fracture or suspicious osseous lesion.Osseous fusion across the disc spaces at C4-C7, as well as at T2-T3. Soft tissues and spinal canal: No prevertebral fluid or swelling. No visible canal hematoma. Disc levels: Degenerative changes in the cervical spine. At least mild spinal canal stenosis at C4-C5. Multilevel facet and uncovertebral hypertrophy. Upper chest: No focal pulmonary opacity or pleural effusion. Aortic atherosclerosis. IMPRESSION: 1. No acute intracranial process. 2. No acute fracture or traumatic listhesis in the cervical spine. 3. Aortic atherosclerosis. Aortic Atherosclerosis (ICD10-I70.0). Electronically Signed   By: Wiliam Ke M.D.   On: 07/15/2022 01:19   CT Cervical Spine Wo Contrast  Result Date: 07/15/2022 CLINICAL DATA:  Fall EXAM: CT HEAD WITHOUT CONTRAST CT CERVICAL SPINE WITHOUT CONTRAST TECHNIQUE: Multidetector CT imaging of the head and cervical  spine was performed following the standard protocol without intravenous contrast. Multiplanar CT image reconstructions of the cervical spine were also generated. RADIATION DOSE REDUCTION: This exam was performed according to the departmental dose-optimization program which includes automated exposure control, adjustment of the mA and/or kV according to patient size and/or use of iterative reconstruction technique. COMPARISON:  08/02/2020 CT head, no prior CT cervical spine available FINDINGS: CT HEAD FINDINGS Brain: No evidence of acute infarct, hemorrhage, mass, mass effect, or midline shift. No hydrocephalus or extra-axial fluid collection. Vascular: No hyperdense vessel. Skull: Negative for fracture or focal lesion. Sinuses/Orbits: Mucosal thickening in the maxillary sinuses and ethmoid air cells. Osseous thickening in the left sphenoid sinus, likely sequela of chronic sinusitis. Status post bilateral lens replacements. Other: The mastoid air cells are well aerated. CT CERVICAL SPINE FINDINGS Alignment: No traumatic  listhesis. Levocurvature of the cervical spine. Reversal of the normal cervical lordosis. Skull base and vertebrae: No acute fracture or suspicious osseous lesion.Osseous fusion across the disc spaces at C4-C7, as well as at T2-T3. Soft tissues and spinal canal: No prevertebral fluid or swelling. No visible canal hematoma. Disc levels: Degenerative changes in the cervical spine. At least mild spinal canal stenosis at C4-C5. Multilevel facet and uncovertebral hypertrophy. Upper chest: No focal pulmonary opacity or pleural effusion. Aortic atherosclerosis. IMPRESSION: 1. No acute intracranial process. 2. No acute fracture or traumatic listhesis in the cervical spine. 3. Aortic atherosclerosis. Aortic Atherosclerosis (ICD10-I70.0). Electronically Signed   By: Wiliam Ke M.D.   On: 07/15/2022 01:19    Microbiology: Results for orders placed or performed during the hospital encounter of 07/15/22  SARS Coronavirus 2 by RT PCR (hospital order, performed in [facility] hospital lab) *cepheid single result test* Anterior Nasal Swab     Status: None   Collection Time: 07/15/22  8:40 AM   Specimen: Anterior Nasal Swab  Result Value Ref Range Status   SARS Coronavirus 2 by RT PCR NEGATIVE NEGATIVE Final    Comment: (NOTE) SARS-CoV-2 target nucleic acids are NOT DETECTED.  The SARS-CoV-2 RNA is generally detectable in upper and lower respiratory specimens during the acute phase of infection. The lowest concentration of SARS-CoV-2 viral copies this assay can detect is 250 copies / mL. A negative result does not preclude SARS-CoV-2 infection and should not be used as the sole basis for treatment or other patient management decisions.  A negative result may occur with improper specimen collection / handling, submission of specimen other than nasopharyngeal swab, presence of viral mutation(s) within the areas targeted by this assay, and inadequate number of viral copies (<250 copies / mL). A negative result  must be combined with clinical observations, patient history, and epidemiological information.  Fact Sheet for Patients:   RoadLapTop.co.za  Fact Sheet for Healthcare Providers: http://kim-miller.com/  This test is not yet approved or  cleared by the [place] FDA and has been authorized for detection and/or diagnosis of SARS-CoV-2 by FDA under an Emergency Use Authorization (EUA).  This EUA will remain in effect (meaning this test can be used) for the duration of the COVID-19 declaration under Section 564(b)(1) of the Act, 21 U.S.C. section 360bbb-3(b)(1), unless the authorization is terminated or revoked sooner.  Performed at [facility], [address] [place], [place] [address]     Labs: CBC: Recent Labs  Lab 07/15/22 0024 07/16/22 0403 07/21/22 0353  WBC 5.7 5.8 5.5  NEUTROABS  --   --  2.9  HGB 11.2* 11.1* 11.8*  HCT 36.7*  35.5* 36.9*  MCV 95.8 95.2 93.7  PLT 200 177 195   Basic Metabolic Panel: Recent Labs  Lab 07/16/22 0403 07/17/22 0438 07/18/22 0532 07/19/22 0548 07/21/22 0353  NA 140 135 135 138 137  K 3.9 3.5 3.8 3.5 4.0  CL 101 95* 94* 95* 92*  CO2 32 28 34* 35* 37*  GLUCOSE 98 107* 105* 113* 117*  BUN 24* 22 19 15 20   CREATININE 0.82 0.74 0.75 0.71 0.89  CALCIUM 8.6* 8.7* 8.8* 8.6* 8.7*  MG 2.1  --   --  1.8  --    Liver Function Tests: Recent Labs  Lab 07/15/22 0024  AST 19  ALT 11  ALKPHOS 69  BILITOT 0.7  PROT 6.5  ALBUMIN 3.5     Discharge time spent: greater than 30 minutes.  Signed: Alford Highland, MD [facility] 07/21/2022

## 2022-07-24 ENCOUNTER — Encounter: Payer: Self-pay | Admitting: Student

## 2022-07-24 ENCOUNTER — Non-Acute Institutional Stay (SKILLED_NURSING_FACILITY): Payer: PPO | Admitting: Student

## 2022-07-24 DIAGNOSIS — K5901 Slow transit constipation: Secondary | ICD-10-CM

## 2022-07-24 DIAGNOSIS — N401 Enlarged prostate with lower urinary tract symptoms: Secondary | ICD-10-CM | POA: Diagnosis not present

## 2022-07-24 DIAGNOSIS — I10 Essential (primary) hypertension: Secondary | ICD-10-CM

## 2022-07-24 DIAGNOSIS — S22080K Wedge compression fracture of T11-T12 vertebra, subsequent encounter for fracture with nonunion: Secondary | ICD-10-CM | POA: Diagnosis not present

## 2022-07-24 DIAGNOSIS — I4819 Other persistent atrial fibrillation: Secondary | ICD-10-CM

## 2022-07-24 DIAGNOSIS — Z86718 Personal history of other venous thrombosis and embolism: Secondary | ICD-10-CM | POA: Diagnosis not present

## 2022-07-24 DIAGNOSIS — F1010 Alcohol abuse, uncomplicated: Secondary | ICD-10-CM

## 2022-07-24 DIAGNOSIS — M5135 Other intervertebral disc degeneration, thoracolumbar region: Secondary | ICD-10-CM

## 2022-07-24 NOTE — Progress Notes (Unsigned)
Provider:  Dr. Earnestine Mealing Location:  Other [place].  Nursing Home Room Number: [facility] [address] Place of Service:  SNF (31)  PCP: [name], MD Patient Care Team: [name], MD as PCP - General (Internal Medicine) Jeralyn Ruths, MD as Consulting Physician (Hematology and Oncology)  Extended Emergency Contact Information Primary Emergency Contact: Henard,ANTOINETTE Address: [address]          [place], [place] [address] Home Phone: [phone] Mobile Phone: [phone] Relation: Spouse Secondary Emergency Contact: Bruss,PETER Mobile Phone: [phone] Relation: Son  Code Status:  Goals of Care: Advanced Directive information    07/24/2022    2:20 PM  Advanced Directives  Does Patient Have a Medical Advance Directive? No  Does patient want to make changes to medical advance directive? No - Patient declined      Chief Complaint  Patient presents with   New Admit To SNF    Admission.     HPI: Patient is a 82 y.o. male seen today for admission to  Past Medical History:  Diagnosis Date   Aortic atherosclerosis (HCC)    Atrial fibrillation (HCC)    a.) CHA2DS2VASc = 6 (age x2, HTN, DVT x2, vascular disease history);  b.) rate/rhythm maintained on oral diltiazem + metoprolol succinate; chronic anticoagulation (apixaban) discontinued due to GI bleeding   BPH with obstruction/lower urinary tract symptoms    Bronchitis 01/27/2022   CAD (coronary artery disease)    Cardiomegaly    Chronic diarrhea    Compression fracture of thoracic vertebra (HCC)    a.) CT renal 12/30/2021: chronic appearing T11-T12 (70% height loss)   DDD (degenerative disc disease), cervical    Diverticulosis    DVT (deep venous thrombosis) (HCC) 03/31/2019   a.) anticoagulation intiated (apixaban), however subsequently discontinued due to GI bleeding   ETOH abuse    GI bleed    Heart murmur    Hiatal hernia    Hyperlipidemia    Hypertension    IDA (iron deficiency  anemia)    Low testosterone    Nephrolithiasis    Pneumonia    Rotator cuff arthropathy, left    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    Vasovagal syncope    Past Surgical History:  Procedure Laterality Date   boils removed from right forearm  1968   CARDIAC CATHETERIZATION     CATARACT EXTRACTION Left 2008   catracts Bilateral    left 2008, right 2013   CHOLECYSTECTOMY     COLONOSCOPY WITH PROPOFOL N/A 06/26/2016   Procedure: COLONOSCOPY WITH PROPOFOL;  Surgeon: Scot Jun, MD;  Location: [facility] ENDOSCOPY;  Service: Endoscopy;  Laterality: N/A;   COLONOSCOPY WITH PROPOFOL N/A 07/03/2019   Procedure: COLONOSCOPY WITH PROPOFOL;  Surgeon: Wyline Mood, MD;  Location: [facility] ENDOSCOPY;  Service: Gastroenterology;  Laterality: N/A;   ENDOSCOPIC RETROGRADE CHOLANGIOPANCREATOGRAPHY (ERCP) WITH PROPOFOL     ENDOSCOPIC VEIN LASER TREATMENT     right leg 05/2015, left leg 09/2015   ESOPHAGOGASTRODUODENOSCOPY N/A 04/19/2019   Procedure: ESOPHAGOGASTRODUODENOSCOPY (EGD);  Surgeon: Wyline Mood, MD;  Location: [facility] ENDOSCOPY;  Service: Gastroenterology;  Laterality: N/A;   ESOPHAGOGASTRODUODENOSCOPY (EGD) WITH PROPOFOL N/A 07/03/2019   Procedure: ESOPHAGOGASTRODUODENOSCOPY (EGD) WITH PROPOFOL;  Surgeon: Wyline Mood, MD;  Location: [facility] ENDOSCOPY;  Service: Gastroenterology;  Laterality: N/A;   ESOPHAGOGASTRODUODENOSCOPY (EGD) WITH PROPOFOL N/A 08/07/2019   Procedure: ESOPHAGOGASTRODUODENOSCOPY (EGD) WITH PROPOFOL;  Surgeon: Rachael Fee, MD;  Location: [facility] ENDOSCOPY;  Service: Endoscopy;  Laterality: N/A;  EUS N/A 08/07/2019   Procedure: UPPER ENDOSCOPIC ULTRASOUND (EUS) RADIAL;  Surgeon: Rachael Fee, MD;  Location: [facility] ENDOSCOPY;  Service: Endoscopy;  Laterality: N/A;   eyelid surgery     FOOT SURGERY Right 2009   HEMORROIDECTOMY     HERNIA REPAIR     mrercp  2014   NOSE SURGERY  2011   REVERSE SHOULDER ARTHROPLASTY Left 02/09/2022   Procedure: REVERSE SHOULDER ARTHROPLASTY AND  BICEPS TENODESIS.;  Surgeon: Christena Flake, MD;  Location: [facility] ORS;  Service: Orthopedics;  Laterality: Left;   RHINOPLASTY     squamous cell carcinoma removed from right hand  2013   TONSILLECTOMY     WISDOM TOOTH EXTRACTION      reports that he quit smoking about 35 years ago. His smoking use included cigarettes. He has a 25.00 pack-year smoking history. He has never used smokeless tobacco. He reports current alcohol use of about 14.0 standard drinks of alcohol per week. He reports that he does not use drugs. Social History   Socioeconomic History   Marital status: Married    Spouse name: Not on file   Number of children: Not on file   Years of education: Not on file   Highest education level: Not on file  Occupational History   Not on file  Tobacco Use   Smoking status: Former    Packs/day: 1.00    Years: 25.00    Additional pack years: 0.00    Total pack years: 25.00    Types: Cigarettes    Quit date: 12/05/1986    Years since quitting: 35.6   Smokeless tobacco: Never  Vaping Use   Vaping Use: Never used  Substance and Sexual Activity   Alcohol use: Yes    Alcohol/week: 14.0 standard drinks of alcohol    Types: 14 Cans of beer per week    Comment: 2 beers per dayOR  a couple glasses of wine   Drug use: No   Sexual activity: Not on file  Other Topics Concern   Not on file  Social History Narrative   Not on file   Social Determinants of Health   Financial Resource Strain: Not on file  Food Insecurity: No Food Insecurity (07/15/2022)   Hunger Vital Sign    Worried About Running Out of Food in the Last Year: Never true    Ran Out of Food in the Last Year: Never true  Transportation Needs: No Transportation Needs (07/15/2022)   PRAPARE - Administrator, Civil Service (Medical): No    Lack of Transportation (Non-Medical): No  Physical Activity: Not on file  Stress: Not on file  Social Connections: Not on file  Intimate Partner Violence: Not At Risk  (07/15/2022)   Humiliation, Afraid, Rape, and Kick questionnaire    Fear of Current or Ex-Partner: No    Emotionally Abused: No    Physically Abused: No    Sexually Abused: No    Functional Status Survey:    Family History  Problem Relation Age of Onset   Hypothyroidism Mother    Diabetes Mother    Lung cancer Mother     Health Maintenance  Topic Date Due   DTaP/Tdap/Td (2 - Td or Tdap) 07/13/2021   COVID-19 Vaccine (9 - 2023-24 season) 11/11/2021   Medicare Annual Wellness (AWV)  03/31/2022   INFLUENZA VACCINE  10/12/2022   Pneumonia Vaccine 34+ Years old  Completed   Zoster Vaccines- Shingrix  Completed   HPV VACCINES  Aged Out   COLONOSCOPY (Pts 45-70yrs Insurance coverage will need to be confirmed)  Discontinued    Allergies  Allergen Reactions   Nsaids     Can take Ibuprofen in moderation/high doses or prolonged use causes GI bleed    Outpatient Encounter Medications as of 07/24/2022  Medication Sig   acetaminophen (TYLENOL) 650 MG CR tablet Take 650 mg by mouth every 8 (eight) hours as needed for pain.   cetirizine (ZYRTEC) 10 MG tablet Take 10 mg by mouth every morning.   clindamycin (CLEOCIN T) 1 % external solution Apply 1 % topically as needed. (Apply to head)   diltiazem (CARDIZEM) 30 MG tablet Take 1 tablet (30 mg total) by mouth every 12 (twelve) hours.   feeding supplement (ENSURE ENLIVE / ENSURE PLUS) LIQD Take 237 mLs by mouth 2 (two) times daily between meals.   fluticasone (FLONASE) 50 MCG/ACT nasal spray Place 1 spray into both nostrils daily as needed for allergies or rhinitis.   folic acid (FOLVITE) 1 MG tablet Take 1 tablet (1 mg total) by mouth daily.   furosemide (LASIX) 40 MG tablet Take 40 mg by mouth every morning.   lidocaine (LIDODERM) 5 % Place 1 patch onto the skin daily. Remove & Discard patch within 12 hours or as directed by MD   Multiple Vitamin (MULTIVITAMIN WITH MINERALS) TABS tablet Take 1 tablet by mouth daily.   nystatin cream  (MYCOSTATIN) Apply topically 2 (two) times daily.   oxyCODONE (OXY IR/ROXICODONE) 5 MG immediate release tablet Take 1 tablet (5 mg total) by mouth every 6 (six) hours as needed for up to 8 days for severe pain.   pantoprazole (PROTONIX) 40 MG tablet Take 1 tablet (40 mg total) by mouth daily.   polyethylene glycol (MIRALAX / GLYCOLAX) 17 g packet Take 17 g by mouth daily.   potassium chloride (KLOR-CON) 10 MEQ tablet Take 10 mEq by mouth daily.   sodium chloride (OCEAN) 0.65 % SOLN nasal spray Place 1 spray into both nostrils as needed for congestion.   tamsulosin (FLOMAX) 0.4 MG CAPS capsule Take 1 capsule (0.4 mg total) by mouth daily.   thiamine (VITAMIN B-1) 100 MG tablet Take 1 tablet (100 mg total) by mouth daily.   timolol (TIMOPTIC) 0.5 % ophthalmic solution Place 1 drop into both eyes daily.    [DISCONTINUED] methocarbamol (ROBAXIN) 500 MG tablet Take 1 tablet (500 mg total) by mouth 3 (three) times daily for 10 days.   [DISCONTINUED] polyethylene glycol (MIRALAX / GLYCOLAX) 17 g packet Take 17 g by mouth 2 (two) times daily. (Patient taking differently: Take 17 g by mouth daily.)   No facility-administered encounter medications on file as of 07/24/2022.    Review of Systems  Vitals:   07/24/22 1411  BP: 124/80  Pulse: 79  Resp: 16  Temp: 97.8 F (36.6 C)  SpO2: 93%  Weight: 181 lb 6.4 oz (82.3 kg)  Height: 5\' 6"  (1.676 m)   Body mass index is 29.28 kg/m. Physical Exam  Labs reviewed: Basic Metabolic Panel: Recent Labs    02/12/22 0341 02/13/22 0346 02/14/22 0420 02/27/22 1240 07/16/22 0403 07/17/22 0438 07/18/22 0532 07/19/22 0548 07/21/22 0353  NA 139 138 140   < > 140   < > 135 138 137  K 4.1 3.9 4.4   < > 3.9   < > 3.8 3.5 4.0  CL 108 106 104   < > 101   < > 94* 95* 92*  CO2 26 27  32   < > 32   < > 34* 35* 37*  GLUCOSE 111* 139* 112*   < > 98   < > 105* 113* 117*  BUN 24* 19 13   < > 24*   < > 19 15 20   CREATININE 0.79 0.60* 0.60*   < > 0.82   < > 0.75  0.71 0.89  CALCIUM 8.1* 8.0* 8.2*   < > 8.6*   < > 8.8* 8.6* 8.7*  MG 1.8 2.0 2.0  --  2.1  --   --  1.8  --   PHOS 2.7 1.8* 3.1  --   --   --   --   --   --    < > = values in this interval not displayed.   Liver Function Tests: Recent Labs    02/14/22 0420 02/27/22 1240 07/15/22 0024  AST 44* 20 19  ALT 59* 13 11  ALKPHOS 71 100 69  BILITOT 1.0 0.6 0.7  PROT 5.6* 7.1 6.5  ALBUMIN 2.7* 3.4* 3.5   No results for input(s): "LIPASE", "AMYLASE" in the last 8760 hours. No results for input(s): "AMMONIA" in the last 8760 hours. CBC: Recent Labs    04/05/22 1036 07/03/22 1046 07/15/22 0024 07/16/22 0403 07/21/22 0353  WBC 4.3 5.4 5.7 5.8 5.5  NEUTROABS 2.6 3.7  --   --  2.9  HGB 11.8* 10.5* 11.2* 11.1* 11.8*  HCT 36.9* 33.5* 36.7* 35.5* 36.9*  MCV 88.9 93.6 95.8 95.2 93.7  PLT 142* 198 200 177 195   Cardiac Enzymes: No results for input(s): "CKTOTAL", "CKMB", "CKMBINDEX", "TROPONINI" in the last 8760 hours. BNP: Invalid input(s): "POCBNP" No results found for: "HGBA1C" Lab Results  Component Value Date   TSH 3.338 02/11/2022   Lab Results  Component Value Date   VITAMINB12 337 07/03/2022   Lab Results  Component Value Date   FOLATE 7.0 02/11/2022   Lab Results  Component Value Date   IRON 29 (L) 07/03/2022   TIBC 497 (H) 07/03/2022   FERRITIN 10 (L) 07/03/2022    Imaging and Procedures obtained prior to SNF admission: ECHOCARDIOGRAM COMPLETE  Result Date: 07/18/2022    ECHOCARDIOGRAM REPORT   Patient Name:   Marcus Coleman Date of Exam: 07/17/2022 Medical Rec #:  [id number]      Height:       66.0 in Accession #:    [id number]     Weight:       199.2 lb Date of Birth:  October 12, 1940       BSA:          1.996 m Patient Age:    81 years       BP:           108/84 mmHg Patient Gender: M              HR:           81 bpm. Exam Location:  [facility] Procedure: 2D Echo, Cardiac Doppler and Color Doppler Indications:     CHF---acute diastolic I50.31  History:         Patient has  prior history of Echocardiogram examinations, most                  recent 02/11/2022. Arrythmias:Atrial Fibrillation,                  Signs/Symptoms:Murmur; Risk Factors:Hypertension.  Sonographer:     Cristela Blue Referring Phys:  [id number] Lorretta Harp  Diagnosing Phys: Marcina Millard MD IMPRESSIONS  1. Left ventricular ejection fraction, by estimation, is 55 to 60%. The left ventricle has normal function. The left ventricle has no regional wall motion abnormalities. Indeterminate diastolic filling due to E-A fusion.  2. Right ventricular systolic function is normal. The right ventricular size is normal.  3. Right atrial size was mild to moderately dilated.  4. The mitral valve is normal in structure. Mild to moderate mitral valve regurgitation. No evidence of mitral stenosis.  5. The aortic valve is normal in structure. Aortic valve regurgitation is trivial. No aortic stenosis is present.  6. The inferior vena cava is normal in size with greater than 50% respiratory variability, suggesting right atrial pressure of 3 mmHg. FINDINGS  Left Ventricle: Left ventricular ejection fraction, by estimation, is 55 to 60%. The left ventricle has normal function. The left ventricle has no regional wall motion abnormalities. The left ventricular internal cavity size was normal in size. There is  no left ventricular hypertrophy. Indeterminate diastolic filling due to E-A fusion. Right Ventricle: The right ventricular size is normal. No increase in right ventricular wall thickness. Right ventricular systolic function is normal. Left Atrium: Left atrial size was normal in size. Right Atrium: Right atrial size was mild to moderately dilated. Pericardium: There is no evidence of pericardial effusion. Mitral Valve: The mitral valve is normal in structure. Mild to moderate mitral valve regurgitation. No evidence of mitral valve stenosis. MV peak gradient, 5.3 mmHg. The mean mitral valve gradient is 2.0 mmHg. Tricuspid Valve: The tricuspid  valve is normal in structure. Tricuspid valve regurgitation is mild . No evidence of tricuspid stenosis. Aortic Valve: The aortic valve is normal in structure. Aortic valve regurgitation is trivial. No aortic stenosis is present. Aortic valve mean gradient measures 3.0 mmHg. Aortic valve peak gradient measures 5.4 mmHg. Aortic valve area, by VTI measures 2.47 cm. Pulmonic Valve: The pulmonic valve was normal in structure. Pulmonic valve regurgitation is not visualized. No evidence of pulmonic stenosis. Aorta: The aortic root is normal in size and structure. Venous: The inferior vena cava is normal in size with greater than 50% respiratory variability, suggesting right atrial pressure of 3 mmHg. IAS/Shunts: No atrial level shunt detected by color flow Doppler.  LEFT VENTRICLE PLAX 2D LVIDd:         4.90 cm LVIDs:         3.50 cm LV PW:         1.10 cm LV IVS:        1.10 cm LVOT diam:     2.10 cm LV SV:         47 LV SV Index:   23 LVOT Area:     3.46 cm  RIGHT VENTRICLE RV Basal diam:  4.80 cm RV Mid diam:    3.20 cm RV S prime:     11.60 cm/s TAPSE (M-mode): 1.6 cm LEFT ATRIUM            Index        RIGHT ATRIUM           Index LA diam:      3.60 cm  1.80 cm/m   RA Area:     29.80 cm LA Vol (A2C): 111.0 ml 55.60 ml/m  RA Volume:   105.00 ml 52.59 ml/m LA Vol (A4C): 41.8 ml  20.94 ml/m  AORTIC VALVE AV Area (Vmax):    2.24 cm AV Area (Vmean):   2.43 cm AV Area (VTI):  2.47 cm AV Vmax:           116.00 cm/s AV Vmean:          79.450 cm/s AV VTI:            0.190 m AV Peak Grad:      5.4 mmHg AV Mean Grad:      3.0 mmHg LVOT Vmax:         75.00 cm/s LVOT Vmean:        55.700 cm/s LVOT VTI:          0.135 m LVOT/AV VTI ratio: 0.71  AORTA Ao Root diam: 2.83 cm MITRAL VALVE                TRICUSPID VALVE MV Area (PHT): 5.02 cm     TR Peak grad:   29.8 mmHg MV Area VTI:   1.87 cm     TR Vmax:        273.00 cm/s MV Peak grad:  5.3 mmHg MV Mean grad:  2.0 mmHg     SHUNTS MV Vmax:       1.15 m/s     Systemic  VTI:  0.14 m MV Vmean:      69.6 cm/s    Systemic Diam: 2.10 cm MV Decel Time: 151 msec MV E velocity: 109.00 cm/s Marcina Millard MD Electronically signed by Marcina Millard MD Signature Date/Time: 07/18/2022/8:35:26 AM    Final     Assessment/Plan There are no diagnoses linked to this encounter.   Family/ staff Communication:   Labs/tests ordered:

## 2022-07-27 DIAGNOSIS — J189 Pneumonia, unspecified organism: Secondary | ICD-10-CM | POA: Insufficient documentation

## 2022-08-03 ENCOUNTER — Encounter: Payer: Self-pay | Admitting: Adult Health

## 2022-08-03 ENCOUNTER — Non-Acute Institutional Stay (SKILLED_NURSING_FACILITY): Payer: PPO | Admitting: Adult Health

## 2022-08-03 DIAGNOSIS — Z86718 Personal history of other venous thrombosis and embolism: Secondary | ICD-10-CM | POA: Diagnosis not present

## 2022-08-03 DIAGNOSIS — G8929 Other chronic pain: Secondary | ICD-10-CM

## 2022-08-03 DIAGNOSIS — M545 Low back pain, unspecified: Secondary | ICD-10-CM | POA: Diagnosis not present

## 2022-08-03 DIAGNOSIS — Z8719 Personal history of other diseases of the digestive system: Secondary | ICD-10-CM | POA: Diagnosis not present

## 2022-08-03 DIAGNOSIS — I4819 Other persistent atrial fibrillation: Secondary | ICD-10-CM

## 2022-08-03 DIAGNOSIS — I340 Nonrheumatic mitral (valve) insufficiency: Secondary | ICD-10-CM | POA: Diagnosis not present

## 2022-08-03 DIAGNOSIS — R55 Syncope and collapse: Secondary | ICD-10-CM | POA: Diagnosis not present

## 2022-08-03 DIAGNOSIS — R030 Elevated blood-pressure reading, without diagnosis of hypertension: Secondary | ICD-10-CM | POA: Diagnosis not present

## 2022-08-03 DIAGNOSIS — E7800 Pure hypercholesterolemia, unspecified: Secondary | ICD-10-CM | POA: Diagnosis not present

## 2022-08-03 MED ORDER — OXYCODONE HCL 5 MG PO TABS
5.0000 mg | ORAL_TABLET | Freq: Every evening | ORAL | 0 refills | Status: DC | PRN
Start: 2022-08-03 — End: 2022-11-17

## 2022-08-03 NOTE — Progress Notes (Signed)
Location:  Other [facility]) Nursing Home Room Number: [address] A Place of Service:  SNF [phone]) Provider:  Kenard Gower, DNP, FNP-BC  Patient Care Team: [name], MD as PCP - General (Internal Medicine) Jeralyn Ruths, MD as Consulting Physician (Hematology and Oncology)  Extended Emergency Contact Information Primary Emergency Contact: Galgano,ANTOINETTE Address: [address]          [place], [place] [address] Home Phone: [phone] Mobile Phone: [phone] Relation: Spouse Secondary Emergency Contact: Blizzard,PETER Mobile Phone: [phone] Relation: Son  Code Status: Full Code  Goals of care: Advanced Directive information    08/03/2022    9:52 AM  Advanced Directives  Does Patient Have a Medical Advance Directive? No  Does patient want to make changes to medical advance directive? No - Patient declined     Chief Complaint  Patient presents with   Acute Visit    Pain management    HPI:  Pt is a 82 y.o. male seen today for an acute visit regarding his low back pain. He is a short-term rehabilitation resident of [facility]. He was hospitalized 07/15/22 to 07/21/22 for acute back pain. CT of lumbar spine showed a chronic T12 compression fracture, S-shaped scoliosis and multi-level DDD and spinal stenosis.   Past Medical History:  Diagnosis Date   Aortic atherosclerosis (HCC)    Atrial fibrillation (HCC)    a.) CHA2DS2VASc = 6 (age x2, HTN, DVT x2, vascular disease history);  b.) rate/rhythm maintained on oral diltiazem + metoprolol succinate; chronic anticoagulation (apixaban) discontinued due to GI bleeding   BPH with obstruction/lower urinary tract symptoms    Bronchitis 01/27/2022   CAD (coronary artery disease)    Cardiomegaly    Chronic diarrhea    Compression fracture of thoracic vertebra (HCC)    a.) CT renal 12/30/2021: chronic appearing T11-T12 (70% height loss)   DDD (degenerative disc disease), cervical    Diverticulosis     DVT (deep venous thrombosis) (HCC) 03/31/2019   a.) anticoagulation intiated (apixaban), however subsequently discontinued due to GI bleeding   ETOH abuse    GI bleed    Heart murmur    Hiatal hernia    Hyperlipidemia    Hypertension    IDA (iron deficiency anemia)    Low testosterone    Nephrolithiasis    Pneumonia    Rotator cuff arthropathy, left    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    Vasovagal syncope    Past Surgical History:  Procedure Laterality Date   boils removed from right forearm  1968   CARDIAC CATHETERIZATION     CATARACT EXTRACTION Left 2008   catracts Bilateral    left 2008, right 2013   CHOLECYSTECTOMY     COLONOSCOPY WITH PROPOFOL N/A 06/26/2016   Procedure: COLONOSCOPY WITH PROPOFOL;  Surgeon: Scot Jun, MD;  Location: [facility] ENDOSCOPY;  Service: Endoscopy;  Laterality: N/A;   COLONOSCOPY WITH PROPOFOL N/A 07/03/2019   Procedure: COLONOSCOPY WITH PROPOFOL;  Surgeon: Wyline Mood, MD;  Location: [facility] ENDOSCOPY;  Service: Gastroenterology;  Laterality: N/A;   ENDOSCOPIC RETROGRADE CHOLANGIOPANCREATOGRAPHY (ERCP) WITH PROPOFOL     ENDOSCOPIC VEIN LASER TREATMENT     right leg 05/2015, left leg 09/2015   ESOPHAGOGASTRODUODENOSCOPY N/A 04/19/2019   Procedure: ESOPHAGOGASTRODUODENOSCOPY (EGD);  Surgeon: Wyline Mood, MD;  Location: [facility] ENDOSCOPY;  Service: Gastroenterology;  Laterality: N/A;   ESOPHAGOGASTRODUODENOSCOPY (EGD) WITH PROPOFOL N/A 07/03/2019   Procedure: ESOPHAGOGASTRODUODENOSCOPY (EGD) WITH PROPOFOL;  Surgeon: Wyline Mood, MD;  Location: [facility] ENDOSCOPY;  Service: Gastroenterology;  Laterality: N/A;   ESOPHAGOGASTRODUODENOSCOPY (EGD) WITH PROPOFOL N/A 08/07/2019   Procedure: ESOPHAGOGASTRODUODENOSCOPY (EGD) WITH PROPOFOL;  Surgeon: Rachael Fee, MD;  Location: [facility] ENDOSCOPY;  Service: Endoscopy;  Laterality: N/A;   EUS N/A 08/07/2019   Procedure: UPPER ENDOSCOPIC ULTRASOUND (EUS) RADIAL;  Surgeon: Rachael Fee, MD;  Location: [facility]  ENDOSCOPY;  Service: Endoscopy;  Laterality: N/A;   eyelid surgery     FOOT SURGERY Right 2009   HEMORROIDECTOMY     HERNIA REPAIR     mrercp  2014   NOSE SURGERY  2011   REVERSE SHOULDER ARTHROPLASTY Left 02/09/2022   Procedure: REVERSE SHOULDER ARTHROPLASTY AND BICEPS TENODESIS.;  Surgeon: Christena Flake, MD;  Location: [facility] ORS;  Service: Orthopedics;  Laterality: Left;   RHINOPLASTY     squamous cell carcinoma removed from right hand  2013   TONSILLECTOMY     WISDOM TOOTH EXTRACTION      Allergies  Allergen Reactions   Nsaids     Can take Ibuprofen in moderation/high doses or prolonged use causes GI bleed    Outpatient Encounter Medications as of 08/03/2022  Medication Sig   acetaminophen (TYLENOL) 650 MG CR tablet Take 650 mg by mouth every 8 (eight) hours as needed for pain.   cetirizine (ZYRTEC) 10 MG tablet Take 10 mg by mouth every morning.   clindamycin (CLEOCIN T) 1 % external solution Apply 1 % topically as needed. (Apply to head)   diltiazem (CARDIZEM) 30 MG tablet Take 1 tablet (30 mg total) by mouth every 12 (twelve) hours.   feeding supplement (ENSURE ENLIVE / ENSURE PLUS) LIQD Take 237 mLs by mouth 2 (two) times daily between meals.   fluticasone (FLONASE) 50 MCG/ACT nasal spray Place 1 spray into both nostrils daily as needed for allergies or rhinitis.   folic acid (FOLVITE) 1 MG tablet Take 1 tablet (1 mg total) by mouth daily.   furosemide (LASIX) 40 MG tablet Take 40 mg by mouth every morning.   lidocaine (LIDODERM) 5 % Place 1 patch onto the skin daily. Remove & Discard patch within 12 hours or as directed by MD   Multiple Vitamin (MULTIVITAMIN WITH MINERALS) TABS tablet Take 1 tablet by mouth daily.   nystatin cream (MYCOSTATIN) Apply topically 2 (two) times daily.   pantoprazole (PROTONIX) 40 MG tablet Take 1 tablet (40 mg total) by mouth daily.   polyethylene glycol (MIRALAX / GLYCOLAX) 17 g packet Take 17 g by mouth daily.   potassium chloride (KLOR-CON) 10  MEQ tablet Take 10 mEq by mouth daily.   sodium chloride (OCEAN) 0.65 % SOLN nasal spray Place 1 spray into both nostrils as needed for congestion.   tamsulosin (FLOMAX) 0.4 MG CAPS capsule Take 1 capsule (0.4 mg total) by mouth daily.   thiamine (VITAMIN B-1) 100 MG tablet Take 1 tablet (100 mg total) by mouth daily.   timolol (TIMOPTIC) 0.5 % ophthalmic solution Place 1 drop into both eyes daily.    No facility-administered encounter medications on file as of 08/03/2022.    Review of Systems  Constitutional:  Negative for activity change, appetite change and fever.  HENT:  Negative for sore throat.   Eyes: Negative.   Cardiovascular:  Negative for chest pain and leg swelling.  Gastrointestinal:  Negative for abdominal distention, diarrhea and vomiting.  Genitourinary:  Negative for dysuria, frequency and urgency.  Musculoskeletal:  Positive for back pain.  Skin:  Negative for color change.  Neurological:  Negative for dizziness and headaches.  Psychiatric/Behavioral:  Negative for behavioral problems and sleep disturbance. The patient is not nervous/anxious.      Immunization History  Administered Date(s) Administered   Fluad Quad(high Dose 65+) 11/21/2018   Influenza Split 01/24/2013, 12/15/2014   Influenza, Seasonal, Injecte, Preservative Fre 12/06/2011   Influenza,inj,Quad PF,6+ Mos 11/19/2017, 12/09/2019   Influenza-Unspecified 12/26/2010, 01/24/2013, 12/18/2013, 11/12/2014, 11/12/2015, 12/09/2015, 12/08/2016, 11/21/2018   PFIZER Comirnaty(Gray Top)Covid-19 Tri-Sucrose Vaccine 03/18/2019, 04/08/2019, 12/15/2019   PFIZER(Purple Top)SARS-COV-2 Vaccination 03/18/2019, 04/08/2019, 12/15/2019, 01/15/2020, 06/22/2020   PPD Test 05/28/2020   Pneumococcal Conjugate-13 09/29/2015   Pneumococcal Polysaccharide-23 03/14/2011, 03/28/2011   Tdap 07/14/2011   Zoster Recombinat (Shingrix) 08/11/2017, 11/19/2017, 10/25/2020   Zoster, Live 12/12/2011   Pertinent  Health Maintenance Due   Topic Date Due   INFLUENZA VACCINE  10/12/2022   COLONOSCOPY (Pts 45-3yrs Insurance coverage will need to be confirmed)  Discontinued      02/13/2022   10:00 AM 02/13/2022    8:10 PM 02/14/2022   12:00 PM 02/27/2022    1:21 PM 03/02/2022   11:31 AM  Fall Risk  (RETIRED) Patient Fall Risk Level High fall risk High fall risk High fall risk High fall risk High fall risk     Vitals:   08/03/22 0946  BP: 137/87  Pulse: 93  Resp: 16  Temp: 97.7 F (36.5 C)  SpO2: 92%  Weight: 178 lb 3.2 oz (80.8 kg)  Height: 5\' 6"  (1.676 m)   Body mass index is 28.76 kg/m.  Physical Exam Constitutional:      General: He is not in acute distress.    Appearance: Normal appearance.  HENT:     Head: Normocephalic and atraumatic.     Mouth/Throat:     Mouth: Mucous membranes are moist.  Eyes:     Conjunctiva/sclera: Conjunctivae normal.  Cardiovascular:     Rate and Rhythm: Normal rate. Rhythm irregular.     Pulses: Normal pulses.     Heart sounds: Normal heart sounds.  Pulmonary:     Effort: Pulmonary effort is normal.     Breath sounds: Normal breath sounds.  Abdominal:     General: Bowel sounds are normal.     Palpations: Abdomen is soft.  Musculoskeletal:        General: No swelling. Normal range of motion.     Cervical back: Normal range of motion.  Skin:    General: Skin is warm and dry.  Neurological:     General: No focal deficit present.     Mental Status: He is alert and oriented to person, place, and time.  Psychiatric:        Mood and Affect: Mood normal.        Behavior: Behavior normal.        Thought Content: Thought content normal.        Judgment: Judgment normal.        Labs reviewed: Recent Labs    02/12/22 0341 02/13/22 0346 02/14/22 0420 02/27/22 1240 07/16/22 0403 07/17/22 0438 07/18/22 0532 07/19/22 0548 07/21/22 0353  NA 139 138 140   < > 140   < > 135 138 137  K 4.1 3.9 4.4   < > 3.9   < > 3.8 3.5 4.0  CL 108 106 104   < > 101   < > 94*  95* 92*  CO2 26 27 32   < > 32   < > 34* 35* 37*  GLUCOSE 111* 139* 112*   < >  98   < > 105* 113* 117*  BUN 24* 19 13   < > 24*   < > 19 15 20   CREATININE 0.79 0.60* 0.60*   < > 0.82   < > 0.75 0.71 0.89  CALCIUM 8.1* 8.0* 8.2*   < > 8.6*   < > 8.8* 8.6* 8.7*  MG 1.8 2.0 2.0  --  2.1  --   --  1.8  --   PHOS 2.7 1.8* 3.1  --   --   --   --   --   --    < > = values in this interval not displayed.   Recent Labs    02/14/22 0420 02/27/22 1240 07/15/22 0024  AST 44* 20 19  ALT 59* 13 11  ALKPHOS 71 100 69  BILITOT 1.0 0.6 0.7  PROT 5.6* 7.1 6.5  ALBUMIN 2.7* 3.4* 3.5   Recent Labs    04/05/22 1036 07/03/22 1046 07/15/22 0024 07/16/22 0403 07/21/22 0353  WBC 4.3 5.4 5.7 5.8 5.5  NEUTROABS 2.6 3.7  --   --  2.9  HGB 11.8* 10.5* 11.2* 11.1* 11.8*  HCT 36.9* 33.5* 36.7* 35.5* 36.9*  MCV 88.9 93.6 95.8 95.2 93.7  PLT 142* 198 200 177 195   Lab Results  Component Value Date   TSH 3.338 02/11/2022   No results found for: "HGBA1C" No results found for: "CHOL", "HDL", "LDLCALC", "LDLDIRECT", "TRIG", "CHOLHDL"  Significant Diagnostic Results in last 30 days:  MR THORACIC SPINE WO CONTRAST  Result Date: 07/20/2022 CLINICAL DATA:  Fall with back pain EXAM: MRI THORACIC SPINE WITHOUT CONTRAST TECHNIQUE: Multiplanar, multisequence MR imaging of the thoracic spine was performed. No intravenous contrast was administered. COMPARISON:  None Available. FINDINGS: Alignment:  Physiologic. Vertebrae: Diffusely low T1/T2-weighted signal intensity throughout the spinal column. Chronic compression deformity of T12. Inferior T11 endplate Modic type 2 signal change. Cord:  Normal signal and morphology. Paraspinal and other soft tissues: Negative. Disc levels: There is mild multilevel degenerative disc disease without spinal canal or neural foraminal stenosis. IMPRESSION: 1. No acute abnormality of the thoracic spine. 2. Diffusely low T1/T2-weighted signal intensity throughout the spinal column, which  is nonspecific and can be seen in the setting of anemia, smoking, obesity or other marrow infiltrative processes. 3. Chronic compression deformity of T12. 4. Mild multilevel degenerative disc disease without spinal canal or neural foraminal stenosis. Electronically Signed   By: Deatra Robinson M.D.   On: 07/20/2022 02:33   ECHOCARDIOGRAM COMPLETE  Result Date: 07/18/2022    ECHOCARDIOGRAM REPORT   Patient Name:   Marcus Coleman Date of Exam: 07/17/2022 Medical Rec #:  [id number]      Height:       66.0 in Accession #:    [id number]     Weight:       199.2 lb Date of Birth:  Jan 28, 1941       BSA:          1.996 m Patient Age:    81 years       BP:           108/84 mmHg Patient Gender: M              HR:           81 bpm. Exam Location:  [facility] Procedure: 2D Echo, Cardiac Doppler and Color Doppler Indications:     CHF---acute diastolic I50.31  History:         Patient has  prior history of Echocardiogram examinations, most                  recent 02/11/2022. Arrythmias:Atrial Fibrillation,                  Signs/Symptoms:Murmur; Risk Factors:Hypertension.  Sonographer:     Cristela Blue Referring Phys:  [id number] Brien Few NIU Diagnosing Phys: Marcina Millard MD IMPRESSIONS  1. Left ventricular ejection fraction, by estimation, is 55 to 60%. The left ventricle has normal function. The left ventricle has no regional wall motion abnormalities. Indeterminate diastolic filling due to E-A fusion.  2. Right ventricular systolic function is normal. The right ventricular size is normal.  3. Right atrial size was mild to moderately dilated.  4. The mitral valve is normal in structure. Mild to moderate mitral valve regurgitation. No evidence of mitral stenosis.  5. The aortic valve is normal in structure. Aortic valve regurgitation is trivial. No aortic stenosis is present.  6. The inferior vena cava is normal in size with greater than 50% respiratory variability, suggesting right atrial pressure of 3 mmHg. FINDINGS  Left Ventricle: Left  ventricular ejection fraction, by estimation, is 55 to 60%. The left ventricle has normal function. The left ventricle has no regional wall motion abnormalities. The left ventricular internal cavity size was normal in size. There is  no left ventricular hypertrophy. Indeterminate diastolic filling due to E-A fusion. Right Ventricle: The right ventricular size is normal. No increase in right ventricular wall thickness. Right ventricular systolic function is normal. Left Atrium: Left atrial size was normal in size. Right Atrium: Right atrial size was mild to moderately dilated. Pericardium: There is no evidence of pericardial effusion. Mitral Valve: The mitral valve is normal in structure. Mild to moderate mitral valve regurgitation. No evidence of mitral valve stenosis. MV peak gradient, 5.3 mmHg. The mean mitral valve gradient is 2.0 mmHg. Tricuspid Valve: The tricuspid valve is normal in structure. Tricuspid valve regurgitation is mild . No evidence of tricuspid stenosis. Aortic Valve: The aortic valve is normal in structure. Aortic valve regurgitation is trivial. No aortic stenosis is present. Aortic valve mean gradient measures 3.0 mmHg. Aortic valve peak gradient measures 5.4 mmHg. Aortic valve area, by VTI measures 2.47 cm. Pulmonic Valve: The pulmonic valve was normal in structure. Pulmonic valve regurgitation is not visualized. No evidence of pulmonic stenosis. Aorta: The aortic root is normal in size and structure. Venous: The inferior vena cava is normal in size with greater than 50% respiratory variability, suggesting right atrial pressure of 3 mmHg. IAS/Shunts: No atrial level shunt detected by color flow Doppler.  LEFT VENTRICLE PLAX 2D LVIDd:         4.90 cm LVIDs:         3.50 cm LV PW:         1.10 cm LV IVS:        1.10 cm LVOT diam:     2.10 cm LV SV:         47 LV SV Index:   23 LVOT Area:     3.46 cm  RIGHT VENTRICLE RV Basal diam:  4.80 cm RV Mid diam:    3.20 cm RV S prime:     11.60 cm/s  TAPSE (M-mode): 1.6 cm LEFT ATRIUM            Index        RIGHT ATRIUM           Index LA diam:  3.60 cm  1.80 cm/m   RA Area:     29.80 cm LA Vol (A2C): 111.0 ml 55.60 ml/m  RA Volume:   105.00 ml 52.59 ml/m LA Vol (A4C): 41.8 ml  20.94 ml/m  AORTIC VALVE AV Area (Vmax):    2.24 cm AV Area (Vmean):   2.43 cm AV Area (VTI):     2.47 cm AV Vmax:           116.00 cm/s AV Vmean:          79.450 cm/s AV VTI:            0.190 m AV Peak Grad:      5.4 mmHg AV Mean Grad:      3.0 mmHg LVOT Vmax:         75.00 cm/s LVOT Vmean:        55.700 cm/s LVOT VTI:          0.135 m LVOT/AV VTI ratio: 0.71  AORTA Ao Root diam: 2.83 cm MITRAL VALVE                TRICUSPID VALVE MV Area (PHT): 5.02 cm     TR Peak grad:   29.8 mmHg MV Area VTI:   1.87 cm     TR Vmax:        273.00 cm/s MV Peak grad:  5.3 mmHg MV Mean grad:  2.0 mmHg     SHUNTS MV Vmax:       1.15 m/s     Systemic VTI:  0.14 m MV Vmean:      69.6 cm/s    Systemic Diam: 2.10 cm MV Decel Time: 151 msec MV E velocity: 109.00 cm/s Marcina Millard MD Electronically signed by Marcina Millard MD Signature Date/Time: 07/18/2022/8:35:26 AM    Final    CT Angio Chest Pulmonary Embolism (PE) W or WO Contrast  Result Date: 07/15/2022 CLINICAL DATA:  Clinical suspicion for pulmonary embolism. Shortness of breath. EXAM: CT ANGIOGRAPHY CHEST WITH CONTRAST TECHNIQUE: Multidetector CT imaging of the chest was performed using the standard protocol during bolus administration of intravenous contrast. Multiplanar CT image reconstructions and MIPs were obtained to evaluate the vascular anatomy. RADIATION DOSE REDUCTION: This exam was performed according to the departmental dose-optimization program which includes automated exposure control, adjustment of the mA and/or kV according to patient size and/or use of iterative reconstruction technique. CONTRAST:  75mL OMNIPAQUE IOHEXOL 350 MG/ML SOLN COMPARISON:  Current and prior chest radiographs. Prior chest CTA,  02/10/2022. FINDINGS: Cardiovascular: Pulmonary arteries are well opacified. There is no evidence of a pulmonary embolism. Heart normal in size. Three-vessel coronary artery calcifications. No pericardial effusion. Great vessels are normal caliber. Aorta not opacified. Mild aortic atherosclerotic calcifications. Mediastinum/Nodes: Moderate to large hiatal hernia. No neck base, mediastinal or hilar masses or enlarged lymph nodes. Trachea and esophagus are unremarkable. Lungs/Pleura: Mild interstitial thickening bilaterally, most evident in the lower lungs. Mild subsegmental atelectasis dependently and adjacent to the hiatal hernia. No evidence of pneumonia or pulmonary edema. No lung mass or nodule. No pleural effusion or pneumothorax. Upper Abdomen: No acute findings. Musculoskeletal: Stable wedge-shaped confer a shin deformity of T12. No acute fractures. No bone lesions. Partly imaged left shoulder prosthesis appears well aligned. No chest wall mass. Review of the MIP images confirms the above findings. IMPRESSION: 1. No evidence of a pulmonary embolism. 2. No acute findings. 3. Coronary artery calcifications and aortic atherosclerosis. 4. Moderate to large hiatal hernia is stable from the prior  CT. Aortic Atherosclerosis (ICD10-I70.0). Electronically Signed   By: Amie Portland M.D.   On: 07/15/2022 12:03   US Venous Img Lower Bilateral (DVT)  Result Date: 07/15/2022 CLINICAL DATA:  Leg swelling. EXAM: BILATERAL LOWER EXTREMITY VENOUS DOPPLER ULTRASOUND TECHNIQUE: Gray-scale sonography with graded compression, as well as color Doppler and duplex ultrasound were performed to evaluate the lower extremity deep venous systems from the level of the common femoral vein and including the common femoral, femoral, profunda femoral, popliteal and calf veins including the posterior tibial, peroneal and gastrocnemius veins when visible. The superficial great saphenous vein was also interrogated. Spectral Doppler was  utilized to evaluate flow at rest and with distal augmentation maneuvers in the common femoral, femoral and popliteal veins. COMPARISON:  08/06/2014 FINDINGS: RIGHT LOWER EXTREMITY Common Femoral Vein: No evidence of thrombus. Normal compressibility, respiratory phasicity and response to augmentation. Saphenofemoral Junction: No evidence of thrombus. Normal compressibility and flow on color Doppler imaging. Profunda Femoral Vein: No evidence of thrombus. Normal compressibility and flow on color Doppler imaging. Femoral Vein: No evidence of thrombus. Normal compressibility, respiratory phasicity and response to augmentation. Popliteal Vein: No evidence of thrombus. Normal compressibility, respiratory phasicity and response to augmentation. Calf Veins: Visualized right deep calf veins are patent without thrombus. Limited evaluation. Other Findings:  None. LEFT LOWER EXTREMITY Common Femoral Vein: No evidence of thrombus. Normal compressibility, respiratory phasicity and response to augmentation. Saphenofemoral Junction: No evidence of thrombus. Normal compressibility and flow on color Doppler imaging. Profunda Femoral Vein: No evidence of thrombus. Normal compressibility and flow on color Doppler imaging. Femoral Vein: No evidence of thrombus. Normal compressibility, respiratory phasicity and response to augmentation. Popliteal Vein: No evidence of thrombus. Normal compressibility, respiratory phasicity and response to augmentation. Calf Veins: Visualized left deep calf veins are patent without thrombus. Limited evaluation. Other Findings:  None. IMPRESSION: No evidence of deep venous thrombosis in either lower extremity. Electronically Signed   By: Richarda Overlie M.D.   On: 07/15/2022 11:37   DG Chest Port 1 View  Result Date: 07/15/2022 CLINICAL DATA:  82 year old male with history of shortness of breath. EXAM: PORTABLE CHEST 1 VIEW COMPARISON:  Chest x-ray 02/11/2022. FINDINGS: Lung volumes are normal. No  consolidative airspace disease. No pleural effusions. No pneumothorax. No evidence of pulmonary edema. Heart size is mildly enlarged. Upper mediastinal contours are within normal limits. Status post left shoulder arthroplasty. IMPRESSION: 1. No radiographic evidence of acute cardiopulmonary disease. 2. Mild cardiomegaly. Electronically Signed   By: Trudie Reed M.D.   On: 07/15/2022 09:55   CT Lumbar Spine Wo Contrast  Result Date: 07/15/2022 CLINICAL DATA:  82 year old male with low back pain.  Fall. EXAM: CT LUMBAR SPINE WITHOUT CONTRAST TECHNIQUE: Multidetector CT imaging of the lumbar spine was performed without intravenous contrast administration. Multiplanar CT image reconstructions were also generated. RADIATION DOSE REDUCTION: This exam was performed according to the departmental dose-optimization program which includes automated exposure control, adjustment of the mA and/or kV according to patient size and/or use of iterative reconstruction technique. COMPARISON:  Lumbar radiographs 0302 hours today. CT Abdomen and Pelvis 12/30/2021. lumbar MRI 12/07/2017. FINDINGS: Segmentation: Normal, same numbering system used on 2019 lumbar MRI. Alignment: Chronic S-shaped thoracolumbar scoliosis is stable from last year. Levoconvex apex at L3. Relatively maintained lumbar lordosis. Mild chronic retrolisthesis at L2-L3 and similar anterolisthesis of L5 on S1. Vertebrae: Chronic T12 compression fracture. Chronic L1-L2 interbody ankylosis. Visible lower ribs appear intact. Lumbar levels appear stable and intact. Visible sacrum and SI joints, pelvis  appear grossly stable and intact. No acute osseous abnormality identified. Paraspinal and other soft tissues: Visible noncontrast abdominal viscera are stable from last year, including left nephrolithiasis, Aortoiliac calcified atherosclerosis. Lumbar paraspinal soft tissues are stable, remarkable for paraspinal atrophy. Disc levels: Chronic lower thoracic and lumbar  spine degeneration superimposed on chronic L1-L2 ankylosis, and fairly capacious underlying spinal canal. Mild multifactorial lower thoracic spinal stenosis at T10-T11. Borderline to mild multifactorial lumbar spinal stenosis at L2-L3, L4-L5. IMPRESSION: 1. No acute traumatic injury identified in the lumbar spine. Chronic T12 compression fracture, L1-L2 ankylosis. 2. Chronic lower thoracic and lumbar spine degeneration superimposed on S-shaped scoliosis. Mild multifactorial spinal stenosis at T10-T11, L2-L3, L4-L5. 3. Visible abdomen stable from CT Abdomen and Pelvis last year, Aortic Atherosclerosis (ICD10-I70.0). Electronically Signed   By: Odessa Fleming M.D.   On: 07/15/2022 06:04   DG Lumbar Spine Complete  Result Date: 07/15/2022 CLINICAL DATA:  Fall, low back pain EXAM: LUMBAR SPINE - COMPLETE 4+ VIEW COMPARISON:  CT abdomen and pelvis 12/30/2021 FINDINGS: Diffuse osteopenia. There is convex leftward scoliosis centered in the mid lumbar spine and convex rightward scoliosis centered in the lower thoracic spine. Advanced degenerative disc and facet disease. Partial fusion across the L1-2 disc space. Moderate compression fracture at T12 is stable since prior CT. No acute fracture. IMPRESSION: Stable moderate chronic T12 compression fracture. Thoracolumbar scoliosis. Associated advanced degenerative disc and facet disease. Diffuse osteopenia. No acute bony abnormality. Electronically Signed   By: Charlett Nose M.D.   On: 07/15/2022 03:30   CT HEAD WO CONTRAST ( )  Result Date: 07/15/2022 CLINICAL DATA:  Fall EXAM: CT HEAD WITHOUT CONTRAST CT CERVICAL SPINE WITHOUT CONTRAST TECHNIQUE: Multidetector CT imaging of the head and cervical spine was performed following the standard protocol without intravenous contrast. Multiplanar CT image reconstructions of the cervical spine were also generated. RADIATION DOSE REDUCTION: This exam was performed according to the departmental dose-optimization program which includes  automated exposure control, adjustment of the mA and/or kV according to patient size and/or use of iterative reconstruction technique. COMPARISON:  08/02/2020 CT head, no prior CT cervical spine available FINDINGS: CT HEAD FINDINGS Brain: No evidence of acute infarct, hemorrhage, mass, mass effect, or midline shift. No hydrocephalus or extra-axial fluid collection. Vascular: No hyperdense vessel. Skull: Negative for fracture or focal lesion. Sinuses/Orbits: Mucosal thickening in the maxillary sinuses and ethmoid air cells. Osseous thickening in the left sphenoid sinus, likely sequela of chronic sinusitis. Status post bilateral lens replacements. Other: The mastoid air cells are well aerated. CT CERVICAL SPINE FINDINGS Alignment: No traumatic listhesis. Levocurvature of the cervical spine. Reversal of the normal cervical lordosis. Skull base and vertebrae: No acute fracture or suspicious osseous lesion.Osseous fusion across the disc spaces at C4-C7, as well as at T2-T3. Soft tissues and spinal canal: No prevertebral fluid or swelling. No visible canal hematoma. Disc levels: Degenerative changes in the cervical spine. At least mild spinal canal stenosis at C4-C5. Multilevel facet and uncovertebral hypertrophy. Upper chest: No focal pulmonary opacity or pleural effusion. Aortic atherosclerosis. IMPRESSION: 1. No acute intracranial process. 2. No acute fracture or traumatic listhesis in the cervical spine. 3. Aortic atherosclerosis. Aortic Atherosclerosis (ICD10-I70.0). Electronically Signed   By: Wiliam Ke M.D.   On: 07/15/2022 01:19   CT Cervical Spine Wo Contrast  Result Date: 07/15/2022 CLINICAL DATA:  Fall EXAM: CT HEAD WITHOUT CONTRAST CT CERVICAL SPINE WITHOUT CONTRAST TECHNIQUE: Multidetector CT imaging of the head and cervical spine was performed following the standard protocol without intravenous  contrast. Multiplanar CT image reconstructions of the cervical spine were also generated. RADIATION DOSE  REDUCTION: This exam was performed according to the departmental dose-optimization program which includes automated exposure control, adjustment of the mA and/or kV according to patient size and/or use of iterative reconstruction technique. COMPARISON:  08/02/2020 CT head, no prior CT cervical spine available FINDINGS: CT HEAD FINDINGS Brain: No evidence of acute infarct, hemorrhage, mass, mass effect, or midline shift. No hydrocephalus or extra-axial fluid collection. Vascular: No hyperdense vessel. Skull: Negative for fracture or focal lesion. Sinuses/Orbits: Mucosal thickening in the maxillary sinuses and ethmoid air cells. Osseous thickening in the left sphenoid sinus, likely sequela of chronic sinusitis. Status post bilateral lens replacements. Other: The mastoid air cells are well aerated. CT CERVICAL SPINE FINDINGS Alignment: No traumatic listhesis. Levocurvature of the cervical spine. Reversal of the normal cervical lordosis. Skull base and vertebrae: No acute fracture or suspicious osseous lesion.Osseous fusion across the disc spaces at C4-C7, as well as at T2-T3. Soft tissues and spinal canal: No prevertebral fluid or swelling. No visible canal hematoma. Disc levels: Degenerative changes in the cervical spine. At least mild spinal canal stenosis at C4-C5. Multilevel facet and uncovertebral hypertrophy. Upper chest: No focal pulmonary opacity or pleural effusion. Aortic atherosclerosis. IMPRESSION: 1. No acute intracranial process. 2. No acute fracture or traumatic listhesis in the cervical spine. 3. Aortic atherosclerosis. Aortic Atherosclerosis (ICD10-I70.0). Electronically Signed   By: Wiliam Ke M.D.   On: 07/15/2022 01:19    Assessment/Plan  1. Chronic bilateral low back pain without sciatica -  continue ice pack to lower back BID - oxyCODONE (ROXICODONE) 5 MG immediate release tablet; Take 1 tablet (5 mg total) by mouth at bedtime as needed for severe pain.  Dispense: 30 tablet; Refill: 0 -   continue PT for therapeutic strengthening exercises  2. Persistent atrial fibrillation (HCC) -  rate-controlled -  continue Eliquis    Family/ staff Communication: Discussed plan of care with resident and charge nurse  Labs/tests ordered:  None    Kenard Gower, DNP, MSN, FNP-BC [facility] [phone] (Monday-Friday 8:00 a.m. - 5:00 p.m.) [phone] (after hours)

## 2022-08-14 DIAGNOSIS — Z96612 Presence of left artificial shoulder joint: Secondary | ICD-10-CM | POA: Diagnosis not present

## 2022-08-14 DIAGNOSIS — M5136 Other intervertebral disc degeneration, lumbar region: Secondary | ICD-10-CM | POA: Diagnosis not present

## 2022-08-14 DIAGNOSIS — M12812 Other specific arthropathies, not elsewhere classified, left shoulder: Secondary | ICD-10-CM | POA: Diagnosis not present

## 2022-08-16 DIAGNOSIS — M5135 Other intervertebral disc degeneration, thoracolumbar region: Secondary | ICD-10-CM | POA: Diagnosis not present

## 2022-08-16 DIAGNOSIS — I4819 Other persistent atrial fibrillation: Secondary | ICD-10-CM | POA: Diagnosis not present

## 2022-08-16 DIAGNOSIS — Z09 Encounter for follow-up examination after completed treatment for conditions other than malignant neoplasm: Secondary | ICD-10-CM | POA: Diagnosis not present

## 2022-08-16 DIAGNOSIS — M549 Dorsalgia, unspecified: Secondary | ICD-10-CM | POA: Diagnosis not present

## 2022-08-16 DIAGNOSIS — R600 Localized edema: Secondary | ICD-10-CM | POA: Diagnosis not present

## 2022-08-16 DIAGNOSIS — G8929 Other chronic pain: Secondary | ICD-10-CM | POA: Diagnosis not present

## 2022-08-17 DIAGNOSIS — N401 Enlarged prostate with lower urinary tract symptoms: Secondary | ICD-10-CM | POA: Diagnosis not present

## 2022-08-17 DIAGNOSIS — K529 Noninfective gastroenteritis and colitis, unspecified: Secondary | ICD-10-CM | POA: Diagnosis not present

## 2022-08-17 DIAGNOSIS — F1010 Alcohol abuse, uncomplicated: Secondary | ICD-10-CM | POA: Diagnosis not present

## 2022-08-17 DIAGNOSIS — M5135 Other intervertebral disc degeneration, thoracolumbar region: Secondary | ICD-10-CM | POA: Diagnosis not present

## 2022-08-17 DIAGNOSIS — E7800 Pure hypercholesterolemia, unspecified: Secondary | ICD-10-CM | POA: Diagnosis not present

## 2022-08-17 DIAGNOSIS — Z96612 Presence of left artificial shoulder joint: Secondary | ICD-10-CM | POA: Diagnosis not present

## 2022-08-17 DIAGNOSIS — G8929 Other chronic pain: Secondary | ICD-10-CM | POA: Diagnosis not present

## 2022-08-17 DIAGNOSIS — Z86718 Personal history of other venous thrombosis and embolism: Secondary | ICD-10-CM | POA: Diagnosis not present

## 2022-08-17 DIAGNOSIS — I700 Atherosclerosis of aorta: Secondary | ICD-10-CM | POA: Diagnosis not present

## 2022-08-17 DIAGNOSIS — Z87891 Personal history of nicotine dependence: Secondary | ICD-10-CM | POA: Diagnosis not present

## 2022-08-17 DIAGNOSIS — Z8701 Personal history of pneumonia (recurrent): Secondary | ICD-10-CM | POA: Diagnosis not present

## 2022-08-17 DIAGNOSIS — I4819 Other persistent atrial fibrillation: Secondary | ICD-10-CM | POA: Diagnosis not present

## 2022-08-17 DIAGNOSIS — N138 Other obstructive and reflux uropathy: Secondary | ICD-10-CM | POA: Diagnosis not present

## 2022-08-17 DIAGNOSIS — K5790 Diverticulosis of intestine, part unspecified, without perforation or abscess without bleeding: Secondary | ICD-10-CM | POA: Diagnosis not present

## 2022-08-17 DIAGNOSIS — I2510 Atherosclerotic heart disease of native coronary artery without angina pectoris: Secondary | ICD-10-CM | POA: Diagnosis not present

## 2022-08-17 DIAGNOSIS — I119 Hypertensive heart disease without heart failure: Secondary | ICD-10-CM | POA: Diagnosis not present

## 2022-08-17 DIAGNOSIS — I669 Occlusion and stenosis of unspecified cerebral artery: Secondary | ICD-10-CM | POA: Diagnosis not present

## 2022-08-17 DIAGNOSIS — M48061 Spinal stenosis, lumbar region without neurogenic claudication: Secondary | ICD-10-CM | POA: Diagnosis not present

## 2022-08-17 DIAGNOSIS — M5030 Other cervical disc degeneration, unspecified cervical region: Secondary | ICD-10-CM | POA: Diagnosis not present

## 2022-08-17 DIAGNOSIS — Z9181 History of falling: Secondary | ICD-10-CM | POA: Diagnosis not present

## 2022-08-17 DIAGNOSIS — I340 Nonrheumatic mitral (valve) insufficiency: Secondary | ICD-10-CM | POA: Diagnosis not present

## 2022-08-17 DIAGNOSIS — M419 Scoliosis, unspecified: Secondary | ICD-10-CM | POA: Diagnosis not present

## 2022-08-17 DIAGNOSIS — D509 Iron deficiency anemia, unspecified: Secondary | ICD-10-CM | POA: Diagnosis not present

## 2022-08-17 DIAGNOSIS — S22088K Other fracture of T11-T12 vertebra, subsequent encounter for fracture with nonunion: Secondary | ICD-10-CM | POA: Diagnosis not present

## 2022-08-17 DIAGNOSIS — K5901 Slow transit constipation: Secondary | ICD-10-CM | POA: Diagnosis not present

## 2022-08-21 DIAGNOSIS — M5030 Other cervical disc degeneration, unspecified cervical region: Secondary | ICD-10-CM | POA: Diagnosis not present

## 2022-08-21 DIAGNOSIS — M5416 Radiculopathy, lumbar region: Secondary | ICD-10-CM | POA: Diagnosis not present

## 2022-08-21 DIAGNOSIS — M48062 Spinal stenosis, lumbar region with neurogenic claudication: Secondary | ICD-10-CM | POA: Diagnosis not present

## 2022-08-21 DIAGNOSIS — M47816 Spondylosis without myelopathy or radiculopathy, lumbar region: Secondary | ICD-10-CM | POA: Diagnosis not present

## 2022-08-21 DIAGNOSIS — M47812 Spondylosis without myelopathy or radiculopathy, cervical region: Secondary | ICD-10-CM | POA: Diagnosis not present

## 2022-08-30 DIAGNOSIS — M48062 Spinal stenosis, lumbar region with neurogenic claudication: Secondary | ICD-10-CM | POA: Diagnosis not present

## 2022-08-30 DIAGNOSIS — M5416 Radiculopathy, lumbar region: Secondary | ICD-10-CM | POA: Diagnosis not present

## 2022-09-26 DIAGNOSIS — R7309 Other abnormal glucose: Secondary | ICD-10-CM | POA: Diagnosis not present

## 2022-09-26 DIAGNOSIS — E7800 Pure hypercholesterolemia, unspecified: Secondary | ICD-10-CM | POA: Diagnosis not present

## 2022-09-26 DIAGNOSIS — I4819 Other persistent atrial fibrillation: Secondary | ICD-10-CM | POA: Diagnosis not present

## 2022-09-26 DIAGNOSIS — D509 Iron deficiency anemia, unspecified: Secondary | ICD-10-CM | POA: Diagnosis not present

## 2022-09-26 DIAGNOSIS — Z79899 Other long term (current) drug therapy: Secondary | ICD-10-CM | POA: Diagnosis not present

## 2022-09-26 DIAGNOSIS — R030 Elevated blood-pressure reading, without diagnosis of hypertension: Secondary | ICD-10-CM | POA: Diagnosis not present

## 2022-10-02 DIAGNOSIS — M48062 Spinal stenosis, lumbar region with neurogenic claudication: Secondary | ICD-10-CM | POA: Diagnosis not present

## 2022-10-02 DIAGNOSIS — M5416 Radiculopathy, lumbar region: Secondary | ICD-10-CM | POA: Diagnosis not present

## 2022-10-02 DIAGNOSIS — M47816 Spondylosis without myelopathy or radiculopathy, lumbar region: Secondary | ICD-10-CM | POA: Diagnosis not present

## 2022-10-09 ENCOUNTER — Inpatient Hospital Stay: Payer: HMO | Attending: Oncology

## 2022-10-09 DIAGNOSIS — D509 Iron deficiency anemia, unspecified: Secondary | ICD-10-CM | POA: Insufficient documentation

## 2022-10-09 DIAGNOSIS — E538 Deficiency of other specified B group vitamins: Secondary | ICD-10-CM | POA: Insufficient documentation

## 2022-10-09 DIAGNOSIS — Z86718 Personal history of other venous thrombosis and embolism: Secondary | ICD-10-CM | POA: Insufficient documentation

## 2022-10-09 DIAGNOSIS — Z79899 Other long term (current) drug therapy: Secondary | ICD-10-CM | POA: Insufficient documentation

## 2022-10-09 DIAGNOSIS — I4891 Unspecified atrial fibrillation: Secondary | ICD-10-CM | POA: Insufficient documentation

## 2022-10-09 DIAGNOSIS — I10 Essential (primary) hypertension: Secondary | ICD-10-CM | POA: Diagnosis not present

## 2022-10-09 DIAGNOSIS — E785 Hyperlipidemia, unspecified: Secondary | ICD-10-CM | POA: Diagnosis not present

## 2022-10-09 DIAGNOSIS — Z7901 Long term (current) use of anticoagulants: Secondary | ICD-10-CM | POA: Diagnosis not present

## 2022-10-09 DIAGNOSIS — Z87891 Personal history of nicotine dependence: Secondary | ICD-10-CM | POA: Insufficient documentation

## 2022-10-09 DIAGNOSIS — I2510 Atherosclerotic heart disease of native coronary artery without angina pectoris: Secondary | ICD-10-CM | POA: Diagnosis not present

## 2022-10-09 LAB — VITAMIN B12: Vitamin B-12: 363 pg/mL (ref 180–914)

## 2022-10-09 LAB — CBC (CANCER CENTER ONLY)
HCT: 36.8 % — ABNORMAL LOW (ref 39.0–52.0)
Hemoglobin: 12.5 g/dL — ABNORMAL LOW (ref 13.0–17.0)
MCH: 32.7 pg (ref 26.0–34.0)
MCHC: 34 g/dL (ref 30.0–36.0)
MCV: 96.3 fL (ref 80.0–100.0)
Platelet Count: 180 10*3/uL (ref 150–400)
RBC: 3.82 MIL/uL — ABNORMAL LOW (ref 4.22–5.81)
RDW: 15.3 % (ref 11.5–15.5)
WBC Count: 5.5 10*3/uL (ref 4.0–10.5)
nRBC: 0 % (ref 0.0–0.2)

## 2022-10-09 LAB — FERRITIN: Ferritin: 334 ng/mL (ref 24–336)

## 2022-10-09 LAB — IRON AND TIBC
Iron: 102 ug/dL (ref 45–182)
Saturation Ratios: 34 % (ref 17.9–39.5)
TIBC: 301 ug/dL (ref 250–450)
UIBC: 199 ug/dL

## 2022-10-10 ENCOUNTER — Inpatient Hospital Stay: Payer: HMO

## 2022-10-10 ENCOUNTER — Inpatient Hospital Stay (HOSPITAL_BASED_OUTPATIENT_CLINIC_OR_DEPARTMENT_OTHER): Payer: HMO | Admitting: Oncology

## 2022-10-10 ENCOUNTER — Encounter: Payer: Self-pay | Admitting: Oncology

## 2022-10-10 VITALS — BP 99/75 | HR 86 | Temp 96.5°F | Resp 14 | Ht 66.0 in | Wt 172.0 lb

## 2022-10-10 DIAGNOSIS — D509 Iron deficiency anemia, unspecified: Secondary | ICD-10-CM | POA: Diagnosis not present

## 2022-10-10 NOTE — Progress Notes (Signed)
[facility]  [phone] [phone]) [phone]  ID: Marcus Coleman OB: [birth year]  MR#: [id number]  NGE#:[id number]  Patient Care Team: [name], MD as PCP - General (Internal Medicine) Jeralyn Ruths, MD as Consulting Physician (Hematology and Oncology)  CHIEF COMPLAINT: Iron deficiency anemia.  INTERVAL HISTORY: Patient returns to clinic today for repeat laboratory work, further evaluation, and consideration of additional IV Feraheme.  His weakness and fatigue have improved since receiving treatment 3 months ago. He has no neurologic complaints.  He denies any recent fevers or illnesses.  He has a good appetite and denies weight loss.  He denies any chest pain, shortness of breath, cough, or hemoptysis.  He denies any nausea, vomiting, constipation, or diarrhea.  He has no melena or hematochezia.  He has no urinary complaints.  Patient offers no specific complaints today.  REVIEW OF SYSTEMS:   Review of Systems  Constitutional:  Positive for malaise/fatigue. Negative for fever and weight loss.  Respiratory: Negative.  Negative for cough, hemoptysis and shortness of breath.   Cardiovascular: Negative.  Negative for chest pain and leg swelling.  Gastrointestinal: Negative.  Negative for abdominal pain, blood in stool and melena.  Genitourinary: Negative.  Negative for hematuria.  Musculoskeletal: Negative.  Negative for back pain.  Skin: Negative.  Negative for rash.  Neurological:  Positive for weakness. Negative for dizziness, focal weakness and headaches.  Psychiatric/Behavioral: Negative.  The patient is not nervous/anxious.     As per HPI. Otherwise, a complete review of systems is negative.  PAST MEDICAL HISTORY: Past Medical History:  Diagnosis Date   Aortic atherosclerosis (HCC)    Atrial fibrillation (HCC)    a.) CHA2DS2VASc = 6 (age x2, HTN, DVT x2, vascular disease history);  b.) rate/rhythm maintained on oral diltiazem +  metoprolol succinate; chronic anticoagulation (apixaban) discontinued due to GI bleeding   BPH with obstruction/lower urinary tract symptoms    Bronchitis 01/27/2022   CAD (coronary artery disease)    Cardiomegaly    Chronic diarrhea    Compression fracture of thoracic vertebra (HCC)    a.) CT renal 12/30/2021: chronic appearing T11-T12 (70% height loss)   DDD (degenerative disc disease), cervical    Diverticulosis    DVT (deep venous thrombosis) (HCC) 03/31/2019   a.) anticoagulation intiated (apixaban), however subsequently discontinued due to GI bleeding   ETOH abuse    GI bleed    Heart murmur    Hiatal hernia    Hyperlipidemia    Hypertension    IDA (iron deficiency anemia)    Low testosterone    Nephrolithiasis    Pneumonia    Rotator cuff arthropathy, left    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 ENDOSCOPY;  Service: Gastroenterology;  Laterality: N/A;   ENDOSCOPIC RETROGRADE CHOLANGIOPANCREATOGRAPHY (ERCP) WITH PROPOFOL     ENDOSCOPIC VEIN LASER TREATMENT  right leg 05/2015, left leg 09/2015   ESOPHAGOGASTRODUODENOSCOPY N/A 04/19/2019   Procedure: ESOPHAGOGASTRODUODENOSCOPY (EGD);  Surgeon: Wyline Mood, MD;  Location[facility] ENDOSCOPY;  Service: Gastroenterology;  Laterality: N/A;   ESOPHAGOGASTRODUODENOSCOPY (EGD) WITH PROPOFOL N/A 07/03/2019   Procedure: ESOPHAGOGASTRODUODENOSCOPY (EGD) WITH PROPOFOL;   Surgeon: Wyline Mood, MD;  Location: [facility] ENDOSCOPY;  Service: Gastroenterology;  Laterality: N/A;   ESOPHAGOGASTRODUODENOSCOPY (EGD) WITH PROPOFOL N/A 08/07/2019   Procedure: ESOPHAGOGASTRODUODENOSCOPY (EGD) WITH PROPOFOL;  Surgeon: Rachael Fee, MD;  Location: [facility] ENDOSCOPY;  Service: Endoscopy;  Laterality: N/A;   EUS N/A 08/07/2019   Procedure: UPPER ENDOSCOPIC ULTRASOUND (EUS) RADIAL;  Surgeon: Rachael Fee, MD;  Location: [facility] ENDOSCOPY;  Service: Endoscopy;  Laterality: N/A;   eyelid surgery     FOOT SURGERY Right 2009   HEMORROIDECTOMY     HERNIA REPAIR     mrercp  2014   NOSE SURGERY  2011   REVERSE SHOULDER ARTHROPLASTY Left 02/09/2022   Procedure: REVERSE SHOULDER ARTHROPLASTY AND BICEPS TENODESIS.;  Surgeon: Christena Flake, MD;  Location: [facility] ORS;  Service: Orthopedics;  Laterality: Left;   RHINOPLASTY     squamous cell carcinoma removed from right hand  2013   TONSILLECTOMY     WISDOM TOOTH EXTRACTION      FAMILY HISTORY: Family History  Problem Relation Age of Onset   Hypothyroidism Mother    Diabetes Mother    Lung cancer Mother     ADVANCED DIRECTIVES (Y/N):  N  HEALTH MAINTENANCE: Social History   Tobacco Use   Smoking status: Former    Current packs/day: 0.00    Average packs/day: 1 pack/day for 25.0 years (25.0 ttl pk-yrs)    Types: Cigarettes    Start date: 12/04/1961    Quit date: 12/05/1986    Years since quitting: 35.8   Smokeless tobacco: Never  Vaping Use   Vaping status: Never Used  Substance Use Topics   Alcohol use: Yes    Alcohol/week: 14.0 standard drinks of alcohol    Types: 14 Cans of beer per week    Comment: 2 beers per dayOR  a couple glasses of wine   Drug use: No     Colonoscopy:  PAP:  Bone density:  Lipid panel:  Allergies  Allergen Reactions   Nsaids     Can take Ibuprofen in moderation/high doses or prolonged use causes GI bleed    Current Outpatient Medications  Medication Sig Dispense Refill   acetaminophen  (TYLENOL) 650 MG CR tablet Take 650 mg by mouth every 8 (eight) hours as needed for pain.     cetirizine (ZYRTEC) 10 MG tablet Take 10 mg by mouth every morning.     clindamycin (CLEOCIN T) 1 % external solution Apply 1 % topically as needed. (Apply to head)     desvenlafaxine (PRISTIQ) 50 MG 24 hr tablet Take 1 tablet by mouth daily.     diltiazem (CARDIZEM) 30 MG tablet Take 1 tablet (30 mg total) by mouth every 12 (twelve) hours. 60 tablet 0   feeding supplement (ENSURE ENLIVE / ENSURE PLUS) LIQD Take 237 mLs by mouth 2 (two) times daily between meals. 14220 mL 0   fluticasone (FLONASE) 50 MCG/ACT nasal spray Place 1 spray into both nostrils daily as needed for allergies or rhinitis.  2   folic acid (FOLVITE) 1 MG tablet Take 1 tablet (1 mg total) by mouth daily. 30 tablet 0   lidocaine (LIDODERM) 5 % Place 1 patch onto the skin daily. Remove &  Discard patch within 12 hours or as directed by MD 30 patch 0   Multiple Vitamin (MULTIVITAMIN WITH MINERALS) TABS tablet Take 1 tablet by mouth daily.     nystatin cream (MYCOSTATIN) Apply topically 2 (two) times daily. 30 g 0   oxyCODONE (ROXICODONE) 5 MG immediate release tablet Take 1 tablet (5 mg total) by mouth at bedtime as needed for severe pain. 30 tablet 0   pantoprazole (PROTONIX) 40 MG tablet Take 1 tablet (40 mg total) by mouth daily. 30 tablet 0   polyethylene glycol (MIRALAX / GLYCOLAX) 17 g packet Take 17 g by mouth daily.     potassium chloride (KLOR-CON) 10 MEQ tablet Take 10 mEq by mouth daily.     sodium chloride (OCEAN) 0.65 % SOLN nasal spray Place 1 spray into both nostrils as needed for congestion. 15 mL 0   tamsulosin (FLOMAX) 0.4 MG CAPS capsule Take 1 capsule (0.4 mg total) by mouth daily. 30 capsule 0   thiamine (VITAMIN B-1) 100 MG tablet Take 1 tablet (100 mg total) by mouth daily. 30 tablet 0   timolol (TIMOPTIC) 0.5 % ophthalmic solution Place 1 drop into both eyes daily.      furosemide (LASIX) 40 MG tablet Take 40 mg by  mouth every morning. (Patient not taking: Reported on 10/10/2022)     No current facility-administered medications for this visit.    OBJECTIVE: Vitals:   10/10/22 1305  BP: 99/75  Pulse: 86  Resp: 14  Temp: (!) 96.5 F (35.8 C)  SpO2: 97%     Body mass index is 27.76 kg/m.    ECOG FS:1 - Symptomatic but completely ambulatory  General: Well-developed, well-nourished, no acute distress.  Sitting in a wheelchair. Eyes: Pink conjunctiva, anicteric sclera. HEENT: Normocephalic, moist mucous membranes. Lungs: No audible wheezing or coughing. Heart: Regular rate and rhythm. Abdomen: Soft, nontender, no obvious distention. Musculoskeletal: No edema, cyanosis, or clubbing. Neuro: Alert, answering all questions appropriately. Cranial nerves grossly intact. Skin: No rashes or petechiae noted. Psych: Normal affect.  LAB RESULTS:  Lab Results  Component Value Date   NA 137 07/21/2022   K 4.0 07/21/2022   CL 92 (L) 07/21/2022   CO2 37 (H) 07/21/2022   GLUCOSE 117 (H) 07/21/2022   BUN 20 07/21/2022   CREATININE 0.89 07/21/2022   CALCIUM 8.7 (L) 07/21/2022   PROT 6.5 07/15/2022   ALBUMIN 3.5 07/15/2022   AST 19 07/15/2022   ALT 11 07/15/2022   ALKPHOS 69 07/15/2022   BILITOT 0.7 07/15/2022   GFRNONAA >60 07/21/2022   GFRAA >60 04/21/2019    Lab Results  Component Value Date   WBC 5.5 10/09/2022   NEUTROABS 2.9 07/21/2022   HGB 12.5 (L) 10/09/2022   HCT 36.8 (L) 10/09/2022   MCV 96.3 10/09/2022   PLT 180 10/09/2022   Lab Results  Component Value Date   IRON 102 10/09/2022   TIBC 301 10/09/2022   IRONPCTSAT 34 10/09/2022   Lab Results  Component Value Date   FERRITIN 334 10/09/2022     STUDIES: No results found.  ASSESSMENT: Iron deficiency anemia  Iron deficiency anemia: Patient's hemoglobin and iron stores are now essentially within normal limits.  He is also asymptomatic. Previously, patient had a normal colonoscopy on June 26, 2016.  His most recent EGD  on Aug 07, 2019 did not reveal any significant pathology.  He does not require additional Feraheme today.  Patient last received treatment on Jul 13, 2022.  Return to clinic  in 3 months with repeat laboratory work, further evaluation, and consideration of additional treatment if necessary.   B12 deficiency: Resolved.  Patient last received treatment on Jul 26, 2021. Peripheral edema: Chronic and unchanged.  I spent a total of 20 minutes reviewing chart data, face-to-face evaluation with the patient, counseling and coordination of care as detailed above.   Patient expressed understanding and was in agreement with this plan. He also understands that He can call clinic at any time with any questions, concerns, or complaints.    Jeralyn Ruths, MD   10/10/2022 2:23 PM

## 2022-10-20 DIAGNOSIS — H401111 Primary open-angle glaucoma, right eye, mild stage: Secondary | ICD-10-CM | POA: Diagnosis not present

## 2022-10-20 DIAGNOSIS — M47816 Spondylosis without myelopathy or radiculopathy, lumbar region: Secondary | ICD-10-CM | POA: Diagnosis not present

## 2022-10-20 DIAGNOSIS — H04123 Dry eye syndrome of bilateral lacrimal glands: Secondary | ICD-10-CM | POA: Diagnosis not present

## 2022-10-20 DIAGNOSIS — H353131 Nonexudative age-related macular degeneration, bilateral, early dry stage: Secondary | ICD-10-CM | POA: Diagnosis not present

## 2022-10-20 DIAGNOSIS — H40002 Preglaucoma, unspecified, left eye: Secondary | ICD-10-CM | POA: Diagnosis not present

## 2022-11-10 ENCOUNTER — Encounter: Payer: Self-pay | Admitting: Oncology

## 2022-11-17 ENCOUNTER — Ambulatory Visit
Admission: RE | Admit: 2022-11-17 | Discharge: 2022-11-17 | Disposition: A | Discharge status: Home / Self Care | Payer: HMO | Source: Ambulatory Visit | Attending: Urology | Admitting: Urology

## 2022-11-17 ENCOUNTER — Encounter: Payer: Self-pay | Admitting: Urology

## 2022-11-17 ENCOUNTER — Ambulatory Visit (INDEPENDENT_AMBULATORY_CARE_PROVIDER_SITE_OTHER): Payer: HMO | Admitting: Urology

## 2022-11-17 ENCOUNTER — Encounter: Payer: Self-pay | Admitting: Oncology

## 2022-11-17 VITALS — BP 104/72 | HR 67 | Ht 68.0 in | Wt 173.0 lb

## 2022-11-17 DIAGNOSIS — N200 Calculus of kidney: Secondary | ICD-10-CM

## 2022-11-17 DIAGNOSIS — R059 Cough, unspecified: Secondary | ICD-10-CM

## 2022-11-17 DIAGNOSIS — K449 Diaphragmatic hernia without obstruction or gangrene: Secondary | ICD-10-CM | POA: Diagnosis not present

## 2022-11-17 DIAGNOSIS — R3129 Other microscopic hematuria: Secondary | ICD-10-CM | POA: Diagnosis not present

## 2022-11-17 DIAGNOSIS — N401 Enlarged prostate with lower urinary tract symptoms: Secondary | ICD-10-CM | POA: Diagnosis not present

## 2022-11-17 DIAGNOSIS — I878 Other specified disorders of veins: Secondary | ICD-10-CM | POA: Diagnosis not present

## 2022-11-17 LAB — BLADDER SCAN AMB NON-IMAGING: Scan Result: 10

## 2022-11-17 MED ORDER — GEMTESA 75 MG PO TABS: 75.0000 mg | ORAL_TABLET | Freq: Every day | ORAL | Status: DC

## 2022-11-17 NOTE — Progress Notes (Signed)
I, Maysun Anabel Bene, acting as a scribe for Riki Altes, MD., have documented all relevant documentation on the behalf of Riki Altes, MD, as directed by Riki Altes, MD while in the presence of Riki Altes, MD.  11/17/2022 1:19 PM   Marcus Coleman [birth year] [id number]  Referring provider: [name], MD [address] [facility] [place],  [place] [address]  Chief Complaint  Patient presents with   Nephrolithiasis   Urologic history: 1. Benign prostatic hyperplasia with lower urinary tract symptoms   HPI: Marcus Coleman is a 82 y.o. male presents for an annual follow-up of BPH. He is accompanied today by his wife.  He discontinued tamsulosin over 1 year ago. He has recently noted urinary frequency, urgency, with urge incontinence.  At his office visit last year, he was noted to have microhematuria and a CT urogram performed showed a non-obstructing 5mm left upper pole calculus. Cystoscopy with lateral lobe enlargement and no bladder mucosal abnormalities. He was taking a medication 2 nights ago, which he states got lodged in his esophagus and he's had several coughing spells. His wife is concerned about possible aspiration.  No fever or chills.   PMH: Past Medical History:  Diagnosis Date   Aortic atherosclerosis (HCC)    Atrial fibrillation (HCC)    a.) CHA2DS2VASc = 6 (age x2, HTN, DVT x2, vascular disease history);  b.) rate/rhythm maintained on oral diltiazem + metoprolol succinate; chronic anticoagulation (apixaban) discontinued due to GI bleeding   BPH with obstruction/lower urinary tract symptoms    Bronchitis 01/27/2022   CAD (coronary artery disease)    Cardiomegaly    Chronic diarrhea    Compression fracture of thoracic vertebra (HCC)    a.) CT renal 12/30/2021: chronic appearing T11-T12 (70% height loss)   DDD (degenerative disc disease), cervical    Diverticulosis    DVT (deep venous thrombosis) (HCC) 03/31/2019   a.)  anticoagulation intiated (apixaban), however subsequently discontinued due to GI bleeding   ETOH abuse    GI bleed    Heart murmur    Hiatal hernia    Hyperlipidemia    Hypertension    IDA (iron deficiency anemia)    Low testosterone    Nephrolithiasis    Pneumonia    Rotator cuff arthropathy, left    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    Vasovagal syncope     Surgical History: Past Surgical History:  Procedure Laterality Date   boils removed from right forearm  1968   CARDIAC CATHETERIZATION     CATARACT EXTRACTION Left 2008   catracts Bilateral    left 2008, right 2013   CHOLECYSTECTOMY     COLONOSCOPY WITH PROPOFOL N/A 06/26/2016   Procedure: COLONOSCOPY WITH PROPOFOL;  Surgeon: Scot Jun, MD;  Location: [facility] ENDOSCOPY;  Service: Endoscopy;  Laterality: N/A;   COLONOSCOPY WITH PROPOFOL N/A 07/03/2019   Procedure: COLONOSCOPY WITH PROPOFOL;  Surgeon: Wyline Mood, MD;  Location: [facility] ENDOSCOPY;  Service: Gastroenterology;  Laterality: N/A;   ENDOSCOPIC RETROGRADE CHOLANGIOPANCREATOGRAPHY (ERCP) WITH PROPOFOL     ENDOSCOPIC VEIN LASER TREATMENT     right leg 05/2015, left leg 09/2015   ESOPHAGOGASTRODUODENOSCOPY N/A 04/19/2019   Procedure: ESOPHAGOGASTRODUODENOSCOPY (EGD);  Surgeon: Wyline Mood, MD;  Location: [facility] ENDOSCOPY;  Service: Gastroenterology;  Laterality: N/A;   ESOPHAGOGASTRODUODENOSCOPY (EGD) WITH PROPOFOL N/A 07/03/2019   Procedure: ESOPHAGOGASTRODUODENOSCOPY (EGD) WITH PROPOFOL;  Surgeon: Wyline Mood, MD;  Location: [facility] ENDOSCOPY;  Service: Gastroenterology;  Laterality: N/A;   ESOPHAGOGASTRODUODENOSCOPY (EGD) WITH PROPOFOL N/A 08/07/2019   Procedure: ESOPHAGOGASTRODUODENOSCOPY (EGD) WITH PROPOFOL;  Surgeon: Rachael Fee, MD;  Location: [facility] ENDOSCOPY;  Service: Endoscopy;  Laterality: N/A;   EUS N/A 08/07/2019   Procedure: UPPER ENDOSCOPIC ULTRASOUND (EUS) RADIAL;  Surgeon: Rachael Fee, MD;  Location: [facility] ENDOSCOPY;  Service: Endoscopy;   Laterality: N/A;   eyelid surgery     FOOT SURGERY Right 2009   HEMORROIDECTOMY     HERNIA REPAIR     mrercp  2014   NOSE SURGERY  2011   REVERSE SHOULDER ARTHROPLASTY Left 02/09/2022   Procedure: REVERSE SHOULDER ARTHROPLASTY AND BICEPS TENODESIS.;  Surgeon: Christena Flake, MD;  Location: [facility] ORS;  Service: Orthopedics;  Laterality: Left;   RHINOPLASTY     squamous cell carcinoma removed from right hand  2013   TONSILLECTOMY     WISDOM TOOTH EXTRACTION      Home Medications:  Allergies as of 11/17/2022       Reactions   Nsaids    Can take Ibuprofen in moderation/high doses or prolonged use causes GI bleed        Medication List        Accurate as of November 17, 2022  1:19 PM. If you have any questions, ask your nurse or doctor.          STOP taking these medications    folic acid 1 MG tablet Commonly known as: FOLVITE Stopped by: Riki Altes   furosemide 40 MG tablet Commonly known as: LASIX Stopped by: Riki Altes   oxyCODONE 5 MG immediate release tablet Commonly known as: Roxicodone Stopped by: Riki Altes   tamsulosin 0.4 MG Caps capsule Commonly known as: FLOMAX Stopped by: Riki Altes   thiamine 100 MG tablet Commonly known as: Vitamin B-1 Stopped by: Riki Altes       TAKE these medications    acetaminophen 650 MG CR tablet Commonly known as: TYLENOL Take 650 mg by mouth every 8 (eight) hours as needed for pain.   cetirizine 10 MG tablet Commonly known as: ZYRTEC Take 10 mg by mouth every morning.   clindamycin 1 % external solution Commonly known as: CLEOCIN T Apply 1 % topically as needed. (Apply to head)   desvenlafaxine 50 MG 24 hr tablet Commonly known as: PRISTIQ Take 1 tablet by mouth daily.   diltiazem 30 MG tablet Commonly known as: CARDIZEM Take 1 tablet (30 mg total) by mouth every 12 (twelve) hours.   feeding supplement Liqd Take 237 mLs by mouth 2 (two) times daily between meals.   fluticasone  50 MCG/ACT nasal spray Commonly known as: FLONASE Place 1 spray into both nostrils daily as needed for allergies or rhinitis.   Gemtesa 75 MG Tabs Generic drug: Vibegron Take 1 tablet (75 mg total) by mouth daily. Started by: Riki Altes   lidocaine 5 % Commonly known as: LIDODERM Place 1 patch onto the skin daily. Remove & Discard patch within 12 hours or as directed by MD   multivitamin with minerals Tabs tablet Take 1 tablet by mouth daily.   nystatin cream Commonly known as: MYCOSTATIN Apply topically 2 (two) times daily.   pantoprazole 40 MG tablet Commonly known as: PROTONIX Take 1 tablet (40 mg total) by mouth daily.   polyethylene glycol 17 g packet Commonly known as: MIRALAX / GLYCOLAX Take 17 g by mouth daily.   potassium chloride 10 MEQ tablet Commonly known as:  KLOR-CON Take 10 mEq by mouth daily.   sodium chloride 0.65 % Soln nasal spray Commonly known as: OCEAN Place 1 spray into both nostrils as needed for congestion.   timolol 0.5 % ophthalmic solution Commonly known as: TIMOPTIC Place 1 drop into both eyes daily.        Allergies:  Allergies  Allergen Reactions   Nsaids     Can take Ibuprofen in moderation/high doses or prolonged use causes GI bleed    Family History: Family History  Problem Relation Age of Onset   Hypothyroidism Mother    Diabetes Mother    Lung cancer Mother     Social History:  reports that he quit smoking about 35 years ago. His smoking use included cigarettes. He started smoking about 60 years ago. He has a 25 pack-year smoking history. He has never used smokeless tobacco. He reports current alcohol use of about 14.0 standard drinks of alcohol per week. He reports that he does not use drugs.   Physical Exam: BP 104/72   Pulse 67   Ht 5\' 8"  (1.727 m)   Wt 173 lb (78.5 kg)   BMI 26.30 kg/m   Constitutional:  Alert and oriented, No acute distress. HEENT: [place] AT Respiratory: Normal respiratory effort, no  increased work of breathing. Psychiatric: Normal mood and affect.   Pertinent Imaging: KUB performed today was personally reviewed and interpreted. There was a large amount of stool and bowel gas obscuring the renal outlines. There is a spherical calcification in the vicinity of the inferior portion of the left renal shadow. The upper pole calcification is not visualized on today's KUB, but may be obscured due to overlying stool and bowel gas.    Assessment & Plan:    1. BPH with LUTS  Recent onset of storage-related voiding symptoms. No longer taking tamsulosin.  PVR today 10 mL Given Gemtesa samples 75 mg daily x1 month to see if his storage-related voiding symptoms improve.  2. Left nephrolithiasis On review of his CT, the calcification most likely is intraparenchymal. Not visualized on today's KUB.   3. Cough Chest X-ray ordered.  I have reviewed the above documentation for accuracy and completeness, and I agree with the above.   Riki Altes, MD  [facility] [address], [address] [place], [place] [address] [phone]

## 2022-11-19 ENCOUNTER — Encounter: Payer: Self-pay | Admitting: Urology

## 2022-11-20 NOTE — Group Note (Deleted)

## 2022-11-21 ENCOUNTER — Encounter: Payer: Self-pay | Admitting: Oncology

## 2022-11-21 DIAGNOSIS — R030 Elevated blood-pressure reading, without diagnosis of hypertension: Secondary | ICD-10-CM | POA: Diagnosis not present

## 2022-11-21 DIAGNOSIS — K449 Diaphragmatic hernia without obstruction or gangrene: Secondary | ICD-10-CM | POA: Diagnosis not present

## 2022-11-21 DIAGNOSIS — I4819 Other persistent atrial fibrillation: Secondary | ICD-10-CM | POA: Diagnosis not present

## 2022-11-21 DIAGNOSIS — J9811 Atelectasis: Secondary | ICD-10-CM | POA: Diagnosis not present

## 2022-11-21 DIAGNOSIS — J209 Acute bronchitis, unspecified: Secondary | ICD-10-CM | POA: Diagnosis not present

## 2022-11-21 DIAGNOSIS — I700 Atherosclerosis of aorta: Secondary | ICD-10-CM | POA: Diagnosis not present

## 2022-11-22 DIAGNOSIS — S22088K Other fracture of T11-T12 vertebra, subsequent encounter for fracture with nonunion: Secondary | ICD-10-CM | POA: Diagnosis not present

## 2022-11-22 DIAGNOSIS — M48061 Spinal stenosis, lumbar region without neurogenic claudication: Secondary | ICD-10-CM | POA: Diagnosis not present

## 2022-11-22 DIAGNOSIS — N401 Enlarged prostate with lower urinary tract symptoms: Secondary | ICD-10-CM | POA: Diagnosis not present

## 2022-11-22 DIAGNOSIS — G8929 Other chronic pain: Secondary | ICD-10-CM | POA: Diagnosis not present

## 2022-11-22 DIAGNOSIS — I700 Atherosclerosis of aorta: Secondary | ICD-10-CM | POA: Diagnosis not present

## 2022-11-22 DIAGNOSIS — I119 Hypertensive heart disease without heart failure: Secondary | ICD-10-CM | POA: Diagnosis not present

## 2022-11-22 DIAGNOSIS — I4819 Other persistent atrial fibrillation: Secondary | ICD-10-CM | POA: Diagnosis not present

## 2022-11-27 DIAGNOSIS — M47812 Spondylosis without myelopathy or radiculopathy, cervical region: Secondary | ICD-10-CM | POA: Diagnosis not present

## 2022-11-27 DIAGNOSIS — M5030 Other cervical disc degeneration, unspecified cervical region: Secondary | ICD-10-CM | POA: Diagnosis not present

## 2022-11-27 DIAGNOSIS — M47816 Spondylosis without myelopathy or radiculopathy, lumbar region: Secondary | ICD-10-CM | POA: Diagnosis not present

## 2022-11-27 DIAGNOSIS — M48062 Spinal stenosis, lumbar region with neurogenic claudication: Secondary | ICD-10-CM | POA: Diagnosis not present

## 2022-11-27 DIAGNOSIS — M5416 Radiculopathy, lumbar region: Secondary | ICD-10-CM | POA: Diagnosis not present

## 2022-11-29 ENCOUNTER — Emergency Department
Admission: EM | Admit: 2022-11-29 | Discharge: 2022-11-29 | Disposition: A | Discharge status: Home / Self Care | Payer: HMO | Attending: Emergency Medicine | Admitting: Emergency Medicine

## 2022-11-29 DIAGNOSIS — I959 Hypotension, unspecified: Secondary | ICD-10-CM | POA: Diagnosis not present

## 2022-11-29 DIAGNOSIS — E11649 Type 2 diabetes mellitus with hypoglycemia without coma: Secondary | ICD-10-CM | POA: Diagnosis not present

## 2022-11-29 DIAGNOSIS — R55 Syncope and collapse: Secondary | ICD-10-CM | POA: Diagnosis not present

## 2022-11-29 DIAGNOSIS — I10 Essential (primary) hypertension: Secondary | ICD-10-CM | POA: Diagnosis not present

## 2022-11-29 DIAGNOSIS — E162 Hypoglycemia, unspecified: Secondary | ICD-10-CM | POA: Insufficient documentation

## 2022-11-29 DIAGNOSIS — I4891 Unspecified atrial fibrillation: Secondary | ICD-10-CM | POA: Diagnosis not present

## 2022-11-29 LAB — URINALYSIS, ROUTINE W REFLEX MICROSCOPIC
Bilirubin Urine: NEGATIVE
Glucose, UA: NEGATIVE mg/dL
Ketones, ur: NEGATIVE mg/dL
Leukocytes,Ua: NEGATIVE
Nitrite: NEGATIVE
Protein, ur: NEGATIVE mg/dL
RBC / HPF: >50 RBC/hpf (ref 0–5)
Specific Gravity, Urine: 1.029 (ref 1.005–1.030)
Squamous Epithelial / HPF: 0 /HPF (ref 0–5)
pH: 5 (ref 5.0–8.0)

## 2022-11-29 LAB — CBC
HCT: 35.5 % — ABNORMAL LOW (ref 39.0–52.0)
Hemoglobin: 11.6 g/dL — ABNORMAL LOW (ref 13.0–17.0)
MCH: 33.4 pg (ref 26.0–34.0)
MCHC: 32.7 g/dL (ref 30.0–36.0)
MCV: 102.3 fL — ABNORMAL HIGH (ref 80.0–100.0)
Platelets: 205 10*3/uL (ref 150–400)
RBC: 3.47 MIL/uL — ABNORMAL LOW (ref 4.22–5.81)
RDW: 12.9 % (ref 11.5–15.5)
WBC: 8.8 10*3/uL (ref 4.0–10.5)
nRBC: 0 % (ref 0.0–0.2)

## 2022-11-29 LAB — BASIC METABOLIC PANEL WITH GFR
Anion gap: 6 (ref 5–15)
BUN: 24 mg/dL — ABNORMAL HIGH (ref 8–23)
CO2: 28 mmol/L (ref 22–32)
Calcium: 8.3 mg/dL — ABNORMAL LOW (ref 8.9–10.3)
Chloride: 106 mmol/L (ref 98–111)
Creatinine, Ser: 0.64 mg/dL (ref 0.61–1.24)
GFR, Estimated: >60 mL/min (ref >60)
Glucose, Bld: 78 mg/dL (ref 70–99)
Potassium: 3.2 mmol/L — ABNORMAL LOW (ref 3.5–5.1)
Sodium: 140 mmol/L (ref 135–145)

## 2022-11-29 LAB — CBG MONITORING, ED
Glucose-Capillary: 64 mg/dL — ABNORMAL LOW (ref 70–99)
Glucose-Capillary: 90 mg/dL (ref 70–99)
Glucose-Capillary: 82 mg/dL (ref 70–99)

## 2022-11-29 LAB — TROPONIN I (HIGH SENSITIVITY): Troponin I (High Sensitivity): 6 ng/L (ref <18)

## 2022-11-29 MED ORDER — SODIUM CHLORIDE 0.9 % IV BOLUS
1000.0000 mL | Freq: Once | INTRAVENOUS | Status: AC
  Administered 2022-11-29: 1000 mL via INTRAVENOUS

## 2022-11-29 NOTE — Discharge Instructions (Signed)
Make sure you eat consistent meals to keep her blood sugar in a normal range.  Fortunately aside from some mildly low blood sugar the rest of your testing in the emergency department was normal.  Follow-up with your doctor this week.  Thank you for choosing [place] for your health care today!  Please see your primary doctor this week for a follow up appointment.   If you have any new, worsening, or unexpected symptoms call your doctor right away or come back to the emergency department for reevaluation.  It was my pleasure to care for you today.   Daneil Dan Modesto Charon, MD

## 2022-11-29 NOTE — ED Notes (Signed)
Patient eating, wife at the bedside.  Will check CBG when finished.

## 2022-11-29 NOTE — ED Notes (Signed)
Pt given two orange juice containers d/t CBG

## 2022-11-29 NOTE — ED Triage Notes (Signed)
Pt arrives via [facility] for syncopal episode. Pt's wife reported to EMS that pt was sitting on his recliner talking with her and then had syncopal episode. Initially with EMS BP 81/48, improved to 96/55 after 500 mL NS Bolus.

## 2022-11-29 NOTE — ED Provider Notes (Addendum)
[facility] Provider Note    Event Date/Time   First MD Initiated Contact with Patient 11/29/22 1850     (approximate)   History   Loss of Consciousness   HPI  Marcus Coleman is a 82 y.o. male   Past medical history of fibrillation not on anticoagulation due to GI bleeding, hypertension, hyperlipidemia, iron deficiency anemia, BPH, who presents the emergency department with questionable syncope.  He states that he was napping on his recliner when his wife went out shopping and when she came back she tried to wake him from his recliner and found him to be sleepy so she was afraid that he had syncopized.  He states that he took a nap and awoke and felt fine.  He denies any palpitations, shortness of breath, chest pain or any other acute medical complaints and has been in his regular state of health.  When he stood from his recliner he did not feel dizzy.  His wife is present to give collateral information.  Patient has been eating less as of late and slept late today missing breakfast.  She came back into the room he said he felt tired after performing some errands earlier in the morning and fell asleep in the recliner was difficult to wake up.  Approximately 2 minutes of trying to awaken him he woke up.    External Medical Documents Reviewed: Echo from 07/18/2022 showing ejection fraction 60%      Physical Exam   Triage Vital Signs: ED Triage Vitals [11/29/22 1824]  Encounter Vitals Group     BP 103/69     Systolic BP Percentile      Diastolic BP Percentile      Pulse Rate 77     Resp 16     Temp 98 F (36.7 C)     Temp src      SpO2 95 %     Weight      Height      Head Circumference      Peak Flow      Pain Score 0     Pain Loc      Pain Education      Exclude from Growth Chart     Most recent vital signs: Vitals:   11/29/22 1824  BP: 103/69  Pulse: 77  Resp: 16  Temp: 98 F (36.7 C)  SpO2: 95%    General: Awake, no distress.   CV:  Good peripheral perfusion.  Resp:  Normal effort.  Abd:  No distention.  Other:  Awake alert comfortable appearing in no acute distress.  Blood pressure 100/69.  Afebrile.  Atrial fibrillation with normal rates.  Appears slightly dehydrated with dry mucous membranes.  Soft nontender abdomen and clear lungs.  Moving all extremities full active range of motion no dysarthria no facial asymmetry in this pleasant comfortable appearing patient.   ED Results / Procedures / Treatments   Labs (all labs ordered are listed, but only abnormal results are displayed) Labs Reviewed  BASIC METABOLIC PANEL - Abnormal; Notable for the following components:      Result Value   Potassium 3.2 (*)    BUN 24 (*)    Calcium 8.3 (*)    All other components within normal limits  CBC - Abnormal; Notable for the following components:   RBC 3.47 (*)    Hemoglobin 11.6 (*)    HCT 35.5 (*)    MCV 102.3 (*)    All  other components within normal limits  CBG MONITORING, ED - Abnormal; Notable for the following components:   Glucose-Capillary 64 (*)    All other components within normal limits  URINALYSIS, ROUTINE W REFLEX MICROSCOPIC  CBG MONITORING, ED  TROPONIN I (HIGH SENSITIVITY)     I ordered and reviewed the above labs they are notable for cell counts and electrolytes largely unremarkable.  EKG  ED ECG REPORT I, Pilar Jarvis, the attending physician, personally viewed and interpreted this ECG.   Date: 11/29/2022  EKG Time: 1831  Rate: 67  Rhythm: AF  Axis: nl  Intervals:none  ST&T Change: no stemi   PROCEDURES:  Critical Care performed: No  Procedures   MEDICATIONS ORDERED IN ED: Medications  sodium chloride 0.9 % bolus 1,000 mL (has no administration in time range)    IMPRESSION / MDM / ASSESSMENT AND PLAN / ED COURSE  I reviewed the triage vital signs and the nursing notes.                                Patient's presentation is most consistent with acute presentation  with potential threat to life or bodily function.  Differential diagnosis includes, but is not limited to, syncope, dysrhythmia, ACS, PE, stroke, dehydration, infection   The patient is on the cardiac monitor to evaluate for evidence of arrhythmia and/or significant heart rate changes.  MDM:    Concern for syncope but patient states that he was just sleeping on his recliner and was woken and feels completely normal.  He is still without feeling dizzy, he denies any presyncopal symptoms, denies chest pain or shortness of breath.  His blood sugar was slightly low at 64, on recheck 98, we will feed and monitor blood sugar.  I considered ACS, PE, dysrhythmia but I doubt given completely asymptomatic.  He looks slightly dehydrated with a slightly low blood pressure.  I will give him some IV fluid hydration, check basic labs, troponin, EKG keep on cardiac monitor and if unremarkable workup and patient continues to feel completely asymptomatic I do not think he needs to be hospitalized for further evaluation.  He will be discharged with PMD follow-up and return with any new or worsening symptoms.       FINAL CLINICAL IMPRESSION(S) / ED DIAGNOSES   Final diagnoses:  Syncope, unspecified syncope type  Hypoglycemia     Rx / DC Orders   ED Discharge Orders     None        Note:  This document was prepared using Dragon voice recognition software and may include unintentional dictation errors.    Pilar Jarvis, MD 11/29/22 Manon Hilding    Pilar Jarvis, MD 11/29/22 2005

## 2022-12-01 DIAGNOSIS — I4819 Other persistent atrial fibrillation: Secondary | ICD-10-CM | POA: Diagnosis not present

## 2022-12-01 DIAGNOSIS — D509 Iron deficiency anemia, unspecified: Secondary | ICD-10-CM | POA: Diagnosis not present

## 2022-12-01 DIAGNOSIS — R030 Elevated blood-pressure reading, without diagnosis of hypertension: Secondary | ICD-10-CM | POA: Diagnosis not present

## 2022-12-01 DIAGNOSIS — Z09 Encounter for follow-up examination after completed treatment for conditions other than malignant neoplasm: Secondary | ICD-10-CM | POA: Diagnosis not present

## 2022-12-04 ENCOUNTER — Encounter: Payer: Self-pay | Admitting: Oncology

## 2022-12-07 ENCOUNTER — Telehealth: Payer: Self-pay | Admitting: *Deleted

## 2022-12-07 NOTE — Telephone Encounter (Signed)
I called and left a message on voice mail with doctor response and asked if she wants him to come in for lab check prior to his 10/30 appointment to call back to scheduling to get date and time for that

## 2022-12-07 NOTE — Telephone Encounter (Signed)
Wife [name] called asking to speak with Dr Orlie Dakin for Marcus Coleman's lack of activity and if she is to now be his caretaker and if this is expected or can anything be done for it. She reports that he was recently taken to ER (syncope and hypoglycemia) We see him for IDA Please advise

## 2022-12-20 DIAGNOSIS — H401111 Primary open-angle glaucoma, right eye, mild stage: Secondary | ICD-10-CM | POA: Diagnosis not present

## 2022-12-28 DIAGNOSIS — H40002 Preglaucoma, unspecified, left eye: Secondary | ICD-10-CM | POA: Diagnosis not present

## 2022-12-28 DIAGNOSIS — Z961 Presence of intraocular lens: Secondary | ICD-10-CM | POA: Diagnosis not present

## 2022-12-28 DIAGNOSIS — H401111 Primary open-angle glaucoma, right eye, mild stage: Secondary | ICD-10-CM | POA: Diagnosis not present

## 2022-12-28 DIAGNOSIS — H353131 Nonexudative age-related macular degeneration, bilateral, early dry stage: Secondary | ICD-10-CM | POA: Diagnosis not present

## 2023-01-04 DIAGNOSIS — R030 Elevated blood-pressure reading, without diagnosis of hypertension: Secondary | ICD-10-CM | POA: Diagnosis not present

## 2023-01-04 DIAGNOSIS — D509 Iron deficiency anemia, unspecified: Secondary | ICD-10-CM | POA: Diagnosis not present

## 2023-01-04 DIAGNOSIS — R7309 Other abnormal glucose: Secondary | ICD-10-CM | POA: Diagnosis not present

## 2023-01-04 DIAGNOSIS — Z23 Encounter for immunization: Secondary | ICD-10-CM | POA: Diagnosis not present

## 2023-01-04 DIAGNOSIS — Z79899 Other long term (current) drug therapy: Secondary | ICD-10-CM | POA: Diagnosis not present

## 2023-01-04 DIAGNOSIS — E782 Mixed hyperlipidemia: Secondary | ICD-10-CM | POA: Diagnosis not present

## 2023-01-04 DIAGNOSIS — I4819 Other persistent atrial fibrillation: Secondary | ICD-10-CM | POA: Diagnosis not present

## 2023-01-09 ENCOUNTER — Other Ambulatory Visit: Payer: Self-pay | Admitting: *Deleted

## 2023-01-09 DIAGNOSIS — D509 Iron deficiency anemia, unspecified: Secondary | ICD-10-CM

## 2023-01-10 ENCOUNTER — Inpatient Hospital Stay: Payer: HMO | Attending: Oncology

## 2023-01-10 DIAGNOSIS — D509 Iron deficiency anemia, unspecified: Secondary | ICD-10-CM | POA: Insufficient documentation

## 2023-01-10 LAB — CBC WITH DIFFERENTIAL (CANCER CENTER ONLY)
Abs Immature Granulocytes: 0.01 10*3/uL (ref 0.00–0.07)
Basophils Absolute: 0 10*3/uL (ref 0.0–0.1)
Basophils Relative: 0 %
Eosinophils Absolute: 0.2 10*3/uL (ref 0.0–0.5)
Eosinophils Relative: 3 %
HCT: 36.3 % — ABNORMAL LOW (ref 39.0–52.0)
Hemoglobin: 12 g/dL — ABNORMAL LOW (ref 13.0–17.0)
Immature Granulocytes: 0 %
Lymphocytes Relative: 20 %
Lymphs Abs: 1.2 10*3/uL (ref 0.7–4.0)
MCH: 33 pg (ref 26.0–34.0)
MCHC: 33.1 g/dL (ref 30.0–36.0)
MCV: 99.7 fL (ref 80.0–100.0)
Monocytes Absolute: 0.7 10*3/uL (ref 0.1–1.0)
Monocytes Relative: 12 %
Neutro Abs: 3.8 10*3/uL (ref 1.7–7.7)
Neutrophils Relative %: 65 %
Platelet Count: 144 10*3/uL — ABNORMAL LOW (ref 150–400)
RBC: 3.64 MIL/uL — ABNORMAL LOW (ref 4.22–5.81)
RDW: 12.7 % (ref 11.5–15.5)
WBC Count: 5.9 10*3/uL (ref 4.0–10.5)
nRBC: 0 % (ref 0.0–0.2)

## 2023-01-10 LAB — IRON AND TIBC
Iron: 50 ug/dL (ref 45–182)
Saturation Ratios: 16 % — ABNORMAL LOW (ref 17.9–39.5)
TIBC: 316 ug/dL (ref 250–450)
UIBC: 266 ug/dL

## 2023-01-10 LAB — FERRITIN: Ferritin: 128 ng/mL (ref 24–336)

## 2023-01-11 ENCOUNTER — Encounter: Payer: Self-pay | Admitting: Oncology

## 2023-01-11 ENCOUNTER — Inpatient Hospital Stay: Payer: HMO

## 2023-01-11 ENCOUNTER — Inpatient Hospital Stay (HOSPITAL_BASED_OUTPATIENT_CLINIC_OR_DEPARTMENT_OTHER): Payer: HMO | Admitting: Oncology

## 2023-01-11 VITALS — BP 107/67 | HR 80

## 2023-01-11 VITALS — BP 128/70 | HR 82 | Temp 96.1°F | Resp 16 | Ht 68.0 in | Wt 187.0 lb

## 2023-01-11 DIAGNOSIS — E538 Deficiency of other specified B group vitamins: Secondary | ICD-10-CM | POA: Diagnosis not present

## 2023-01-11 DIAGNOSIS — D509 Iron deficiency anemia, unspecified: Secondary | ICD-10-CM

## 2023-01-11 MED ORDER — SODIUM CHLORIDE 0.9 % IV SOLN
Freq: Once | INTRAVENOUS | Status: AC
  Filled 2023-01-11: qty 250

## 2023-01-11 MED ORDER — SODIUM CHLORIDE 0.9 % IV SOLN
510.0000 mg | Freq: Once | INTRAVENOUS | Status: AC
  Administered 2023-01-11: 510 mg via INTRAVENOUS
  Filled 2023-01-11: qty 17

## 2023-01-11 NOTE — Progress Notes (Signed)
Refused 30 minute post observation. Aware of risks. Vitals stable at discharge.  

## 2023-01-11 NOTE — Patient Instructions (Signed)
 Ferumoxytol Injection What is this medication? FERUMOXYTOL (FER ue MOX i tol) treats low levels of iron in your body (iron deficiency anemia). Iron is a mineral that plays an important role in making red blood cells, which carry oxygen from your lungs to the rest of your body. This medicine may be used for other purposes; ask your health care provider or pharmacist if you have questions. COMMON BRAND NAME(S): Feraheme What should I tell my care team before I take this medication? They need to know if you have any of these conditions: Anemia not caused by low iron levels High levels of iron in the blood Magnetic resonance imaging (MRI) test scheduled An unusual or allergic reaction to iron, other medications, foods, dyes, or preservatives Pregnant or trying to get pregnant Breastfeeding How should I use this medication? This medication is injected into a vein. It is given by your care team in a hospital or clinic setting. Talk to your care team the use of this medication in children. Special care may be needed. Overdosage: If you think you have taken too much of this medicine contact a poison control center or emergency room at once. NOTE: This medicine is only for you. Do not share this medicine with others. What if I miss a dose? It is important not to miss your dose. Call your care team if you are unable to keep an appointment. What may interact with this medication? Other iron products This list may not describe all possible interactions. Give your health care provider a list of all the medicines, herbs, non-prescription drugs, or dietary supplements you use. Also tell them if you smoke, drink alcohol, or use illegal drugs. Some items may interact with your medicine. What should I watch for while using this medication? Visit your care team regularly. Tell your care team if your symptoms do not start to get better or if they get worse. You may need blood work done while you are taking this  medication. You may need to follow a special diet. Talk to your care team. Foods that contain iron include: whole grains/cereals, dried fruits, beans, or peas, leafy green vegetables, and organ meats (liver, kidney). What side effects may I notice from receiving this medication? Side effects that you should report to your care team as soon as possible: Allergic reactions--skin rash, itching, hives, swelling of the face, lips, tongue, or throat Low blood pressure--dizziness, feeling faint or lightheaded, blurry vision Shortness of breath Side effects that usually do not require medical attention (report to your care team if they continue or are bothersome): Flushing Headache Joint pain Muscle pain Nausea Pain, redness, or irritation at injection site This list may not describe all possible side effects. Call your doctor for medical advice about side effects. You may report side effects to FDA at 1-800-FDA-1088. Where should I keep my medication? This medication is given in a hospital or clinic. It will not be stored at home. NOTE: This sheet is a summary. It may not cover all possible information. If you have questions about this medicine, talk to your doctor, pharmacist, or health care provider.  2024 Elsevier/Gold Standard (2022-08-04 00:00:00)

## 2023-01-12 ENCOUNTER — Encounter: Payer: Self-pay | Admitting: Oncology

## 2023-02-14 DIAGNOSIS — D0471 Carcinoma in situ of skin of right lower limb, including hip: Secondary | ICD-10-CM | POA: Diagnosis not present

## 2023-02-14 DIAGNOSIS — D2272 Melanocytic nevi of left lower limb, including hip: Secondary | ICD-10-CM | POA: Diagnosis not present

## 2023-02-14 DIAGNOSIS — D485 Neoplasm of uncertain behavior of skin: Secondary | ICD-10-CM | POA: Diagnosis not present

## 2023-02-14 DIAGNOSIS — Z85828 Personal history of other malignant neoplasm of skin: Secondary | ICD-10-CM | POA: Diagnosis not present

## 2023-02-14 DIAGNOSIS — D225 Melanocytic nevi of trunk: Secondary | ICD-10-CM | POA: Diagnosis not present

## 2023-02-14 DIAGNOSIS — L821 Other seborrheic keratosis: Secondary | ICD-10-CM | POA: Diagnosis not present

## 2023-02-14 DIAGNOSIS — D2261 Melanocytic nevi of right upper limb, including shoulder: Secondary | ICD-10-CM | POA: Diagnosis not present

## 2023-02-14 DIAGNOSIS — L538 Other specified erythematous conditions: Secondary | ICD-10-CM | POA: Diagnosis not present

## 2023-02-14 DIAGNOSIS — D2262 Melanocytic nevi of left upper limb, including shoulder: Secondary | ICD-10-CM | POA: Diagnosis not present

## 2023-02-14 DIAGNOSIS — L820 Inflamed seborrheic keratosis: Secondary | ICD-10-CM | POA: Diagnosis not present

## 2023-03-10 ENCOUNTER — Emergency Department: Payer: HMO

## 2023-03-10 ENCOUNTER — Observation Stay
Admission: EM | Admit: 2023-03-10 | Discharge: 2023-03-16 | Disposition: A | Discharge status: Skilled Nursing Facility | Payer: HMO | Attending: Internal Medicine | Admitting: Internal Medicine

## 2023-03-10 ENCOUNTER — Other Ambulatory Visit: Payer: Self-pay

## 2023-03-10 DIAGNOSIS — Z87891 Personal history of nicotine dependence: Secondary | ICD-10-CM | POA: Diagnosis not present

## 2023-03-10 DIAGNOSIS — S3991XA Unspecified injury of abdomen, initial encounter: Secondary | ICD-10-CM | POA: Diagnosis not present

## 2023-03-10 DIAGNOSIS — N401 Enlarged prostate with lower urinary tract symptoms: Secondary | ICD-10-CM | POA: Diagnosis not present

## 2023-03-10 DIAGNOSIS — S0990XA Unspecified injury of head, initial encounter: Secondary | ICD-10-CM | POA: Diagnosis not present

## 2023-03-10 DIAGNOSIS — Z79899 Other long term (current) drug therapy: Secondary | ICD-10-CM | POA: Diagnosis not present

## 2023-03-10 DIAGNOSIS — I10 Essential (primary) hypertension: Secondary | ICD-10-CM | POA: Insufficient documentation

## 2023-03-10 DIAGNOSIS — F1021 Alcohol dependence, in remission: Secondary | ICD-10-CM

## 2023-03-10 DIAGNOSIS — I2510 Atherosclerotic heart disease of native coronary artery without angina pectoris: Secondary | ICD-10-CM | POA: Insufficient documentation

## 2023-03-10 DIAGNOSIS — Z96612 Presence of left artificial shoulder joint: Secondary | ICD-10-CM | POA: Diagnosis not present

## 2023-03-10 DIAGNOSIS — I480 Paroxysmal atrial fibrillation: Secondary | ICD-10-CM | POA: Diagnosis not present

## 2023-03-10 DIAGNOSIS — I4891 Unspecified atrial fibrillation: Secondary | ICD-10-CM

## 2023-03-10 DIAGNOSIS — Y92009 Unspecified place in unspecified non-institutional (private) residence as the place of occurrence of the external cause: Secondary | ICD-10-CM

## 2023-03-10 DIAGNOSIS — E860 Dehydration: Secondary | ICD-10-CM | POA: Diagnosis not present

## 2023-03-10 DIAGNOSIS — I4819 Other persistent atrial fibrillation: Secondary | ICD-10-CM | POA: Diagnosis present

## 2023-03-10 DIAGNOSIS — R079 Chest pain, unspecified: Secondary | ICD-10-CM | POA: Diagnosis not present

## 2023-03-10 DIAGNOSIS — S3993XA Unspecified injury of pelvis, initial encounter: Secondary | ICD-10-CM | POA: Diagnosis not present

## 2023-03-10 DIAGNOSIS — Z86718 Personal history of other venous thrombosis and embolism: Secondary | ICD-10-CM | POA: Diagnosis not present

## 2023-03-10 DIAGNOSIS — W19XXXA Unspecified fall, initial encounter: Principal | ICD-10-CM | POA: Insufficient documentation

## 2023-03-10 DIAGNOSIS — M419 Scoliosis, unspecified: Secondary | ICD-10-CM | POA: Diagnosis not present

## 2023-03-10 DIAGNOSIS — Z85828 Personal history of other malignant neoplasm of skin: Secondary | ICD-10-CM | POA: Diagnosis not present

## 2023-03-10 DIAGNOSIS — W19XXXD Unspecified fall, subsequent encounter: Secondary | ICD-10-CM | POA: Diagnosis not present

## 2023-03-10 DIAGNOSIS — S22080A Wedge compression fracture of T11-T12 vertebra, initial encounter for closed fracture: Secondary | ICD-10-CM | POA: Diagnosis not present

## 2023-03-10 DIAGNOSIS — S199XXA Unspecified injury of neck, initial encounter: Secondary | ICD-10-CM | POA: Diagnosis not present

## 2023-03-10 DIAGNOSIS — R0789 Other chest pain: Secondary | ICD-10-CM | POA: Diagnosis not present

## 2023-03-10 DIAGNOSIS — S299XXA Unspecified injury of thorax, initial encounter: Secondary | ICD-10-CM | POA: Diagnosis not present

## 2023-03-10 DIAGNOSIS — M545 Low back pain, unspecified: Secondary | ICD-10-CM | POA: Diagnosis not present

## 2023-03-10 DIAGNOSIS — M542 Cervicalgia: Secondary | ICD-10-CM | POA: Diagnosis not present

## 2023-03-10 DIAGNOSIS — E785 Hyperlipidemia, unspecified: Secondary | ICD-10-CM | POA: Diagnosis present

## 2023-03-10 DIAGNOSIS — F1020 Alcohol dependence, uncomplicated: Secondary | ICD-10-CM | POA: Diagnosis not present

## 2023-03-10 DIAGNOSIS — R519 Headache, unspecified: Secondary | ICD-10-CM | POA: Diagnosis not present

## 2023-03-10 DIAGNOSIS — M47816 Spondylosis without myelopathy or radiculopathy, lumbar region: Secondary | ICD-10-CM | POA: Diagnosis not present

## 2023-03-10 DIAGNOSIS — M549 Dorsalgia, unspecified: Secondary | ICD-10-CM | POA: Diagnosis not present

## 2023-03-10 DIAGNOSIS — M47814 Spondylosis without myelopathy or radiculopathy, thoracic region: Secondary | ICD-10-CM | POA: Diagnosis not present

## 2023-03-10 LAB — COMPREHENSIVE METABOLIC PANEL
ALT: 30 U/L (ref 0–44)
AST: 27 U/L (ref 15–41)
Albumin: 3.9 g/dL (ref 3.5–5.0)
Alkaline Phosphatase: 98 U/L (ref 38–126)
Anion gap: 12 (ref 5–15)
BUN: 21 mg/dL (ref 8–23)
CO2: 26 mmol/L (ref 22–32)
Calcium: 9 mg/dL (ref 8.9–10.3)
Chloride: 103 mmol/L (ref 98–111)
Creatinine, Ser: 0.64 mg/dL (ref 0.61–1.24)
GFR, Estimated: >60 mL/min (ref >60)
Glucose, Bld: 92 mg/dL (ref 70–99)
Potassium: 4.1 mmol/L (ref 3.5–5.1)
Sodium: 141 mmol/L (ref 135–145)
Total Bilirubin: 1 mg/dL (ref <1.2)
Total Protein: 7 g/dL (ref 6.5–8.1)

## 2023-03-10 LAB — TROPONIN I (HIGH SENSITIVITY): Troponin I (High Sensitivity): 7 ng/L (ref <18)

## 2023-03-10 LAB — URINALYSIS, W/ REFLEX TO CULTURE (INFECTION SUSPECTED)
Bacteria, UA: NONE SEEN
Bilirubin Urine: NEGATIVE
Glucose, UA: NEGATIVE mg/dL
Hgb urine dipstick: NEGATIVE
Ketones, ur: 5 mg/dL — AB
Leukocytes,Ua: NEGATIVE
Nitrite: NEGATIVE
Protein, ur: NEGATIVE mg/dL
Specific Gravity, Urine: 1.023 (ref 1.005–1.030)
pH: 5 (ref 5.0–8.0)

## 2023-03-10 LAB — CBC
HCT: 43.5 % (ref 39.0–52.0)
Hemoglobin: 14.4 g/dL (ref 13.0–17.0)
MCH: 32.4 pg (ref 26.0–34.0)
MCHC: 33.1 g/dL (ref 30.0–36.0)
MCV: 98 fL (ref 80.0–100.0)
Platelets: 237 10*3/uL (ref 150–400)
RBC: 4.44 MIL/uL (ref 4.22–5.81)
RDW: 13 % (ref 11.5–15.5)
WBC: 8.7 10*3/uL (ref 4.0–10.5)
nRBC: 0 % (ref 0.0–0.2)

## 2023-03-10 LAB — CK: Total CK: 60 U/L (ref 49–397)

## 2023-03-10 LAB — LIPASE, BLOOD: Lipase: 25 U/L (ref 11–51)

## 2023-03-10 MED ORDER — SALINE SPRAY 0.65 % NA SOLN: 1.0000 | NASAL | Status: DC | PRN

## 2023-03-10 MED ORDER — PANTOPRAZOLE SODIUM 40 MG PO TBEC
40.0000 mg | DELAYED_RELEASE_TABLET | Freq: Every day | ORAL | Status: DC
  Administered 2023-03-11 – 2023-03-16 (×6): 40 mg via ORAL
  Filled 2023-03-10 (×6): qty 1

## 2023-03-10 MED ORDER — OXYCODONE HCL 5 MG PO TABS
5.0000 mg | ORAL_TABLET | ORAL | Status: DC | PRN
  Administered 2023-03-11 – 2023-03-16 (×5): 5 mg via ORAL
  Filled 2023-03-10 (×7): qty 1

## 2023-03-10 MED ORDER — ONDANSETRON HCL 4 MG/2ML IJ SOLN: 4.0000 mg | Freq: Four times a day (QID) | INTRAMUSCULAR | Status: DC | PRN

## 2023-03-10 MED ORDER — DILTIAZEM HCL ER COATED BEADS 120 MG PO CP24
120.0000 mg | ORAL_CAPSULE | Freq: Every day | ORAL | Status: DC
  Administered 2023-03-11 – 2023-03-16 (×6): 120 mg via ORAL
  Filled 2023-03-10 (×7): qty 1

## 2023-03-10 MED ORDER — ACETAMINOPHEN 650 MG RE SUPP: 650.0000 mg | Freq: Four times a day (QID) | RECTAL | Status: DC | PRN

## 2023-03-10 MED ORDER — THIAMINE MONONITRATE 100 MG PO TABS
100.0000 mg | ORAL_TABLET | Freq: Every day | ORAL | Status: DC
  Administered 2023-03-11 – 2023-03-16 (×6): 100 mg via ORAL
  Filled 2023-03-10 (×6): qty 1

## 2023-03-10 MED ORDER — SODIUM CHLORIDE 0.9 % IV SOLN
INTRAVENOUS | Status: AC
Start: 2023-03-10 — End: 2023-03-11

## 2023-03-10 MED ORDER — FLUTICASONE PROPIONATE 50 MCG/ACT NA SUSP: 1.0000 | Freq: Every day | NASAL | Status: DC | PRN

## 2023-03-10 MED ORDER — ALBUTEROL SULFATE (2.5 MG/3ML) 0.083% IN NEBU: 2.5000 mg | INHALATION_SOLUTION | RESPIRATORY_TRACT | Status: DC | PRN

## 2023-03-10 MED ORDER — ENSURE ENLIVE PO LIQD
237.0000 mL | Freq: Two times a day (BID) | ORAL | Status: DC
  Administered 2023-03-12 – 2023-03-13 (×4): 237 mL via ORAL
  Administered 2023-03-14: 237 via ORAL
  Administered 2023-03-14 – 2023-03-16 (×5): 237 mL via ORAL

## 2023-03-10 MED ORDER — ONDANSETRON HCL 4 MG/2ML IJ SOLN
4.0000 mg | Freq: Once | INTRAMUSCULAR | Status: AC
  Administered 2023-03-10: 4 mg via INTRAVENOUS
  Filled 2023-03-10: qty 2

## 2023-03-10 MED ORDER — MORPHINE SULFATE (PF) 4 MG/ML IV SOLN
4.0000 mg | Freq: Once | INTRAVENOUS | Status: AC
  Administered 2023-03-10: 4 mg via INTRAVENOUS
  Filled 2023-03-10: qty 1

## 2023-03-10 MED ORDER — MORPHINE SULFATE (PF) 2 MG/ML IV SOLN: 2.0000 mg | INTRAVENOUS | Status: DC | PRN

## 2023-03-10 MED ORDER — ENOXAPARIN SODIUM 40 MG/0.4ML IJ SOSY
40.0000 mg | PREFILLED_SYRINGE | INTRAMUSCULAR | Status: DC
  Administered 2023-03-10 – 2023-03-14 (×3): 40 mg via SUBCUTANEOUS
  Filled 2023-03-10 (×5): qty 0.4

## 2023-03-10 MED ORDER — SODIUM CHLORIDE 0.9 % IV BOLUS
1000.0000 mL | Freq: Once | INTRAVENOUS | Status: AC
  Administered 2023-03-10: 1000 mL via INTRAVENOUS

## 2023-03-10 MED ORDER — FOLIC ACID 1 MG PO TABS
1.0000 mg | ORAL_TABLET | Freq: Every day | ORAL | Status: DC
Start: 2023-03-10 — End: 2023-03-16
  Administered 2023-03-10 – 2023-03-16 (×6): 1 mg via ORAL
  Filled 2023-03-10 (×6): qty 1

## 2023-03-10 MED ORDER — VENLAFAXINE HCL ER 75 MG PO CP24
75.0000 mg | ORAL_CAPSULE | Freq: Every day | ORAL | Status: DC
  Administered 2023-03-11 – 2023-03-16 (×6): 75 mg via ORAL
  Filled 2023-03-10 (×7): qty 1

## 2023-03-10 MED ORDER — IOHEXOL 300 MG/ML  SOLN
80.0000 mL | Freq: Once | INTRAMUSCULAR | Status: AC | PRN
  Administered 2023-03-10: 80 mL via INTRAVENOUS

## 2023-03-10 MED ORDER — DILTIAZEM HCL 60 MG PO TABS: 30.0000 mg | ORAL_TABLET | Freq: Two times a day (BID) | ORAL | Status: DC

## 2023-03-10 MED ORDER — ONDANSETRON HCL 4 MG PO TABS: 4.0000 mg | ORAL_TABLET | Freq: Four times a day (QID) | ORAL | Status: DC | PRN

## 2023-03-10 MED ORDER — TIMOLOL MALEATE 0.5 % OP SOLN
1.0000 [drp] | Freq: Every day | OPHTHALMIC | Status: DC
  Administered 2023-03-11 – 2023-03-16 (×6): 1 [drp] via OPHTHALMIC
  Filled 2023-03-10 (×2): qty 5

## 2023-03-10 MED ORDER — LORAZEPAM 2 MG/ML IJ SOLN: 1.0000 mg | INTRAMUSCULAR | Status: DC | PRN

## 2023-03-10 MED ORDER — METHOCARBAMOL 500 MG PO TABS
500.0000 mg | ORAL_TABLET | Freq: Three times a day (TID) | ORAL | Status: DC
  Administered 2023-03-10 – 2023-03-16 (×16): 500 mg via ORAL
  Filled 2023-03-10 (×16): qty 1

## 2023-03-10 MED ORDER — THIAMINE HCL 100 MG/ML IJ SOLN: 100.0000 mg | Freq: Every day | INTRAMUSCULAR | Status: DC

## 2023-03-10 MED ORDER — DILTIAZEM HCL ER COATED BEADS 120 MG PO CP24
120.0000 mg | ORAL_CAPSULE | Freq: Every day | ORAL | Status: DC
  Filled 2023-03-10: qty 1

## 2023-03-10 MED ORDER — POTASSIUM CHLORIDE ER 10 MEQ PO TBCR
10.0000 meq | EXTENDED_RELEASE_TABLET | Freq: Every day | ORAL | Status: DC
  Administered 2023-03-11 – 2023-03-16 (×6): 10 meq via ORAL
  Filled 2023-03-10 (×11): qty 1

## 2023-03-10 MED ORDER — KETOROLAC TROMETHAMINE 30 MG/ML IJ SOLN
15.0000 mg | Freq: Once | INTRAMUSCULAR | Status: AC
  Administered 2023-03-10: 15 mg via INTRAVENOUS
  Filled 2023-03-10: qty 1

## 2023-03-10 MED ORDER — ADULT MULTIVITAMIN W/MINERALS CH
1.0000 | ORAL_TABLET | Freq: Every day | ORAL | Status: DC
  Administered 2023-03-10 – 2023-03-16 (×7): 1 via ORAL
  Filled 2023-03-10 (×7): qty 1

## 2023-03-10 MED ORDER — LORATADINE 10 MG PO TABS: 10.0000 mg | ORAL_TABLET | Freq: Every day | ORAL | Status: DC | PRN

## 2023-03-10 MED ORDER — TAMSULOSIN HCL 0.4 MG PO CAPS: 0.4000 mg | ORAL_CAPSULE | Freq: Every day | ORAL | Status: DC

## 2023-03-10 MED ORDER — DILTIAZEM HCL 60 MG PO TABS
30.0000 mg | ORAL_TABLET | Freq: Once | ORAL | Status: AC
  Administered 2023-03-10: 30 mg via ORAL
  Filled 2023-03-10: qty 1

## 2023-03-10 MED ORDER — POLYETHYLENE GLYCOL 3350 17 G PO PACK
17.0000 g | PACK | Freq: Every day | ORAL | Status: DC
  Administered 2023-03-11 – 2023-03-14 (×4): 17 g via ORAL
  Filled 2023-03-10 (×6): qty 1

## 2023-03-10 MED ORDER — THIAMINE HCL 100 MG/ML IJ SOLN
100.0000 mg | Freq: Once | INTRAMUSCULAR | Status: AC
  Administered 2023-03-10: 100 mg via INTRAVENOUS
  Filled 2023-03-10: qty 2

## 2023-03-10 MED ORDER — THIAMINE MONONITRATE 100 MG PO TABS: 100.0000 mg | ORAL_TABLET | Freq: Every day | ORAL | Status: DC

## 2023-03-10 MED ORDER — HYDROCODONE-ACETAMINOPHEN 5-325 MG PO TABS
1.0000 | ORAL_TABLET | Freq: Once | ORAL | Status: AC
  Administered 2023-03-10: 1 via ORAL
  Filled 2023-03-10: qty 1

## 2023-03-10 MED ORDER — LORAZEPAM 1 MG PO TABS: 1.0000 mg | ORAL_TABLET | ORAL | Status: DC | PRN

## 2023-03-10 MED ORDER — ACETAMINOPHEN 325 MG PO TABS
650.0000 mg | ORAL_TABLET | Freq: Four times a day (QID) | ORAL | Status: DC | PRN
Start: 2023-03-10 — End: 2023-03-16
  Administered 2023-03-14: 650 mg via ORAL
  Filled 2023-03-10: qty 2

## 2023-03-10 NOTE — ED Triage Notes (Signed)
Pt c/o left flank pain and mid back pain following a fall last night. Pt states he does not remember falling, however he woke up in the floor at about 2am after getting out of bed to go get something to drink.

## 2023-03-10 NOTE — ED Provider Notes (Signed)
[facility] Provider Note    Event Date/Time   First MD Initiated Contact with Patient 03/10/23 1116     (approximate)   History   Fall   HPI  DALE ZBOROWSKI is a 82 y.o. male here with fall.  The patient  [facility] Provider Note    Event Date/Time   First MD Initiated Contact with Patient 03/10/23 1116     (approximate)   History   Fall   HPI  NEREO SHOBERT is a 82 y.o. male here with fall.  The patient states that he woke up at around 2 AM on the ground in his house.  He believes that he had gotten up just prior to this and walked to get something from the fridge.  His wife came and found him at around 5 AM on the ground.  Is unable to get himself up.  He does note that he recently resumed drinking several days ago, after being dry for several months.  He has a history of alcoholism.  Drinking more than usual yesterday.  He states he currently has some mild neck pain, left flank pain, and left chest pain that is worse with deep inspiration.  He has a history of pneumonia and sepsis.  Denies any illness prior to the fall.  No focal numbness or weakness.     Physical Exam   Triage Vital Signs: ED Triage Vitals  Encounter Vitals Group     BP 03/10/23 1020 119/86     Systolic BP Percentile --      Diastolic BP Percentile --      Pulse Rate 03/10/23 1020 (!) 124     Resp 03/10/23 1020 20     Temp 03/10/23 1020 98.4 F (36.9 C)     Temp Source 03/10/23 1020 Oral     SpO2 03/10/23 1020 93 %     Weight 03/10/23 1021 170 lb (77.1 kg)     Height 03/10/23 1021 5\' 6"  (1.676 m)     Head Circumference --      Peak Flow --      Pain Score 03/10/23 1021 7     Pain Loc --      Pain Education --      Exclude from Growth Chart --     Most recent vital signs: Vitals:   03/10/23 1547 03/10/23 1551  BP:  (!) 156/90  Pulse:    Resp:    Temp: 97.8 F (36.6 C)   SpO2:       General: Awake, no distress.  CV:  Good  peripheral perfusion.  Regular rate and rhythm. Resp:  Normal work of breathing.  Significant tenderness to palpation over the left lateral chest wall.  No bruising or deformity. Abd:  No distention.  Mild tenderness left upper quadrant. Other:  Mild tenderness left paraspinal cervical area.  Strength out of 5 bilateral from lower extremities are normal station light touch.  Cranial nerves intact.   ED Results / Procedures / Treatments   Labs (all labs ordered are listed, but only abnormal results are displayed) Labs Reviewed  URINALYSIS, W/ REFLEX TO CULTURE (INFECTION SUSPECTED) - Abnormal; Notable for the following components:      Result Value   Color, Urine YELLOW (*)    APPearance CLEAR (*)    Ketones, ur 5 (*)    All other components within normal limits  CBC  COMPREHENSIVE METABOLIC PANEL  LIPASE, BLOOD  CK  TROPONIN I (HIGH SENSITIVITY)     EKG Atrial fibrillation, ventricular rate 116.  QRS 76, QTc 464.  Nonspecific ST changes.  No acute ST elevations or depressions.   RADIOLOGY CT head: No acute intracranial abnormality CT C-spine: No acute cervical fracture CT T-spine: No acute displaced fracture, chronic degenerative changes noted CT L-spine: No acute displaced fracture CT chest/abdomen/pelvis:No acute abnormality, question developing fluid dense lesion along the pancreatic neck, recommend outpatient MRI   I also independently reviewed and agree with radiologist interpretations.   PROCEDURES:  Critical Care performed: No   MEDICATIONS ORDERED IN ED: Medications  thiamine (VITAMIN B1) injection 100 mg (has no administration in time range)  sodium chloride 0.9 % bolus 1,000 mL (1,000 mLs Intravenous New Bag/Given 03/10/23 1239)  iohexol (OMNIPAQUE) 300 MG/ML solution 80 mL (80 mLs Intravenous Contrast Given 03/10/23 1252)  morphine (PF) 4 MG/ML injection 4 mg (4 mg Intravenous Given 03/10/23 1344)  ondansetron (ZOFRAN) injection 4 mg (4 mg Intravenous  Given 03/10/23 1343)  diltiazem (CARDIZEM) tablet 30 mg (30 mg Oral Given 03/10/23 1551)  ketorolac (TORADOL) 30 MG/ML injection 15 mg (15 mg Intravenous Given 03/10/23 1545)  HYDROcodone-acetaminophen (NORCO/VICODIN) 5-325 MG per tablet 1 tablet (1 tablet Oral Given 03/10/23 1557)     IMPRESSION / MDM / ASSESSMENT AND PLAN / ED COURSE  I reviewed the triage vital signs and the nursing notes.                              Differential diagnosis includes, but is not limited to, mechanical fall, fall secondary to intoxication, anemia, dehydration, orthostasis, occult sepsis or infection  Patient's presentation is most consistent with acute presentation with potential threat to life or bodily function.  The patient is on the cardiac monitor to evaluate for evidence of arrhythmia and/or significant heart rate changes   82 year old male here with left chest pain, weakness, fall.  This occurs in the setting of recent increase in alcohol use.  Patient does appear significantly weak.  Lab work shows mild dehydration, otherwise normal CBC with no anemia or leukocytosis.  CMP is unremarkable.  Lipase normal.  CK negative.  UA consistent with acute ketonuria likely from dehydration.  Patient given IV fluids.  Broad imaging obtained given his age and tenderness, which fortunately shows no acute abnormality noted on full CT imaging.  The patient does have chronic spine disease but no acute fractures.  No focal numbness or weakness.  Patient noted to be persistently in mild A-fib RVR for which she was given a p.o. diltiazem.  Is given fluids and analgesia.  Following this, he remained in significant pain and generally weak with inability to ambulate.  Will admit for additional control of his A-fib and generalized weakness with inability to ambulate in the setting of fall and recent increase in alcohol use.  Thiamine given.   FINAL CLINICAL IMPRESSION(S) / ED DIAGNOSES   Final diagnoses:  Fall, initial  encounter  Dehydration  Atrial fibrillation with rapid ventricular response (HCC)  Left-sided chest pain  H/O alcohol dependence (HCC)     Rx / DC Orders   ED Discharge Orders     None        Note:  This document was prepared using Dragon voice recognition software and may include unintentional dictation errors.      Shaune Pollack, MD 03/10/23 [phone]

## 2023-03-10 NOTE — ED Notes (Signed)
First nurse note: From home, [facility] Pt fell this AM around 0500, found on floor around 0830 by family (went to sleep on the floor until wife woke up). Pt is not sure how he fell but told EMS he thinks he tripped. Pt complains of lower back pain, hx of same. Pt requests xray. VSS: 132/72, 110HR, 18RR, 98% RA

## 2023-03-10 NOTE — H&P (Signed)
History and Physical    JAQAI VIRGILIO ZOX:[id number] DOB: [birth year] DOA: 03/10/2023  PCP: [name], MD  Patient coming from: Home  I have personally briefly reviewed patient's old medical records in [facility]  Chief Complaint: Fall  HPI: Marcus Coleman is a 82 y.o. male with medical history significant of atrial fibrillation, not on anticoagulation, GERD, depression, glaucoma, alcohol use disorder who presents for evaluation after a fall.  Unknown time down.  Patient states that he woke up approximately 2 AM on the day of presentation on the ground in his house.  Wife came and found him at 5 AM on the ground.  Unable to get himself up.  Of note patient has a history of alcoholism and relapsed Avril days ago after being dry for several months.  On presentation patient is hemodynamically stable.  Laboratory investigation reveals mild dehydration.  Extensive CT imaging survey negative for fracture.  Patient has significant rib pain likely as a result of a contusion.  No previous history of recent illness.  No focal numbness or weakness to suggest neurologic injury.  ED Course: Patient was given pain medication and attempted ambulation several times.  He was unable to do so.  Hospitalist contacted for admission.  Review of Systems: As per HPI otherwise 14 point review of systems negative.   Past Medical History:  Diagnosis Date   Aortic atherosclerosis (HCC)    Atrial fibrillation (HCC)    a.) CHA2DS2VASc = 6 (age x2, HTN, DVT x2, vascular disease history);  b.) rate/rhythm maintained on oral diltiazem + metoprolol succinate; chronic anticoagulation (apixaban) discontinued due to GI bleeding   BPH with obstruction/lower urinary tract symptoms    Bronchitis 01/27/2022   CAD (coronary artery disease)    Cardiomegaly    Chronic diarrhea    Compression fracture of thoracic vertebra (HCC)    a.) CT renal 12/30/2021: chronic appearing T11-T12 (70% height loss)   DDD  (degenerative disc disease), cervical    Diverticulosis    DVT (deep venous thrombosis) (HCC) 03/31/2019   a.) anticoagulation intiated (apixaban), however subsequently discontinued due to GI bleeding   ETOH abuse    GI bleed    Heart murmur    Hiatal hernia    Hyperlipidemia    Hypertension    IDA (iron deficiency anemia)    Low testosterone    Nephrolithiasis    Pneumonia    Rotator cuff arthropathy, left    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    Vasovagal syncope     Past Surgical History:  Procedure Laterality Date   boils removed from right forearm  1968   CARDIAC CATHETERIZATION     CATARACT EXTRACTION Left 2008   catracts Bilateral    left 2008, right 2013   CHOLECYSTECTOMY     COLONOSCOPY WITH PROPOFOL N/A 06/26/2016   Procedure: COLONOSCOPY WITH PROPOFOL;  Surgeon: Scot Jun, MD;  Location: [facility] ENDOSCOPY;  Service: Endoscopy;  Laterality: N/A;   COLONOSCOPY WITH PROPOFOL N/A 07/03/2019   Procedure: COLONOSCOPY WITH PROPOFOL;  Surgeon: Wyline Mood, MD;  Location: [facility] ENDOSCOPY;  Service: Gastroenterology;  Laterality: N/A;   ENDOSCOPIC RETROGRADE CHOLANGIOPANCREATOGRAPHY (ERCP) WITH PROPOFOL     ENDOSCOPIC VEIN LASER TREATMENT     right leg 05/2015, left leg 09/2015   ESOPHAGOGASTRODUODENOSCOPY N/A 04/19/2019   Procedure: ESOPHAGOGASTRODUODENOSCOPY (EGD);  Surgeon: Wyline Mood, MD;  Location: [facility] ENDOSCOPY;  Service: Gastroenterology;  Laterality: N/A;   ESOPHAGOGASTRODUODENOSCOPY (EGD) WITH PROPOFOL N/A 07/03/2019  Procedure: ESOPHAGOGASTRODUODENOSCOPY (EGD) WITH PROPOFOL;  Surgeon: Wyline Mood, MD;  Location: [facility] ENDOSCOPY;  Service: Gastroenterology;  Laterality: N/A;   ESOPHAGOGASTRODUODENOSCOPY (EGD) WITH PROPOFOL N/A 08/07/2019   Procedure: ESOPHAGOGASTRODUODENOSCOPY (EGD) WITH PROPOFOL;  Surgeon: Rachael Fee, MD;  Location: [facility] ENDOSCOPY;  Service: Endoscopy;  Laterality: N/A;   EUS N/A 08/07/2019   Procedure: UPPER ENDOSCOPIC ULTRASOUND (EUS)  RADIAL;  Surgeon: Rachael Fee, MD;  Location: [facility] ENDOSCOPY;  Service: Endoscopy;  Laterality: N/A;   eyelid surgery     FOOT SURGERY Right 2009   HEMORROIDECTOMY     HERNIA REPAIR     mrercp  2014   NOSE SURGERY  2011   REVERSE SHOULDER ARTHROPLASTY Left 02/09/2022   Procedure: REVERSE SHOULDER ARTHROPLASTY AND BICEPS TENODESIS.;  Surgeon: Christena Flake, MD;  Location: [facility] ORS;  Service: Orthopedics;  Laterality: Left;   RHINOPLASTY     squamous cell carcinoma removed from right hand  2013   TONSILLECTOMY     WISDOM TOOTH EXTRACTION       reports that he quit smoking about 36 years ago. His smoking use included cigarettes. He started smoking about 61 years ago. He has a 25 pack-year smoking history. He has never used smokeless tobacco. He reports current alcohol use of about 14.0 standard drinks of alcohol per week. He reports that he does not use drugs.  Allergies  Allergen Reactions   Nsaids     Can take Ibuprofen in moderation/high doses or prolonged use causes GI bleed    Family History  Problem Relation Age of Onset   Hypothyroidism Mother    Diabetes Mother    Lung cancer Mother      Prior to Admission medications   Medication Sig Start Date End Date Taking? Authorizing Provider  acetaminophen (TYLENOL) 650 MG CR tablet Take 650 mg by mouth every 8 (eight) hours as needed for pain.    [provider]  cetirizine (ZYRTEC) 10 MG tablet Take 10 mg by mouth every morning.    [provider]  clindamycin (CLEOCIN T) 1 % external solution Apply 1 % topically as needed. (Apply to head)    [provider]  desvenlafaxine (PRISTIQ) 50 MG 24 hr tablet Take 1 tablet by mouth daily. 09/26/22 09/26/23  [provider]  diltiazem (CARDIZEM) 30 MG tablet Take 1 tablet (30 mg total) by mouth every 12 (twelve) hours. 02/14/22   Sunnie Nielsen, DO  feeding supplement (ENSURE ENLIVE / ENSURE PLUS) LIQD Take 237 mLs by mouth 2 (two) times daily  between meals. 07/21/22   Alford Highland, MD  fluticasone (FLONASE) 50 MCG/ACT nasal spray Place 1 spray into both nostrils daily as needed for allergies or rhinitis. 07/21/22   Wieting, Richard, MD  lidocaine (LIDODERM) 5 % Place 1 patch onto the skin daily. Remove & Discard patch within 12 hours or as directed by MD 07/21/22   Alford Highland, MD  Multiple Vitamin (MULTIVITAMIN WITH MINERALS) TABS tablet Take 1 tablet by mouth daily.    [provider]  nystatin cream (MYCOSTATIN) Apply topically 2 (two) times daily. 07/21/22   Alford Highland, MD  pantoprazole (PROTONIX) 40 MG tablet Take 1 tablet (40 mg total) by mouth daily. 02/15/22   Sunnie Nielsen, DO  polyethylene glycol (MIRALAX / GLYCOLAX) 17 g packet Take 17 g by mouth daily.    [provider]  potassium chloride (KLOR-CON) 10 MEQ tablet Take 10 mEq by mouth daily.    [provider]  sodium chloride (  OCEAN) 0.65 % SOLN nasal spray Place 1 spray into both nostrils as needed for congestion. 02/14/22   Sunnie Nielsen, DO  tamsulosin (FLOMAX) 0.4 MG CAPS capsule Take 0.4 mg by mouth.    [provider]  timolol (TIMOPTIC) 0.5 % ophthalmic solution Place 1 drop into both eyes daily.     [provider]  Vibegron (GEMTESA) 75 MG TABS Take 1 tablet (75 mg total) by mouth daily. Patient not taking: Reported on 01/11/2023 11/17/22   Riki Altes, MD    Physical Exam: Vitals:   03/10/23 1021 03/10/23 1433 03/10/23 1547 03/10/23 1551  BP:  (!) 144/85  (!) 156/90  Pulse:  (!) 110    Resp:  18    Temp:   97.8 F (36.6 C)   TempSrc:   Oral   SpO2:  99%    Weight: 77.1 kg     Height: 5\' 6"  (1.676 m)       Vitals:   03/10/23 1021 03/10/23 1433 03/10/23 1547 03/10/23 1551  BP:  (!) 144/85  (!) 156/90  Pulse:  (!) 110    Resp:  18    Temp:   97.8 F (36.6 C)   TempSrc:   Oral   SpO2:  99%    Weight: 77.1 kg     Height: 5\' 6"  (1.676 m)      General: No apparent distress, patient  appears well HEENT: Normocephalic, atraumatic Neck, supple, trachea midline, no tenderness Heart: S1-S2, regular rate, irregular rhythm, no murmurs, no pedal edema Lungs: Bibasilar crackles.  Normal work of breathing.  1 L Abdomen: Soft, nontender, nondistended, positive bowel sounds Extremities: Normal, atraumatic, decreased ROM Skin: Pale and thin with no obvious rashes or lesions atrial fibrillation with controlled ventricular response Neurologic: Cranial nerves grossly intact, sensation intact, alert and oriented x3 Psychiatric: Normal affect   Labs on Admission: I have personally reviewed following labs and imaging studies  CBC: Recent Labs  Lab 03/10/23 1132  WBC 8.7  HGB 14.4  HCT 43.5  MCV 98.0  PLT 237   Basic Metabolic Panel: Recent Labs  Lab 03/10/23 1132  NA 141  K 4.1  CL 103  CO2 26  GLUCOSE 92  BUN 21  CREATININE 0.64  CALCIUM 9.0   GFR: Estimated Creatinine Clearance: 72 mL/min (by C-G formula based on SCr of 0.64 mg/dL). Liver Function Tests: Recent Labs  Lab 03/10/23 1132  AST 27  ALT 30  ALKPHOS 98  BILITOT 1.0  PROT 7.0  ALBUMIN 3.9   Recent Labs  Lab 03/10/23 1132  LIPASE 25   No results for input(s): "AMMONIA" in the last 168 hours. Coagulation Profile: No results for input(s): "INR", "PROTIME" in the last 168 hours. Cardiac Enzymes: Recent Labs  Lab 03/10/23 1132  CKTOTAL 60   BNP (last 3 results) No results for input(s): "PROBNP" in the last 8760 hours. HbA1C: No results for input(s): "HGBA1C" in the last 72 hours. CBG: No results for input(s): "GLUCAP" in the last 168 hours. Lipid Profile: No results for input(s): "CHOL", "HDL", "LDLCALC", "TRIG", "CHOLHDL", "LDLDIRECT" in the last 72 hours. Thyroid Function Tests: No results for input(s): "TSH", "T4TOTAL", "FREET4", "T3FREE", "THYROIDAB" in the last 72 hours. Anemia Panel: No results for input(s): "VITAMINB12", "FOLATE", "FERRITIN", "TIBC", "IRON", "RETICCTPCT" in  the last 72 hours. Urine analysis:    Component Value Date/Time   COLORURINE YELLOW (A) 03/10/2023 1132   APPEARANCEUR CLEAR (A) 03/10/2023 1132   APPEARANCEUR Clear 01/13/2022 0905  LABSPEC 1.023 03/10/2023 1132   PHURINE 5.0 03/10/2023 1132   GLUCOSEU NEGATIVE 03/10/2023 1132   HGBUR NEGATIVE 03/10/2023 1132   BILIRUBINUR NEGATIVE 03/10/2023 1132   BILIRUBINUR Negative 01/13/2022 0905   KETONESUR 5 (A) 03/10/2023 1132   PROTEINUR NEGATIVE 03/10/2023 1132   NITRITE NEGATIVE 03/10/2023 1132   LEUKOCYTESUR NEGATIVE 03/10/2023 1132    Radiological Exams on Admission: CT CHEST ABDOMEN PELVIS W CONTRAST Result Date: 03/10/2023 CLINICAL DATA:  Polytrauma, blunt left flank pain and mid back pain following a fall last night. Pt states he does not remember falling, however he woke up in the floor at about 2am after getting out of bed to go get something to drink. EXAM: CT CHEST, ABDOMEN, AND PELVIS WITH CONTRAST TECHNIQUE: Multidetector CT imaging of the chest, abdomen and pelvis was performed following the standard protocol during bolus administration of intravenous contrast. RADIATION DOSE REDUCTION: This exam was performed according to the departmental dose-optimization program which includes automated exposure control, adjustment of the mA and/or kV according to patient size and/or use of iterative reconstruction technique. CONTRAST:  80mL OMNIPAQUE IOHEXOL 300 MG/ML  SOLN COMPARISON:  None Available. FINDINGS: CHEST: Cardiovascular: No aortic injury. The thoracic aorta is normal in caliber. Enlarged right heart. No significant pericardial effusion. Coronary artery calcification. The main pulmonary artery is normal in caliber. No central pulmonary embolus. Limited evaluation more distally due to timing of contrast. Mediastinum/Nodes: No pneumomediastinum. No mediastinal hematoma. The esophagus is unremarkable. Large hiatal hernia containing the majority of the stomach. The thyroid is unremarkable.  The central airways are patent. No mediastinal, hilar, or axillary lymphadenopathy. Lungs/Pleura: No focal consolidation. No pulmonary nodule. No pulmonary mass. No pulmonary contusion or laceration. No pneumatocele formation. No pleural effusion. No pneumothorax. No hemothorax. Musculoskeletal/Chest wall: No chest wall mass. No acute rib or sternal fracture. Please see separately dictated CT thoracolumbar spine. Total left shoulder arthroplasty. Degenerative changes of the right shoulder. ABDOMEN / PELVIS: Hepatobiliary: Not enlarged. Fluid density lesion of the right hepatic lobe likely a simple hepatic cyst. Grossly similar-appearing subcentimeter hypodensities too small to characterize. No laceration or subcapsular hematoma. The gallbladder is otherwise unremarkable with no radio-opaque gallstones. No biliary ductal dilatation. Pancreas: Diffusely atrophic. Question developing fluid dense lesion along the pancreatic neck/proximal body (2:62). Otherwise normal pancreatic contour. No main pancreatic duct dilatation. Spleen: Not enlarged. No focal lesion. No laceration, subcapsular hematoma, or vascular injury. Adrenals/Urinary Tract: No nodularity bilaterally. Bilateral kidneys enhance symmetrically. Redemonstration of an 8 mm calcification within the left kidney. Simple renal cysts within left kidney likely represent simple renal cysts. Simple renal cysts, in the absence of clinically indicated signs/symptoms, require no independent follow-up. No hydronephrosis. No contusion, laceration, or subcapsular hematoma. No injury to the vascular structures or collecting systems. No hydroureter. The urinary bladder is unremarkable. Stomach/Bowel: No small or large bowel wall thickening or dilatation. The appendix is unremarkable. Vasculature/Lymphatics: Severe atherosclerotic plaque. No abdominal aorta or iliac aneurysm. No active contrast extravasation or pseudoaneurysm. No abdominal, pelvic, inguinal lymphadenopathy.  Reproductive: Prostate is unremarkable. Other: No simple free fluid ascites. No pneumoperitoneum. No hemoperitoneum. No mesenteric hematoma identified. No organized fluid collection. Musculoskeletal: No significant soft tissue hematoma. No acute pelvic fracture. Please see separately dictated CT thoracolumbar spine. Ports and Devices: None. IMPRESSION: 1. No acute intrathoracic, intra-abdominal, intrapelvic traumatic injury. 2. Please see separately dictated CT thoracolumbar spine. Other imaging findings of potential clinical significance: 1. Question developing fluid dense lesion along the pancreatic neck/proximal body-recommend outpatient MR pancreatic protocol further evaluation. 2.  Right cardiomegaly. 3. Large hiatal hernia containing the majority of the stomach. 4. Nonobstructive 8 mm left nephrolithiasis. 5. Aortic Atherosclerosis (ICD10-I70.0). Electronically Signed   By: Tish Frederickson M.D.   On: 03/10/2023 14:29   CT T-SPINE NO CHARGE Result Date: 03/10/2023 CLINICAL DATA:  left flank pain and mid back pain following a fall last night. Pt states he does not remember falling, however he woke up in the floor at about 2am after getting out of bed to go get something to drink. EXAM: CT THORACIC AND LUMBAR SPINE WITHOUT CONTRAST TECHNIQUE: Multidetector CT imaging of the thoracic and lumbar spine was performed without contrast. Multiplanar CT image reconstructions were also generated. RADIATION DOSE REDUCTION: This exam was performed according to the departmental dose-optimization program which includes automated exposure control, adjustment of the mA and/or kV according to patient size and/or use of iterative reconstruction technique. COMPARISON:  MRI thoracic spine 07/20/2022, CT abdomen pelvis the 12/30/2021 FINDINGS: CT THORACIC SPINE FINDINGS Alignment: Similar-appearing compensatory dextroscoliosis of the lumbar spine centered at the T11 level. Vertebrae: Diffusely decreased bone density.  Similar-appearing chronic T12 anterior wedge compression fracture. Similar-appearing mild anterior wedge compression deformity of the T11 level. Interval increase in degenerative changes along the T10-T11 level. No acute fracture or focal pathologic process. No severe osseous neural foraminal or central canal stenosis. Paraspinal and other soft tissues: Negative. Disc levels: Multilevel mild degenerative changes of the spine. CT LUMBAR SPINE FINDINGS Segmentation: 5 lumbar type vertebrae. Alignment: Similar-appearing levoscoliosis of the lumbar spine centered at the L2-L3 level. Vertebrae: Diffusely decreased bone density. Similar-appearing fusion at the L1-L2 levels with marked decreased density but no acute fracture associated. Multilevel severe degenerative changes of the spine. No acute fracture or focal pathologic process. No severe osseous neural foraminal or central canal stenosis. Paraspinal and other soft tissues: Negative. Disc levels: Multilevel intervertebral disc space vacuum phenomenon. IMPRESSION: CT THORACIC SPINE IMPRESSION 1. No acute displaced fracture or traumatic listhesis of the thoracic spine. 2. Interval increase in T10-T11 degenerative changes in a patient with dextroscoliosis of the thoracolumbar spine centered at the T11 level and chronic similar-appearing anterior wedge compression deformity of the T11 level and anterior wedge compression fracture of the T12 level. 3. Marked diffusely decreased bone density. CT LUMBAR SPINE IMPRESSION 1. No acute displaced fracture or traumatic listhesis of the lumbar spine. 2. Marked diffusely decreased bone density. Electronically Signed   By: Tish Frederickson M.D.   On: 03/10/2023 14:07   CT L-SPINE NO CHARGE Result Date: 03/10/2023 CLINICAL DATA:  left flank pain and mid back pain following a fall last night. Pt states he does not remember falling, however he woke up in the floor at about 2am after getting out of bed to go get something to drink.  EXAM: CT THORACIC AND LUMBAR SPINE WITHOUT CONTRAST TECHNIQUE: Multidetector CT imaging of the thoracic and lumbar spine was performed without contrast. Multiplanar CT image reconstructions were also generated. RADIATION DOSE REDUCTION: This exam was performed according to the departmental dose-optimization program which includes automated exposure control, adjustment of the mA and/or kV according to patient size and/or use of iterative reconstruction technique. COMPARISON:  MRI thoracic spine 07/20/2022, CT abdomen pelvis the 12/30/2021 FINDINGS: CT THORACIC SPINE FINDINGS Alignment: Similar-appearing compensatory dextroscoliosis of the lumbar spine centered at the T11 level. Vertebrae: Diffusely decreased bone density. Similar-appearing chronic T12 anterior wedge compression fracture. Similar-appearing mild anterior wedge compression deformity of the T11 level. Interval increase in degenerative changes along the T10-T11 level. No acute fracture  or focal pathologic process. No severe osseous neural foraminal or central canal stenosis. Paraspinal and other soft tissues: Negative. Disc levels: Multilevel mild degenerative changes of the spine. CT LUMBAR SPINE FINDINGS Segmentation: 5 lumbar type vertebrae. Alignment: Similar-appearing levoscoliosis of the lumbar spine centered at the L2-L3 level. Vertebrae: Diffusely decreased bone density. Similar-appearing fusion at the L1-L2 levels with marked decreased density but no acute fracture associated. Multilevel severe degenerative changes of the spine. No acute fracture or focal pathologic process. No severe osseous neural foraminal or central canal stenosis. Paraspinal and other soft tissues: Negative. Disc levels: Multilevel intervertebral disc space vacuum phenomenon. IMPRESSION: CT THORACIC SPINE IMPRESSION 1. No acute displaced fracture or traumatic listhesis of the thoracic spine. 2. Interval increase in T10-T11 degenerative changes in a patient with  dextroscoliosis of the thoracolumbar spine centered at the T11 level and chronic similar-appearing anterior wedge compression deformity of the T11 level and anterior wedge compression fracture of the T12 level. 3. Marked diffusely decreased bone density. CT LUMBAR SPINE IMPRESSION 1. No acute displaced fracture or traumatic listhesis of the lumbar spine. 2. Marked diffusely decreased bone density. Electronically Signed   By: Tish Frederickson M.D.   On: 03/10/2023 14:07   CT Head Wo Contrast Result Date: 03/10/2023 CLINICAL DATA:  Head trauma, minor (Age >= 65y); Neck trauma (Age >= 65y) Left flank pain and mid back pain following a fall last night. Pt states he does not remember falling, however he woke up in the floor at about 2am after getting out of bed to go get something to drink. EXAM: CT HEAD WITHOUT CONTRAST CT CERVICAL SPINE WITHOUT CONTRAST TECHNIQUE: Multidetector CT imaging of the head and cervical spine was performed following the standard protocol without intravenous contrast. Multiplanar CT image reconstructions of the cervical spine were also generated. RADIATION DOSE REDUCTION: This exam was performed according to the departmental dose-optimization program which includes automated exposure control, adjustment of the mA and/or kV according to patient size and/or use of iterative reconstruction technique. COMPARISON:  CT head and C-spine 07/15/2022 FINDINGS: CT HEAD FINDINGS Brain: No evidence of large-territorial acute infarction. No parenchymal hemorrhage. No mass lesion. No extra-axial collection. No mass effect or midline shift. No hydrocephalus. Basilar cisterns are patent. Vascular: No hyperdense vessel. Atherosclerotic calcifications are present within the cavernous internal carotid arteries. Skull: No acute fracture or focal lesion. Sinuses/Orbits: Paranasal sinuses and mastoid air cells are clear. Bilateral lens replacement. Otherwise the orbits are unremarkable. Other: None. CT CERVICAL  SPINE FINDINGS Alignment: Normal. Skull base and vertebrae: Diffusely decreased bone density. Multilevel severe degenerative changes of the spine. Associated severe osseous neural foraminal stenosis at the right C3-C4 level. No acute fracture. No aggressive appearing focal osseous lesion or focal pathologic process. Soft tissues and spinal canal: No prevertebral fluid or swelling. No visible canal hematoma. Upper chest: Unremarkable. Other: Atherosclerotic plaque of the aorta and branch. IMPRESSION: 1. No acute intracranial abnormality. 2. No acute displaced fracture or traumatic listhesis of the cervical spine. 3. Multilevel severe degenerative changes of the spine. Associated severe osseous neural foraminal stenosis at the right C3-C4 level. 4.  Aortic Atherosclerosis (ICD10-I70.0). Electronically Signed   By: Tish Frederickson M.D.   On: 03/10/2023 13:59   CT Cervical Spine Wo Contrast Result Date: 03/10/2023 CLINICAL DATA:  Head trauma, minor (Age >= 65y); Neck trauma (Age >= 65y) Left flank pain and mid back pain following a fall last night. Pt states he does not remember falling, however he woke up in the floor  at about 2am after getting out of bed to go get something to drink. EXAM: CT HEAD WITHOUT CONTRAST CT CERVICAL SPINE WITHOUT CONTRAST TECHNIQUE: Multidetector CT imaging of the head and cervical spine was performed following the standard protocol without intravenous contrast. Multiplanar CT image reconstructions of the cervical spine were also generated. RADIATION DOSE REDUCTION: This exam was performed according to the departmental dose-optimization program which includes automated exposure control, adjustment of the mA and/or kV according to patient size and/or use of iterative reconstruction technique. COMPARISON:  CT head and C-spine 07/15/2022 FINDINGS: CT HEAD FINDINGS Brain: No evidence of large-territorial acute infarction. No parenchymal hemorrhage. No mass lesion. No extra-axial collection.  No mass effect or midline shift. No hydrocephalus. Basilar cisterns are patent. Vascular: No hyperdense vessel. Atherosclerotic calcifications are present within the cavernous internal carotid arteries. Skull: No acute fracture or focal lesion. Sinuses/Orbits: Paranasal sinuses and mastoid air cells are clear. Bilateral lens replacement. Otherwise the orbits are unremarkable. Other: None. CT CERVICAL SPINE FINDINGS Alignment: Normal. Skull base and vertebrae: Diffusely decreased bone density. Multilevel severe degenerative changes of the spine. Associated severe osseous neural foraminal stenosis at the right C3-C4 level. No acute fracture. No aggressive appearing focal osseous lesion or focal pathologic process. Soft tissues and spinal canal: No prevertebral fluid or swelling. No visible canal hematoma. Upper chest: Unremarkable. Other: Atherosclerotic plaque of the aorta and branch. IMPRESSION: 1. No acute intracranial abnormality. 2. No acute displaced fracture or traumatic listhesis of the cervical spine. 3. Multilevel severe degenerative changes of the spine. Associated severe osseous neural foraminal stenosis at the right C3-C4 level. 4.  Aortic Atherosclerosis (ICD10-I70.0). Electronically Signed   By: Tish Frederickson M.D.   On: 03/10/2023 13:59    EKG: Independently reviewed.  Atrial fibrillation with controlled ventricular response  Assessment/Plan Principal Problem:   Fall  Fall, presumed mechanical Unknown time down.  No evidence of clinical rhabdomyolysis.  Mild dehydration noted.  Likely as a result of alcohol use relapse.  Fortunately CT imaging survey negative for acute fracture.  No indication for surgical intervention.  Patient does have significant pain and decreased ability to ambulate. Plan: Place in observation Fall precautions Multimodal pain control PT and OT evaluations to start 12/29 May benefit from placement  Alcohol use disorder Per EDP recent relapse after several  months being dry Plan: CIWA protocol IV fluids  Paroxysmal atrial fibrillation Patient not on anticoagulation, presumably secondary to risk of falls Plan: Restart home diltiazem Cardiac monitoring x 12 hours  GERD PPI  Depression Venlafaxine  Glaucoma Timolol drops     DVT prophylaxis: SQ Lovenox Code Status: Full, discussed with patient bedside Family Communication: None at bedside Disposition Plan: Possible need for skilled nursing facility placement Consults called: None Admission status: Observation, medical telemetry   Tresa Moore MD [facility]   If 7PM-7AM, please contact night-coverage   03/10/2023, 5:41 PM

## 2023-03-11 DIAGNOSIS — W19XXXD Unspecified fall, subsequent encounter: Secondary | ICD-10-CM

## 2023-03-11 DIAGNOSIS — W19XXXA Unspecified fall, initial encounter: Secondary | ICD-10-CM | POA: Diagnosis not present

## 2023-03-11 DIAGNOSIS — I4891 Unspecified atrial fibrillation: Secondary | ICD-10-CM | POA: Diagnosis not present

## 2023-03-11 LAB — CBC
HCT: 35.9 % — ABNORMAL LOW (ref 39.0–52.0)
Hemoglobin: 12 g/dL — ABNORMAL LOW (ref 13.0–17.0)
MCH: 32.7 pg (ref 26.0–34.0)
MCHC: 33.4 g/dL (ref 30.0–36.0)
MCV: 97.8 fL (ref 80.0–100.0)
Platelets: 174 10*3/uL (ref 150–400)
RBC: 3.67 MIL/uL — ABNORMAL LOW (ref 4.22–5.81)
RDW: 13.2 % (ref 11.5–15.5)
WBC: 6 10*3/uL (ref 4.0–10.5)
nRBC: 0 % (ref 0.0–0.2)

## 2023-03-11 LAB — BASIC METABOLIC PANEL
Anion gap: 7 (ref 5–15)
BUN: 23 mg/dL (ref 8–23)
CO2: 30 mmol/L (ref 22–32)
Calcium: 8.5 mg/dL — ABNORMAL LOW (ref 8.9–10.3)
Chloride: 99 mmol/L (ref 98–111)
Creatinine, Ser: 0.74 mg/dL (ref 0.61–1.24)
GFR, Estimated: >60 mL/min (ref >60)
Glucose, Bld: 92 mg/dL (ref 70–99)
Potassium: 4.1 mmol/L (ref 3.5–5.1)
Sodium: 136 mmol/L (ref 135–145)

## 2023-03-11 MED ORDER — LIDOCAINE 5 % EX PTCH
1.0000 | MEDICATED_PATCH | CUTANEOUS | Status: DC
Start: 2023-03-11 — End: 2023-03-16
  Administered 2023-03-11 – 2023-03-16 (×6): 1 via TRANSDERMAL
  Filled 2023-03-11 (×5): qty 1

## 2023-03-11 MED ORDER — KETOROLAC TROMETHAMINE 15 MG/ML IJ SOLN
15.0000 mg | Freq: Four times a day (QID) | INTRAMUSCULAR | Status: AC
  Administered 2023-03-11 – 2023-03-13 (×5): 15 mg via INTRAVENOUS
  Filled 2023-03-11 (×5): qty 1

## 2023-03-11 NOTE — ED Notes (Signed)
Alert and oriented. Sat up to eat dinner. No complaints of discomfort.

## 2023-03-11 NOTE — Progress Notes (Signed)
Physical Therapy Evaluation Patient Details Name: Marcus Coleman MRN: [id number] DOB: [birth year] Today's Date: 03/11/2023  History of Present Illness  Pt is an 82 y/o male admitted secondary to sustaining a mechanical fall at home. CT was negative for any acute injuries. PMH including but not limited to atrial fibrillation, not on anticoagulation, GERD, depression, glaucoma, alcohol use disorder.   Clinical Impression  Pt presented supine in bed with HOB elevated, awake and willing to participate in therapy session. Prior to admission, pt reported that he ambulated with use of a cane or RW when outside and no AD when ambulating in his home. Pt lives with his spouse in a single level home with 3-4 steps to enter. At the time of evaluation, pt significantly limited with functional mobility secondary to pain. Pt only agreeable to bed mobility (rolling bilaterally) with use of bed rails and CGA. Based on pt's current functional mobility status, PT recommending further intensive therapy services (<3 hrs/day) upon d/c from the hospital to maximize his safety and independence with functional mobility prior to returning home with family support. Pt would continue to benefit from skilled physical therapy services at this time while admitted and after d/c to address the below listed limitations in order to improve overall safety and independence with functional mobility.       If plan is discharge home, recommend the following: Two people to help with walking and/or transfers;A lot of help with bathing/dressing/bathroom;Assistance with cooking/housework;Help with stairs or ramp for entrance;Assist for transportation   Can travel by private vehicle   No    Equipment Recommendations Other (comment) (defer to next venue of care)  Recommendations for Other Services       Functional Status Assessment Patient has had a recent decline in their functional status and demonstrates the ability to make significant  improvements in function in a reasonable and predictable amount of time.     Precautions / Restrictions Precautions Precautions: Fall Restrictions Weight Bearing Restrictions Per Provider Order: No      Mobility  Bed Mobility Overal bed mobility: Needs Assistance Bed Mobility: Rolling Rolling: Contact guard assist, Used rails         General bed mobility comments: pt only agreeable to roll bilaterally, using stretcher rails with PT providing CGA for safety and line management. pt with very slow, painful movement    Transfers                   General transfer comment: pt refusing further mobility at this time    Ambulation/Gait                  Stairs            Wheelchair Mobility     Tilt Bed    Modified Rankin (Stroke Patients Only)       Balance                                             Pertinent Vitals/Pain Pain Assessment Pain Assessment: Faces Faces Pain Scale: Hurts whole lot Pain Location: lumbar spine Pain Descriptors / Indicators: Grimacing, Guarding Pain Intervention(s): Monitored during session, Repositioned, Patient requesting pain meds-RN notified    Home Living Family/patient expects to be discharged to:: Private residence Living Arrangements: Spouse/significant other Available Help at Discharge: Family;Available PRN/intermittently Type of Home: House Home Access: Stairs to enter  Entrance Stairs-Rails: Right;Left Entrance Stairs-Number of Steps: 3-4   Home Layout: One level Home Equipment: Agricultural consultant (2 wheels);Cane - single point;Shower seat;Shower seat - built in;Hand held shower head Additional Comments: has a lift chair, sleeps in the lift chair    Prior Function Prior Level of Function : History of Falls (last six months)             Mobility Comments: ambulates with a cane or RW when outside PRN, no AD walking in house ADLs Comments: requires assistance from wife to wash his  back     Extremity/Trunk Assessment   Upper Extremity Assessment Upper Extremity Assessment: Generalized weakness    Lower Extremity Assessment Lower Extremity Assessment: Generalized weakness       Communication   Communication Communication: No apparent difficulties  Cognition Arousal: Alert Behavior During Therapy: WFL for tasks assessed/performed Overall Cognitive Status: Within Functional Limits for tasks assessed                                          General Comments      Exercises     Assessment/Plan    PT Assessment Patient needs continued PT services  PT Problem List Decreased strength;Decreased range of motion;Decreased activity tolerance;Decreased balance;Decreased mobility;Decreased coordination;Decreased safety awareness;Decreased knowledge of use of DME;Decreased knowledge of precautions;Pain       PT Treatment Interventions DME instruction;Gait training;Stair training;Functional mobility training;Therapeutic exercise;Therapeutic activities;Balance training;Neuromuscular re-education;Patient/family education    PT Goals (Current goals can be found in the Care Plan section)  Acute Rehab PT Goals Patient Stated Goal: decrease pain PT Goal Formulation: With patient Time For Goal Achievement: 03/25/23 Potential to Achieve Goals: Good    Frequency Min 1X/week     Co-evaluation               AM-PAC PT "6 Clicks" Mobility  Outcome Measure Help needed turning from your back to your side while in a flat bed without using bedrails?: A Little Help needed moving from lying on your back to sitting on the side of a flat bed without using bedrails?: A Little Help needed moving to and from a bed to a chair (including a wheelchair)?: A Lot Help needed standing up from a chair using your arms (e.g., wheelchair or bedside chair)?: A Lot Help needed to walk in hospital room?: A Lot Help needed climbing 3-5 steps with a railing? : Total 6  Click Score: 13    End of Session   Activity Tolerance: Patient limited by pain Patient left: with call bell/phone within reach;Other (comment) (on stretcher in ED) Nurse Communication: Mobility status;Patient requests pain meds PT Visit Diagnosis: Other abnormalities of gait and mobility (R26.89);Pain Pain - part of body:  (lumbar spine, lower back)    Time: 2130-8657 PT Time Calculation (min) (ACUTE ONLY): 29 min   Charges:   PT Evaluation $PT Eval Moderate Complexity: 1 Mod PT Treatments $Therapeutic Activity: 8-22 mins PT General Charges $$ ACUTE PT VISIT: 1 Visit         Arletta Bale, DPT  Acute Rehabilitation Services Office [phone]   Marcus Coleman 03/11/2023, 12:09 PM

## 2023-03-11 NOTE — TOC Initial Note (Signed)
Transition of Care [facility]) - Initial/Assessment Note    Patient Details  Name: Marcus Coleman MRN: [id number] Date of Birth: July 08, 1940  Transition of Care [facility]) CM/SW Contact:    Verna Czech [place], [place] Phone Number: 03/11/2023, 3:47 PM  Clinical Narrative:                 Met with patient at bedside to discuss SNF recommendation. Patient agreeable to SNF placement and has a preference of [facility](first choice), [name], or [facility]. Patient  resides with spouse, is followed by Dr. Judithann Sheen through [facility] and uses [facility]. Patient states that he usually sleeps in a recliner at home, has a walker and cane. Fl2 completed and faxed out to facilities of choice.  Expected Discharge Plan: Skilled Nursing Facility Barriers to Discharge: Continued Medical Work up   Patient Goals and CMS Choice Patient states their goals for this hospitalization and ongoing recovery are:: "I would like t o be able to sit up without pain" CMS Medicare.gov Compare Post Acute Care list provided to:: Patient Choice offered to / list presented to : Patient      Expected Discharge Plan and Services In-house Referral: Clinical Social Work   Post Acute Care Choice: Skilled Nursing Facility Living arrangements for the past 2 months: Single Family Home                                      Prior Living Arrangements/Services Living arrangements for the past 2 months: Single Family Home Lives with:: Spouse Patient language and need for interpreter reviewed:: Yes Do you feel safe going back to the place where you live?: Yes        Care giver support system in place?: Yes (comment)   Criminal Activity/Legal Involvement Pertinent to Current Situation/Hospitalization: No - Comment as needed  Activities of Daily Living      Permission Sought/Granted   Permission granted to share information with : Yes, Verbal Permission Granted  Share Information with  NAME: anoinette Lizarraga     Permission granted to share info w Relationship: spuse  Permission granted to share info w Contact Information: [phone]  Emotional Assessment Appearance:: Appears stated age Attitude/Demeanor/Rapport: Engaged Affect (typically observed): Accepting, Adaptable Orientation: : Oriented to Place, Oriented to Self, Oriented to  Time, Oriented to Situation   Psych Involvement: No (comment)  Admission diagnosis:  Fall [W19.XXXA] Patient Active Problem List   Diagnosis Date Noted   Fall 03/10/2023   Pneumonia, unspecified organism 07/27/2022   Lower extremity edema 07/21/2022   Nausea 07/20/2022   Constipation 07/20/2022   HTN (hypertension) 07/15/2022   CAD (coronary artery disease) 07/15/2022   Oxygen desaturation 07/15/2022   Acute back pain 07/15/2022   Alcohol abuse 07/15/2022   Class 1 obesity due to excess calories with serious comorbidity and body mass index (BMI) of 31.0 to 31.9 in adult 07/10/2022   Male erectile disorder 02/27/2022   Exposure to [facility] 02/27/2022   Other persistent atrial fibrillation (HCC) 02/27/2022   Decreased responsiveness 02/11/2022   Elevated lactic acid level 02/11/2022   Severe sepsis (HCC) 02/10/2022   Status post reverse total shoulder replacement, 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   Nontraumatic complete tear of left rotator cuff 12/26/2021   Persistent atrial fibrillation (HCC) 06/01/2021   Cataract 11/23/2020   Low back pain 11/23/2020   Other personal history presenting hazards to health 11/23/2020  Psychosexual dysfunction with inhibited sexual excitement 11/23/2020   History of GI bleed 06/09/2019   Acute deep vein thrombosis (DVT) of popliteal vein (HCC) 06/09/2019   History of DVT (deep vein thrombosis) 06/09/2019   B12 deficiency anemia 04/21/2019   Pain in limb 03/28/2019   Nonrheumatic mitral valve regurgitation 12/10/2018   Benign prostatic hyperplasia with lower urinary tract symptoms 11/06/2018   Hiatal  hernia 09/28/2017   Hyperlipidemia 09/28/2017   Vasovagal syncope 09/28/2017   DDD (degenerative disc disease), thoracolumbar 11/13/2014   Lumbar radiculitis 11/13/2014   Iron deficiency anemia 10/06/2013   Anemia 10/06/2013   S/P ERCP 01/23/2013   PCP:  [name], MD Pharmacy:   [facility] [address] [place] - [address] [place] [address] Phone: [phone] Fax: [phone]  [facility] - [place], [place] - [address] [place] [address] Phone: [phone] Fax: [phone]     Social Drivers of Health (SDOH) Social History: SDOH Screenings   Food Insecurity: No Food Insecurity (03/11/2023)  Housing: Unknown (03/11/2023)  Transportation Needs: No Transportation Needs (03/11/2023)  Utilities: Not At Risk (03/11/2023)  Depression (PHQ2-9): Low Risk  (03/11/2023)  Financial Resource Strain: Low Risk  (08/15/2022)   [facility]  Tobacco Use: Medium Risk (03/10/2023)   SDOH Interventions: Food Insecurity Interventions: Intervention Not Indicated Housing Interventions: Intervention Not Indicated Transportation Interventions: Intervention Not Indicated Utilities Interventions: Intervention Not Indicated   Readmission Risk Interventions     No data to display

## 2023-03-11 NOTE — Progress Notes (Signed)
PROGRESS NOTE    Marcus Coleman  AVW:[id number] DOB: 1940/04/20 DOA: 03/10/2023 PCP: [name], MD    Brief Narrative:   82 y.o. male with medical history significant of atrial fibrillation, not on anticoagulation, GERD, depression, glaucoma, alcohol use disorder who presents for evaluation after a fall.  Unknown time down.  Patient states that he woke up approximately 2 AM on the day of presentation on the ground in his house.  Wife came and found him at 5 AM on the ground.  Unable to get himself up.  Of note patient has a history of alcoholism and relapsed a few days ago after being dry for several months.   On presentation patient is hemodynamically stable.  Laboratory investigation reveals mild dehydration.  Extensive CT imaging survey negative for fracture.  Patient has significant rib pain likely as a result of a contusion.  No previous history of recent illness.  No focal numbness or weakness to suggest neurologic injury.   Assessment & Plan:   Principal Problem:   Fall  Fall, presumed mechanical Unknown time down.  No evidence of clinical rhabdomyolysis.  Mild dehydration noted.  Likely as a result of alcohol use relapse.  Fortunately CT imaging survey negative for acute fracture.  No indication for surgical intervention.  Patient does have significant pain and decreased ability to ambulate.  Seen by PT/OT.  Rec SNF.  Patient in agreement. Plan: Multimodal pain control Avoid IV narcotics if able Continue therapy and mobility efforts Will need SNF.  TOC made aware   Alcohol use disorder Per EDP recent relapse after several months being dry No clinical evidence of withdrawal Plan: CIWA protocol Diet as tolerated   Paroxysmal atrial fibrillation Patient not on anticoagulation, presumably secondary to risk of falls Plan: Restart home diltiazem DC cardiac monitoring   GERD PPI   Depression Venlafaxine   Glaucoma Timolol drops   DVT prophylaxis: SQ  lovenox Code Status: FULL Family Communication:Spouse at bedside 12/29 Disposition Plan: Status is: Observation The patient will require care spanning > 2 midnights and should be moved to inpatient because: Intractable pain, inability to ambulate   Level of care: Telemetry Medical  Consultants:  None  Procedures:  None  Antimicrobials: None    Subjective: Seen and examined.  No acute events overnight.  No new complaints.  Still experiencing right rib pain and decreased ability to ambulate  Objective: Vitals:   03/11/23 0600 03/11/23 0645 03/11/23 0931 03/11/23 1329  BP: 118/82  (!) 129/93 124/86  Pulse: 71  90 97  Resp: (!) 21  16 16   Temp:  97.9 F (36.6 C) 98 F (36.7 C) 98 F (36.7 C)  TempSrc:  Oral Oral Oral  SpO2: 97%  96% 95%  Weight:      Height:       No intake or output data in the 24 hours ending 03/11/23 1430 Filed Weights   03/10/23 1021  Weight: 77.1 kg    Examination:  General exam: Appears calm and comfortable  Respiratory system: Clear to auscultation. Respiratory effort normal. Cardiovascular system: S1S2, RRR, no murmur, no pedal edema Gastrointestinal system: Soft NT/ND normal BS Central nervous system: Alert and oriented. No focal neurological deficits. Extremities: Symmetric 5 x 5 power. Skin: No rashes, lesions or ulcers Psychiatry: Judgement and insight appear normal. Mood & affect appropriate.     Data Reviewed: I have personally reviewed following labs and imaging studies  CBC: Recent Labs  Lab 03/10/23 1132 03/11/23 0549  WBC  8.7 6.0  HGB 14.4 12.0*  HCT 43.5 35.9*  MCV 98.0 97.8  PLT 237 174   Basic Metabolic Panel: Recent Labs  Lab 03/10/23 1132 03/11/23 0549  NA 141 136  K 4.1 4.1  CL 103 99  CO2 26 30  GLUCOSE 92 92  BUN 21 23  CREATININE 0.64 0.74  CALCIUM 9.0 8.5*   GFR: Estimated Creatinine Clearance: 72 mL/min (by C-G formula based on SCr of 0.74 mg/dL). Liver Function Tests: Recent Labs  Lab  03/10/23 1132  AST 27  ALT 30  ALKPHOS 98  BILITOT 1.0  PROT 7.0  ALBUMIN 3.9   Recent Labs  Lab 03/10/23 1132  LIPASE 25   No results for input(s): "AMMONIA" in the last 168 hours. Coagulation Profile: No results for input(s): "INR", "PROTIME" in the last 168 hours. Cardiac Enzymes: Recent Labs  Lab 03/10/23 1132  CKTOTAL 60   BNP (last 3 results) No results for input(s): "PROBNP" in the last 8760 hours. HbA1C: No results for input(s): "HGBA1C" in the last 72 hours. CBG: No results for input(s): "GLUCAP" in the last 168 hours. Lipid Profile: No results for input(s): "CHOL", "HDL", "LDLCALC", "TRIG", "CHOLHDL", "LDLDIRECT" in the last 72 hours. Thyroid Function Tests: No results for input(s): "TSH", "T4TOTAL", "FREET4", "T3FREE", "THYROIDAB" in the last 72 hours. Anemia Panel: No results for input(s): "VITAMINB12", "FOLATE", "FERRITIN", "TIBC", "IRON", "RETICCTPCT" in the last 72 hours. Sepsis Labs: No results for input(s): "PROCALCITON", "LATICACIDVEN" in the last 168 hours.  No results found for this or any previous visit (from the past 240 hours).       Radiology Studies: CT CHEST ABDOMEN PELVIS W CONTRAST Result Date: 03/10/2023 CLINICAL DATA:  Polytrauma, blunt left flank pain and mid back pain following a fall last night. Pt states he does not remember falling, however he woke up in the floor at about 2am after getting out of bed to go get something to drink. EXAM: CT CHEST, ABDOMEN, AND PELVIS WITH CONTRAST TECHNIQUE: Multidetector CT imaging of the chest, abdomen and pelvis was performed following the standard protocol during bolus administration of intravenous contrast. RADIATION DOSE REDUCTION: This exam was performed according to the departmental dose-optimization program which includes automated exposure control, adjustment of the mA and/or kV according to patient size and/or use of iterative reconstruction technique. CONTRAST:  80mL OMNIPAQUE IOHEXOL 300  MG/ML  SOLN COMPARISON:  None Available. FINDINGS: CHEST: Cardiovascular: No aortic injury. The thoracic aorta is normal in caliber. Enlarged right heart. No significant pericardial effusion. Coronary artery calcification. The main pulmonary artery is normal in caliber. No central pulmonary embolus. Limited evaluation more distally due to timing of contrast. Mediastinum/Nodes: No pneumomediastinum. No mediastinal hematoma. The esophagus is unremarkable. Large hiatal hernia containing the majority of the stomach. The thyroid is unremarkable. The central airways are patent. No mediastinal, hilar, or axillary lymphadenopathy. Lungs/Pleura: No focal consolidation. No pulmonary nodule. No pulmonary mass. No pulmonary contusion or laceration. No pneumatocele formation. No pleural effusion. No pneumothorax. No hemothorax. Musculoskeletal/Chest wall: No chest wall mass. No acute rib or sternal fracture. Please see separately dictated CT thoracolumbar spine. Total left shoulder arthroplasty. Degenerative changes of the right shoulder. ABDOMEN / PELVIS: Hepatobiliary: Not enlarged. Fluid density lesion of the right hepatic lobe likely a simple hepatic cyst. Grossly similar-appearing subcentimeter hypodensities too small to characterize. No laceration or subcapsular hematoma. The gallbladder is otherwise unremarkable with no radio-opaque gallstones. No biliary ductal dilatation. Pancreas: Diffusely atrophic. Question developing fluid dense lesion along the pancreatic  neck/proximal body (2:62). Otherwise normal pancreatic contour. No main pancreatic duct dilatation. Spleen: Not enlarged. No focal lesion. No laceration, subcapsular hematoma, or vascular injury. Adrenals/Urinary Tract: No nodularity bilaterally. Bilateral kidneys enhance symmetrically. Redemonstration of an 8 mm calcification within the left kidney. Simple renal cysts within left kidney likely represent simple renal cysts. Simple renal cysts, in the absence of  clinically indicated signs/symptoms, require no independent follow-up. No hydronephrosis. No contusion, laceration, or subcapsular hematoma. No injury to the vascular structures or collecting systems. No hydroureter. The urinary bladder is unremarkable. Stomach/Bowel: No small or large bowel wall thickening or dilatation. The appendix is unremarkable. Vasculature/Lymphatics: Severe atherosclerotic plaque. No abdominal aorta or iliac aneurysm. No active contrast extravasation or pseudoaneurysm. No abdominal, pelvic, inguinal lymphadenopathy. Reproductive: Prostate is unremarkable. Other: No simple free fluid ascites. No pneumoperitoneum. No hemoperitoneum. No mesenteric hematoma identified. No organized fluid collection. Musculoskeletal: No significant soft tissue hematoma. No acute pelvic fracture. Please see separately dictated CT thoracolumbar spine. Ports and Devices: None. IMPRESSION: 1. No acute intrathoracic, intra-abdominal, intrapelvic traumatic injury. 2. Please see separately dictated CT thoracolumbar spine. Other imaging findings of potential clinical significance: 1. Question developing fluid dense lesion along the pancreatic neck/proximal body-recommend outpatient MR pancreatic protocol further evaluation. 2. Right cardiomegaly. 3. Large hiatal hernia containing the majority of the stomach. 4. Nonobstructive 8 mm left nephrolithiasis. 5. Aortic Atherosclerosis (ICD10-I70.0). Electronically Signed   By: Tish Frederickson M.D.   On: 03/10/2023 14:29   CT T-SPINE NO CHARGE Result Date: 03/10/2023 CLINICAL DATA:  left flank pain and mid back pain following a fall last night. Pt states he does not remember falling, however he woke up in the floor at about 2am after getting out of bed to go get something to drink. EXAM: CT THORACIC AND LUMBAR SPINE WITHOUT CONTRAST TECHNIQUE: Multidetector CT imaging of the thoracic and lumbar spine was performed without contrast. Multiplanar CT image reconstructions were  also generated. RADIATION DOSE REDUCTION: This exam was performed according to the departmental dose-optimization program which includes automated exposure control, adjustment of the mA and/or kV according to patient size and/or use of iterative reconstruction technique. COMPARISON:  MRI thoracic spine 07/20/2022, CT abdomen pelvis the 12/30/2021 FINDINGS: CT THORACIC SPINE FINDINGS Alignment: Similar-appearing compensatory dextroscoliosis of the lumbar spine centered at the T11 level. Vertebrae: Diffusely decreased bone density. Similar-appearing chronic T12 anterior wedge compression fracture. Similar-appearing mild anterior wedge compression deformity of the T11 level. Interval increase in degenerative changes along the T10-T11 level. No acute fracture or focal pathologic process. No severe osseous neural foraminal or central canal stenosis. Paraspinal and other soft tissues: Negative. Disc levels: Multilevel mild degenerative changes of the spine. CT LUMBAR SPINE FINDINGS Segmentation: 5 lumbar type vertebrae. Alignment: Similar-appearing levoscoliosis of the lumbar spine centered at the L2-L3 level. Vertebrae: Diffusely decreased bone density. Similar-appearing fusion at the L1-L2 levels with marked decreased density but no acute fracture associated. Multilevel severe degenerative changes of the spine. No acute fracture or focal pathologic process. No severe osseous neural foraminal or central canal stenosis. Paraspinal and other soft tissues: Negative. Disc levels: Multilevel intervertebral disc space vacuum phenomenon. IMPRESSION: CT THORACIC SPINE IMPRESSION 1. No acute displaced fracture or traumatic listhesis of the thoracic spine. 2. Interval increase in T10-T11 degenerative changes in a patient with dextroscoliosis of the thoracolumbar spine centered at the T11 level and chronic similar-appearing anterior wedge compression deformity of the T11 level and anterior wedge compression fracture of the T12  level. 3. Marked diffusely decreased bone density.  CT LUMBAR SPINE IMPRESSION 1. No acute displaced fracture or traumatic listhesis of the lumbar spine. 2. Marked diffusely decreased bone density. Electronically Signed   By: Tish Frederickson M.D.   On: 03/10/2023 14:07   CT L-SPINE NO CHARGE Result Date: 03/10/2023 CLINICAL DATA:  left flank pain and mid back pain following a fall last night. Pt states he does not remember falling, however he woke up in the floor at about 2am after getting out of bed to go get something to drink. EXAM: CT THORACIC AND LUMBAR SPINE WITHOUT CONTRAST TECHNIQUE: Multidetector CT imaging of the thoracic and lumbar spine was performed without contrast. Multiplanar CT image reconstructions were also generated. RADIATION DOSE REDUCTION: This exam was performed according to the departmental dose-optimization program which includes automated exposure control, adjustment of the mA and/or kV according to patient size and/or use of iterative reconstruction technique. COMPARISON:  MRI thoracic spine 07/20/2022, CT abdomen pelvis the 12/30/2021 FINDINGS: CT THORACIC SPINE FINDINGS Alignment: Similar-appearing compensatory dextroscoliosis of the lumbar spine centered at the T11 level. Vertebrae: Diffusely decreased bone density. Similar-appearing chronic T12 anterior wedge compression fracture. Similar-appearing mild anterior wedge compression deformity of the T11 level. Interval increase in degenerative changes along the T10-T11 level. No acute fracture or focal pathologic process. No severe osseous neural foraminal or central canal stenosis. Paraspinal and other soft tissues: Negative. Disc levels: Multilevel mild degenerative changes of the spine. CT LUMBAR SPINE FINDINGS Segmentation: 5 lumbar type vertebrae. Alignment: Similar-appearing levoscoliosis of the lumbar spine centered at the L2-L3 level. Vertebrae: Diffusely decreased bone density. Similar-appearing fusion at the L1-L2 levels  with marked decreased density but no acute fracture associated. Multilevel severe degenerative changes of the spine. No acute fracture or focal pathologic process. No severe osseous neural foraminal or central canal stenosis. Paraspinal and other soft tissues: Negative. Disc levels: Multilevel intervertebral disc space vacuum phenomenon. IMPRESSION: CT THORACIC SPINE IMPRESSION 1. No acute displaced fracture or traumatic listhesis of the thoracic spine. 2. Interval increase in T10-T11 degenerative changes in a patient with dextroscoliosis of the thoracolumbar spine centered at the T11 level and chronic similar-appearing anterior wedge compression deformity of the T11 level and anterior wedge compression fracture of the T12 level. 3. Marked diffusely decreased bone density. CT LUMBAR SPINE IMPRESSION 1. No acute displaced fracture or traumatic listhesis of the lumbar spine. 2. Marked diffusely decreased bone density. Electronically Signed   By: Tish Frederickson M.D.   On: 03/10/2023 14:07   CT Head Wo Contrast Result Date: 03/10/2023 CLINICAL DATA:  Head trauma, minor (Age >= 65y); Neck trauma (Age >= 65y) Left flank pain and mid back pain following a fall last night. Pt states he does not remember falling, however he woke up in the floor at about 2am after getting out of bed to go get something to drink. EXAM: CT HEAD WITHOUT CONTRAST CT CERVICAL SPINE WITHOUT CONTRAST TECHNIQUE: Multidetector CT imaging of the head and cervical spine was performed following the standard protocol without intravenous contrast. Multiplanar CT image reconstructions of the cervical spine were also generated. RADIATION DOSE REDUCTION: This exam was performed according to the departmental dose-optimization program which includes automated exposure control, adjustment of the mA and/or kV according to patient size and/or use of iterative reconstruction technique. COMPARISON:  CT head and C-spine 07/15/2022 FINDINGS: CT HEAD FINDINGS  Brain: No evidence of large-territorial acute infarction. No parenchymal hemorrhage. No mass lesion. No extra-axial collection. No mass effect or midline shift. No hydrocephalus. Basilar cisterns are patent. Vascular: No  hyperdense vessel. Atherosclerotic calcifications are present within the cavernous internal carotid arteries. Skull: No acute fracture or focal lesion. Sinuses/Orbits: Paranasal sinuses and mastoid air cells are clear. Bilateral lens replacement. Otherwise the orbits are unremarkable. Other: None. CT CERVICAL SPINE FINDINGS Alignment: Normal. Skull base and vertebrae: Diffusely decreased bone density. Multilevel severe degenerative changes of the spine. Associated severe osseous neural foraminal stenosis at the right C3-C4 level. No acute fracture. No aggressive appearing focal osseous lesion or focal pathologic process. Soft tissues and spinal canal: No prevertebral fluid or swelling. No visible canal hematoma. Upper chest: Unremarkable. Other: Atherosclerotic plaque of the aorta and branch. IMPRESSION: 1. No acute intracranial abnormality. 2. No acute displaced fracture or traumatic listhesis of the cervical spine. 3. Multilevel severe degenerative changes of the spine. Associated severe osseous neural foraminal stenosis at the right C3-C4 level. 4.  Aortic Atherosclerosis (ICD10-I70.0). Electronically Signed   By: Tish Frederickson M.D.   On: 03/10/2023 13:59   CT Cervical Spine Wo Contrast Result Date: 03/10/2023 CLINICAL DATA:  Head trauma, minor (Age >= 65y); Neck trauma (Age >= 65y) Left flank pain and mid back pain following a fall last night. Pt states he does not remember falling, however he woke up in the floor at about 2am after getting out of bed to go get something to drink. EXAM: CT HEAD WITHOUT CONTRAST CT CERVICAL SPINE WITHOUT CONTRAST TECHNIQUE: Multidetector CT imaging of the head and cervical spine was performed following the standard protocol without intravenous contrast.  Multiplanar CT image reconstructions of the cervical spine were also generated. RADIATION DOSE REDUCTION: This exam was performed according to the departmental dose-optimization program which includes automated exposure control, adjustment of the mA and/or kV according to patient size and/or use of iterative reconstruction technique. COMPARISON:  CT head and C-spine 07/15/2022 FINDINGS: CT HEAD FINDINGS Brain: No evidence of large-territorial acute infarction. No parenchymal hemorrhage. No mass lesion. No extra-axial collection. No mass effect or midline shift. No hydrocephalus. Basilar cisterns are patent. Vascular: No hyperdense vessel. Atherosclerotic calcifications are present within the cavernous internal carotid arteries. Skull: No acute fracture or focal lesion. Sinuses/Orbits: Paranasal sinuses and mastoid air cells are clear. Bilateral lens replacement. Otherwise the orbits are unremarkable. Other: None. CT CERVICAL SPINE FINDINGS Alignment: Normal. Skull base and vertebrae: Diffusely decreased bone density. Multilevel severe degenerative changes of the spine. Associated severe osseous neural foraminal stenosis at the right C3-C4 level. No acute fracture. No aggressive appearing focal osseous lesion or focal pathologic process. Soft tissues and spinal canal: No prevertebral fluid or swelling. No visible canal hematoma. Upper chest: Unremarkable. Other: Atherosclerotic plaque of the aorta and branch. IMPRESSION: 1. No acute intracranial abnormality. 2. No acute displaced fracture or traumatic listhesis of the cervical spine. 3. Multilevel severe degenerative changes of the spine. Associated severe osseous neural foraminal stenosis at the right C3-C4 level. 4.  Aortic Atherosclerosis (ICD10-I70.0). Electronically Signed   By: Tish Frederickson M.D.   On: 03/10/2023 13:59        Scheduled Meds:  diltiazem  120 mg Oral Daily   enoxaparin (LOVENOX) injection  40 mg Subcutaneous Q24H   feeding supplement   237 mL Oral BID BM   folic acid  1 mg Oral Daily   ketorolac  15 mg Intravenous Q6H   lidocaine  1 patch Transdermal Q24H   methocarbamol  500 mg Oral TID   multivitamin with minerals  1 tablet Oral Daily   pantoprazole  40 mg Oral Daily   polyethylene glycol  17 g Oral Daily   potassium chloride  10 mEq Oral Daily   thiamine  100 mg Oral Daily   timolol  1 drop Both Eyes Daily   venlafaxine XR  75 mg Oral Q breakfast   Continuous Infusions:  sodium chloride 40 mL/hr at 03/10/23 1955     LOS: 0 days       Tresa Moore, MD [facility]   If 7PM-7AM, please contact night-coverage  03/11/2023, 2:30 PM

## 2023-03-12 DIAGNOSIS — I4891 Unspecified atrial fibrillation: Secondary | ICD-10-CM | POA: Diagnosis not present

## 2023-03-12 DIAGNOSIS — W19XXXD Unspecified fall, subsequent encounter: Secondary | ICD-10-CM | POA: Diagnosis not present

## 2023-03-12 DIAGNOSIS — W19XXXA Unspecified fall, initial encounter: Secondary | ICD-10-CM | POA: Diagnosis not present

## 2023-03-12 MED ORDER — ORAL CARE MOUTH RINSE: 15.0000 mL | OROMUCOSAL | Status: DC | PRN

## 2023-03-12 NOTE — Progress Notes (Signed)
PROGRESS NOTE    Marcus Coleman  ZOX:[id number] DOB: 17-Oct-1940 DOA: 03/10/2023 PCP: [name], MD    Brief Narrative:   82 y.o. male with medical history significant of atrial fibrillation, not on anticoagulation, GERD, depression, glaucoma, alcohol use disorder who presents for evaluation after a fall.  Unknown time down.  Patient states that he woke up approximately 2 AM on the day of presentation on the ground in his house.  Wife came and found him at 5 AM on the ground.  Unable to get himself up.  Of note patient has a history of alcoholism and relapsed a few days ago after being dry for several months.   On presentation patient is hemodynamically stable.  Laboratory investigation reveals mild dehydration.  Extensive CT imaging survey negative for fracture.  Patient has significant rib pain likely as a result of a contusion.  No previous history of recent illness.  No focal numbness or weakness to suggest neurologic injury.   Assessment & Plan:   Principal Problem:   Fall  Fall, presumed mechanical Unknown time down.  No evidence of clinical rhabdomyolysis.  Mild dehydration noted.  Likely as a result of alcohol use relapse.  Fortunately CT imaging survey negative for acute fracture.  No indication for surgical intervention.  Patient does have significant pain and decreased ability to ambulate.  Seen by PT/OT.  Rec SNF.  Patient in agreement.  Pain control improving.  Mobility improving Plan: Multimodal pain control Avoid IV narcotics if able Continue therapy and mobility efforts Need skilled nursing facility.  Bed search initiated   Alcohol use disorder Per EDP recent relapse after several months being dry No clinical evidence of withdrawal Plan: DC CIWA protocol Okay for diet as tolerated   Paroxysmal atrial fibrillation Patient not on anticoagulation, presumably secondary to risk of falls Plan: Continue home diltiazem   GERD PPI   Depression Venlafaxine    Glaucoma Timolol drops   DVT prophylaxis: SQ lovenox Code Status: FULL Family Communication:Spouse at bedside 12/29 Disposition Plan: Status is: Observation The patient will require care spanning > 2 midnights and should be moved to inpatient because: Intractable pain.  Multimodal pain control.  Inability to ambulate.  Need for skilled nursing facility placement.     Level of care: Telemetry Medical  Consultants:  None  Procedures:  None  Antimicrobials: None    Subjective: Seen and examined.  Pain control slowly improving.  Objective: Vitals:   03/12/23 0630 03/12/23 0737 03/12/23 0834 03/12/23 0905  BP:  (!) 135/97  116/68  Pulse: 66 71  75  Resp: 15 20  19   Temp:    97.7 F (36.5 C)  TempSrc:    Oral  SpO2: 97% 98% 97% 97%  Weight:      Height:        Intake/Output Summary (Last 24 hours) at 03/12/2023 1015 Last data filed at 03/11/2023 1549 Gross per 24 hour  Intake 999 ml  Output --  Net 999 ml   Filed Weights   03/10/23 1021  Weight: 77.1 kg    Examination:  General exam: NAD Respiratory system: Clear to auscultation. Respiratory effort normal. Cardiovascular system: S1S2, RRR, no murmur, no pedal edema Gastrointestinal system: Soft NT/ND normal BS Central nervous system: Alert and oriented. No focal neurological deficits. Extremities: Symmetric 5 x 5 power.  Gait not assessed Skin: No rashes, lesions or ulcers Psychiatry: Judgement and insight appear normal. Mood & affect appropriate.     Data Reviewed: I  have personally reviewed following labs and imaging studies  CBC: Recent Labs  Lab 03/10/23 1132 03/11/23 0549  WBC 8.7 6.0  HGB 14.4 12.0*  HCT 43.5 35.9*  MCV 98.0 97.8  PLT 237 174   Basic Metabolic Panel: Recent Labs  Lab 03/10/23 1132 03/11/23 0549  NA 141 136  K 4.1 4.1  CL 103 99  CO2 26 30  GLUCOSE 92 92  BUN 21 23  CREATININE 0.64 0.74  CALCIUM 9.0 8.5*   GFR: Estimated Creatinine Clearance: 72 mL/min  (by C-G formula based on SCr of 0.74 mg/dL). Liver Function Tests: Recent Labs  Lab 03/10/23 1132  AST 27  ALT 30  ALKPHOS 98  BILITOT 1.0  PROT 7.0  ALBUMIN 3.9   Recent Labs  Lab 03/10/23 1132  LIPASE 25   No results for input(s): "AMMONIA" in the last 168 hours. Coagulation Profile: No results for input(s): "INR", "PROTIME" in the last 168 hours. Cardiac Enzymes: Recent Labs  Lab 03/10/23 1132  CKTOTAL 60   BNP (last 3 results) No results for input(s): "PROBNP" in the last 8760 hours. HbA1C: No results for input(s): "HGBA1C" in the last 72 hours. CBG: No results for input(s): "GLUCAP" in the last 168 hours. Lipid Profile: No results for input(s): "CHOL", "HDL", "LDLCALC", "TRIG", "CHOLHDL", "LDLDIRECT" in the last 72 hours. Thyroid Function Tests: No results for input(s): "TSH", "T4TOTAL", "FREET4", "T3FREE", "THYROIDAB" in the last 72 hours. Anemia Panel: No results for input(s): "VITAMINB12", "FOLATE", "FERRITIN", "TIBC", "IRON", "RETICCTPCT" in the last 72 hours. Sepsis Labs: No results for input(s): "PROCALCITON", "LATICACIDVEN" in the last 168 hours.  No results found for this or any previous visit (from the past 240 hours).       Radiology Studies: CT CHEST ABDOMEN PELVIS W CONTRAST Result Date: 03/10/2023 CLINICAL DATA:  Polytrauma, blunt left flank pain and mid back pain following a fall last night. Pt states he does not remember falling, however he woke up in the floor at about 2am after getting out of bed to go get something to drink. EXAM: CT CHEST, ABDOMEN, AND PELVIS WITH CONTRAST TECHNIQUE: Multidetector CT imaging of the chest, abdomen and pelvis was performed following the standard protocol during bolus administration of intravenous contrast. RADIATION DOSE REDUCTION: This exam was performed according to the departmental dose-optimization program which includes automated exposure control, adjustment of the mA and/or kV according to patient size  and/or use of iterative reconstruction technique. CONTRAST:  80mL OMNIPAQUE IOHEXOL 300 MG/ML  SOLN COMPARISON:  None Available. FINDINGS: CHEST: Cardiovascular: No aortic injury. The thoracic aorta is normal in caliber. Enlarged right heart. No significant pericardial effusion. Coronary artery calcification. The main pulmonary artery is normal in caliber. No central pulmonary embolus. Limited evaluation more distally due to timing of contrast. Mediastinum/Nodes: No pneumomediastinum. No mediastinal hematoma. The esophagus is unremarkable. Large hiatal hernia containing the majority of the stomach. The thyroid is unremarkable. The central airways are patent. No mediastinal, hilar, or axillary lymphadenopathy. Lungs/Pleura: No focal consolidation. No pulmonary nodule. No pulmonary mass. No pulmonary contusion or laceration. No pneumatocele formation. No pleural effusion. No pneumothorax. No hemothorax. Musculoskeletal/Chest wall: No chest wall mass. No acute rib or sternal fracture. Please see separately dictated CT thoracolumbar spine. Total left shoulder arthroplasty. Degenerative changes of the right shoulder. ABDOMEN / PELVIS: Hepatobiliary: Not enlarged. Fluid density lesion of the right hepatic lobe likely a simple hepatic cyst. Grossly similar-appearing subcentimeter hypodensities too small to characterize. No laceration or subcapsular hematoma. The gallbladder is otherwise  unremarkable with no radio-opaque gallstones. No biliary ductal dilatation. Pancreas: Diffusely atrophic. Question developing fluid dense lesion along the pancreatic neck/proximal body (2:62). Otherwise normal pancreatic contour. No main pancreatic duct dilatation. Spleen: Not enlarged. No focal lesion. No laceration, subcapsular hematoma, or vascular injury. Adrenals/Urinary Tract: No nodularity bilaterally. Bilateral kidneys enhance symmetrically. Redemonstration of an 8 mm calcification within the left kidney. Simple renal cysts within  left kidney likely represent simple renal cysts. Simple renal cysts, in the absence of clinically indicated signs/symptoms, require no independent follow-up. No hydronephrosis. No contusion, laceration, or subcapsular hematoma. No injury to the vascular structures or collecting systems. No hydroureter. The urinary bladder is unremarkable. Stomach/Bowel: No small or large bowel wall thickening or dilatation. The appendix is unremarkable. Vasculature/Lymphatics: Severe atherosclerotic plaque. No abdominal aorta or iliac aneurysm. No active contrast extravasation or pseudoaneurysm. No abdominal, pelvic, inguinal lymphadenopathy. Reproductive: Prostate is unremarkable. Other: No simple free fluid ascites. No pneumoperitoneum. No hemoperitoneum. No mesenteric hematoma identified. No organized fluid collection. Musculoskeletal: No significant soft tissue hematoma. No acute pelvic fracture. Please see separately dictated CT thoracolumbar spine. Ports and Devices: None. IMPRESSION: 1. No acute intrathoracic, intra-abdominal, intrapelvic traumatic injury. 2. Please see separately dictated CT thoracolumbar spine. Other imaging findings of potential clinical significance: 1. Question developing fluid dense lesion along the pancreatic neck/proximal body-recommend outpatient MR pancreatic protocol further evaluation. 2. Right cardiomegaly. 3. Large hiatal hernia containing the majority of the stomach. 4. Nonobstructive 8 mm left nephrolithiasis. 5. Aortic Atherosclerosis (ICD10-I70.0). Electronically Signed   By: Tish Frederickson M.D.   On: 03/10/2023 14:29   CT T-SPINE NO CHARGE Result Date: 03/10/2023 CLINICAL DATA:  left flank pain and mid back pain following a fall last night. Pt states he does not remember falling, however he woke up in the floor at about 2am after getting out of bed to go get something to drink. EXAM: CT THORACIC AND LUMBAR SPINE WITHOUT CONTRAST TECHNIQUE: Multidetector CT imaging of the thoracic and  lumbar spine was performed without contrast. Multiplanar CT image reconstructions were also generated. RADIATION DOSE REDUCTION: This exam was performed according to the departmental dose-optimization program which includes automated exposure control, adjustment of the mA and/or kV according to patient size and/or use of iterative reconstruction technique. COMPARISON:  MRI thoracic spine 07/20/2022, CT abdomen pelvis the 12/30/2021 FINDINGS: CT THORACIC SPINE FINDINGS Alignment: Similar-appearing compensatory dextroscoliosis of the lumbar spine centered at the T11 level. Vertebrae: Diffusely decreased bone density. Similar-appearing chronic T12 anterior wedge compression fracture. Similar-appearing mild anterior wedge compression deformity of the T11 level. Interval increase in degenerative changes along the T10-T11 level. No acute fracture or focal pathologic process. No severe osseous neural foraminal or central canal stenosis. Paraspinal and other soft tissues: Negative. Disc levels: Multilevel mild degenerative changes of the spine. CT LUMBAR SPINE FINDINGS Segmentation: 5 lumbar type vertebrae. Alignment: Similar-appearing levoscoliosis of the lumbar spine centered at the L2-L3 level. Vertebrae: Diffusely decreased bone density. Similar-appearing fusion at the L1-L2 levels with marked decreased density but no acute fracture associated. Multilevel severe degenerative changes of the spine. No acute fracture or focal pathologic process. No severe osseous neural foraminal or central canal stenosis. Paraspinal and other soft tissues: Negative. Disc levels: Multilevel intervertebral disc space vacuum phenomenon. IMPRESSION: CT THORACIC SPINE IMPRESSION 1. No acute displaced fracture or traumatic listhesis of the thoracic spine. 2. Interval increase in T10-T11 degenerative changes in a patient with dextroscoliosis of the thoracolumbar spine centered at the T11 level and chronic similar-appearing anterior wedge  compression  deformity of the T11 level and anterior wedge compression fracture of the T12 level. 3. Marked diffusely decreased bone density. CT LUMBAR SPINE IMPRESSION 1. No acute displaced fracture or traumatic listhesis of the lumbar spine. 2. Marked diffusely decreased bone density. Electronically Signed   By: Tish Frederickson M.D.   On: 03/10/2023 14:07   CT L-SPINE NO CHARGE Result Date: 03/10/2023 CLINICAL DATA:  left flank pain and mid back pain following a fall last night. Pt states he does not remember falling, however he woke up in the floor at about 2am after getting out of bed to go get something to drink. EXAM: CT THORACIC AND LUMBAR SPINE WITHOUT CONTRAST TECHNIQUE: Multidetector CT imaging of the thoracic and lumbar spine was performed without contrast. Multiplanar CT image reconstructions were also generated. RADIATION DOSE REDUCTION: This exam was performed according to the departmental dose-optimization program which includes automated exposure control, adjustment of the mA and/or kV according to patient size and/or use of iterative reconstruction technique. COMPARISON:  MRI thoracic spine 07/20/2022, CT abdomen pelvis the 12/30/2021 FINDINGS: CT THORACIC SPINE FINDINGS Alignment: Similar-appearing compensatory dextroscoliosis of the lumbar spine centered at the T11 level. Vertebrae: Diffusely decreased bone density. Similar-appearing chronic T12 anterior wedge compression fracture. Similar-appearing mild anterior wedge compression deformity of the T11 level. Interval increase in degenerative changes along the T10-T11 level. No acute fracture or focal pathologic process. No severe osseous neural foraminal or central canal stenosis. Paraspinal and other soft tissues: Negative. Disc levels: Multilevel mild degenerative changes of the spine. CT LUMBAR SPINE FINDINGS Segmentation: 5 lumbar type vertebrae. Alignment: Similar-appearing levoscoliosis of the lumbar spine centered at the L2-L3 level.  Vertebrae: Diffusely decreased bone density. Similar-appearing fusion at the L1-L2 levels with marked decreased density but no acute fracture associated. Multilevel severe degenerative changes of the spine. No acute fracture or focal pathologic process. No severe osseous neural foraminal or central canal stenosis. Paraspinal and other soft tissues: Negative. Disc levels: Multilevel intervertebral disc space vacuum phenomenon. IMPRESSION: CT THORACIC SPINE IMPRESSION 1. No acute displaced fracture or traumatic listhesis of the thoracic spine. 2. Interval increase in T10-T11 degenerative changes in a patient with dextroscoliosis of the thoracolumbar spine centered at the T11 level and chronic similar-appearing anterior wedge compression deformity of the T11 level and anterior wedge compression fracture of the T12 level. 3. Marked diffusely decreased bone density. CT LUMBAR SPINE IMPRESSION 1. No acute displaced fracture or traumatic listhesis of the lumbar spine. 2. Marked diffusely decreased bone density. Electronically Signed   By: Tish Frederickson M.D.   On: 03/10/2023 14:07   CT Head Wo Contrast Result Date: 03/10/2023 CLINICAL DATA:  Head trauma, minor (Age >= 65y); Neck trauma (Age >= 65y) Left flank pain and mid back pain following a fall last night. Pt states he does not remember falling, however he woke up in the floor at about 2am after getting out of bed to go get something to drink. EXAM: CT HEAD WITHOUT CONTRAST CT CERVICAL SPINE WITHOUT CONTRAST TECHNIQUE: Multidetector CT imaging of the head and cervical spine was performed following the standard protocol without intravenous contrast. Multiplanar CT image reconstructions of the cervical spine were also generated. RADIATION DOSE REDUCTION: This exam was performed according to the departmental dose-optimization program which includes automated exposure control, adjustment of the mA and/or kV according to patient size and/or use of iterative  reconstruction technique. COMPARISON:  CT head and C-spine 07/15/2022 FINDINGS: CT HEAD FINDINGS Brain: No evidence of large-territorial acute infarction. No parenchymal hemorrhage.  No mass lesion. No extra-axial collection. No mass effect or midline shift. No hydrocephalus. Basilar cisterns are patent. Vascular: No hyperdense vessel. Atherosclerotic calcifications are present within the cavernous internal carotid arteries. Skull: No acute fracture or focal lesion. Sinuses/Orbits: Paranasal sinuses and mastoid air cells are clear. Bilateral lens replacement. Otherwise the orbits are unremarkable. Other: None. CT CERVICAL SPINE FINDINGS Alignment: Normal. Skull base and vertebrae: Diffusely decreased bone density. Multilevel severe degenerative changes of the spine. Associated severe osseous neural foraminal stenosis at the right C3-C4 level. No acute fracture. No aggressive appearing focal osseous lesion or focal pathologic process. Soft tissues and spinal canal: No prevertebral fluid or swelling. No visible canal hematoma. Upper chest: Unremarkable. Other: Atherosclerotic plaque of the aorta and branch. IMPRESSION: 1. No acute intracranial abnormality. 2. No acute displaced fracture or traumatic listhesis of the cervical spine. 3. Multilevel severe degenerative changes of the spine. Associated severe osseous neural foraminal stenosis at the right C3-C4 level. 4.  Aortic Atherosclerosis (ICD10-I70.0). Electronically Signed   By: Tish Frederickson M.D.   On: 03/10/2023 13:59   CT Cervical Spine Wo Contrast Result Date: 03/10/2023 CLINICAL DATA:  Head trauma, minor (Age >= 65y); Neck trauma (Age >= 65y) Left flank pain and mid back pain following a fall last night. Pt states he does not remember falling, however he woke up in the floor at about 2am after getting out of bed to go get something to drink. EXAM: CT HEAD WITHOUT CONTRAST CT CERVICAL SPINE WITHOUT CONTRAST TECHNIQUE: Multidetector CT imaging of the head  and cervical spine was performed following the standard protocol without intravenous contrast. Multiplanar CT image reconstructions of the cervical spine were also generated. RADIATION DOSE REDUCTION: This exam was performed according to the departmental dose-optimization program which includes automated exposure control, adjustment of the mA and/or kV according to patient size and/or use of iterative reconstruction technique. COMPARISON:  CT head and C-spine 07/15/2022 FINDINGS: CT HEAD FINDINGS Brain: No evidence of large-territorial acute infarction. No parenchymal hemorrhage. No mass lesion. No extra-axial collection. No mass effect or midline shift. No hydrocephalus. Basilar cisterns are patent. Vascular: No hyperdense vessel. Atherosclerotic calcifications are present within the cavernous internal carotid arteries. Skull: No acute fracture or focal lesion. Sinuses/Orbits: Paranasal sinuses and mastoid air cells are clear. Bilateral lens replacement. Otherwise the orbits are unremarkable. Other: None. CT CERVICAL SPINE FINDINGS Alignment: Normal. Skull base and vertebrae: Diffusely decreased bone density. Multilevel severe degenerative changes of the spine. Associated severe osseous neural foraminal stenosis at the right C3-C4 level. No acute fracture. No aggressive appearing focal osseous lesion or focal pathologic process. Soft tissues and spinal canal: No prevertebral fluid or swelling. No visible canal hematoma. Upper chest: Unremarkable. Other: Atherosclerotic plaque of the aorta and branch. IMPRESSION: 1. No acute intracranial abnormality. 2. No acute displaced fracture or traumatic listhesis of the cervical spine. 3. Multilevel severe degenerative changes of the spine. Associated severe osseous neural foraminal stenosis at the right C3-C4 level. 4.  Aortic Atherosclerosis (ICD10-I70.0). Electronically Signed   By: Tish Frederickson M.D.   On: 03/10/2023 13:59        Scheduled Meds:  diltiazem  120  mg Oral Daily   enoxaparin (LOVENOX) injection  40 mg Subcutaneous Q24H   feeding supplement  237 mL Oral BID BM   folic acid  1 mg Oral Daily   ketorolac  15 mg Intravenous Q6H   lidocaine  1 patch Transdermal Q24H   methocarbamol  500 mg Oral TID  multivitamin with minerals  1 tablet Oral Daily   pantoprazole  40 mg Oral Daily   polyethylene glycol  17 g Oral Daily   potassium chloride  10 mEq Oral Daily   thiamine  100 mg Oral Daily   timolol  1 drop Both Eyes Daily   venlafaxine XR  75 mg Oral Q breakfast   Continuous Infusions:     LOS: 0 days       Tresa Moore, MD [facility]   If 7PM-7AM, please contact night-coverage  03/12/2023, 10:15 AM

## 2023-03-12 NOTE — ED Notes (Signed)
pt denies needs

## 2023-03-12 NOTE — Evaluation (Signed)
Occupational Therapy Evaluation Patient Details Name: Marcus Coleman MRN: [id number] DOB: [birth year] Today's Date: 03/12/2023   History of Present Illness Pt is an 82 y/o male admitted secondary to sustaining a mechanical fall at home. CT was negative for any acute injuries. PMH including but not limited to atrial fibrillation, not on anticoagulation, GERD, depression, glaucoma, alcohol use disorder.   Clinical Impression   Pt was seen for OT evaluation this date. Prior to hospital admission, pt was lived at home with his wife and MOD I with most tasks. Reports occasional use of cane or walker depending on how he felt, more so for going outdoors. Wife available for some assistance at times. He reports 3 falls in the last few months.   Pt presents to acute OT demonstrating impaired ADL performance and functional mobility 2/2 weakness, impaired balance, safety and low activity tolerance (See OT problem list for additional functional deficits). Pt currently requires CGA for bed mobility, STS and in room mobility from bed<>bathroom using RW. Toilet transfer using grab bar and BSC over top of toilet for increased height with CGA. Hygiene performed seated with SBA. Able to stand at sink to perform hand hygiene with unilateral support for balance with CGA. Pt returned to recliner in room. Reports 4/10 L flank pain with nurse notified. He reports feeling much better today than yesterday, but still remains weak with limited activity tolerance and need for assistance.  Pt would benefit from skilled OT services to address noted impairments and functional limitations (see below for any additional details) in order to maximize safety and independence while minimizing falls risk and caregiver burden. Do anticipate the need for follow up OT services upon acute hospital DC.        If plan is discharge home, recommend the following: A little help with walking and/or transfers;A lot of help with  bathing/dressing/bathroom;Assistance with cooking/housework;Assist for transportation;Help with stairs or ramp for entrance    Functional Status Assessment  Patient has had a recent decline in their functional status and demonstrates the ability to make significant improvements in function in a reasonable and predictable amount of time.  Equipment Recommendations  Other (comment) (defer)    Recommendations for Other Services       Precautions / Restrictions Precautions Precautions: Fall Restrictions Weight Bearing Restrictions Per Provider Order: No      Mobility Bed Mobility Overal bed mobility: Needs Assistance Bed Mobility: Supine to Sit     Supine to sit: Contact guard, Used rails, HOB elevated          Transfers Overall transfer level: Needs assistance Equipment used: Rolling walker (2 wheels) Transfers: Sit to/from Stand Sit to Stand: Contact guard assist           General transfer comment: CGA for bed mobility and STS from low bed height; CGA for in room mobility using RW and for toilet transfer with BSC over toilet      Balance Overall balance assessment: Needs assistance   Sitting balance-Leahy Scale: Good Sitting balance - Comments: steady while seated EOB   Standing balance support: Reliant on assistive device for balance, Bilateral upper extremity supported, During functional activity Standing balance-Leahy Scale: Fair Standing balance comment: reliant on RW for balance with mobility                           ADL either performed or assessed with clinical judgement   ADL Overall ADL's : Needs assistance/impaired  Grooming: Wash/dry hands;Standing;Contact guard Counsellor: Contact guard assist;BSC/3in1;Regular Toilet;Rolling walker (2 wheels);Grab bars   Toileting- Clothing Manipulation and Hygiene: Supervision/safety;Sitting/lateral lean       Functional mobility during ADLs: Contact  guard assist;Rolling walker (2 wheels)       Vision         Perception         Praxis         Pertinent Vitals/Pain Pain Assessment Pain Assessment: 0-10 Pain Score: 4  Pain Location: L flank region Pain Descriptors / Indicators: Sore Pain Intervention(s): Limited activity within patient's tolerance, Monitored during session     Extremity/Trunk Assessment Upper Extremity Assessment Upper Extremity Assessment: Generalized weakness   Lower Extremity Assessment Lower Extremity Assessment: Generalized weakness       Communication Communication Communication: No apparent difficulties   Cognition Arousal: Alert Behavior During Therapy: WFL for tasks assessed/performed Overall Cognitive Status: Within Functional Limits for tasks assessed                                       General Comments  sp02 placed on RA during session and maintained 90-93%    Exercises Other Exercises Other Exercises: Edu in role of acute OT and importance of therapy to maximize strength, safety and IND.   Shoulder Instructions      Home Living Family/patient expects to be discharged to:: Private residence Living Arrangements: Spouse/significant other Available Help at Discharge: Family;Available PRN/intermittently Type of Home: House Home Access: Stairs to enter [employer] of Steps: 3-4 Entrance Stairs-Rails: Right;Left Home Layout: One level     Bathroom Shower/Tub: Walk-in shower         Home Equipment: Agricultural consultant (2 wheels);Cane - single point;Shower seat;Shower seat - built in;Hand held shower head   Additional Comments: has a lift chair, sleeps in the lift chair      Prior Functioning/Environment Prior Level of Function : History of Falls (last six months)             Mobility Comments: ambulates with a cane or RW when outside PRN, no AD walking in house ADLs Comments: requires assistance from wife to wash his back        OT  Problem List: Decreased strength;Pain;Decreased activity tolerance;Impaired balance (sitting and/or standing)      OT Treatment/Interventions: Self-care/ADL training;Therapeutic activities;Therapeutic exercise;Patient/family education;DME and/or AE instruction;Balance training    OT Goals(Current goals can be found in the care plan section) Acute Rehab OT Goals Patient Stated Goal: improve his strength OT Goal Formulation: With patient Time For Goal Achievement: 03/26/23 Potential to Achieve Goals: Good ADL Goals Pt Will Perform Lower Body Bathing: with supervision;sitting/lateral leans;sit to/from stand Pt Will Perform Lower Body Dressing: with supervision;sitting/lateral leans;sit to/from stand Pt Will Transfer to Toilet: with modified independence;ambulating;grab bars;regular height toilet Pt Will Perform Toileting - Clothing Manipulation and hygiene: with modified independence;sit to/from stand;sitting/lateral leans Additional ADL Goal #1: Pt will demo bed mobility with SUP and good safety to return to PLOF.  OT Frequency: Min 1X/week    Co-evaluation              AM-PAC OT "6 Clicks" Daily Activity     Outcome Measure Help from another person eating meals?: None Help from another person taking care of personal grooming?: None Help  from another person toileting, which includes using toliet, bedpan, or urinal?: A Little Help from another person bathing (including washing, rinsing, drying)?: A Lot Help from another person to put on and taking off regular upper body clothing?: None Help from another person to put on and taking off regular lower body clothing?: A Little 6 Click Score: 20   End of Session Equipment Utilized During Treatment: Rolling walker (2 wheels) Nurse Communication: Mobility status  Activity Tolerance: Patient tolerated treatment well Patient left: in chair;with call bell/phone within reach;with chair alarm set;with nursing/sitter in room  OT Visit  Diagnosis: Other abnormalities of gait and mobility (R26.89);Muscle weakness (generalized) (M62.81);History of falling (Z91.81)                Time: 1117-1140 OT Time Calculation (min): 23 min Charges:  OT General Charges $OT Visit: 1 Visit OT Evaluation $OT Eval Moderate Complexity: 1 Mod OT Treatments $Self Care/Home Management : 8-22 mins Jaeshaun Riva, OTR/L 03/12/23, 12:51 PM  Jaxston Chohan E Jessic Standifer 03/12/2023, 12:48 PM

## 2023-03-12 NOTE — ED Notes (Addendum)
This nurse called upstairs ([phone]) to get update on bed status

## 2023-03-13 DIAGNOSIS — W19XXXS Unspecified fall, sequela: Secondary | ICD-10-CM

## 2023-03-13 DIAGNOSIS — I4891 Unspecified atrial fibrillation: Secondary | ICD-10-CM | POA: Diagnosis not present

## 2023-03-13 NOTE — Progress Notes (Signed)
 PT Cancellation Note  Patient Details Name: Marcus Coleman MRN: [id number] DOB: [birth year]   Cancelled Treatment:    Reason Eval/Treat Not Completed: Patient declined, no reason specified. Pt sleeping. Did not arouse to auditory stimulus    [name] [name] 03/13/2023, 12:56 PM

## 2023-03-13 NOTE — Progress Notes (Signed)
 PROGRESS NOTE    Marcus Coleman  FMW:[id number] DOB: [birth year] DOA: 03/10/2023 PCP: [name], MD    Brief Narrative:   82 y.o. male with medical history significant of atrial fibrillation, not on anticoagulation, GERD, depression, glaucoma, alcohol use disorder who presents for evaluation after a fall.  Unknown time down.  Patient states that he woke up approximately 2 AM on the day of presentation on the ground in his house.  Wife came and found him at 5 AM on the ground.  Unable to get himself up.  Of note patient has a history of alcoholism and relapsed a few days ago after being dry for several months.   On presentation patient is hemodynamically stable.  Laboratory investigation reveals mild dehydration.  Extensive CT imaging survey negative for fracture.  Patient has significant rib pain likely as a result of a contusion.  No previous history of recent illness.  No focal numbness or weakness to suggest neurologic injury.   Assessment & Plan:   Principal Problem:   Fall  Fall, presumed mechanical Unknown time down.  No evidence of clinical rhabdomyolysis.  Mild dehydration noted.  Likely as a result of alcohol use relapse.  Fortunately CT imaging survey negative for acute fracture.  No indication for surgical intervention.  Patient does have significant pain and decreased ability to ambulate.  Seen by PT/OT.  Rec SNF.  Patient in agreement.  Pain control improving.  Mobility improving Plan: Pain control.  Pain level improving Continue therapy and mobility efforts Need skilled nursing facility.  Bed search initiated   Alcohol use disorder Per EDP recent relapse after several months being dry No clinical evidence of withdrawal Plan: No withdrawal.  Okay for diet as tolerated.  No indication for telemetry or CIWA protocol at this time.   Paroxysmal atrial fibrillation Patient not on anticoagulation, presumably secondary to risk of falls Plan: Continue home diltiazem     GERD PPI   Depression Venlafaxine    Glaucoma Timolol  drops   DVT prophylaxis: SQ lovenox  Code Status: FULL Family Communication:Spouse at bedside 12/29 Disposition Plan: Status is: Observation The patient will require care spanning > 2 midnights and should be moved to inpatient because: Intractable pain.  Multimodal pain control.  Inability to ambulate.  Need for skilled nursing facility placement.     Level of care: Telemetry Medical  Consultants:  None  Procedures:  None  Antimicrobials: None    Subjective: Seen and examined.  More comfortable this morning.  Objective: Vitals:   03/12/23 0905 03/12/23 1533 03/12/23 2355 03/13/23 0753  BP: 116/68 (!) 91/56 106/60 113/67  Pulse: 75 (!) 52 73 (!) 58  Resp: 19 17 16 17   Temp: 97.7 F (36.5 C) 98.3 F (36.8 C) 98.3 F (36.8 C) 97.6 F (36.4 C)  TempSrc: Oral Oral  Oral  SpO2: 97% 97% 97% 98%  Weight:      Height:        Intake/Output Summary (Last 24 hours) at 03/13/2023 1338 Last data filed at 03/13/2023 0238 Gross per 24 hour  Intake --  Output 500 ml  Net -500 ml   Filed Weights   03/10/23 1021  Weight: 77.1 kg    Examination:  General exam: No acute distress Respiratory system: Lungs clear.  Normal work of breathing.  Room air Cardiovascular system: S1S2, RRR, no murmur, no pedal edema Gastrointestinal system: Soft NT/ND normal BS Central nervous system: Alert and oriented. No focal neurological deficits. Extremities: Symmetric 5 x 5 power.  Gait not assessed Skin: No rashes, lesions or ulcers Psychiatry: Judgement and insight appear normal. Mood & affect appropriate.     Data Reviewed: I have personally reviewed following labs and imaging studies  CBC: Recent Labs  Lab 03/10/23 1132 03/11/23 0549  WBC 8.7 6.0  HGB 14.4 12.0*  HCT 43.5 35.9*  MCV 98.0 97.8  PLT 237 174   Basic Metabolic Panel: Recent Labs  Lab 03/10/23 1132 03/11/23 0549  NA 141 136  K 4.1 4.1  CL 103  99  CO2 26 30  GLUCOSE 92 92  BUN 21 23  CREATININE 0.64 0.74  CALCIUM 9.0 8.5*   GFR: Estimated Creatinine Clearance: 72 mL/min (by C-G formula based on SCr of 0.74 mg/dL). Liver Function Tests: Recent Labs  Lab 03/10/23 1132  AST 27  ALT 30  ALKPHOS 98  BILITOT 1.0  PROT 7.0  ALBUMIN 3.9   Recent Labs  Lab 03/10/23 1132  LIPASE 25   No results for input(s): AMMONIA in the last 168 hours. Coagulation Profile: No results for input(s): INR, PROTIME in the last 168 hours. Cardiac Enzymes: Recent Labs  Lab 03/10/23 1132  CKTOTAL 60   BNP (last 3 results) No results for input(s): PROBNP in the last 8760 hours. HbA1C: No results for input(s): HGBA1C in the last 72 hours. CBG: No results for input(s): GLUCAP in the last 168 hours. Lipid Profile: No results for input(s): CHOL, HDL, LDLCALC, TRIG, CHOLHDL, LDLDIRECT in the last 72 hours. Thyroid  Function Tests: No results for input(s): TSH, T4TOTAL, FREET4, T3FREE, THYROIDAB in the last 72 hours. Anemia Panel: No results for input(s): VITAMINB12, FOLATE, FERRITIN, TIBC, IRON , RETICCTPCT in the last 72 hours. Sepsis Labs: No results for input(s): PROCALCITON, LATICACIDVEN in the last 168 hours.  No results found for this or any previous visit (from the past 240 hours).       Radiology Studies: No results found.       Scheduled Meds:  diltiazem   120 mg Oral Daily   enoxaparin  (LOVENOX ) injection  40 mg Subcutaneous Q24H   feeding supplement  237 mL Oral BID BM   folic acid   1 mg Oral Daily   lidocaine   1 patch Transdermal Q24H   methocarbamol   500 mg Oral TID   multivitamin with minerals  1 tablet Oral Daily   pantoprazole   40 mg Oral Daily   polyethylene glycol  17 g Oral Daily   potassium chloride   10 mEq Oral Daily   thiamine   100 mg Oral Daily   timolol   1 drop Both Eyes Daily   venlafaxine  XR  75 mg Oral Q breakfast   Continuous  Infusions:     LOS: 0 days       Calvin KATHEE Robson, MD [facility]   If 7PM-7AM, please contact night-coverage  03/13/2023, 1:38 PM

## 2023-03-13 NOTE — Progress Notes (Signed)
This was entered in error.  No documentation was given to pt

## 2023-03-13 NOTE — Plan of Care (Signed)
  Problem: Education: Goal: Knowledge of General Education information will improve Description: Including pain rating scale, medication(s)/side effects and non-pharmacologic comfort measures Outcome: Progressing   Problem: Nutrition: Goal: Adequate nutrition will be maintained Outcome: Progressing   Problem: Coping: Goal: Level of anxiety will decrease Outcome: Progressing   Problem: Pain Management: Goal: General experience of comfort will improve Outcome: Progressing   Problem: Safety: Goal: Ability to remain free from injury will improve Outcome: Progressing

## 2023-03-13 NOTE — TOC Progression Note (Signed)
 Transition of Care [facility]) - Progression Note    Patient Details  Name: Marcus Coleman MRN: [id number] Date of Birth: 11-Aug-1940  Transition of Care [facility]) CM/SW Contact  Karel Turpen A Dimple Bastyr, RN Phone Number: 03/13/2023, 11:02 AM  Clinical Narrative:    Chart reviewed.  Currently no bed offers.  I have extended bed search to other facilities in the [place] area.    TOC will continue follow for discharge planning.     Expected Discharge Plan: Skilled Nursing Facility Barriers to Discharge: Continued Medical Work up  Expected Discharge Plan and Services In-house Referral: Clinical Social Work   Post Acute Care Choice: Skilled Nursing Facility Living arrangements for the past 2 months: Single Family Home                                       Social Determinants of Health (SDOH) Interventions SDOH Screenings   Food Insecurity: No Food Insecurity (03/11/2023)  Housing: Unknown (03/11/2023)  Transportation Needs: No Transportation Needs (03/11/2023)  Utilities: Not At Risk (03/11/2023)  Depression (PHQ2-9): Low Risk  (03/11/2023)  Financial Resource Strain: Low Risk  (08/15/2022)   Received from [facility], [facility]  Tobacco Use: Medium Risk (03/10/2023)    Readmission Risk Interventions     No data to display

## 2023-03-13 NOTE — Care Management Important Message (Signed)
 Important Message  Patient Details  Name: Marcus Coleman MRN: [id number] Date of Birth: [birth year]   Important Message Given:  Yes - Medicare IM     Cristela Blue, CMA 03/13/2023, 11:05 AM

## 2023-03-13 NOTE — Plan of Care (Signed)
 Patient without acute distress. No complaints voiced. Will continue to monitor.   Problem: Education: Goal: Knowledge of General Education information will improve Description: Including pain rating scale, medication(s)/side effects and non-pharmacologic comfort measures Outcome: Progressing   Problem: Health Behavior/Discharge Planning: Goal: Ability to manage health-related needs will improve Outcome: Progressing   Problem: Clinical Measurements: Goal: Ability to maintain clinical measurements within normal limits will improve Outcome: Progressing Goal: Will remain free from infection Outcome: Progressing Goal: Diagnostic test results will improve Outcome: Progressing Goal: Respiratory complications will improve Outcome: Progressing Goal: Cardiovascular complication will be avoided Outcome: Progressing   Problem: Activity: Goal: Risk for activity intolerance will decrease Outcome: Progressing   Problem: Nutrition: Goal: Adequate nutrition will be maintained Outcome: Progressing   Problem: Coping: Goal: Level of anxiety will decrease Outcome: Progressing   Problem: Elimination: Goal: Will not experience complications related to bowel motility Outcome: Progressing Goal: Will not experience complications related to urinary retention Outcome: Progressing   Problem: Pain Management: Goal: General experience of comfort will improve Outcome: Progressing   Problem: Safety: Goal: Ability to remain free from injury will improve Outcome: Progressing   Problem: Skin Integrity: Goal: Risk for impaired skin integrity will decrease Outcome: Progressing

## 2023-03-14 DIAGNOSIS — I10 Essential (primary) hypertension: Secondary | ICD-10-CM | POA: Diagnosis not present

## 2023-03-14 DIAGNOSIS — Y92009 Unspecified place in unspecified non-institutional (private) residence as the place of occurrence of the external cause: Secondary | ICD-10-CM

## 2023-03-14 DIAGNOSIS — W19XXXA Unspecified fall, initial encounter: Secondary | ICD-10-CM | POA: Diagnosis not present

## 2023-03-14 LAB — GLUCOSE, CAPILLARY: Glucose-Capillary: 132 mg/dL — ABNORMAL HIGH (ref 70–99)

## 2023-03-14 NOTE — TOC Progression Note (Addendum)
 Transition of Care [facility]) - Progression Note    Patient Details  Name: Marcus Coleman MRN: [id number] Date of Birth: June 28, 1940  Transition of Care [facility]) CM/SW Contact  Ladene Lady, LCSW Phone Number: 03/14/2023, 11:44 AM  Clinical Narrative:   Pt would like to go to [facility]. Auth attempted to be started but no Recent PT notes. PT notified. Alfonso notified.     Expected Discharge Plan: Skilled Nursing Facility Barriers to Discharge: Continued Medical Work up  Expected Discharge Plan and Services In-house Referral: Clinical Social Work   Post Acute Care Choice: Skilled Nursing Facility Living arrangements for the past 2 months: Single Family Home                                       Social Determinants of Health (SDOH) Interventions SDOH Screenings   Food Insecurity: No Food Insecurity (03/11/2023)  Housing: Unknown (03/11/2023)  Transportation Needs: No Transportation Needs (03/11/2023)  Utilities: Not At Risk (03/11/2023)  Depression (PHQ2-9): Low Risk  (03/11/2023)  Financial Resource Strain: Low Risk  (08/15/2022)   Received from [facility], [facility]  Tobacco Use: Medium Risk (03/10/2023)    Readmission Risk Interventions     No data to display

## 2023-03-14 NOTE — Hospital Course (Signed)
 82yo with h/o afib not on AC, glaucoma, depression, and ETOH use d/o presenting with a fall associated with ETOH intoxication.  Imaging negative for fracture.  PT/OT are recommending SNF rehab.

## 2023-03-14 NOTE — Care Management Obs Status (Signed)
 MEDICARE OBSERVATION STATUS NOTIFICATION   Patient Details  Name: EANN CLELAND MRN: [id number] Date of Birth: 1941-03-02   Medicare Observation Status Notification Given:  Yes    Dicie Edelen, LCSW 03/14/2023, 9:40 AM

## 2023-03-14 NOTE — Plan of Care (Signed)
  Problem: Education: Goal: Knowledge of General Education information will improve Description: Including pain rating scale, medication(s)/side effects and non-pharmacologic comfort measures Outcome: Progressing   Problem: Clinical Measurements: Goal: Ability to maintain clinical measurements within normal limits will improve Outcome: Progressing Goal: Will remain free from infection Outcome: Progressing Goal: Respiratory complications will improve Outcome: Progressing Goal: Cardiovascular complication will be avoided Outcome: Progressing   Problem: Nutrition: Goal: Adequate nutrition will be maintained Outcome: Progressing   Problem: Coping: Goal: Level of anxiety will decrease Outcome: Progressing   Problem: Elimination: Goal: Will not experience complications related to urinary retention Outcome: Progressing   Problem: Pain Management: Goal: General experience of comfort will improve Outcome: Progressing   Problem: Safety: Goal: Ability to remain free from injury will improve Outcome: Progressing

## 2023-03-14 NOTE — Plan of Care (Signed)

## 2023-03-14 NOTE — Progress Notes (Signed)
 Progress Note   Patient: Marcus Coleman FMW:[id number] DOB: 15-Oct-1940 DOA: 03/10/2023     0 DOS: the patient was seen and examined on 03/14/2023   Brief hospital course: 82yo with h/o afib not on AC, glaucoma, depression, and ETOH use d/o presenting with a fall associated with ETOH intoxication.  Imaging negative for fracture.  PT/OT are recommending SNF rehab.    Assessment and Plan:  Fall, presumed mechanical Likely as a result of alcohol use relapse CT imaging survey negative for acute fracture Patient does have significant pain and decreased ability to ambulate Seen by PT/OT - recommend SNF He has been accepted for a bed at [facility] and is awaiting insurance authorization   Alcohol use disorder Per EDP recent relapse after several months being dry No clinical evidence of withdrawal Ongoing cessation encouraged His wife has encouraged him to go to [employer] but he is not interested; she has been going to [place] for 3 years   Paroxysmal atrial fibrillation Patient not on anticoagulation, presumably secondary to risk of falls Continue home diltiazem  for rate control   GERD Continue pantoprazole    Depression Continue desvenlafaxine   Glaucoma Continue timolol      Consultants: PT OT TOC team  Procedures: None  Antibiotics: None      Subjective: Reports that he is slowly improving.  Likes the plan for [employer].  Does not think he has a problem with alcohol and anticipates returning to 1 beer and a glass of wine daily after dc from [facility].   Objective: Vitals:   03/13/23 2344 03/14/23 0810  BP: 106/64 113/78  Pulse: 75 76  Resp: 20 16  Temp: 98.2 F (36.8 C) 97.7 F (36.5 C)  SpO2: 93% 92%    Intake/Output Summary (Last 24 hours) at 03/14/2023 1422 Last data filed at 03/14/2023 1419 Gross per 24 hour  Intake 720 ml  Output 800 ml  Net -80 ml   Filed Weights   03/10/23 1021  Weight: 77.1 kg    Exam:  General:  Appears calm  and comfortable and is in NAD Eyes:  EOMI, normal lids, iris ENT:  grossly normal hearing, lips & tongue, mmm Neck:  no LAD, masses or thyromegaly Cardiovascular:  RRR, no m/r/g. No LE edema.  Respiratory:   CTA bilaterally with no wheezes/rales/rhonchi.  Normal respiratory effort. Abdomen:  soft, NT, ND Skin:  no rash or induration seen on limited exam Musculoskeletal:   no bony abnormality Psychiatric:  grossly normal mood and affect, speech fluent and appropriate, AOx3 Neurologic:  CN 2-12 grossly intact, moves all extremities in coordinated fashion  Data Reviewed: I have reviewed the patient's lab results since admission.  Pertinent labs for today include:   None today     Family Communication: None present; I spoke with his son and wife by telephone   Disposition: Status is: Observation The patient remains OBS appropriate and will d/c before 2 midnights.     Time spent: 35 minutes  Unresulted Labs (From admission, onward)     Start     Ordered   03/15/23 0500  CBC with Differential/Platelet  Tomorrow morning,   R        03/14/23 0740   03/15/23 0500  Basic metabolic panel  Tomorrow morning,   R        03/14/23 0740             Author: Delon Herald, MD 03/14/2023 2:22 PM  For on call review [email or web address].

## 2023-03-14 NOTE — TOC Progression Note (Deleted)
 Transition of Care [facility]) - Progression Note    Patient Details  Name: MATHEU PLOEGER MRN: [id number] Date of Birth: 1940/05/06  Transition of Care [facility] CM/SW Contact  Ladene Lady, LCSW Phone Number: 03/14/2023, 9:27 AM  Clinical Narrative:    CSW met with wife and pt at bedside. Wife reports she has a wheelchair, BSC and shower chair at home. She would like pt evaluated for AIR at [facility] and if they are unable to take she would like to accept the bed offer at [facility]. MD and PT messaged to see if this referral could be placed. She would be able to be there with pt 24/7 if AIR.    Expected Discharge Plan: Skilled Nursing Facility Barriers to Discharge: Continued Medical Work up  Expected Discharge Plan and Services In-house Referral: Clinical Social Work   Post Acute Care Choice: Skilled Nursing Facility Living arrangements for the past 2 months: Single Family Home                                       Social Determinants of Health (SDOH) Interventions SDOH Screenings   Food Insecurity: No Food Insecurity (03/11/2023)  Housing: Unknown (03/11/2023)  Transportation Needs: No Transportation Needs (03/11/2023)  Utilities: Not At Risk (03/11/2023)  Depression (PHQ2-9): Low Risk  (03/11/2023)  Financial Resource Strain: Low Risk  (08/15/2022)   Received from [facility], [facility]  Tobacco Use: Medium Risk (03/10/2023)    Readmission Risk Interventions     No data to display

## 2023-03-15 DIAGNOSIS — F1020 Alcohol dependence, uncomplicated: Secondary | ICD-10-CM | POA: Diagnosis present

## 2023-03-15 DIAGNOSIS — W19XXXA Unspecified fall, initial encounter: Secondary | ICD-10-CM | POA: Diagnosis not present

## 2023-03-15 DIAGNOSIS — I10 Essential (primary) hypertension: Secondary | ICD-10-CM | POA: Diagnosis not present

## 2023-03-15 DIAGNOSIS — Y92009 Unspecified place in unspecified non-institutional (private) residence as the place of occurrence of the external cause: Secondary | ICD-10-CM | POA: Diagnosis not present

## 2023-03-15 LAB — BASIC METABOLIC PANEL
Anion gap: 10 (ref 5–15)
BUN: 21 mg/dL (ref 8–23)
CO2: 30 mmol/L (ref 22–32)
Calcium: 8.6 mg/dL — ABNORMAL LOW (ref 8.9–10.3)
Chloride: 96 mmol/L — ABNORMAL LOW (ref 98–111)
Creatinine, Ser: 0.6 mg/dL — ABNORMAL LOW (ref 0.61–1.24)
GFR, Estimated: >60 mL/min (ref >60)
Glucose, Bld: 103 mg/dL — ABNORMAL HIGH (ref 70–99)
Potassium: 4.4 mmol/L (ref 3.5–5.1)
Sodium: 136 mmol/L (ref 135–145)

## 2023-03-15 LAB — CBC WITH DIFFERENTIAL/PLATELET
Abs Immature Granulocytes: 0.02 10*3/uL (ref 0.00–0.07)
Basophils Absolute: 0 10*3/uL (ref 0.0–0.1)
Basophils Relative: 0 %
Eosinophils Absolute: 0.2 10*3/uL (ref 0.0–0.5)
Eosinophils Relative: 2 %
HCT: 34 % — ABNORMAL LOW (ref 39.0–52.0)
Hemoglobin: 11.5 g/dL — ABNORMAL LOW (ref 13.0–17.0)
Immature Granulocytes: 0 %
Lymphocytes Relative: 18 %
Lymphs Abs: 1.1 10*3/uL (ref 0.7–4.0)
MCH: 32.8 pg (ref 26.0–34.0)
MCHC: 33.8 g/dL (ref 30.0–36.0)
MCV: 96.9 fL (ref 80.0–100.0)
Monocytes Absolute: 0.7 10*3/uL (ref 0.1–1.0)
Monocytes Relative: 11 %
Neutro Abs: 4.2 10*3/uL (ref 1.7–7.7)
Neutrophils Relative %: 69 %
Platelets: 182 10*3/uL (ref 150–400)
RBC: 3.51 MIL/uL — ABNORMAL LOW (ref 4.22–5.81)
RDW: 13.2 % (ref 11.5–15.5)
WBC: 6.2 10*3/uL (ref 4.0–10.5)
nRBC: 0 % (ref 0.0–0.2)

## 2023-03-15 NOTE — Plan of Care (Signed)

## 2023-03-15 NOTE — Progress Notes (Signed)
 Physical Therapy Treatment Patient Details Name: Marcus Coleman MRN: [id number] DOB: [birth year] Today's Date: 03/15/2023   History of Present Illness Pt is an 83 y/o male admitted secondary to sustaining a mechanical fall at home. CT was negative for any acute injuries. PMH including but not limited to atrial fibrillation, not on anticoagulation, GERD, depression, glaucoma, alcohol use disorder.    PT Comments  Pt was long sitting in bed upon arrival. He is A and O x 3. Agreeable to session and cooperative throughout. Pt endorses less severe back/L flank pain however still endorsing 4/10. He agrees to OOB activity. Required assistance to safely achieve EOB sitting. Once seated EOB, pt stood form elevated bed height and ambulated ~ 20 ft with RW. Distance limited by pain. Pt is far from his baseline abilities. DC recs remain appropriate to maximize his independence and safety while assisting pt to PLOF.     If plan is discharge home, recommend the following: A little help with walking and/or transfers;A little help with bathing/dressing/bathroom;Assistance with cooking/housework;Direct supervision/assist for medications management;Direct supervision/assist for financial management;Assist for transportation;Help with stairs or ramp for entrance     Equipment Recommendations  Other (comment) (Defer to next level of care)       Precautions / Restrictions Precautions Precautions: Fall Restrictions Weight Bearing Restrictions Per Provider Order: No     Mobility  Bed Mobility Overal bed mobility: Needs Assistance Bed Mobility: Supine to Sit Rolling: Used rails, Contact guard assist Supine to sit: Min assist, Mod assist  General bed mobility comments: min-mod assist mostly due to pain. pt was able to roll L to short sit with VCs.    Transfers Overall transfer level: Needs assistance Equipment used: Rolling walker (2 wheels) Transfers: Sit to/from Stand Sit to Stand: Contact guard assist,  From elevated surface  General transfer comment: CGA for safety to stand from slightly elevated bed height    Ambulation/Gait Ambulation/Gait assistance: Contact guard assist Gait Distance (Feet): 20 Feet Assistive device: Rolling walker (2 wheels) Gait Pattern/deviations: Step-through pattern, Antalgic Gait velocity: decrease  General Gait Details: Pt ambulated ~ 20 ft with RW. no LOB but distance greatly limited by pain    Balance Overall balance assessment: Needs assistance Sitting-balance support: Feet supported Sitting balance-Leahy Scale: Good     Standing balance support: Bilateral upper extremity supported, During functional activity, Reliant on assistive device for balance Standing balance-Leahy Scale: Fair Standing balance comment: reliant on RW for balance with mobility        Cognition Arousal: Alert Behavior During Therapy: WFL for tasks assessed/performed Overall Cognitive Status: Within Functional Limits for tasks assessed    General Comments: Pt is A and O x 3. Agreeable to session and remains pleasant throughout               Pertinent Vitals/Pain Pain Assessment Pain Assessment: 0-10 Pain Score: 4  Faces Pain Scale: Hurts a little bit Pain Location: L flank region Pain Descriptors / Indicators: Sore Pain Intervention(s): Limited activity within patient's tolerance, Monitored during session, Premedicated before session, Repositioned     PT Goals (current goals can now be found in the care plan section) Acute Rehab PT Goals Patient Stated Goal: rehab then home Progress towards PT goals: Progressing toward goals    Frequency    Min 1X/week       AM-PAC PT 6 Clicks Mobility   Outcome Measure  Help needed turning from your back to your side while in a flat bed without using bedrails?:  A Little Help needed moving from lying on your back to sitting on the side of a flat bed without using bedrails?: A Little Help needed moving to and from a  bed to a chair (including a wheelchair)?: A Lot Help needed standing up from a chair using your arms (e.g., wheelchair or bedside chair)?: A Little Help needed to walk in hospital room?: A Little Help needed climbing 3-5 steps with a railing? : A Lot 6 Click Score: 16    End of Session   Activity Tolerance: Patient tolerated treatment well;Patient limited by pain Patient left: in chair;with call bell/phone within reach;with chair alarm set Nurse Communication: Mobility status PT Visit Diagnosis: Other abnormalities of gait and mobility (R26.89);Pain     Time: 9051-9041 PT Time Calculation (min) (ACUTE ONLY): 10 min  Charges:    $Therapeutic Activity: 8-22 mins PT General Charges $$ ACUTE PT VISIT: 1 Visit                    Rankin Essex PTA 03/15/23, 10:16 AM

## 2023-03-15 NOTE — Progress Notes (Signed)
  Progress Note   Patient: Marcus Coleman FMW:[id number] DOB: 1940/10/05 DOA: 03/10/2023     0 DOS: the patient was seen and examined on 03/15/2023   Brief hospital course: 83yo with h/o afib not on AC, glaucoma, depression, and ETOH use d/o presenting with a fall associated with ETOH intoxication.  Imaging negative for fracture.  PT/OT are recommending SNF rehab.    Assessment and Plan:  Fall, presumed mechanical Likely as a result of alcohol use relapse CT imaging survey negative for acute fracture Patient does have significant pain and decreased ability to ambulate Seen by PT/OT - recommend SNF He has been accepted for a bed at [facility] and is awaiting insurance authorization   Alcohol use disorder Per EDP recent relapse after several months being dry No clinical evidence of withdrawal Ongoing cessation encouraged His wife has encouraged him to go to [employer] but he is not interested; she has been going to [facility] for 3 years   Paroxysmal atrial fibrillation Patient not on anticoagulation, presumably secondary to risk of falls Continue home diltiazem  for rate control   GERD Continue pantoprazole    Depression Continue desvenlafaxine   Glaucoma Continue timolol          Consultants: PT OT TOC team   Procedures: None   Antibiotics: None       Subjective: Feeling good today, no complaints   Objective: Vitals:   03/15/23 0149 03/15/23 0724  BP: 121/74 126/76  Pulse: 83 69  Resp: 16 16  Temp: 99.4 F (37.4 C) 97.8 F (36.6 C)  SpO2: 96% 94%    Intake/Output Summary (Last 24 hours) at 03/15/2023 1208 Last data filed at 03/15/2023 0430 Gross per 24 hour  Intake 240 ml  Output 1825 ml  Net -1585 ml   Filed Weights   03/10/23 1021  Weight: 77.1 kg    Exam:  General:  Appears calm and comfortable and is in NAD Eyes:  EOMI, normal lids, iris ENT:  grossly normal hearing, lips & tongue, mmm Neck:  no LAD, masses or thyromegaly Cardiovascular:   RRR, no m/r/g. No LE edema.  Respiratory:   CTA bilaterally with no wheezes/rales/rhonchi.  Normal respiratory effort. Abdomen:  soft, NT, ND Skin:  no rash or induration seen on limited exam Musculoskeletal:   no bony abnormality Psychiatric:  grossly normal mood and affect, speech fluent and appropriate, AOx3 Neurologic:  CN 2-12 grossly intact, moves all extremities in coordinated fashion  Data Reviewed: I have reviewed the patient's lab results since admission.  Pertinent labs for today include:   Unremarkable BMP Stable CBC     Family Communication: None present  Disposition: Status is: Observation The patient remains OBS appropriate and will d/c before 2 midnights.     Time spent: 25 minutes  Unresulted Labs (From admission, onward)    None        Author: Delon Herald, MD 03/15/2023 12:08 PM  For on call review [email or web address].

## 2023-03-15 NOTE — TOC Progression Note (Signed)
 Transition of Care [facility]) - Progression Note    Patient Details  Name: Marcus Coleman MRN: [id number] Date of Birth: 03-03-1941  Transition of Care [facility]) CM/SW Contact  Royanne JINNY Bernheim, RN Phone Number: 03/15/2023, 12:10 PM  Clinical Narrative:    Called [facility] and spoke with [place], Asked for ins auth to go to [facility] as well as EMS for [employer], it is pending    Expected Discharge Plan: Skilled Nursing Facility Barriers to Discharge: Continued Medical Work up  Expected Discharge Plan and Services In-house Referral: Clinical Social Work   Post Acute Care Choice: Skilled Nursing Facility Living arrangements for the past 2 months: Single Family Home                                       Social Determinants of Health (SDOH) Interventions SDOH Screenings   Food Insecurity: No Food Insecurity (03/11/2023)  Housing: Unknown (03/11/2023)  Transportation Needs: No Transportation Needs (03/11/2023)  Utilities: Not At Risk (03/11/2023)  Depression (PHQ2-9): Low Risk  (03/11/2023)  Financial Resource Strain: Low Risk  (08/15/2022)   Received from [facility], [facility]  Tobacco Use: Medium Risk (03/10/2023)    Readmission Risk Interventions     No data to display

## 2023-03-16 DIAGNOSIS — E782 Mixed hyperlipidemia: Secondary | ICD-10-CM | POA: Diagnosis not present

## 2023-03-16 DIAGNOSIS — R062 Wheezing: Secondary | ICD-10-CM | POA: Diagnosis not present

## 2023-03-16 DIAGNOSIS — W19XXXA Unspecified fall, initial encounter: Secondary | ICD-10-CM | POA: Diagnosis not present

## 2023-03-16 DIAGNOSIS — I4811 Longstanding persistent atrial fibrillation: Secondary | ICD-10-CM | POA: Diagnosis not present

## 2023-03-16 DIAGNOSIS — S22080K Wedge compression fracture of T11-T12 vertebra, subsequent encounter for fracture with nonunion: Secondary | ICD-10-CM | POA: Diagnosis not present

## 2023-03-16 DIAGNOSIS — G8929 Other chronic pain: Secondary | ICD-10-CM | POA: Diagnosis not present

## 2023-03-16 DIAGNOSIS — I4819 Other persistent atrial fibrillation: Secondary | ICD-10-CM | POA: Diagnosis not present

## 2023-03-16 DIAGNOSIS — Z86718 Personal history of other venous thrombosis and embolism: Secondary | ICD-10-CM | POA: Diagnosis not present

## 2023-03-16 DIAGNOSIS — R079 Chest pain, unspecified: Secondary | ICD-10-CM | POA: Diagnosis not present

## 2023-03-16 DIAGNOSIS — F32A Depression, unspecified: Secondary | ICD-10-CM | POA: Diagnosis not present

## 2023-03-16 DIAGNOSIS — R2689 Other abnormalities of gait and mobility: Secondary | ICD-10-CM | POA: Diagnosis not present

## 2023-03-16 DIAGNOSIS — J069 Acute upper respiratory infection, unspecified: Secondary | ICD-10-CM | POA: Diagnosis not present

## 2023-03-16 DIAGNOSIS — M4805 Spinal stenosis, thoracolumbar region: Secondary | ICD-10-CM | POA: Diagnosis not present

## 2023-03-16 DIAGNOSIS — I10 Essential (primary) hypertension: Secondary | ICD-10-CM | POA: Diagnosis not present

## 2023-03-16 DIAGNOSIS — K219 Gastro-esophageal reflux disease without esophagitis: Secondary | ICD-10-CM | POA: Diagnosis not present

## 2023-03-16 DIAGNOSIS — R053 Chronic cough: Secondary | ICD-10-CM | POA: Diagnosis not present

## 2023-03-16 DIAGNOSIS — K5901 Slow transit constipation: Secondary | ICD-10-CM | POA: Diagnosis not present

## 2023-03-16 DIAGNOSIS — E66811 Obesity, class 1: Secondary | ICD-10-CM | POA: Diagnosis not present

## 2023-03-16 DIAGNOSIS — Z9181 History of falling: Secondary | ICD-10-CM | POA: Diagnosis not present

## 2023-03-16 DIAGNOSIS — R270 Ataxia, unspecified: Secondary | ICD-10-CM | POA: Diagnosis not present

## 2023-03-16 DIAGNOSIS — Y92009 Unspecified place in unspecified non-institutional (private) residence as the place of occurrence of the external cause: Secondary | ICD-10-CM | POA: Diagnosis not present

## 2023-03-16 DIAGNOSIS — F1020 Alcohol dependence, uncomplicated: Secondary | ICD-10-CM | POA: Diagnosis not present

## 2023-03-16 DIAGNOSIS — R030 Elevated blood-pressure reading, without diagnosis of hypertension: Secondary | ICD-10-CM | POA: Diagnosis not present

## 2023-03-16 DIAGNOSIS — D509 Iron deficiency anemia, unspecified: Secondary | ICD-10-CM | POA: Diagnosis not present

## 2023-03-16 DIAGNOSIS — Z0000 Encounter for general adult medical examination without abnormal findings: Secondary | ICD-10-CM | POA: Diagnosis not present

## 2023-03-16 DIAGNOSIS — H409 Unspecified glaucoma: Secondary | ICD-10-CM | POA: Diagnosis not present

## 2023-03-16 DIAGNOSIS — M6281 Muscle weakness (generalized): Secondary | ICD-10-CM | POA: Diagnosis not present

## 2023-03-16 DIAGNOSIS — M545 Low back pain, unspecified: Secondary | ICD-10-CM | POA: Diagnosis not present

## 2023-03-16 DIAGNOSIS — M5135 Other intervertebral disc degeneration, thoracolumbar region: Secondary | ICD-10-CM | POA: Diagnosis not present

## 2023-03-16 DIAGNOSIS — R0989 Other specified symptoms and signs involving the circulatory and respiratory systems: Secondary | ICD-10-CM | POA: Diagnosis not present

## 2023-03-16 DIAGNOSIS — Z6831 Body mass index (BMI) 31.0-31.9, adult: Secondary | ICD-10-CM | POA: Diagnosis not present

## 2023-03-16 DIAGNOSIS — E6609 Other obesity due to excess calories: Secondary | ICD-10-CM | POA: Diagnosis not present

## 2023-03-16 MED ORDER — FOLIC ACID 1 MG PO TABS: 1.0000 mg | ORAL_TABLET | Freq: Every day | ORAL | Status: AC

## 2023-03-16 NOTE — TOC Progression Note (Addendum)
 Transition of Care [facility]) - Progression Note    Patient Details  Name: Marcus Coleman MRN: [id number] Date of Birth: [birth year]  Transition [facility]) CM/SW Contact  Royanne JINNY Bernheim, RN Phone Number: 03/16/2023, 2:42 PM  Clinical Narrative:     Going to  [facility] room 120 at [place] Call report to [phone]  Called son Maude I explained that EMS was not approved  By ins, he stated that he will call family to see when they can come get the patient to transport to room 120 at [facility] He will call me back in a moment  Update, he called back and stated that the patients' wife is on the way to get him to transport to [facility]  Expected Discharge Plan: Skilled Nursing Facility Barriers to Discharge: Continued Medical Work up  Expected Discharge Plan and Services In-house Referral: Clinical Social Work   Post Acute Care Choice: Skilled Nursing Facility Living arrangements for the past 2 months: Single Family Home Expected Discharge Date: 03/16/23                                     Social Determinants of Health (SDOH) Interventions SDOH Screenings   Food Insecurity: No Food Insecurity (03/11/2023)  Housing: Unknown (03/11/2023)  Transportation Needs: No Transportation Needs (03/11/2023)  Utilities: Not At Risk (03/11/2023)  Depression (PHQ2-9): Low Risk  (03/11/2023)  Financial Resource Strain: Low Risk  (08/15/2022)   Received from [facility], [facility]  Tobacco Use: Medium Risk (03/10/2023)    Readmission Risk Interventions     No data to display

## 2023-03-16 NOTE — Discharge Summary (Signed)
 Physician Discharge Summary   Patient: Marcus Coleman MRN: [id number] DOB: 23-May-1940  Admit date:     03/10/2023  Discharge date: 03/16/23  Discharge Physician: Marcus Coleman   PCP: Marcus Reyes BIRCH, MD   Recommendations at discharge:   You are being discharged to [facility] for rehabilitation STOP drinking alcohol - all beer, wine, liquor!  Discharge Diagnoses: Principal Problem:   Fall at home, initial encounter Active Problems:   Benign prostatic hyperplasia with lower urinary tract symptoms   Persistent atrial fibrillation (HCC)   HTN (hypertension)   Hyperlipidemia   Alcohol use disorder, moderate, dependence [facility])   Hospital Course: 82yo with h/o afib not on AC, glaucoma, depression, and ETOH use d/o presenting with a fall associated with ETOH intoxication.  Imaging negative for fracture.  PT/OT are recommending SNF rehab.    Assessment and Plan:  Fall, presumed mechanical Likely as a result of alcohol use relapse CT imaging survey negative for acute fracture Patient does have significant pain and decreased ability to ambulate Seen by PT/OT - recommend SNF He has been accepted for a bed at [facility] and is awaiting insurance authorization I spoke with Marcus Coleman - he went to [facility] 7 months ago and was reviewed then; he left [place] early and was only walking 40 feet and requiring a fair bit of assistance Marcus Coleman does not believe that a SNF is the best option for him but will approve at this time   Alcohol use disorder Per EDP recent relapse after several months being dry No clinical evidence of withdrawal Ongoing cessation encouraged His wife has encouraged him to go to AA meetings but he is not interested; she has been going to [place] for 3 years She should strongly encourage him to go to AA meetings at [facility] and encourage absolute cessation   Paroxysmal atrial fibrillation Patient not on anticoagulation, presumably secondary to risk of  falls Continue home diltiazem  for rate control   GERD Continue pantoprazole    Depression Continue desvenlafaxine   Glaucoma Continue timolol          Consultants: PT OT TOC team   Procedures: None   Antibiotics: None      Pain control - [address]  Controlled Substance Reporting System database was reviewed. and patient was instructed, not to drive, operate heavy machinery, perform activities at heights, swimming or participation in water activities or provide baby-sitting services while on Pain, Sleep and Anxiety Medications; until their outpatient Physician has advised to do so again. Also recommended to not to take more than prescribed Pain, Sleep and Anxiety Medications.   Disposition: Skilled nursing facility Diet recommendation:  Regular diet DISCHARGE MEDICATION: Allergies as of 03/16/2023       Reactions   Nsaids    Can take Ibuprofen in moderation/high doses or prolonged use causes GI bleed        Medication List     TAKE these medications    acetaminophen  650 MG CR tablet Commonly known as: TYLENOL  Take 650 mg by mouth every 8 (eight) hours as needed for pain.   cetirizine 10 MG tablet Commonly known as: ZYRTEC Take 10 mg by mouth every morning.   clindamycin 1 % external solution Commonly known as: CLEOCIN T Apply 1 % topically as needed. (Apply to head)   desvenlafaxine 100 MG 24 hr tablet Commonly known as: PRISTIQ Take 100 mg by mouth daily.   diltiazem  120 MG 24 hr capsule Commonly known as: CARDIZEM  CD Take  120 mg by mouth daily.   feeding supplement Liqd Take 237 mLs by mouth 2 (two) times daily between meals.   fluticasone  50 MCG/ACT nasal spray Commonly known as: FLONASE  Place 1 spray into both nostrils daily as needed for allergies or rhinitis.   folic acid  1 MG tablet Commonly known as: FOLVITE  Take 1 tablet (1 mg total) by mouth daily. Start taking on: March 17, 2023   lidocaine  5 % Commonly known as:  LIDODERM  Place 1 patch onto the skin daily. Remove & Discard patch within 12 hours or as directed by MD   multivitamin with minerals Tabs tablet Take 1 tablet by mouth daily.   nystatin  cream Commonly known as: MYCOSTATIN  Apply topically 2 (two) times daily.   pantoprazole  40 MG tablet Commonly known as: PROTONIX  Take 1 tablet (40 mg total) by mouth daily.   polyethylene glycol 17 g packet Commonly known as: MIRALAX  / GLYCOLAX  Take 17 g by mouth daily.   potassium chloride  10 MEQ tablet Commonly known as: KLOR-CON  Take 10 mEq by mouth daily.   sodium chloride  0.65 % Soln nasal spray Commonly known as: OCEAN Place 1 spray into both nostrils as needed for congestion.   timolol  0.5 % ophthalmic solution Commonly known as: TIMOPTIC  Place 1 drop into both eyes daily.        Contact information for after-discharge care     Destination     [facility] .   Service: Skilled Nursing Contact information: [address] [place]  [phone]                    Discharge Exam:    Subjective: He is feeling ok and eager to get stronger so that he can go home again.   Objective: Vitals:   03/15/23 1953 03/16/23 0744  BP: 131/73 127/71  Pulse: 70 69  Resp: 20 16  Temp: 98 F (36.7 C) 97.6 F (36.4 C)  SpO2: 96% 96%    Intake/Output Summary (Last 24 hours) at 03/16/2023 1114 Last data filed at 03/16/2023 0424 Gross per 24 hour  Intake --  Output 1550 ml  Net -1550 ml   Filed Weights   03/10/23 1021  Weight: 77.1 kg    Exam:  General:  Appears calm and comfortable and is in NAD Eyes:  EOMI, normal lids, iris ENT:  grossly normal hearing, lips & tongue, mmm Neck:  no LAD, masses or thyromegaly Cardiovascular:  RRR, no m/r/g. No LE edema.  Respiratory:   CTA bilaterally with no wheezes/rales/rhonchi.  Normal respiratory effort. Abdomen:  soft, NT, ND Skin:  no rash or induration seen on limited  exam Musculoskeletal:   no bony abnormality Psychiatric:  grossly normal mood and affect, speech fluent and appropriate, AOx3 Neurologic:  CN 2-12 grossly intact, moves all extremities in coordinated fashion  Data Reviewed: I have reviewed the patient's lab results since admission.  Pertinent labs for today include:  None     Condition at discharge: improving  The results of significant diagnostics from this hospitalization (including imaging, microbiology, ancillary and laboratory) are listed below for reference.   Imaging Studies: CT CHEST ABDOMEN PELVIS W CONTRAST Result Date: 03/10/2023 CLINICAL DATA:  Polytrauma, blunt left flank pain and mid back pain following a fall last night. Pt states he does not remember falling, however he woke up in the floor at about 2am after getting out of bed to go get something to drink. EXAM: CT CHEST, ABDOMEN,  AND PELVIS WITH CONTRAST TECHNIQUE: Multidetector CT imaging of the chest, abdomen and pelvis was performed following the standard protocol during bolus administration of intravenous contrast. RADIATION DOSE REDUCTION: This exam was performed according to the departmental dose-optimization program which includes automated exposure control, adjustment of the mA and/or kV according to patient size and/or use of iterative reconstruction technique. CONTRAST:  80mL OMNIPAQUE  IOHEXOL  300 MG/ML  SOLN COMPARISON:  None Available. FINDINGS: CHEST: Cardiovascular: No aortic injury. The thoracic aorta is normal in caliber. Enlarged right heart. No significant pericardial effusion. Coronary artery calcification. The main pulmonary artery is normal in caliber. No central pulmonary embolus. Limited evaluation more distally due to timing of contrast. Mediastinum/Nodes: No pneumomediastinum. No mediastinal hematoma. The esophagus is unremarkable. Large hiatal hernia containing the majority of the stomach. The thyroid  is unremarkable. The central airways are patent. No  mediastinal, hilar, or axillary lymphadenopathy. Lungs/Pleura: No focal consolidation. No pulmonary nodule. No pulmonary mass. No pulmonary contusion or laceration. No pneumatocele formation. No pleural effusion. No pneumothorax. No hemothorax. Musculoskeletal/Chest wall: No chest wall mass. No acute rib or sternal fracture. Please see separately dictated CT thoracolumbar spine. Total left shoulder arthroplasty. Degenerative changes of the right shoulder. ABDOMEN / PELVIS: Hepatobiliary: Not enlarged. Fluid density lesion of the right hepatic lobe likely a simple hepatic cyst. Grossly similar-appearing subcentimeter hypodensities too small to characterize. No laceration or subcapsular hematoma. The gallbladder is otherwise unremarkable with no radio-opaque gallstones. No biliary ductal dilatation. Pancreas: Diffusely atrophic. Question developing fluid dense lesion along the pancreatic neck/proximal body (2:62). Otherwise normal pancreatic contour. No main pancreatic duct dilatation. Spleen: Not enlarged. No focal lesion. No laceration, subcapsular hematoma, or vascular injury. Adrenals/Urinary Tract: No nodularity bilaterally. Bilateral kidneys enhance symmetrically. Redemonstration of an 8 mm calcification within the left kidney. Simple renal cysts within left kidney likely represent simple renal cysts. Simple renal cysts, in the absence of clinically indicated signs/symptoms, require no independent follow-up. No hydronephrosis. No contusion, laceration, or subcapsular hematoma. No injury to the vascular structures or collecting systems. No hydroureter. The urinary bladder is unremarkable. Stomach/Bowel: No small or large bowel wall thickening or dilatation. The appendix is unremarkable. Vasculature/Lymphatics: Severe atherosclerotic plaque. No abdominal aorta or iliac aneurysm. No active contrast extravasation or pseudoaneurysm. No abdominal, pelvic, inguinal lymphadenopathy. Reproductive: Prostate is  unremarkable. Other: No simple free fluid ascites. No pneumoperitoneum. No hemoperitoneum. No mesenteric hematoma identified. No organized fluid collection. Musculoskeletal: No significant soft tissue hematoma. No acute pelvic fracture. Please see separately dictated CT thoracolumbar spine. Ports and Devices: None. IMPRESSION: 1. No acute intrathoracic, intra-abdominal, intrapelvic traumatic injury. 2. Please see separately dictated CT thoracolumbar spine. Other imaging findings of potential clinical significance: 1. Question developing fluid dense lesion along the pancreatic neck/proximal body-recommend outpatient MR pancreatic protocol further evaluation. 2. Right cardiomegaly. 3. Large hiatal hernia containing the majority of the stomach. 4. Nonobstructive 8 mm left nephrolithiasis. 5. Aortic Atherosclerosis (ICD10-I70.0). Electronically Signed   By: Morgane  Naveau M.D.   On: 03/10/2023 14:29   CT T-SPINE NO CHARGE Result Date: 03/10/2023 CLINICAL DATA:  left flank pain and mid back pain following a fall last night. Pt states he does not remember falling, however he woke up in the floor at about 2am after getting out of bed to go get something to drink. EXAM: CT THORACIC AND LUMBAR SPINE WITHOUT CONTRAST TECHNIQUE: Multidetector CT imaging of the thoracic and lumbar spine was performed without contrast. Multiplanar CT image reconstructions were also generated. RADIATION DOSE REDUCTION: This exam was performed  according to the departmental dose-optimization program which includes automated exposure control, adjustment of the mA and/or kV according to patient size and/or use of iterative reconstruction technique. COMPARISON:  MRI thoracic spine 07/20/2022, CT abdomen pelvis the 12/30/2021 FINDINGS: CT THORACIC SPINE FINDINGS Alignment: Similar-appearing compensatory dextroscoliosis of the lumbar spine centered at the T11 level. Vertebrae: Diffusely decreased bone density. Similar-appearing chronic T12 anterior  wedge compression fracture. Similar-appearing mild anterior wedge compression deformity of the T11 level. Interval increase in degenerative changes along the T10-T11 level. No acute fracture or focal pathologic process. No severe osseous neural foraminal or central canal stenosis. Paraspinal and other soft tissues: Negative. Disc levels: Multilevel mild degenerative changes of the spine. CT LUMBAR SPINE FINDINGS Segmentation: 5 lumbar type vertebrae. Alignment: Similar-appearing levoscoliosis of the lumbar spine centered at the L2-L3 level. Vertebrae: Diffusely decreased bone density. Similar-appearing fusion at the L1-L2 levels with marked decreased density but no acute fracture associated. Multilevel severe degenerative changes of the spine. No acute fracture or focal pathologic process. No severe osseous neural foraminal or central canal stenosis. Paraspinal and other soft tissues: Negative. Disc levels: Multilevel intervertebral disc space vacuum phenomenon. IMPRESSION: CT THORACIC SPINE IMPRESSION 1. No acute displaced fracture or traumatic listhesis of the thoracic spine. 2. Interval increase in T10-T11 degenerative changes in a patient with dextroscoliosis of the thoracolumbar spine centered at the T11 level and chronic similar-appearing anterior wedge compression deformity of the T11 level and anterior wedge compression fracture of the T12 level. 3. Marked diffusely decreased bone density. CT LUMBAR SPINE IMPRESSION 1. No acute displaced fracture or traumatic listhesis of the lumbar spine. 2. Marked diffusely decreased bone density. Electronically Signed   By: Morgane  Naveau M.D.   On: 03/10/2023 14:07   CT L-SPINE NO CHARGE Result Date: 03/10/2023 CLINICAL DATA:  left flank pain and mid back pain following a fall last night. Pt states he does not remember falling, however he woke up in the floor at about 2am after getting out of bed to go get something to drink. EXAM: CT THORACIC AND LUMBAR SPINE  WITHOUT CONTRAST TECHNIQUE: Multidetector CT imaging of the thoracic and lumbar spine was performed without contrast. Multiplanar CT image reconstructions were also generated. RADIATION DOSE REDUCTION: This exam was performed according to the departmental dose-optimization program which includes automated exposure control, adjustment of the mA and/or kV according to patient size and/or use of iterative reconstruction technique. COMPARISON:  MRI thoracic spine 07/20/2022, CT abdomen pelvis the 12/30/2021 FINDINGS: CT THORACIC SPINE FINDINGS Alignment: Similar-appearing compensatory dextroscoliosis of the lumbar spine centered at the T11 level. Vertebrae: Diffusely decreased bone density. Similar-appearing chronic T12 anterior wedge compression fracture. Similar-appearing mild anterior wedge compression deformity of the T11 level. Interval increase in degenerative changes along the T10-T11 level. No acute fracture or focal pathologic process. No severe osseous neural foraminal or central canal stenosis. Paraspinal and other soft tissues: Negative. Disc levels: Multilevel mild degenerative changes of the spine. CT LUMBAR SPINE FINDINGS Segmentation: 5 lumbar type vertebrae. Alignment: Similar-appearing levoscoliosis of the lumbar spine centered at the L2-L3 level. Vertebrae: Diffusely decreased bone density. Similar-appearing fusion at the L1-L2 levels with marked decreased density but no acute fracture associated. Multilevel severe degenerative changes of the spine. No acute fracture or focal pathologic process. No severe osseous neural foraminal or central canal stenosis. Paraspinal and other soft tissues: Negative. Disc levels: Multilevel intervertebral disc space vacuum phenomenon. IMPRESSION: CT THORACIC SPINE IMPRESSION 1. No acute displaced fracture or traumatic listhesis of the thoracic spine. 2.  Interval increase in T10-T11 degenerative changes in a patient with dextroscoliosis of the thoracolumbar spine  centered at the T11 level and chronic similar-appearing anterior wedge compression deformity of the T11 level and anterior wedge compression fracture of the T12 level. 3. Marked diffusely decreased bone density. CT LUMBAR SPINE IMPRESSION 1. No acute displaced fracture or traumatic listhesis of the lumbar spine. 2. Marked diffusely decreased bone density. Electronically Signed   By: Morgane  Naveau M.D.   On: 03/10/2023 14:07   CT Head Wo Contrast Result Date: 03/10/2023 CLINICAL DATA:  Head trauma, minor (Age >= 65y); Neck trauma (Age >= 65y) Left flank pain and mid back pain following a fall last night. Pt states he does not remember falling, however he woke up in the floor at about 2am after getting out of bed to go get something to drink. EXAM: CT HEAD WITHOUT CONTRAST CT CERVICAL SPINE WITHOUT CONTRAST TECHNIQUE: Multidetector CT imaging of the head and cervical spine was performed following the standard protocol without intravenous contrast. Multiplanar CT image reconstructions of the cervical spine were also generated. RADIATION DOSE REDUCTION: This exam was performed according to the departmental dose-optimization program which includes automated exposure control, adjustment of the mA and/or kV according to patient size and/or use of iterative reconstruction technique. COMPARISON:  CT head and C-spine 07/15/2022 FINDINGS: CT HEAD FINDINGS Brain: No evidence of large-territorial acute infarction. No parenchymal hemorrhage. No mass lesion. No extra-axial collection. No mass effect or midline shift. No hydrocephalus. Basilar cisterns are patent. Vascular: No hyperdense vessel. Atherosclerotic calcifications are present within the cavernous internal carotid arteries. Skull: No acute fracture or focal lesion. Sinuses/Orbits: Paranasal sinuses and mastoid air cells are clear. Bilateral lens replacement. Otherwise the orbits are unremarkable. Other: None. CT CERVICAL SPINE FINDINGS Alignment: Normal. Skull base  and vertebrae: Diffusely decreased bone density. Multilevel severe degenerative changes of the spine. Associated severe osseous neural foraminal stenosis at the right C3-C4 level. No acute fracture. No aggressive appearing focal osseous lesion or focal pathologic process. Soft tissues and spinal canal: No prevertebral fluid or swelling. No visible canal hematoma. Upper chest: Unremarkable. Other: Atherosclerotic plaque of the aorta and branch. IMPRESSION: 1. No acute intracranial abnormality. 2. No acute displaced fracture or traumatic listhesis of the cervical spine. 3. Multilevel severe degenerative changes of the spine. Associated severe osseous neural foraminal stenosis at the right C3-C4 level. 4.  Aortic Atherosclerosis (ICD10-I70.0). Electronically Signed   By: Morgane  Naveau M.D.   On: 03/10/2023 13:59   CT Cervical Spine Wo Contrast Result Date: 03/10/2023 CLINICAL DATA:  Head trauma, minor (Age >= 65y); Neck trauma (Age >= 65y) Left flank pain and mid back pain following a fall last night. Pt states he does not remember falling, however he woke up in the floor at about 2am after getting out of bed to go get something to drink. EXAM: CT HEAD WITHOUT CONTRAST CT CERVICAL SPINE WITHOUT CONTRAST TECHNIQUE: Multidetector CT imaging of the head and cervical spine was performed following the standard protocol without intravenous contrast. Multiplanar CT image reconstructions of the cervical spine were also generated. RADIATION DOSE REDUCTION: This exam was performed according to the departmental dose-optimization program which includes automated exposure control, adjustment of the mA and/or kV according to patient size and/or use of iterative reconstruction technique. COMPARISON:  CT head and C-spine 07/15/2022 FINDINGS: CT HEAD FINDINGS Brain: No evidence of large-territorial acute infarction. No parenchymal hemorrhage. No mass lesion. No extra-axial collection. No mass effect or midline shift. No  hydrocephalus.  Basilar cisterns are patent. Vascular: No hyperdense vessel. Atherosclerotic calcifications are present within the cavernous internal carotid arteries. Skull: No acute fracture or focal lesion. Sinuses/Orbits: Paranasal sinuses and mastoid air cells are clear. Bilateral lens replacement. Otherwise the orbits are unremarkable. Other: None. CT CERVICAL SPINE FINDINGS Alignment: Normal. Skull base and vertebrae: Diffusely decreased bone density. Multilevel severe degenerative changes of the spine. Associated severe osseous neural foraminal stenosis at the right C3-C4 level. No acute fracture. No aggressive appearing focal osseous lesion or focal pathologic process. Soft tissues and spinal canal: No prevertebral fluid or swelling. No visible canal hematoma. Upper chest: Unremarkable. Other: Atherosclerotic plaque of the aorta and branch. IMPRESSION: 1. No acute intracranial abnormality. 2. No acute displaced fracture or traumatic listhesis of the cervical spine. 3. Multilevel severe degenerative changes of the spine. Associated severe osseous neural foraminal stenosis at the right C3-C4 level. 4.  Aortic Atherosclerosis (ICD10-I70.0). Electronically Signed   By: Morgane  Naveau M.D.   On: 03/10/2023 13:59    Microbiology: Results for orders placed or performed during the hospital encounter of 07/15/22  SARS Coronavirus 2 by RT PCR (hospital order, performed in [facility] hospital lab) *cepheid single result test* Anterior Nasal Swab     Status: None   Collection Time: 07/15/22  8:40 AM   Specimen: Anterior Nasal Swab  Result Value Ref Range Status   SARS Coronavirus 2 by RT PCR NEGATIVE NEGATIVE Final    Comment: (NOTE) SARS-CoV-2 target nucleic acids are NOT DETECTED.  The SARS-CoV-2 RNA is generally detectable in upper and lower respiratory specimens during the acute phase of infection. The lowest concentration of SARS-CoV-2 viral copies this assay can detect is 250 copies / mL. A  negative result does not preclude SARS-CoV-2 infection and should not be used as the sole basis for treatment or other patient management decisions.  A negative result may occur with improper specimen collection / handling, submission of specimen other than nasopharyngeal swab, presence of viral mutation(s) within the areas targeted by this assay, and inadequate number of viral copies (<250 copies / mL). A negative result must be combined with clinical observations, patient history, and epidemiological information.  Fact Sheet for Patients:   roadlaptop.co.za  Fact Sheet for Healthcare Providers: http://kim-miller.com/  This test is not yet approved or  cleared by the [place]  FDA and has been authorized for detection and/or diagnosis of SARS-CoV-2 by FDA under an Emergency Use Authorization (EUA).  This EUA will remain in effect (meaning this test can be used) for the duration of the COVID-19 declaration under Section 564(b)(1) of the Act, 21 U.S.C. section 360bbb-3(b)(1), unless the authorization is terminated or revoked sooner.  Performed at [facility], [address] [place], [place] [address]     Labs: CBC: Recent Labs  Lab 03/10/23 1132 03/11/23 0549 03/15/23 0422  WBC 8.7 6.0 6.2  NEUTROABS  --   --  4.2  HGB 14.4 12.0* 11.5*  HCT 43.5 35.9* 34.0*  MCV 98.0 97.8 96.9  PLT 237 174 182   Basic Metabolic Panel: Recent Labs  Lab 03/10/23 1132 03/11/23 0549 03/15/23 0422  NA 141 136 136  K 4.1 4.1 4.4  CL 103 99 96*  CO2 26 30 30   GLUCOSE 92 92 103*  BUN 21 23 21   CREATININE 0.64 0.74 0.60*  CALCIUM 9.0 8.5* 8.6*   Liver Function Tests: Recent Labs  Lab 03/10/23 1132  AST 27  ALT 30  ALKPHOS 98  BILITOT 1.0  PROT  7.0  ALBUMIN 3.9   CBG: Recent Labs  Lab 03/14/23 2230  GLUCAP 132*    Discharge time spent: greater than 30 minutes.  Signed: Delon Herald, MD [facility] 03/16/2023

## 2023-03-16 NOTE — TOC Progression Note (Signed)
 Transition of Care [facility]) - Progression Note    Patient Details  Name: ROWIN BAYRON MRN: [id number] Date of Birth: 11/22/1940  Transition of Care [facility]) CM/SW Contact  Royanne JINNY Bernheim, RN Phone Number: 03/16/2023, 10:35 AM  Clinical Narrative:     Camelia with [facility] called and stated that they can accep[t the patient once Ins approved, I called [employer] And left a secure VM for Jori Daring inquiring on the Ins auth, waiting on a response   Expected Discharge Plan: Skilled Nursing Facility Barriers to Discharge: Continued Medical Work up  Expected Discharge Plan and Services In-house Referral: Clinical Social Work   Post Acute Care Choice: Skilled Nursing Facility Living arrangements for the past 2 months: Single Family Home                                       Social Determinants of Health (SDOH) Interventions SDOH Screenings   Food Insecurity: No Food Insecurity (03/11/2023)  Housing: Unknown (03/11/2023)  Transportation Needs: No Transportation Needs (03/11/2023)  Utilities: Not At Risk (03/11/2023)  Depression (PHQ2-9): Low Risk  (03/11/2023)  Financial Resource Strain: Low Risk  (08/15/2022)   Received from [facility], [facility]  Tobacco Use: Medium Risk (03/10/2023)    Readmission Risk Interventions     No data to display

## 2023-03-16 NOTE — TOC Progression Note (Signed)
 Transition of Care [facility]) - Progression Note    Patient Details  Name: Marcus Coleman MRN: [id number] Date of Birth: 1940/08/03  Transition of Care [facility]) CM/SW Contact  Royanne JINNY Bernheim, RN Phone Number: 03/16/2023, 10:38 AM  Clinical Narrative:     Received a call from Jori, At [employer], they denied approval for EMS saying he is walking 20 feet, they are offering a peer to peer for STR, Provided the Physician with the Peer to peer information to call Dr Janit at [phone] and provide the Member ID number   [id number]   Has to be done today by 3 PM  Expected Discharge Plan: Skilled Nursing Facility Barriers to Discharge: Continued Medical Work up  Expected Discharge Plan and Services In-house Referral: Clinical Social Work   Post Acute Care Choice: Skilled Nursing Facility Living arrangements for the past 2 months: Single Family Home                                       Social Determinants of Health (SDOH) Interventions SDOH Screenings   Food Insecurity: No Food Insecurity (03/11/2023)  Housing: Unknown (03/11/2023)  Transportation Needs: No Transportation Needs (03/11/2023)  Utilities: Not At Risk (03/11/2023)  Depression (PHQ2-9): Low Risk  (03/11/2023)  Financial Resource Strain: Low Risk  (08/15/2022)   Received from [facility], [facility]  Tobacco Use: Medium Risk (03/10/2023)    Readmission Risk Interventions     No data to display

## 2023-03-16 NOTE — TOC Progression Note (Signed)
 Transition of Care [facility]) - Progression Note    Patient Details  Name: Marcus Coleman MRN: [id number] Date of Birth: 11-29-1940  Transition of Care [facility]) CM/SW Contact  Royanne JINNY Bernheim, RN Phone Number: 03/16/2023, 2:10 PM  Clinical Narrative:     [employer] called with ins approval [phone], They will not approve EMS transport I called Crystal at [facility] and left another VM asking for bed number  Family will have to transport to [facility] as EMS was not approved Waiting to hear from [facility] before calling family to notify  Expected Discharge Plan: Skilled Nursing Facility Barriers to Discharge: Continued Medical Work up  Expected Discharge Plan and Services In-house Referral: Clinical Social Work   Post Acute Care Choice: Skilled Nursing Facility Living arrangements for the past 2 months: Single Family Home Expected Discharge Date: 03/16/23                                     Social Determinants of Health (SDOH) Interventions SDOH Screenings   Food Insecurity: No Food Insecurity (03/11/2023)  Housing: Unknown (03/11/2023)  Transportation Needs: No Transportation Needs (03/11/2023)  Utilities: Not At Risk (03/11/2023)  Depression (PHQ2-9): Low Risk  (03/11/2023)  Financial Resource Strain: Low Risk  (08/15/2022)   Received from [facility], [facility]  Tobacco Use: Medium Risk (03/10/2023)    Readmission Risk Interventions     No data to display

## 2023-03-19 ENCOUNTER — Encounter: Payer: Self-pay | Admitting: Student

## 2023-03-19 ENCOUNTER — Non-Acute Institutional Stay (SKILLED_NURSING_FACILITY): Payer: Self-pay | Admitting: Student

## 2023-03-19 DIAGNOSIS — G8929 Other chronic pain: Secondary | ICD-10-CM | POA: Diagnosis not present

## 2023-03-19 DIAGNOSIS — M545 Low back pain, unspecified: Secondary | ICD-10-CM

## 2023-03-19 DIAGNOSIS — I4819 Other persistent atrial fibrillation: Secondary | ICD-10-CM | POA: Diagnosis not present

## 2023-03-19 DIAGNOSIS — K5901 Slow transit constipation: Secondary | ICD-10-CM | POA: Diagnosis not present

## 2023-03-19 DIAGNOSIS — S22080K Wedge compression fracture of T11-T12 vertebra, subsequent encounter for fracture with nonunion: Secondary | ICD-10-CM

## 2023-03-19 DIAGNOSIS — F1020 Alcohol dependence, uncomplicated: Secondary | ICD-10-CM | POA: Diagnosis not present

## 2023-03-19 NOTE — Progress Notes (Signed)
 Location:  Other Nursing Home Room Number: [place] - 120 Place of Service:  SNF (31) Provider:  Abdul Costa, Reyes BIRCH, MD  Patient Care Team: Costa Reyes BIRCH, MD as PCP - General (Internal Medicine) Jacobo Evalene PARAS, MD as Consulting Physician (Hematology and Oncology)  Extended Emergency Contact Information Primary Emergency Contact: Mayorquin,PETER Mobile Phone: [phone] Relation: Son Secondary Emergency Contact: Machia,ANTOINETTE Address: [address]          [place], [place] [address] Home Phone: [phone] Mobile Phone: [phone] Relation: Spouse  Code Status:  Full Code Goals of care: Advanced Directive information    03/10/2023   10:23 AM  Advanced Directives  Does Patient Have a Medical Advance Directive? No  Would patient like information on creating a medical advance directive? No - Patient declined     Chief Complaint  Patient presents with   Admission    HPI:  Pt is a 83 y.o. male seen today for an Admission to [facility] for rehab. Patient with PMH of alcohol use disorder who had a fall associated with alcohol intoxication. Imaging negative for fracture. Decreased ability to ambulate secondary to pain.   Discussed the use of AI scribe software for clinical note transcription with the patient, who gave verbal consent to proceed.  History of Present Illness         The patient, with a history of alcohol use disorder, presented with a recent fall at home. They reported waking up to a mess after falling on their way back to the bedroom from the kitchen late at night. The patient has been sleeping in a recliner for convenience. Since their last hospital visit in 2024, they reported an improvement in their condition, being able to resume driving, walking limited distances, and participating in meetings for organizations like the [employer].  The patient reported using a cane for stability when walking outside due to uneven ground, but did not use it much  inside the house. They acknowledged that alcohol consumption likely contributed to their recent fall, admitting to overindulging in scotch after maintaining a guideline of one beer or wine a day. Their son has since removed all alcohol from their home.  The patient reported severe back pain and bruised ribs from the fall, which initially left them unable to move. They have not yet worked with therapy since their admission. They also reported very soft stool due to excessive stool softener use in the hospital, leading to frequent bowel movements after eating.  The patient expressed a desire to live as long as possible, even if it meant undergoing chest compressions in the event of a health crisis. They also expressed willingness to try naltrexone to help decrease their cravings for alcohol. The patient's wife, a designer, jewellery, manages their medications at home.   Past Medical History:  Diagnosis Date   Aortic atherosclerosis (HCC)    Atrial fibrillation (HCC)    a.) CHA2DS2VASc = 6 (age x2, HTN, DVT x2, vascular disease history);  b.) rate/rhythm maintained on oral diltiazem  + metoprolol  succinate; chronic anticoagulation (apixaban ) discontinued due to GI bleeding   BPH with obstruction/lower urinary tract symptoms    Bronchitis 01/27/2022   CAD (coronary artery disease)    Cardiomegaly    Chronic diarrhea    Compression fracture of thoracic vertebra (HCC)    a.) CT renal 12/30/2021: chronic appearing T11-T12 (70% height loss)   DDD (degenerative disc disease), cervical    Diverticulosis    DVT (deep venous thrombosis) (HCC) 03/31/2019  a.) anticoagulation intiated (apixaban ), however subsequently discontinued due to GI bleeding   ETOH abuse    GI bleed    Heart murmur    Hiatal hernia    Hyperlipidemia    Hypertension    IDA (iron  deficiency anemia)    Low testosterone    Nephrolithiasis    Pneumonia    Rotator cuff arthropathy, left    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     Vasovagal syncope    Past Surgical History:  Procedure Laterality Date   boils removed from right forearm  1968   CARDIAC CATHETERIZATION     CATARACT EXTRACTION Left 2008   catracts Bilateral    left 2008, right 2013   CHOLECYSTECTOMY     COLONOSCOPY WITH PROPOFOL  N/A 06/26/2016   Procedure: COLONOSCOPY WITH PROPOFOL ;  Surgeon: Lamar ONEIDA Holmes, MD;  Location: [facility] ENDOSCOPY;  Service: Endoscopy;  Laterality: N/A;   COLONOSCOPY WITH PROPOFOL  N/A 07/03/2019   Procedure: COLONOSCOPY WITH PROPOFOL ;  Surgeon: Therisa Bi, MD;  Location: [facility] ENDOSCOPY;  Service: Gastroenterology;  Laterality: N/A;   ENDOSCOPIC RETROGRADE CHOLANGIOPANCREATOGRAPHY (ERCP) WITH PROPOFOL      ENDOSCOPIC VEIN LASER TREATMENT     right leg 05/2015, left leg 09/2015   ESOPHAGOGASTRODUODENOSCOPY N/A 04/19/2019   Procedure: ESOPHAGOGASTRODUODENOSCOPY (EGD);  Surgeon: Therisa Bi, MD;  Location: [facility] ENDOSCOPY;  Service: Gastroenterology;  Laterality: N/A;   ESOPHAGOGASTRODUODENOSCOPY (EGD) WITH PROPOFOL  N/A 07/03/2019   Procedure: ESOPHAGOGASTRODUODENOSCOPY (EGD) WITH PROPOFOL ;  Surgeon: Therisa Bi, MD;  Location: [facility] ENDOSCOPY;  Service: Gastroenterology;  Laterality: N/A;   ESOPHAGOGASTRODUODENOSCOPY (EGD) WITH PROPOFOL  N/A 08/07/2019   Procedure: ESOPHAGOGASTRODUODENOSCOPY (EGD) WITH PROPOFOL ;  Surgeon: Teressa Toribio SQUIBB, MD;  Location: [facility] ENDOSCOPY;  Service: Endoscopy;  Laterality: N/A;   EUS N/A 08/07/2019   Procedure: UPPER ENDOSCOPIC ULTRASOUND (EUS) RADIAL;  Surgeon: Teressa Toribio SQUIBB, MD;  Location: [facility] ENDOSCOPY;  Service: Endoscopy;  Laterality: N/A;   eyelid surgery     FOOT SURGERY Right 2009   HEMORROIDECTOMY     HERNIA REPAIR     mrercp  2014   NOSE SURGERY  2011   REVERSE SHOULDER ARTHROPLASTY Left 02/09/2022   Procedure: REVERSE SHOULDER ARTHROPLASTY AND BICEPS TENODESIS.;  Surgeon: Edie Norleen PARAS, MD;  Location: [facility] ORS;  Service: Orthopedics;  Laterality: Left;   RHINOPLASTY     squamous cell  carcinoma removed from right hand  2013   TONSILLECTOMY     WISDOM TOOTH EXTRACTION      Allergies  Allergen Reactions   Nsaids     Can take Ibuprofen in moderation/high doses or prolonged use causes GI bleed    Outpatient Encounter Medications as of 03/19/2023  Medication Sig   acetaminophen  (TYLENOL ) 650 MG CR tablet Take 650 mg by mouth every 8 (eight) hours as needed for pain.   cetirizine (ZYRTEC) 10 MG tablet Take 10 mg by mouth every morning.   clindamycin (CLEOCIN T) 1 % external solution Apply 1 % topically as needed. (Apply to head)   desvenlafaxine (PRISTIQ) 100 MG 24 hr tablet Take 100 mg by mouth daily.   diltiazem  (CARDIZEM  CD) 120 MG 24 hr capsule Take 120 mg by mouth daily.   feeding supplement (ENSURE ENLIVE / ENSURE PLUS) LIQD Take 237 mLs by mouth 2 (two) times daily between meals.   fluticasone  (FLONASE ) 50 MCG/ACT nasal spray Place 1 spray into both nostrils daily as needed for allergies or rhinitis.   folic acid  (FOLVITE ) 1 MG tablet Take 1 tablet (1 mg total) by  mouth daily.   lidocaine  (LIDODERM ) 5 % Place 1 patch onto the skin daily. Remove & Discard patch within 12 hours or as directed by MD   Multiple Vitamin (MULTIVITAMIN WITH MINERALS) TABS tablet Take 1 tablet by mouth daily.   nystatin  cream (MYCOSTATIN ) Apply topically 2 (two) times daily.   pantoprazole  (PROTONIX ) 40 MG tablet Take 1 tablet (40 mg total) by mouth daily.   polyethylene glycol (MIRALAX  / GLYCOLAX ) 17 g packet Take 17 g by mouth daily. (Patient not taking: Reported on 03/10/2023)   potassium chloride  (KLOR-CON ) 10 MEQ tablet Take 10 mEq by mouth daily.   sodium chloride  (OCEAN) 0.65 % SOLN nasal spray Place 1 spray into both nostrils as needed for congestion.   timolol  (TIMOPTIC ) 0.5 % ophthalmic solution Place 1 drop into both eyes daily.    No facility-administered encounter medications on file as of 03/19/2023.    Review of Systems  Immunization History  Administered Date(s)  Administered   Fluad Quad(high Dose 65+) 11/21/2018   Influenza Split 01/24/2013, 12/15/2014   Influenza, Seasonal, Injecte, Preservative Fre 12/06/2011   Influenza,inj,Quad PF,6+ Mos 11/19/2017, 12/09/2019   Influenza-Unspecified 12/26/2010, 01/24/2013, 12/18/2013, 11/12/2014, 11/12/2015, 12/09/2015, 12/08/2016, 11/21/2018   PFIZER Comirnaty(Gray Top)Covid-19 Tri-Sucrose Vaccine 03/18/2019, 04/08/2019, 12/15/2019   PFIZER(Purple Top)SARS-COV-2 Vaccination 03/18/2019, 04/08/2019, 12/15/2019, 01/15/2020, 06/22/2020   PPD Test 05/28/2020   Pneumococcal Conjugate-13 09/29/2015   Pneumococcal Polysaccharide-23 03/14/2011, 03/28/2011   Tdap 07/14/2011   Zoster Recombinant(Shingrix) 08/11/2017, 11/19/2017, 10/25/2020   Zoster, Live 12/12/2011   Pertinent  Health Maintenance Due  Topic Date Due   INFLUENZA VACCINE  Completed   Colonoscopy  Discontinued      02/13/2022   10:00 AM 02/13/2022    8:10 PM 02/14/2022   12:00 PM 02/27/2022    1:21 PM 03/02/2022   11:31 AM  Fall Risk  (RETIRED) Patient Fall Risk Level High fall risk High fall risk High fall risk High fall risk High fall risk   Functional Status Survey:    Vitals:   03/19/23 0904  BP: 116/83  Pulse: 74  Resp: 18  Temp: 97.8 F (36.6 C)  SpO2: 91%  Weight: 190 lb 12.8 oz (86.5 kg)   Body mass index is 30.8 kg/m. Physical Exam  MEASUREMENTS: WT- 190 CHEST: Lungs clear to auscultation. CARDIOVASCULAR: Heart sounds regular rate and rhythm. ABDOMEN: Abdomen soft, not distended, good bowel sounds. EXTREMITIES: No leg swelling observed.  Labs reviewed: Recent Labs    07/16/22 0403 07/17/22 0438 07/19/22 0548 07/21/22 0353 03/10/23 1132 03/11/23 0549 03/15/23 0422  NA 140   < > 138   < > 141 136 136  K 3.9   < > 3.5   < > 4.1 4.1 4.4  CL 101   < > 95*   < > 103 99 96*  CO2 32   < > 35*   < > 26 30 30   GLUCOSE 98   < > 113*   < > 92 92 103*  BUN 24*   < > 15   < > 21 23 21   CREATININE 0.82   < > 0.71   < >  0.64 0.74 0.60*  CALCIUM 8.6*   < > 8.6*   < > 9.0 8.5* 8.6*  MG 2.1  --  1.8  --   --   --   --    < > = values in this interval not displayed.   Recent Labs    07/15/22 0024 03/10/23 1132  AST 19  27  ALT 11 30  ALKPHOS 69 98  BILITOT 0.7 1.0  PROT 6.5 7.0  ALBUMIN 3.5 3.9   Recent Labs    07/21/22 0353 10/09/22 1408 01/10/23 1415 03/10/23 1132 03/11/23 0549 03/15/23 0422  WBC 5.5   < > 5.9 8.7 6.0 6.2  NEUTROABS 2.9  --  3.8  --   --  4.2  HGB 11.8*   < > 12.0* 14.4 12.0* 11.5*  HCT 36.9*   < > 36.3* 43.5 35.9* 34.0*  MCV 93.7   < > 99.7 98.0 97.8 96.9  PLT 195   < > 144* 237 174 182   < > = values in this interval not displayed.   Lab Results  Component Value Date   TSH 3.338 02/11/2022   No results found for: HGBA1C No results found for: CHOL, HDL, LDLCALC, LDLDIRECT, TRIG, CHOLHDL  Significant Diagnostic Results in last 30 days:  CT CHEST ABDOMEN PELVIS W CONTRAST Result Date: 03/10/2023 CLINICAL DATA:  Polytrauma, blunt left flank pain and mid back pain following a fall last night. Pt states he does not remember falling, however he woke up in the floor at about 2am after getting out of bed to go get something to drink. EXAM: CT CHEST, ABDOMEN, AND PELVIS WITH CONTRAST TECHNIQUE: Multidetector CT imaging of the chest, abdomen and pelvis was performed following the standard protocol during bolus administration of intravenous contrast. RADIATION DOSE REDUCTION: This exam was performed according to the departmental dose-optimization program which includes automated exposure control, adjustment of the mA and/or kV according to patient size and/or use of iterative reconstruction technique. CONTRAST:  80mL OMNIPAQUE  IOHEXOL  300 MG/ML  SOLN COMPARISON:  None Available. FINDINGS: CHEST: Cardiovascular: No aortic injury. The thoracic aorta is normal in caliber. Enlarged right heart. No significant pericardial effusion. Coronary artery calcification. The main  pulmonary artery is normal in caliber. No central pulmonary embolus. Limited evaluation more distally due to timing of contrast. Mediastinum/Nodes: No pneumomediastinum. No mediastinal hematoma. The esophagus is unremarkable. Large hiatal hernia containing the majority of the stomach. The thyroid  is unremarkable. The central airways are patent. No mediastinal, hilar, or axillary lymphadenopathy. Lungs/Pleura: No focal consolidation. No pulmonary nodule. No pulmonary mass. No pulmonary contusion or laceration. No pneumatocele formation. No pleural effusion. No pneumothorax. No hemothorax. Musculoskeletal/Chest wall: No chest wall mass. No acute rib or sternal fracture. Please see separately dictated CT thoracolumbar spine. Total left shoulder arthroplasty. Degenerative changes of the right shoulder. ABDOMEN / PELVIS: Hepatobiliary: Not enlarged. Fluid density lesion of the right hepatic lobe likely a simple hepatic cyst. Grossly similar-appearing subcentimeter hypodensities too small to characterize. No laceration or subcapsular hematoma. The gallbladder is otherwise unremarkable with no radio-opaque gallstones. No biliary ductal dilatation. Pancreas: Diffusely atrophic. Question developing fluid dense lesion along the pancreatic neck/proximal body (2:62). Otherwise normal pancreatic contour. No main pancreatic duct dilatation. Spleen: Not enlarged. No focal lesion. No laceration, subcapsular hematoma, or vascular injury. Adrenals/Urinary Tract: No nodularity bilaterally. Bilateral kidneys enhance symmetrically. Redemonstration of an 8 mm calcification within the left kidney. Simple renal cysts within left kidney likely represent simple renal cysts. Simple renal cysts, in the absence of clinically indicated signs/symptoms, require no independent follow-up. No hydronephrosis. No contusion, laceration, or subcapsular hematoma. No injury to the vascular structures or collecting systems. No hydroureter. The urinary  bladder is unremarkable. Stomach/Bowel: No small or large bowel wall thickening or dilatation. The appendix is unremarkable. Vasculature/Lymphatics: Severe atherosclerotic plaque. No abdominal aorta or iliac aneurysm. No active contrast  extravasation or pseudoaneurysm. No abdominal, pelvic, inguinal lymphadenopathy. Reproductive: Prostate is unremarkable. Other: No simple free fluid ascites. No pneumoperitoneum. No hemoperitoneum. No mesenteric hematoma identified. No organized fluid collection. Musculoskeletal: No significant soft tissue hematoma. No acute pelvic fracture. Please see separately dictated CT thoracolumbar spine. Ports and Devices: None. IMPRESSION: 1. No acute intrathoracic, intra-abdominal, intrapelvic traumatic injury. 2. Please see separately dictated CT thoracolumbar spine. Other imaging findings of potential clinical significance: 1. Question developing fluid dense lesion along the pancreatic neck/proximal body-recommend outpatient MR pancreatic protocol further evaluation. 2. Right cardiomegaly. 3. Large hiatal hernia containing the majority of the stomach. 4. Nonobstructive 8 mm left nephrolithiasis. 5. Aortic Atherosclerosis (ICD10-I70.0). Electronically Signed   By: Morgane  Naveau M.D.   On: 03/10/2023 14:29   CT T-SPINE NO CHARGE Result Date: 03/10/2023 CLINICAL DATA:  left flank pain and mid back pain following a fall last night. Pt states he does not remember falling, however he woke up in the floor at about 2am after getting out of bed to go get something to drink. EXAM: CT THORACIC AND LUMBAR SPINE WITHOUT CONTRAST TECHNIQUE: Multidetector CT imaging of the thoracic and lumbar spine was performed without contrast. Multiplanar CT image reconstructions were also generated. RADIATION DOSE REDUCTION: This exam was performed according to the departmental dose-optimization program which includes automated exposure control, adjustment of the mA and/or kV according to patient size and/or  use of iterative reconstruction technique. COMPARISON:  MRI thoracic spine 07/20/2022, CT abdomen pelvis the 12/30/2021 FINDINGS: CT THORACIC SPINE FINDINGS Alignment: Similar-appearing compensatory dextroscoliosis of the lumbar spine centered at the T11 level. Vertebrae: Diffusely decreased bone density. Similar-appearing chronic T12 anterior wedge compression fracture. Similar-appearing mild anterior wedge compression deformity of the T11 level. Interval increase in degenerative changes along the T10-T11 level. No acute fracture or focal pathologic process. No severe osseous neural foraminal or central canal stenosis. Paraspinal and other soft tissues: Negative. Disc levels: Multilevel mild degenerative changes of the spine. CT LUMBAR SPINE FINDINGS Segmentation: 5 lumbar type vertebrae. Alignment: Similar-appearing levoscoliosis of the lumbar spine centered at the L2-L3 level. Vertebrae: Diffusely decreased bone density. Similar-appearing fusion at the L1-L2 levels with marked decreased density but no acute fracture associated. Multilevel severe degenerative changes of the spine. No acute fracture or focal pathologic process. No severe osseous neural foraminal or central canal stenosis. Paraspinal and other soft tissues: Negative. Disc levels: Multilevel intervertebral disc space vacuum phenomenon. IMPRESSION: CT THORACIC SPINE IMPRESSION 1. No acute displaced fracture or traumatic listhesis of the thoracic spine. 2. Interval increase in T10-T11 degenerative changes in a patient with dextroscoliosis of the thoracolumbar spine centered at the T11 level and chronic similar-appearing anterior wedge compression deformity of the T11 level and anterior wedge compression fracture of the T12 level. 3. Marked diffusely decreased bone density. CT LUMBAR SPINE IMPRESSION 1. No acute displaced fracture or traumatic listhesis of the lumbar spine. 2. Marked diffusely decreased bone density. Electronically Signed   By: Morgane   Naveau M.D.   On: 03/10/2023 14:07   CT L-SPINE NO CHARGE Result Date: 03/10/2023 CLINICAL DATA:  left flank pain and mid back pain following a fall last night. Pt states he does not remember falling, however he woke up in the floor at about 2am after getting out of bed to go get something to drink. EXAM: CT THORACIC AND LUMBAR SPINE WITHOUT CONTRAST TECHNIQUE: Multidetector CT imaging of the thoracic and lumbar spine was performed without contrast. Multiplanar CT image reconstructions were also generated. RADIATION DOSE REDUCTION: This  exam was performed according to the departmental dose-optimization program which includes automated exposure control, adjustment of the mA and/or kV according to patient size and/or use of iterative reconstruction technique. COMPARISON:  MRI thoracic spine 07/20/2022, CT abdomen pelvis the 12/30/2021 FINDINGS: CT THORACIC SPINE FINDINGS Alignment: Similar-appearing compensatory dextroscoliosis of the lumbar spine centered at the T11 level. Vertebrae: Diffusely decreased bone density. Similar-appearing chronic T12 anterior wedge compression fracture. Similar-appearing mild anterior wedge compression deformity of the T11 level. Interval increase in degenerative changes along the T10-T11 level. No acute fracture or focal pathologic process. No severe osseous neural foraminal or central canal stenosis. Paraspinal and other soft tissues: Negative. Disc levels: Multilevel mild degenerative changes of the spine. CT LUMBAR SPINE FINDINGS Segmentation: 5 lumbar type vertebrae. Alignment: Similar-appearing levoscoliosis of the lumbar spine centered at the L2-L3 level. Vertebrae: Diffusely decreased bone density. Similar-appearing fusion at the L1-L2 levels with marked decreased density but no acute fracture associated. Multilevel severe degenerative changes of the spine. No acute fracture or focal pathologic process. No severe osseous neural foraminal or central canal stenosis. Paraspinal  and other soft tissues: Negative. Disc levels: Multilevel intervertebral disc space vacuum phenomenon. IMPRESSION: CT THORACIC SPINE IMPRESSION 1. No acute displaced fracture or traumatic listhesis of the thoracic spine. 2. Interval increase in T10-T11 degenerative changes in a patient with dextroscoliosis of the thoracolumbar spine centered at the T11 level and chronic similar-appearing anterior wedge compression deformity of the T11 level and anterior wedge compression fracture of the T12 level. 3. Marked diffusely decreased bone density. CT LUMBAR SPINE IMPRESSION 1. No acute displaced fracture or traumatic listhesis of the lumbar spine. 2. Marked diffusely decreased bone density. Electronically Signed   By: Morgane  Naveau M.D.   On: 03/10/2023 14:07   CT Head Wo Contrast Result Date: 03/10/2023 CLINICAL DATA:  Head trauma, minor (Age >= 65y); Neck trauma (Age >= 65y) Left flank pain and mid back pain following a fall last night. Pt states he does not remember falling, however he woke up in the floor at about 2am after getting out of bed to go get something to drink. EXAM: CT HEAD WITHOUT CONTRAST CT CERVICAL SPINE WITHOUT CONTRAST TECHNIQUE: Multidetector CT imaging of the head and cervical spine was performed following the standard protocol without intravenous contrast. Multiplanar CT image reconstructions of the cervical spine were also generated. RADIATION DOSE REDUCTION: This exam was performed according to the departmental dose-optimization program which includes automated exposure control, adjustment of the mA and/or kV according to patient size and/or use of iterative reconstruction technique. COMPARISON:  CT head and C-spine 07/15/2022 FINDINGS: CT HEAD FINDINGS Brain: No evidence of large-territorial acute infarction. No parenchymal hemorrhage. No mass lesion. No extra-axial collection. No mass effect or midline shift. No hydrocephalus. Basilar cisterns are patent. Vascular: No hyperdense vessel.  Atherosclerotic calcifications are present within the cavernous internal carotid arteries. Skull: No acute fracture or focal lesion. Sinuses/Orbits: Paranasal sinuses and mastoid air cells are clear. Bilateral lens replacement. Otherwise the orbits are unremarkable. Other: None. CT CERVICAL SPINE FINDINGS Alignment: Normal. Skull base and vertebrae: Diffusely decreased bone density. Multilevel severe degenerative changes of the spine. Associated severe osseous neural foraminal stenosis at the right C3-C4 level. No acute fracture. No aggressive appearing focal osseous lesion or focal pathologic process. Soft tissues and spinal canal: No prevertebral fluid or swelling. No visible canal hematoma. Upper chest: Unremarkable. Other: Atherosclerotic plaque of the aorta and branch. IMPRESSION: 1. No acute intracranial abnormality. 2. No acute displaced fracture or traumatic  listhesis of the cervical spine. 3. Multilevel severe degenerative changes of the spine. Associated severe osseous neural foraminal stenosis at the right C3-C4 level. 4.  Aortic Atherosclerosis (ICD10-I70.0). Electronically Signed   By: Morgane  Naveau M.D.   On: 03/10/2023 13:59   CT Cervical Spine Wo Contrast Result Date: 03/10/2023 CLINICAL DATA:  Head trauma, minor (Age >= 65y); Neck trauma (Age >= 65y) Left flank pain and mid back pain following a fall last night. Pt states he does not remember falling, however he woke up in the floor at about 2am after getting out of bed to go get something to drink. EXAM: CT HEAD WITHOUT CONTRAST CT CERVICAL SPINE WITHOUT CONTRAST TECHNIQUE: Multidetector CT imaging of the head and cervical spine was performed following the standard protocol without intravenous contrast. Multiplanar CT image reconstructions of the cervical spine were also generated. RADIATION DOSE REDUCTION: This exam was performed according to the departmental dose-optimization program which includes automated exposure control, adjustment of  the mA and/or kV according to patient size and/or use of iterative reconstruction technique. COMPARISON:  CT head and C-spine 07/15/2022 FINDINGS: CT HEAD FINDINGS Brain: No evidence of large-territorial acute infarction. No parenchymal hemorrhage. No mass lesion. No extra-axial collection. No mass effect or midline shift. No hydrocephalus. Basilar cisterns are patent. Vascular: No hyperdense vessel. Atherosclerotic calcifications are present within the cavernous internal carotid arteries. Skull: No acute fracture or focal lesion. Sinuses/Orbits: Paranasal sinuses and mastoid air cells are clear. Bilateral lens replacement. Otherwise the orbits are unremarkable. Other: None. CT CERVICAL SPINE FINDINGS Alignment: Normal. Skull base and vertebrae: Diffusely decreased bone density. Multilevel severe degenerative changes of the spine. Associated severe osseous neural foraminal stenosis at the right C3-C4 level. No acute fracture. No aggressive appearing focal osseous lesion or focal pathologic process. Soft tissues and spinal canal: No prevertebral fluid or swelling. No visible canal hematoma. Upper chest: Unremarkable. Other: Atherosclerotic plaque of the aorta and branch. IMPRESSION: 1. No acute intracranial abnormality. 2. No acute displaced fracture or traumatic listhesis of the cervical spine. 3. Multilevel severe degenerative changes of the spine. Associated severe osseous neural foraminal stenosis at the right C3-C4 level. 4.  Aortic Atherosclerosis (ICD10-I70.0). Electronically Signed   By: Morgane  Naveau M.D.   On: 03/10/2023 13:59  LABS AST: 27 (03/19/2023) ALT: 30 (03/19/2023) Alk Phos: 98 (03/19/2023) Bilirubin: 1.0 (03/19/2023) GFR: >60 (03/19/2023) Protein: 7.0 (03/19/2023) Albumin: 3.9 (03/19/2023)  Assessment/Plan Fall with injury Recent fall at home with resultant back pain and bruising. No loss of consciousness or other neurological symptoms reported. Patient has been managing  independently at home with occasional use of a cane for uneven ground. -Continue with lidocaine  5% patches to the lower back topically for pain management. -Administer Tylenol  650mg  as needed for additional pain control. -Use heat or ice pack every 6 hours as needed for pain. -Engage physical therapy for mobility assessment and training.  Alcohol Use Disorder Patient reports a recent increase in alcohol consumption leading to a fall. Previously had a period of abstinence but has since resumed drinking. Expressed willingness to limit or cease alcohol consumption. -Initiate Naltrexone 25mg  daily for 5 days, then increase to 50mg  daily if well-tolerated, to help reduce cravings for alcohol. -Encourage participation in support groups or other resources to prevent relapse.  Constipation Patient reports soft stools and frequent bowel movements, likely secondary to overuse of Miralax . -Change Miralax  to as needed rather than daily use.  Atrial Firbrillation Rate controlled with diltazem.  - continue diltazem  Code Status Patient  expressed a desire for full resuscitative measures in the event of a cardiac arrest. -Document patient's preference for full code status.  Follow-up Monitor patient's response to pain management strategies and Naltrexone. Reassess bowel regimen as needed. Continue to support patient in efforts to cease alcohol consumption.  Family/ staff Communication: nursing  Labs/tests ordered:  cbc, bmp

## 2023-04-04 ENCOUNTER — Encounter: Payer: Self-pay | Admitting: Student

## 2023-04-04 ENCOUNTER — Non-Acute Institutional Stay (SKILLED_NURSING_FACILITY): Payer: Self-pay | Admitting: Student

## 2023-04-04 DIAGNOSIS — R0989 Other specified symptoms and signs involving the circulatory and respiratory systems: Secondary | ICD-10-CM | POA: Diagnosis not present

## 2023-04-04 DIAGNOSIS — R062 Wheezing: Secondary | ICD-10-CM | POA: Diagnosis not present

## 2023-04-04 NOTE — Progress Notes (Signed)
Location:  [facility].  Nursing Home Room Number: [facility] [address]A Place of Service:  SNF [phone]) Provider:  Dr. Earnestine Mealing  PCP: [name], MD  Patient Care Team: [name], MD as PCP - General (Internal Medicine) Jeralyn Ruths, MD as Consulting Physician (Hematology and Oncology)  Extended Emergency Contact Information Primary Emergency Contact: [facility] Phone: [phone] Relation: Son Secondary Emergency Contact: Mcadory,ANTOINETTE Address: [address]          [place], [place] [address] Home Phone: [phone] Mobile Phone: [phone] Relation: Spouse  Code Status:  Full Code.  Goals of care: Advanced Directive information    04/04/2023   12:37 PM  Advanced Directives  Does Patient Have a Medical Advance Directive? No  Would patient like information on creating a medical advance directive? No - Patient declined     Chief Complaint  Patient presents with   Acute Visit    Nasal Congestion.     HPI:  Pt is a 83 y.o. male seen today for an acute visit for Nasal Congestion.  History of Present Illness The patient, with an unspecified medical history, presented with a new onset of coughing that started the previous night. The cough was associated with pain in the spine and a sensation of congestion. The patient was unsure if the congestion was nasal or not. He had tried using a nasal spray without significant relief. The patient denied any fever or need for supplemental oxygen.  The patient had been ambulating independently, including performing exercises and going to the bathroom. However, he preferred not to leave his room for meals. He had no significant changes in his leg swelling.  The patient had a remote history of smoking. He reported some wheezing throughout the lungs. The patient expressed concern about the possibility of pneumonia and was proactive in seeking medical attention.  - Smoked many years ago  Past Medical  History:  Diagnosis Date   Aortic atherosclerosis (HCC)    Atrial fibrillation (HCC)    a.) CHA2DS2VASc = 6 (age x2, HTN, DVT x2, vascular disease history);  b.) rate/rhythm maintained on oral diltiazem + metoprolol succinate; chronic anticoagulation (apixaban) discontinued due to GI bleeding   BPH with obstruction/lower urinary tract symptoms    Bronchitis 01/27/2022   CAD (coronary artery disease)    Cardiomegaly    Chronic diarrhea    Compression fracture of thoracic vertebra (HCC)    a.) CT renal 12/30/2021: chronic appearing T11-T12 (70% height loss)   DDD (degenerative disc disease), cervical    Diverticulosis    DVT (deep venous thrombosis) (HCC) 03/31/2019   a.) anticoagulation intiated (apixaban), however subsequently discontinued due to GI bleeding   ETOH abuse    GI bleed    Heart murmur    Hiatal hernia    Hyperlipidemia    Hypertension    IDA (iron deficiency anemia)    Low testosterone    Nephrolithiasis    Pneumonia    Rotator cuff arthropathy, left    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    Vasovagal syncope    Past Surgical History:  Procedure Laterality Date   boils removed from right forearm  1968   CARDIAC CATHETERIZATION     CATARACT EXTRACTION Left 2008   catracts Bilateral    left 2008, right 2013   CHOLECYSTECTOMY     COLONOSCOPY WITH PROPOFOL N/A 06/26/2016   Procedure: COLONOSCOPY WITH PROPOFOL;  Surgeon: Scot Jun, MD;  Location: [facility] ENDOSCOPY;  Service: Endoscopy;  Laterality: N/A;   COLONOSCOPY WITH PROPOFOL N/A 07/03/2019   Procedure: COLONOSCOPY WITH PROPOFOL;  Surgeon: Wyline Mood, MD;  Location: [facility] ENDOSCOPY;  Service: Gastroenterology;  Laterality: N/A;   ENDOSCOPIC RETROGRADE CHOLANGIOPANCREATOGRAPHY (ERCP) WITH PROPOFOL     ENDOSCOPIC VEIN LASER TREATMENT     right leg 05/2015, left leg 09/2015   ESOPHAGOGASTRODUODENOSCOPY N/A 04/19/2019   Procedure: ESOPHAGOGASTRODUODENOSCOPY (EGD);  Surgeon: Wyline Mood, MD;  Location: [facility]  ENDOSCOPY;  Service: Gastroenterology;  Laterality: N/A;   ESOPHAGOGASTRODUODENOSCOPY (EGD) WITH PROPOFOL N/A 07/03/2019   Procedure: ESOPHAGOGASTRODUODENOSCOPY (EGD) WITH PROPOFOL;  Surgeon: Wyline Mood, MD;  Location: [facility] ENDOSCOPY;  Service: Gastroenterology;  Laterality: N/A;   ESOPHAGOGASTRODUODENOSCOPY (EGD) WITH PROPOFOL N/A 08/07/2019   Procedure: ESOPHAGOGASTRODUODENOSCOPY (EGD) WITH PROPOFOL;  Surgeon: Rachael Fee, MD;  Location: [facility] ENDOSCOPY;  Service: Endoscopy;  Laterality: N/A;   EUS N/A 08/07/2019   Procedure: UPPER ENDOSCOPIC ULTRASOUND (EUS) RADIAL;  Surgeon: Rachael Fee, MD;  Location: [facility] ENDOSCOPY;  Service: Endoscopy;  Laterality: N/A;   eyelid surgery     FOOT SURGERY Right 2009   HEMORROIDECTOMY     HERNIA REPAIR     mrercp  2014   NOSE SURGERY  2011   REVERSE SHOULDER ARTHROPLASTY Left 02/09/2022   Procedure: REVERSE SHOULDER ARTHROPLASTY AND BICEPS TENODESIS.;  Surgeon: Christena Flake, MD;  Location: [facility] ORS;  Service: Orthopedics;  Laterality: Left;   RHINOPLASTY     squamous cell carcinoma removed from right hand  2013   TONSILLECTOMY     WISDOM TOOTH EXTRACTION      Allergies  Allergen Reactions   Nsaids     Can take Ibuprofen in moderation/high doses or prolonged use causes GI bleed    Outpatient Encounter Medications as of 04/04/2023  Medication Sig   acetaminophen (TYLENOL) 650 MG CR tablet Take 650 mg by mouth every 8 (eight) hours as needed for pain.   albuterol (PROVENTIL) (2.5 MG/3ML) 0.083% nebulizer solution Take 2.5 mg by nebulization every 6 (six) hours as needed for wheezing or shortness of breath. One application inhale every 24 hours as needed.   cetirizine (ZYRTEC) 10 MG tablet Take 10 mg by mouth every morning.   clindamycin (CLEOCIN T) 1 % external solution Apply 1 % topically as needed. (Apply to head)   desvenlafaxine (PRISTIQ) 100 MG 24 hr tablet Take 100 mg by mouth daily.   diltiazem (CARDIZEM CD) 120 MG 24 hr capsule Take  120 mg by mouth daily.   feeding supplement (ENSURE ENLIVE / ENSURE PLUS) LIQD Take 237 mLs by mouth 2 (two) times daily between meals.   fluticasone (FLONASE) 50 MCG/ACT nasal spray Place 1 spray into both nostrils daily as needed for allergies or rhinitis.   folic acid (FOLVITE) 1 MG tablet Take 1 tablet (1 mg total) by mouth daily.   lidocaine (LIDODERM) 5 % Place 1 patch onto the skin daily. Remove & Discard patch within 12 hours or as directed by MD   Multiple Vitamin (MULTIVITAMIN WITH MINERALS) TABS tablet Take 1 tablet by mouth daily.   naltrexone (DEPADE) 50 MG tablet Take 50 mg by mouth daily.   nystatin cream (MYCOSTATIN) Apply topically 2 (two) times daily.   pantoprazole (PROTONIX) 40 MG tablet Take 1 tablet (40 mg total) by mouth daily.   polyethylene glycol (MIRALAX / GLYCOLAX) 17 g packet Take 17 g by mouth daily.   sodium chloride (OCEAN) 0.65 % SOLN nasal spray Place 1 spray into both nostrils as needed for  congestion.   timolol (TIMOPTIC) 0.5 % ophthalmic solution Place 1 drop into both eyes daily.    potassium chloride (KLOR-CON) 10 MEQ tablet Take 10 mEq by mouth daily. (Patient not taking: Reported on 04/04/2023)   No facility-administered encounter medications on file as of 04/04/2023.    Review of Systems  Immunization History  Administered Date(s) Administered   Fluad Quad(high Dose 65+) 11/21/2018   Influenza Split 01/24/2013, 12/15/2014   Influenza, Mdck, Trivalent,PF 6+ MOS(egg free) 01/04/2023   Influenza, Seasonal, Injecte, Preservative Fre 12/06/2011   Influenza,inj,Quad PF,6+ Mos 11/19/2017, 12/09/2019   Influenza-Unspecified 12/26/2010, 01/24/2013, 12/18/2013, 11/12/2014, 11/12/2015, 12/09/2015, 12/08/2016, 11/21/2018   PFIZER Comirnaty(Gray Top)Covid-19 Tri-Sucrose Vaccine 03/18/2019, 04/08/2019, 12/15/2019   PFIZER(Purple Top)SARS-COV-2 Vaccination 03/18/2019, 04/08/2019, 12/15/2019, 01/15/2020, 06/22/2020   PPD Test 05/28/2020   Pneumococcal  Conjugate-13 09/29/2015   Pneumococcal Polysaccharide-23 03/14/2011, 03/28/2011   Tdap 07/14/2011   Unspecified SARS-COV-2 Vaccination 11/16/2020   Zoster Recombinant(Shingrix) 08/11/2017, 11/19/2017, 10/25/2020   Zoster, Live 12/12/2011   Pertinent  Health Maintenance Due  Topic Date Due   INFLUENZA VACCINE  Completed   Colonoscopy  Discontinued      02/13/2022   10:00 AM 02/13/2022    8:10 PM 02/14/2022   12:00 PM 02/27/2022    1:21 PM 03/02/2022   11:31 AM  Fall Risk  (RETIRED) Patient Fall Risk Level High fall risk High fall risk High fall risk High fall risk High fall risk   Functional Status Survey:    Vitals:   04/04/23 1232  BP: 117/82  Pulse: 77  Resp: 18  Temp: (!) 97.1 F (36.2 C)  SpO2: 94%  Weight: 194 lb 6.4 oz (88.2 kg)  Height: 5\' 6"  (1.676 m)   Body mass index is 31.38 kg/m. Physical Exam Physical Exam CHEST: Wheezing throughout lungs. EXTREMITIES: Trace swelling in legs. Labs reviewed: Recent Labs    07/16/22 0403 07/17/22 0438 07/19/22 0548 07/21/22 0353 03/10/23 1132 03/11/23 0549 03/15/23 0422  NA 140   < > 138   < > 141 136 136  K 3.9   < > 3.5   < > 4.1 4.1 4.4  CL 101   < > 95*   < > 103 99 96*  CO2 32   < > 35*   < > 26 30 30   GLUCOSE 98   < > 113*   < > 92 92 103*  BUN 24*   < > 15   < > 21 23 21   CREATININE 0.82   < > 0.71   < > 0.64 0.74 0.60*  CALCIUM 8.6*   < > 8.6*   < > 9.0 8.5* 8.6*  MG 2.1  --  1.8  --   --   --   --    < > = values in this interval not displayed.   Recent Labs    07/15/22 0024 03/10/23 1132  AST 19 27  ALT 11 30  ALKPHOS 69 98  BILITOT 0.7 1.0  PROT 6.5 7.0  ALBUMIN 3.5 3.9   Recent Labs    07/21/22 0353 10/09/22 1408 01/10/23 1415 03/10/23 1132 03/11/23 0549 03/15/23 0422  WBC 5.5   < > 5.9 8.7 6.0 6.2  NEUTROABS 2.9  --  3.8  --   --  4.2  HGB 11.8*   < > 12.0* 14.4 12.0* 11.5*  HCT 36.9*   < > 36.3* 43.5 35.9* 34.0*  MCV 93.7   < > 99.7 98.0 97.8 96.9  PLT 195   < >  144* 237 174  182   < > = values in this interval not displayed.   Lab Results  Component Value Date   TSH 3.338 02/11/2022   No results found for: "HGBA1C" No results found for: "CHOL", "HDL", "LDLCALC", "LDLDIRECT", "TRIG", "CHOLHDL"  Significant Diagnostic Results in last 30 days:  CT CHEST ABDOMEN PELVIS W CONTRAST Result Date: 03/10/2023 CLINICAL DATA:  Polytrauma, blunt left flank pain and mid back pain following a fall last night. Pt states he does not remember falling, however he woke up in the floor at about 2am after getting out of bed to go get something to drink. EXAM: CT CHEST, ABDOMEN, AND PELVIS WITH CONTRAST TECHNIQUE: Multidetector CT imaging of the chest, abdomen and pelvis was performed following the standard protocol during bolus administration of intravenous contrast. RADIATION DOSE REDUCTION: This exam was performed according to the departmental dose-optimization program which includes automated exposure control, adjustment of the mA and/or kV according to patient size and/or use of iterative reconstruction technique. CONTRAST:  80mL OMNIPAQUE IOHEXOL 300 MG/ML  SOLN COMPARISON:  None Available. FINDINGS: CHEST: Cardiovascular: No aortic injury. The thoracic aorta is normal in caliber. Enlarged right heart. No significant pericardial effusion. Coronary artery calcification. The main pulmonary artery is normal in caliber. No central pulmonary embolus. Limited evaluation more distally due to timing of contrast. Mediastinum/Nodes: No pneumomediastinum. No mediastinal hematoma. The esophagus is unremarkable. Large hiatal hernia containing the majority of the stomach. The thyroid is unremarkable. The central airways are patent. No mediastinal, hilar, or axillary lymphadenopathy. Lungs/Pleura: No focal consolidation. No pulmonary nodule. No pulmonary mass. No pulmonary contusion or laceration. No pneumatocele formation. No pleural effusion. No pneumothorax. No hemothorax. Musculoskeletal/Chest wall:  No chest wall mass. No acute rib or sternal fracture. Please see separately dictated CT thoracolumbar spine. Total left shoulder arthroplasty. Degenerative changes of the right shoulder. ABDOMEN / PELVIS: Hepatobiliary: Not enlarged. Fluid density lesion of the right hepatic lobe likely a simple hepatic cyst. Grossly similar-appearing subcentimeter hypodensities too small to characterize. No laceration or subcapsular hematoma. The gallbladder is otherwise unremarkable with no radio-opaque gallstones. No biliary ductal dilatation. Pancreas: Diffusely atrophic. Question developing fluid dense lesion along the pancreatic neck/proximal body (2:62). Otherwise normal pancreatic contour. No main pancreatic duct dilatation. Spleen: Not enlarged. No focal lesion. No laceration, subcapsular hematoma, or vascular injury. Adrenals/Urinary Tract: No nodularity bilaterally. Bilateral kidneys enhance symmetrically. Redemonstration of an 8 mm calcification within the left kidney. Simple renal cysts within left kidney likely represent simple renal cysts. Simple renal cysts, in the absence of clinically indicated signs/symptoms, require no independent follow-up. No hydronephrosis. No contusion, laceration, or subcapsular hematoma. No injury to the vascular structures or collecting systems. No hydroureter. The urinary bladder is unremarkable. Stomach/Bowel: No small or large bowel wall thickening or dilatation. The appendix is unremarkable. Vasculature/Lymphatics: Severe atherosclerotic plaque. No abdominal aorta or iliac aneurysm. No active contrast extravasation or pseudoaneurysm. No abdominal, pelvic, inguinal lymphadenopathy. Reproductive: Prostate is unremarkable. Other: No simple free fluid ascites. No pneumoperitoneum. No hemoperitoneum. No mesenteric hematoma identified. No organized fluid collection. Musculoskeletal: No significant soft tissue hematoma. No acute pelvic fracture. Please see separately dictated CT thoracolumbar  spine. Ports and Devices: None. IMPRESSION: 1. No acute intrathoracic, intra-abdominal, intrapelvic traumatic injury. 2. Please see separately dictated CT thoracolumbar spine. Other imaging findings of potential clinical significance: 1. Question developing fluid dense lesion along the pancreatic neck/proximal body-recommend outpatient MR pancreatic protocol further evaluation. 2. Right cardiomegaly. 3. Large hiatal hernia containing the majority of the  stomach. 4. Nonobstructive 8 mm left nephrolithiasis. 5. Aortic Atherosclerosis (ICD10-I70.0). Electronically Signed   By: Tish Frederickson M.D.   On: 03/10/2023 14:29   CT T-SPINE NO CHARGE Result Date: 03/10/2023 CLINICAL DATA:  left flank pain and mid back pain following a fall last night. Pt states he does not remember falling, however he woke up in the floor at about 2am after getting out of bed to go get something to drink. EXAM: CT THORACIC AND LUMBAR SPINE WITHOUT CONTRAST TECHNIQUE: Multidetector CT imaging of the thoracic and lumbar spine was performed without contrast. Multiplanar CT image reconstructions were also generated. RADIATION DOSE REDUCTION: This exam was performed according to the departmental dose-optimization program which includes automated exposure control, adjustment of the mA and/or kV according to patient size and/or use of iterative reconstruction technique. COMPARISON:  MRI thoracic spine 07/20/2022, CT abdomen pelvis the 12/30/2021 FINDINGS: CT THORACIC SPINE FINDINGS Alignment: Similar-appearing compensatory dextroscoliosis of the lumbar spine centered at the T11 level. Vertebrae: Diffusely decreased bone density. Similar-appearing chronic T12 anterior wedge compression fracture. Similar-appearing mild anterior wedge compression deformity of the T11 level. Interval increase in degenerative changes along the T10-T11 level. No acute fracture or focal pathologic process. No severe osseous neural foraminal or central canal stenosis.  Paraspinal and other soft tissues: Negative. Disc levels: Multilevel mild degenerative changes of the spine. CT LUMBAR SPINE FINDINGS Segmentation: 5 lumbar type vertebrae. Alignment: Similar-appearing levoscoliosis of the lumbar spine centered at the L2-L3 level. Vertebrae: Diffusely decreased bone density. Similar-appearing fusion at the L1-L2 levels with marked decreased density but no acute fracture associated. Multilevel severe degenerative changes of the spine. No acute fracture or focal pathologic process. No severe osseous neural foraminal or central canal stenosis. Paraspinal and other soft tissues: Negative. Disc levels: Multilevel intervertebral disc space vacuum phenomenon. IMPRESSION: CT THORACIC SPINE IMPRESSION 1. No acute displaced fracture or traumatic listhesis of the thoracic spine. 2. Interval increase in T10-T11 degenerative changes in a patient with dextroscoliosis of the thoracolumbar spine centered at the T11 level and chronic similar-appearing anterior wedge compression deformity of the T11 level and anterior wedge compression fracture of the T12 level. 3. Marked diffusely decreased bone density. CT LUMBAR SPINE IMPRESSION 1. No acute displaced fracture or traumatic listhesis of the lumbar spine. 2. Marked diffusely decreased bone density. Electronically Signed   By: Tish Frederickson M.D.   On: 03/10/2023 14:07   CT L-SPINE NO CHARGE Result Date: 03/10/2023 CLINICAL DATA:  left flank pain and mid back pain following a fall last night. Pt states he does not remember falling, however he woke up in the floor at about 2am after getting out of bed to go get something to drink. EXAM: CT THORACIC AND LUMBAR SPINE WITHOUT CONTRAST TECHNIQUE: Multidetector CT imaging of the thoracic and lumbar spine was performed without contrast. Multiplanar CT image reconstructions were also generated. RADIATION DOSE REDUCTION: This exam was performed according to the departmental dose-optimization program which  includes automated exposure control, adjustment of the mA and/or kV according to patient size and/or use of iterative reconstruction technique. COMPARISON:  MRI thoracic spine 07/20/2022, CT abdomen pelvis the 12/30/2021 FINDINGS: CT THORACIC SPINE FINDINGS Alignment: Similar-appearing compensatory dextroscoliosis of the lumbar spine centered at the T11 level. Vertebrae: Diffusely decreased bone density. Similar-appearing chronic T12 anterior wedge compression fracture. Similar-appearing mild anterior wedge compression deformity of the T11 level. Interval increase in degenerative changes along the T10-T11 level. No acute fracture or focal pathologic process. No severe osseous neural foraminal or central  canal stenosis. Paraspinal and other soft tissues: Negative. Disc levels: Multilevel mild degenerative changes of the spine. CT LUMBAR SPINE FINDINGS Segmentation: 5 lumbar type vertebrae. Alignment: Similar-appearing levoscoliosis of the lumbar spine centered at the L2-L3 level. Vertebrae: Diffusely decreased bone density. Similar-appearing fusion at the L1-L2 levels with marked decreased density but no acute fracture associated. Multilevel severe degenerative changes of the spine. No acute fracture or focal pathologic process. No severe osseous neural foraminal or central canal stenosis. Paraspinal and other soft tissues: Negative. Disc levels: Multilevel intervertebral disc space vacuum phenomenon. IMPRESSION: CT THORACIC SPINE IMPRESSION 1. No acute displaced fracture or traumatic listhesis of the thoracic spine. 2. Interval increase in T10-T11 degenerative changes in a patient with dextroscoliosis of the thoracolumbar spine centered at the T11 level and chronic similar-appearing anterior wedge compression deformity of the T11 level and anterior wedge compression fracture of the T12 level. 3. Marked diffusely decreased bone density. CT LUMBAR SPINE IMPRESSION 1. No acute displaced fracture or traumatic listhesis  of the lumbar spine. 2. Marked diffusely decreased bone density. Electronically Signed   By: Tish Frederickson M.D.   On: 03/10/2023 14:07   CT Head Wo Contrast Result Date: 03/10/2023 CLINICAL DATA:  Head trauma, minor (Age >= 65y); Neck trauma (Age >= 65y) Left flank pain and mid back pain following a fall last night. Pt states he does not remember falling, however he woke up in the floor at about 2am after getting out of bed to go get something to drink. EXAM: CT HEAD WITHOUT CONTRAST CT CERVICAL SPINE WITHOUT CONTRAST TECHNIQUE: Multidetector CT imaging of the head and cervical spine was performed following the standard protocol without intravenous contrast. Multiplanar CT image reconstructions of the cervical spine were also generated. RADIATION DOSE REDUCTION: This exam was performed according to the departmental dose-optimization program which includes automated exposure control, adjustment of the mA and/or kV according to patient size and/or use of iterative reconstruction technique. COMPARISON:  CT head and C-spine 07/15/2022 FINDINGS: CT HEAD FINDINGS Brain: No evidence of large-territorial acute infarction. No parenchymal hemorrhage. No mass lesion. No extra-axial collection. No mass effect or midline shift. No hydrocephalus. Basilar cisterns are patent. Vascular: No hyperdense vessel. Atherosclerotic calcifications are present within the cavernous internal carotid arteries. Skull: No acute fracture or focal lesion. Sinuses/Orbits: Paranasal sinuses and mastoid air cells are clear. Bilateral lens replacement. Otherwise the orbits are unremarkable. Other: None. CT CERVICAL SPINE FINDINGS Alignment: Normal. Skull base and vertebrae: Diffusely decreased bone density. Multilevel severe degenerative changes of the spine. Associated severe osseous neural foraminal stenosis at the right C3-C4 level. No acute fracture. No aggressive appearing focal osseous lesion or focal pathologic process. Soft tissues and  spinal canal: No prevertebral fluid or swelling. No visible canal hematoma. Upper chest: Unremarkable. Other: Atherosclerotic plaque of the aorta and branch. IMPRESSION: 1. No acute intracranial abnormality. 2. No acute displaced fracture or traumatic listhesis of the cervical spine. 3. Multilevel severe degenerative changes of the spine. Associated severe osseous neural foraminal stenosis at the right C3-C4 level. 4.  Aortic Atherosclerosis (ICD10-I70.0). Electronically Signed   By: Tish Frederickson M.D.   On: 03/10/2023 13:59   CT Cervical Spine Wo Contrast Result Date: 03/10/2023 CLINICAL DATA:  Head trauma, minor (Age >= 65y); Neck trauma (Age >= 65y) Left flank pain and mid back pain following a fall last night. Pt states he does not remember falling, however he woke up in the floor at about 2am after getting out of bed to go get  something to drink. EXAM: CT HEAD WITHOUT CONTRAST CT CERVICAL SPINE WITHOUT CONTRAST TECHNIQUE: Multidetector CT imaging of the head and cervical spine was performed following the standard protocol without intravenous contrast. Multiplanar CT image reconstructions of the cervical spine were also generated. RADIATION DOSE REDUCTION: This exam was performed according to the departmental dose-optimization program which includes automated exposure control, adjustment of the mA and/or kV according to patient size and/or use of iterative reconstruction technique. COMPARISON:  CT head and C-spine 07/15/2022 FINDINGS: CT HEAD FINDINGS Brain: No evidence of large-territorial acute infarction. No parenchymal hemorrhage. No mass lesion. No extra-axial collection. No mass effect or midline shift. No hydrocephalus. Basilar cisterns are patent. Vascular: No hyperdense vessel. Atherosclerotic calcifications are present within the cavernous internal carotid arteries. Skull: No acute fracture or focal lesion. Sinuses/Orbits: Paranasal sinuses and mastoid air cells are clear. Bilateral lens  replacement. Otherwise the orbits are unremarkable. Other: None. CT CERVICAL SPINE FINDINGS Alignment: Normal. Skull base and vertebrae: Diffusely decreased bone density. Multilevel severe degenerative changes of the spine. Associated severe osseous neural foraminal stenosis at the right C3-C4 level. No acute fracture. No aggressive appearing focal osseous lesion or focal pathologic process. Soft tissues and spinal canal: No prevertebral fluid or swelling. No visible canal hematoma. Upper chest: Unremarkable. Other: Atherosclerotic plaque of the aorta and branch. IMPRESSION: 1. No acute intracranial abnormality. 2. No acute displaced fracture or traumatic listhesis of the cervical spine. 3. Multilevel severe degenerative changes of the spine. Associated severe osseous neural foraminal stenosis at the right C3-C4 level. 4.  Aortic Atherosclerosis (ICD10-I70.0). Electronically Signed   By: Tish Frederickson M.D.   On: 03/10/2023 13:59    Assessment/Plan Assessment and Plan Cough with Wheezing Reports a cough that started last night, accompanied by spinal pain when coughing. Wheezing noted throughout the lungs on examination. Pneumonia considered less likely due to absence of fever and comfortable appearance. Discussed risks of untreated wheezing, including potential for worsening symptoms and respiratory distress. Benefits of albuterol include bronchodilation and reduced wheezing. Alternative treatments include nasal saline spray for congestion. - Prescribe albuterol inhaler every six hours for one day, then as needed - Order nasal saline spray - CXR, CBC, BNP  Risk of Pneumonia Increased risk of pneumonia due to limited mobility and potential inadequate lung expansion. Emphasized importance of mobility to prevent pneumonia. No fever and not on oxygen, making pneumonia less likely, but risk remains due to current condition. Discussed risks of pneumonia, including respiratory failure and prolonged  hospitalization. Benefits of increased mobility include improved lung expansion and reduced risk of pneumonia. - Encourage increased mobility - Consider further evaluation if symptoms persist or worsen  General Health Maintenance Encouraged regular physical activity to prevent complications such as pneumonia. Discussed benefits of regular physical activity, including improved overall health and reduced risk of complications. - Encourage regular physical activity and mobility - Use nasal saline spray for congestion as needed.  Family/ staff Communication: nursing  Labs/tests ordered:  outlined above

## 2023-04-05 LAB — BASIC METABOLIC PANEL
BUN: 22 — AB (ref 4–21)
CO2: 32 — AB (ref 13–22)
Chloride: 99 (ref 99–108)
Creatinine: 0.6 (ref 0.6–1.3)
Glucose: 98
Potassium: 4.6 meq/L (ref 3.5–5.1)
Sodium: 137 (ref 137–147)

## 2023-04-05 LAB — CBC AND DIFFERENTIAL
HCT: 37 — AB (ref 41–53)
Hemoglobin: 12.3 — AB (ref 13.5–17.5)
Neutrophils Absolute: 3999
Platelets: 155 10*3/uL (ref 150–400)
WBC: 6.2

## 2023-04-05 LAB — COMPREHENSIVE METABOLIC PANEL
Calcium: 9.2 (ref 8.7–10.7)
eGFR: 96

## 2023-04-05 LAB — CBC: RBC: 3.71 — AB (ref 3.87–5.11)

## 2023-04-10 ENCOUNTER — Encounter: Payer: Self-pay | Admitting: Nurse Practitioner

## 2023-04-10 ENCOUNTER — Non-Acute Institutional Stay (SKILLED_NURSING_FACILITY): Payer: PPO | Admitting: Nurse Practitioner

## 2023-04-10 DIAGNOSIS — I10 Essential (primary) hypertension: Secondary | ICD-10-CM | POA: Diagnosis not present

## 2023-04-10 DIAGNOSIS — M545 Low back pain, unspecified: Secondary | ICD-10-CM | POA: Diagnosis not present

## 2023-04-10 DIAGNOSIS — G8929 Other chronic pain: Secondary | ICD-10-CM

## 2023-04-10 DIAGNOSIS — F1020 Alcohol dependence, uncomplicated: Secondary | ICD-10-CM | POA: Diagnosis not present

## 2023-04-10 DIAGNOSIS — K5901 Slow transit constipation: Secondary | ICD-10-CM

## 2023-04-10 DIAGNOSIS — I4819 Other persistent atrial fibrillation: Secondary | ICD-10-CM | POA: Diagnosis not present

## 2023-04-10 DIAGNOSIS — J069 Acute upper respiratory infection, unspecified: Secondary | ICD-10-CM

## 2023-04-10 NOTE — Progress Notes (Signed)
Location:  Other [facility] [place].  Nursing Home Room Number: [facility] 120A Place of Service:  SNF (31) Abbey Chatters, NP  PCP: [name], MD  Patient Care Team: [name], MD as PCP - General (Internal Medicine) Jeralyn Ruths, MD as Consulting Physician (Hematology and Oncology)  Extended Emergency Contact Information Primary Emergency Contact: Nordgren,PETER Mobile Phone: [phone] Relation: Son Secondary Emergency Contact: Fujita,ANTOINETTE Address: [address]          [place], [place] [address] Home Phone: [phone] Mobile Phone: [phone] Relation: Spouse  Goals of care: Advanced Directive information    04/10/2023    8:38 AM  Advanced Directives  Does Patient Have a Medical Advance Directive? No  Would patient like information on creating a medical advance directive? No - Patient declined     Chief Complaint  Patient presents with   Discharge Note    Discharge.     HPI:  Pt is a 83 y.o. male seen today for Discharge.  Plan to discharge home from [facility] in 2 days He is here after fall with increase in pain. No current back pain.  Ambulating well with walker.   He is refusing naltrexone and desvanlafaxne   He was drinking alcohol prior to fall- reports he was having 2 drinks a day. Denies cravings for alcohol plans to stop drinking alcohol. He reports he went 3 months without alcohol and does not report any issues  He reports he is not having any depression, does not wish to take medication for this at this time.   He plans to discharge in 2 days and will see Dr Judithann Sheen later that day so plans to discuss everything with him.   Moving bowels well.   Afib- rate controlled, not on eliquis due to frequent falls and hx of GI bleed.   Wearing compression socks. No LE edema.   He lives with his wife at home and is a retired Engineer, civil (consulting). Feels like he has a good support system.     Past Medical History:  Diagnosis Date   Aortic  atherosclerosis (HCC)    Atrial fibrillation (HCC)    a.) CHA2DS2VASc = 6 (age x2, HTN, DVT x2, vascular disease history);  b.) rate/rhythm maintained on oral diltiazem + metoprolol succinate; chronic anticoagulation (apixaban) discontinued due to GI bleeding   BPH with obstruction/lower urinary tract symptoms    Bronchitis 01/27/2022   CAD (coronary artery disease)    Cardiomegaly    Chronic diarrhea    Compression fracture of thoracic vertebra (HCC)    a.) CT renal 12/30/2021: chronic appearing T11-T12 (70% height loss)   DDD (degenerative disc disease), cervical    Diverticulosis    DVT (deep venous thrombosis) (HCC) 03/31/2019   a.) anticoagulation intiated (apixaban), however subsequently discontinued due to GI bleeding   ETOH abuse    GI bleed    Heart murmur    Hiatal hernia    Hyperlipidemia    Hypertension    IDA (iron deficiency anemia)    Low testosterone    Nephrolithiasis    Pneumonia    Rotator cuff arthropathy, left    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    Vasovagal syncope    Past Surgical History:  Procedure Laterality Date   boils removed from right forearm  1968   CARDIAC CATHETERIZATION     CATARACT EXTRACTION Left 2008   catracts Bilateral    left 2008, right 2013   CHOLECYSTECTOMY  COLONOSCOPY WITH PROPOFOL N/A 06/26/2016   Procedure: COLONOSCOPY WITH PROPOFOL;  Surgeon: Scot Jun, MD;  Location: [facility] ENDOSCOPY;  Service: Endoscopy;  Laterality: N/A;   COLONOSCOPY WITH PROPOFOL N/A 07/03/2019   Procedure: COLONOSCOPY WITH PROPOFOL;  Surgeon: Wyline Mood, MD;  Location: [facility] ENDOSCOPY;  Service: Gastroenterology;  Laterality: N/A;   ENDOSCOPIC RETROGRADE CHOLANGIOPANCREATOGRAPHY (ERCP) WITH PROPOFOL     ENDOSCOPIC VEIN LASER TREATMENT     right leg 05/2015, left leg 09/2015   ESOPHAGOGASTRODUODENOSCOPY N/A 04/19/2019   Procedure: ESOPHAGOGASTRODUODENOSCOPY (EGD);  Surgeon: Wyline Mood, MD;  Location: [facility] ENDOSCOPY;  Service:  Gastroenterology;  Laterality: N/A;   ESOPHAGOGASTRODUODENOSCOPY (EGD) WITH PROPOFOL N/A 07/03/2019   Procedure: ESOPHAGOGASTRODUODENOSCOPY (EGD) WITH PROPOFOL;  Surgeon: Wyline Mood, MD;  Location: [facility] ENDOSCOPY;  Service: Gastroenterology;  Laterality: N/A;   ESOPHAGOGASTRODUODENOSCOPY (EGD) WITH PROPOFOL N/A 08/07/2019   Procedure: ESOPHAGOGASTRODUODENOSCOPY (EGD) WITH PROPOFOL;  Surgeon: Rachael Fee, MD;  Location: [facility] ENDOSCOPY;  Service: Endoscopy;  Laterality: N/A;   EUS N/A 08/07/2019   Procedure: UPPER ENDOSCOPIC ULTRASOUND (EUS) RADIAL;  Surgeon: Rachael Fee, MD;  Location: [facility] ENDOSCOPY;  Service: Endoscopy;  Laterality: N/A;   eyelid surgery     FOOT SURGERY Right 2009   HEMORROIDECTOMY     HERNIA REPAIR     mrercp  2014   NOSE SURGERY  2011   REVERSE SHOULDER ARTHROPLASTY Left 02/09/2022   Procedure: REVERSE SHOULDER ARTHROPLASTY AND BICEPS TENODESIS.;  Surgeon: Christena Flake, MD;  Location: [facility] ORS;  Service: Orthopedics;  Laterality: Left;   RHINOPLASTY     squamous cell carcinoma removed from right hand  2013   TONSILLECTOMY     WISDOM TOOTH EXTRACTION      Allergies  Allergen Reactions   Nsaids     Can take Ibuprofen in moderation/high doses or prolonged use causes GI bleed    Outpatient Encounter Medications as of 04/10/2023  Medication Sig   acetaminophen (TYLENOL) 650 MG CR tablet Take 650 mg by mouth every 8 (eight) hours as needed for pain.   albuterol (PROVENTIL) (2.5 MG/3ML) 0.083% nebulizer solution One application inhale every 24 hours as needed.   cetirizine (ZYRTEC) 10 MG tablet Take 10 mg by mouth every morning.   clindamycin (CLEOCIN T) 1 % external solution Apply 1 % topically as needed. (Apply to head)   desvenlafaxine (PRISTIQ) 100 MG 24 hr tablet Take 50 mg by mouth daily.   diltiazem (CARDIZEM CD) 120 MG 24 hr capsule Take 120 mg by mouth daily.   feeding supplement (ENSURE ENLIVE / ENSURE PLUS) LIQD Take 237 mLs by mouth 2 (two) times  daily between meals.   fluticasone (FLONASE) 50 MCG/ACT nasal spray Place 1 spray into both nostrils daily as needed for allergies or rhinitis.   folic acid (FOLVITE) 1 MG tablet Take 1 tablet (1 mg total) by mouth daily.   guaiFENesin (MUCINEX) 600 MG 12 hr tablet Take 600 mg by mouth 2 (two) times daily.   lidocaine (LIDODERM) 5 % Place 1 patch onto the skin daily. Remove & Discard patch within 12 hours or as directed by MD   Multiple Vitamin (MULTIVITAMIN WITH MINERALS) TABS tablet Take 1 tablet by mouth daily.   naltrexone (DEPADE) 50 MG tablet Take 25 mg by mouth daily.   nystatin cream (MYCOSTATIN) Apply topically 2 (two) times daily.   pantoprazole (PROTONIX) 40 MG tablet Take 1 tablet (40 mg total) by mouth daily.   polyethylene glycol (MIRALAX / GLYCOLAX) 17 g packet Take 17  g by mouth daily.   sodium chloride (OCEAN) 0.65 % nasal spray Place 1 spray into the nose every 6 (six) hours as needed for congestion.   timolol (TIMOPTIC) 0.5 % ophthalmic solution Place 1 drop into both eyes daily.    [DISCONTINUED] sodium chloride (OCEAN) 0.65 % SOLN nasal spray Place 1 spray into both nostrils as needed for congestion. (Patient taking differently: Place 1 spray into both nostrils every 6 (six) hours as needed for congestion.)   No facility-administered encounter medications on file as of 04/10/2023.    Review of Systems  Constitutional:  Negative for activity change, appetite change, fatigue and unexpected weight change.  HENT:  Positive for congestion. Negative for hearing loss.   Eyes: Negative.   Respiratory:  Positive for cough. Negative for shortness of breath.   Cardiovascular:  Negative for chest pain, palpitations and leg swelling.  Gastrointestinal:  Negative for abdominal pain, constipation and diarrhea.  Genitourinary:  Negative for difficulty urinating and dysuria.  Musculoskeletal:  Negative for arthralgias and myalgias.  Skin:  Negative for color change and wound.   Neurological:  Negative for dizziness and weakness.  Psychiatric/Behavioral:  Negative for agitation, behavioral problems and confusion.      Immunization History  Administered Date(s) Administered   Fluad Quad(high Dose 65+) 11/21/2018   Influenza Split 01/24/2013, 12/15/2014   Influenza, Mdck, Trivalent,PF 6+ MOS(egg free) 01/04/2023   Influenza, Seasonal, Injecte, Preservative Fre 12/06/2011   Influenza,inj,Quad PF,6+ Mos 11/19/2017, 12/09/2019   Influenza-Unspecified 12/26/2010, 01/24/2013, 12/18/2013, 11/12/2014, 11/12/2015, 12/09/2015, 12/08/2016, 11/21/2018   PFIZER Comirnaty(Gray Top)Covid-19 Tri-Sucrose Vaccine 03/18/2019, 04/08/2019, 12/15/2019   PFIZER(Purple Top)SARS-COV-2 Vaccination 03/18/2019, 04/08/2019, 12/15/2019, 01/15/2020, 06/22/2020   PPD Test 05/28/2020   Pneumococcal Conjugate-13 09/29/2015   Pneumococcal Polysaccharide-23 03/14/2011, 03/28/2011   Tdap 07/14/2011   Unspecified SARS-COV-2 Vaccination 11/16/2020   Zoster Recombinant(Shingrix) 08/11/2017, 11/19/2017, 10/25/2020   Zoster, Live 12/12/2011   Pertinent  Health Maintenance Due  Topic Date Due   INFLUENZA VACCINE  Completed   Colonoscopy  Discontinued      02/13/2022   10:00 AM 02/13/2022    8:10 PM 02/14/2022   12:00 PM 02/27/2022    1:21 PM 03/02/2022   11:31 AM  Fall Risk  (RETIRED) Patient Fall Risk Level High fall risk High fall risk High fall risk High fall risk High fall risk   Functional Status Survey:    Vitals:   04/10/23 0825  BP: 122/74  Pulse: 85  Resp: 17  Temp: (!) 97.3 F (36.3 C)  SpO2: 92%  Weight: 194 lb 6.4 oz (88.2 kg)  Height: 5\' 6"  (1.676 m)   Body mass index is 31.38 kg/m. Physical Exam Constitutional:      General: He is not in acute distress.    Appearance: He is well-developed. He is not diaphoretic.  HENT:     Head: Normocephalic and atraumatic.     Right Ear: External ear normal.     Left Ear: External ear normal.     Mouth/Throat:     Pharynx:  No oropharyngeal exudate.  Eyes:     Conjunctiva/sclera: Conjunctivae normal.     Pupils: Pupils are equal, round, and reactive to light.  Cardiovascular:     Rate and Rhythm: Normal rate and regular rhythm.     Heart sounds: Normal heart sounds.  Pulmonary:     Effort: Pulmonary effort is normal.     Breath sounds: Normal breath sounds.  Abdominal:     General: Bowel sounds are normal.  Palpations: Abdomen is soft.  Musculoskeletal:        General: No tenderness.     Cervical back: Normal range of motion and neck supple.     Right lower leg: No edema.     Left lower leg: No edema.  Skin:    General: Skin is warm and dry.  Neurological:     Mental Status: He is alert and oriented to person, place, and time.     Labs reviewed: Recent Labs    07/16/22 0403 07/17/22 0438 07/19/22 0548 07/21/22 0353 03/10/23 1132 03/11/23 0549 03/15/23 0422 04/05/23 0000  NA 140   < > 138   < > 141 136 136 137  K 3.9   < > 3.5   < > 4.1 4.1 4.4 4.6  CL 101   < > 95*   < > 103 99 96* 99  CO2 32   < > 35*   < > 26 30 30  32*  GLUCOSE 98   < > 113*   < > 92 92 103*  --   BUN 24*   < > 15   < > 21 23 21  22*  CREATININE 0.82   < > 0.71   < > 0.64 0.74 0.60* 0.6  CALCIUM 8.6*   < > 8.6*   < > 9.0 8.5* 8.6* 9.2  MG 2.1  --  1.8  --   --   --   --   --    < > = values in this interval not displayed.   Recent Labs    07/15/22 0024 03/10/23 1132  AST 19 27  ALT 11 30  ALKPHOS 69 98  BILITOT 0.7 1.0  PROT 6.5 7.0  ALBUMIN 3.5 3.9   Recent Labs    01/10/23 1415 03/10/23 1132 03/11/23 0549 03/15/23 0422 04/05/23 0000  WBC 5.9 8.7 6.0 6.2 6.2  NEUTROABS 3.8  --   --  4.2 3,999.00  HGB 12.0* 14.4 12.0* 11.5* 12.3*  HCT 36.3* 43.5 35.9* 34.0* 37*  MCV 99.7 98.0 97.8 96.9  --   PLT 144* 237 174 182 155   Lab Results  Component Value Date   TSH 3.338 02/11/2022   No results found for: "HGBA1C" No results found for: "CHOL", "HDL", "LDLCALC", "LDLDIRECT", "TRIG",  "CHOLHDL"  Significant Diagnostic Results in last 30 days:  No results found.  Assessment/Plan 1. Chronic bilateral low back pain without sciatica (Primary) -pain is stable, does not need lidocaine patch Continues tylenol PRN  2. Persistent atrial fibrillation (HCC) Rate controlled, no longer on anticoagulation due to AA, multiple falls and hx of GI bleed   3. Slow transit constipation Controlled on current regimen  4. Primary hypertension -Blood pressure well controlled, goal bp <140/90 Continue current medications and dietary modifications follow metabolic panel  5. Alcohol use disorder, moderate, dependence (HCC) Discussed cessation, he states he is not craving ETOH and able to sustain Declines naltrexone and does not wish to take any medication for depression Will dc at naltrexone and prestiq at this time.   6. Upper respiratory tract infection, unspecified type Has been having cough, congestion in chest and nose with post nasal drip for several days Overall feels well with no shortness of breath or chest pains Cough makes him uncomfortable and feels like he is wheezing with cough.  Add mucinex DM by mouth BID for 7 days with full glass of water -continue albuterol PRN -nasal saline q1 hour pRN  Stable to dc  home with home health, has follow up appt with PCP same day as discharge.   Janene Harvey. Biagio [facility] [phone]

## 2023-04-12 DIAGNOSIS — D509 Iron deficiency anemia, unspecified: Secondary | ICD-10-CM | POA: Diagnosis not present

## 2023-04-12 DIAGNOSIS — R053 Chronic cough: Secondary | ICD-10-CM | POA: Diagnosis not present

## 2023-04-12 DIAGNOSIS — Z0000 Encounter for general adult medical examination without abnormal findings: Secondary | ICD-10-CM | POA: Diagnosis not present

## 2023-04-12 DIAGNOSIS — E782 Mixed hyperlipidemia: Secondary | ICD-10-CM | POA: Diagnosis not present

## 2023-04-12 DIAGNOSIS — Z6831 Body mass index (BMI) 31.0-31.9, adult: Secondary | ICD-10-CM | POA: Diagnosis not present

## 2023-04-12 DIAGNOSIS — I4819 Other persistent atrial fibrillation: Secondary | ICD-10-CM | POA: Diagnosis not present

## 2023-04-12 DIAGNOSIS — R270 Ataxia, unspecified: Secondary | ICD-10-CM | POA: Diagnosis not present

## 2023-04-12 DIAGNOSIS — R030 Elevated blood-pressure reading, without diagnosis of hypertension: Secondary | ICD-10-CM | POA: Diagnosis not present

## 2023-04-12 DIAGNOSIS — E6609 Other obesity due to excess calories: Secondary | ICD-10-CM | POA: Diagnosis not present

## 2023-04-12 DIAGNOSIS — E66811 Obesity, class 1: Secondary | ICD-10-CM | POA: Diagnosis not present

## 2023-04-18 DIAGNOSIS — N401 Enlarged prostate with lower urinary tract symptoms: Secondary | ICD-10-CM | POA: Diagnosis not present

## 2023-04-18 DIAGNOSIS — I4819 Other persistent atrial fibrillation: Secondary | ICD-10-CM | POA: Diagnosis not present

## 2023-04-18 DIAGNOSIS — N200 Calculus of kidney: Secondary | ICD-10-CM | POA: Diagnosis not present

## 2023-04-18 DIAGNOSIS — I340 Nonrheumatic mitral (valve) insufficiency: Secondary | ICD-10-CM | POA: Diagnosis not present

## 2023-04-18 DIAGNOSIS — D509 Iron deficiency anemia, unspecified: Secondary | ICD-10-CM | POA: Diagnosis not present

## 2023-04-18 DIAGNOSIS — I2510 Atherosclerotic heart disease of native coronary artery without angina pectoris: Secondary | ICD-10-CM | POA: Diagnosis not present

## 2023-04-18 DIAGNOSIS — E782 Mixed hyperlipidemia: Secondary | ICD-10-CM | POA: Diagnosis not present

## 2023-04-18 DIAGNOSIS — K5901 Slow transit constipation: Secondary | ICD-10-CM | POA: Diagnosis not present

## 2023-04-18 DIAGNOSIS — K219 Gastro-esophageal reflux disease without esophagitis: Secondary | ICD-10-CM | POA: Diagnosis not present

## 2023-04-18 DIAGNOSIS — F10229 Alcohol dependence with intoxication, unspecified: Secondary | ICD-10-CM | POA: Diagnosis not present

## 2023-04-18 DIAGNOSIS — S22088D Other fracture of T11-T12 vertebra, subsequent encounter for fracture with routine healing: Secondary | ICD-10-CM | POA: Diagnosis not present

## 2023-04-18 DIAGNOSIS — E66811 Obesity, class 1: Secondary | ICD-10-CM | POA: Diagnosis not present

## 2023-04-18 DIAGNOSIS — M5134 Other intervertebral disc degeneration, thoracic region: Secondary | ICD-10-CM | POA: Diagnosis not present

## 2023-04-18 DIAGNOSIS — K449 Diaphragmatic hernia without obstruction or gangrene: Secondary | ICD-10-CM | POA: Diagnosis not present

## 2023-04-18 DIAGNOSIS — M4802 Spinal stenosis, cervical region: Secondary | ICD-10-CM | POA: Diagnosis not present

## 2023-04-18 DIAGNOSIS — I119 Hypertensive heart disease without heart failure: Secondary | ICD-10-CM | POA: Diagnosis not present

## 2023-04-18 DIAGNOSIS — M47812 Spondylosis without myelopathy or radiculopathy, cervical region: Secondary | ICD-10-CM | POA: Diagnosis not present

## 2023-04-18 DIAGNOSIS — S2020XD Contusion of thorax, unspecified, subsequent encounter: Secondary | ICD-10-CM | POA: Diagnosis not present

## 2023-04-18 DIAGNOSIS — F32A Depression, unspecified: Secondary | ICD-10-CM | POA: Diagnosis not present

## 2023-04-18 DIAGNOSIS — M5116 Intervertebral disc disorders with radiculopathy, lumbar region: Secondary | ICD-10-CM | POA: Diagnosis not present

## 2023-04-18 DIAGNOSIS — M8588 Other specified disorders of bone density and structure, other site: Secondary | ICD-10-CM | POA: Diagnosis not present

## 2023-04-18 DIAGNOSIS — R270 Ataxia, unspecified: Secondary | ICD-10-CM | POA: Diagnosis not present

## 2023-04-18 DIAGNOSIS — H409 Unspecified glaucoma: Secondary | ICD-10-CM | POA: Diagnosis not present

## 2023-04-18 DIAGNOSIS — I700 Atherosclerosis of aorta: Secondary | ICD-10-CM | POA: Diagnosis not present

## 2023-04-25 DIAGNOSIS — Z79899 Other long term (current) drug therapy: Secondary | ICD-10-CM | POA: Diagnosis not present

## 2023-04-25 DIAGNOSIS — D509 Iron deficiency anemia, unspecified: Secondary | ICD-10-CM | POA: Diagnosis not present

## 2023-04-25 DIAGNOSIS — R030 Elevated blood-pressure reading, without diagnosis of hypertension: Secondary | ICD-10-CM | POA: Diagnosis not present

## 2023-04-25 DIAGNOSIS — E7800 Pure hypercholesterolemia, unspecified: Secondary | ICD-10-CM | POA: Diagnosis not present

## 2023-04-25 DIAGNOSIS — I4819 Other persistent atrial fibrillation: Secondary | ICD-10-CM | POA: Diagnosis not present

## 2023-05-07 DIAGNOSIS — M5134 Other intervertebral disc degeneration, thoracic region: Secondary | ICD-10-CM | POA: Diagnosis not present

## 2023-05-07 DIAGNOSIS — I4819 Other persistent atrial fibrillation: Secondary | ICD-10-CM | POA: Diagnosis not present

## 2023-05-07 DIAGNOSIS — M5116 Intervertebral disc disorders with radiculopathy, lumbar region: Secondary | ICD-10-CM | POA: Diagnosis not present

## 2023-05-07 DIAGNOSIS — F10229 Alcohol dependence with intoxication, unspecified: Secondary | ICD-10-CM | POA: Diagnosis not present

## 2023-05-07 DIAGNOSIS — S2020XD Contusion of thorax, unspecified, subsequent encounter: Secondary | ICD-10-CM | POA: Diagnosis not present

## 2023-05-07 DIAGNOSIS — I700 Atherosclerosis of aorta: Secondary | ICD-10-CM | POA: Diagnosis not present

## 2023-05-07 DIAGNOSIS — M4802 Spinal stenosis, cervical region: Secondary | ICD-10-CM | POA: Diagnosis not present

## 2023-05-14 ENCOUNTER — Inpatient Hospital Stay: Payer: Non-veteran care | Attending: Oncology

## 2023-05-14 DIAGNOSIS — R600 Localized edema: Secondary | ICD-10-CM | POA: Diagnosis not present

## 2023-05-14 DIAGNOSIS — D509 Iron deficiency anemia, unspecified: Secondary | ICD-10-CM | POA: Insufficient documentation

## 2023-05-14 DIAGNOSIS — E538 Deficiency of other specified B group vitamins: Secondary | ICD-10-CM

## 2023-05-14 DIAGNOSIS — E785 Hyperlipidemia, unspecified: Secondary | ICD-10-CM | POA: Diagnosis not present

## 2023-05-14 DIAGNOSIS — I4891 Unspecified atrial fibrillation: Secondary | ICD-10-CM | POA: Insufficient documentation

## 2023-05-14 DIAGNOSIS — I10 Essential (primary) hypertension: Secondary | ICD-10-CM | POA: Insufficient documentation

## 2023-05-14 DIAGNOSIS — Z79899 Other long term (current) drug therapy: Secondary | ICD-10-CM | POA: Insufficient documentation

## 2023-05-14 DIAGNOSIS — Z7901 Long term (current) use of anticoagulants: Secondary | ICD-10-CM | POA: Diagnosis not present

## 2023-05-14 DIAGNOSIS — I2510 Atherosclerotic heart disease of native coronary artery without angina pectoris: Secondary | ICD-10-CM | POA: Diagnosis not present

## 2023-05-14 DIAGNOSIS — Z87891 Personal history of nicotine dependence: Secondary | ICD-10-CM | POA: Diagnosis not present

## 2023-05-14 DIAGNOSIS — Z86718 Personal history of other venous thrombosis and embolism: Secondary | ICD-10-CM | POA: Diagnosis not present

## 2023-05-14 LAB — CBC WITH DIFFERENTIAL (CANCER CENTER ONLY)
Abs Immature Granulocytes: 0.06 10*3/uL (ref 0.00–0.07)
Basophils Absolute: 0 10*3/uL (ref 0.0–0.1)
Basophils Relative: 0 %
Eosinophils Absolute: 0.1 10*3/uL (ref 0.0–0.5)
Eosinophils Relative: 1 %
HCT: 42.1 % (ref 39.0–52.0)
Hemoglobin: 13.9 g/dL (ref 13.0–17.0)
Immature Granulocytes: 1 %
Lymphocytes Relative: 16 %
Lymphs Abs: 1.4 10*3/uL (ref 0.7–4.0)
MCH: 32 pg (ref 26.0–34.0)
MCHC: 33 g/dL (ref 30.0–36.0)
MCV: 96.8 fL (ref 80.0–100.0)
Monocytes Absolute: 1 10*3/uL (ref 0.1–1.0)
Monocytes Relative: 11 %
Neutro Abs: 6.6 10*3/uL (ref 1.7–7.7)
Neutrophils Relative %: 71 %
Platelet Count: 254 10*3/uL (ref 150–400)
RBC: 4.35 MIL/uL (ref 4.22–5.81)
RDW: 13.3 % (ref 11.5–15.5)
WBC Count: 9.3 10*3/uL (ref 4.0–10.5)
nRBC: 0 % (ref 0.0–0.2)

## 2023-05-14 LAB — IRON AND TIBC
Iron: 43 ug/dL — ABNORMAL LOW (ref 45–182)
Saturation Ratios: 14 % — ABNORMAL LOW (ref 17.9–39.5)
TIBC: 305 ug/dL (ref 250–450)
UIBC: 262 ug/dL

## 2023-05-14 LAB — FERRITIN: Ferritin: 399 ng/mL — ABNORMAL HIGH (ref 24–336)

## 2023-05-15 ENCOUNTER — Encounter: Payer: Self-pay | Admitting: Oncology

## 2023-05-15 ENCOUNTER — Inpatient Hospital Stay (HOSPITAL_BASED_OUTPATIENT_CLINIC_OR_DEPARTMENT_OTHER): Payer: Non-veteran care | Admitting: Oncology

## 2023-05-15 ENCOUNTER — Inpatient Hospital Stay: Payer: Non-veteran care

## 2023-05-15 VITALS — BP 116/72 | HR 74 | Temp 98.1°F | Resp 16 | Ht 66.0 in | Wt 184.0 lb

## 2023-05-15 DIAGNOSIS — D509 Iron deficiency anemia, unspecified: Secondary | ICD-10-CM

## 2023-05-15 DIAGNOSIS — Z79899 Other long term (current) drug therapy: Secondary | ICD-10-CM | POA: Diagnosis not present

## 2023-05-15 DIAGNOSIS — R600 Localized edema: Secondary | ICD-10-CM

## 2023-05-15 MED ORDER — SODIUM CHLORIDE 0.9 % IV SOLN
510.0000 mg | Freq: Once | INTRAVENOUS | Status: AC
  Administered 2023-05-15: 510 mg via INTRAVENOUS
  Filled 2023-05-15: qty 510

## 2023-05-15 MED ORDER — SODIUM CHLORIDE 0.9 % IV SOLN
Freq: Once | INTRAVENOUS | Status: AC
Start: 2023-05-15 — End: 2023-05-15
  Filled 2023-05-15: qty 250

## 2023-05-15 NOTE — Progress Notes (Signed)
 [facility]  Telephone:(336) [phone] Fax:(336) [phone]  ID: Marcus Coleman OB: 08/11/1940  MR#: [id number]  VOJ#:[id number]  Patient Care Team: [name], MD as PCP - General (Internal Medicine) Jeralyn Ruths, MD as Consulting Physician (Hematology and Oncology)  CHIEF COMPLAINT: Iron deficiency anemia.  INTERVAL HISTORY: Patient returns to clinic today for repeat laboratory work, further evaluation, and consideration of additional IV Feraheme.  He continues to have mild fatigue, but otherwise feels well.  He has no neurologic complaints.  He denies any recent fevers or illnesses.  He has a good appetite and denies weight loss.  He denies any chest pain, shortness of breath, cough, or hemoptysis.  He denies any nausea, vomiting, constipation, or diarrhea.  He has no melena or hematochezia.  He has no urinary complaints.  Patient offers no further specific complaints today.  REVIEW OF SYSTEMS:   Review of Systems  Constitutional:  Positive for malaise/fatigue. Negative for fever and weight loss.  Respiratory: Negative.  Negative for cough, hemoptysis and shortness of breath.   Cardiovascular: Negative.  Negative for chest pain and leg swelling.  Gastrointestinal: Negative.  Negative for abdominal pain, blood in stool and melena.  Genitourinary: Negative.  Negative for hematuria.  Musculoskeletal: Negative.  Negative for back pain.  Skin: Negative.  Negative for rash.  Neurological: Negative.  Negative for dizziness, focal weakness, weakness and headaches.  Psychiatric/Behavioral: Negative.  The patient is not nervous/anxious.     As per HPI. Otherwise, a complete review of systems is negative.  PAST MEDICAL HISTORY: Past Medical History:  Diagnosis Date   Aortic atherosclerosis (HCC)    Atrial fibrillation (HCC)    a.) CHA2DS2VASc = 6 (age x2, HTN, DVT x2, vascular disease history);  b.) rate/rhythm maintained on oral diltiazem + metoprolol succinate;  chronic anticoagulation (apixaban) discontinued due to GI bleeding   BPH with obstruction/lower urinary tract symptoms    Bronchitis 01/27/2022   CAD (coronary artery disease)    Cardiomegaly    Chronic diarrhea    Compression fracture of thoracic vertebra (HCC)    a.) CT renal 12/30/2021: chronic appearing T11-T12 (70% height loss)   DDD (degenerative disc disease), cervical    Diverticulosis    DVT (deep venous thrombosis) (HCC) 03/31/2019   a.) anticoagulation intiated (apixaban), however subsequently discontinued due to GI bleeding   ETOH abuse    GI bleed    Heart murmur    Hiatal hernia    Hyperlipidemia    Hypertension    IDA (iron deficiency anemia)    Low testosterone    Nephrolithiasis    Pneumonia    Rotator cuff arthropathy, left    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    Vasovagal syncope     PAST SURGICAL HISTORY: Past Surgical History:  Procedure Laterality Date   boils removed from right forearm  1968   CARDIAC CATHETERIZATION     CATARACT EXTRACTION Left 2008   catracts Bilateral    left 2008, right 2013   CHOLECYSTECTOMY     COLONOSCOPY WITH PROPOFOL N/A 06/26/2016   Procedure: COLONOSCOPY WITH PROPOFOL;  Surgeon: Scot Jun, MD;  Location: [facility] ENDOSCOPY;  Service: Endoscopy;  Laterality: N/A;   COLONOSCOPY WITH PROPOFOL N/A 07/03/2019   Procedure: COLONOSCOPY WITH PROPOFOL;  Surgeon: Wyline Mood, MD;  Location: [facility] ENDOSCOPY;  Service: Gastroenterology;  Laterality: N/A;   ENDOSCOPIC RETROGRADE CHOLANGIOPANCREATOGRAPHY (ERCP) WITH PROPOFOL     ENDOSCOPIC VEIN LASER TREATMENT     right  leg 05/2015, left leg 09/2015   ESOPHAGOGASTRODUODENOSCOPY N/A 04/19/2019   Procedure: ESOPHAGOGASTRODUODENOSCOPY (EGD);  Surgeon: Wyline Mood, MD;  Location: [facility] ENDOSCOPY;  Service: Gastroenterology;  Laterality: N/A;   ESOPHAGOGASTRODUODENOSCOPY (EGD) WITH PROPOFOL N/A 07/03/2019   Procedure: ESOPHAGOGASTRODUODENOSCOPY (EGD) WITH PROPOFOL;  Surgeon: Wyline Mood,  MD;  Location: [facility] ENDOSCOPY;  Service: Gastroenterology;  Laterality: N/A;   ESOPHAGOGASTRODUODENOSCOPY (EGD) WITH PROPOFOL N/A 08/07/2019   Procedure: ESOPHAGOGASTRODUODENOSCOPY (EGD) WITH PROPOFOL;  Surgeon: Rachael Fee, MD;  Location: [facility] ENDOSCOPY;  Service: Endoscopy;  Laterality: N/A;   EUS N/A 08/07/2019   Procedure: UPPER ENDOSCOPIC ULTRASOUND (EUS) RADIAL;  Surgeon: Rachael Fee, MD;  Location: [facility] ENDOSCOPY;  Service: Endoscopy;  Laterality: N/A;   eyelid surgery     FOOT SURGERY Right 2009   HEMORROIDECTOMY     HERNIA REPAIR     mrercp  2014   NOSE SURGERY  2011   REVERSE SHOULDER ARTHROPLASTY Left 02/09/2022   Procedure: REVERSE SHOULDER ARTHROPLASTY AND BICEPS TENODESIS.;  Surgeon: Christena Flake, MD;  Location: [facility] ORS;  Service: Orthopedics;  Laterality: Left;   RHINOPLASTY     squamous cell carcinoma removed from right hand  2013   TONSILLECTOMY     WISDOM TOOTH EXTRACTION      FAMILY HISTORY: Family History  Problem Relation Age of Onset   Hypothyroidism Mother    Diabetes Mother    Lung cancer Mother     ADVANCED DIRECTIVES (Y/N):  N  HEALTH MAINTENANCE: Social History   Tobacco Use   Smoking status: Former    Current packs/day: 0.00    Average packs/day: 1 pack/day for 25.0 years (25.0 ttl pk-yrs)    Types: Cigarettes    Start date: 12/04/1961    Quit date: 12/05/1986    Years since quitting: 36.4   Smokeless tobacco: Never  Vaping Use   Vaping status: Never Used  Substance Use Topics   Alcohol use: Yes    Alcohol/week: 14.0 standard drinks of alcohol    Types: 14 Cans of beer per week    Comment: 2 beers per dayOR  a couple glasses of wine   Drug use: No     Colonoscopy:  PAP:  Bone density:  Lipid panel:  Allergies  Allergen Reactions   Nsaids     Can take Ibuprofen in moderation/high doses or prolonged use causes GI bleed    Current Outpatient Medications  Medication Sig Dispense Refill   acetaminophen (TYLENOL) 650 MG CR  tablet Take 650 mg by mouth every 8 (eight) hours as needed for pain.     albuterol (PROVENTIL) (2.5 MG/3ML) 0.083% nebulizer solution One application inhale every 24 hours as needed.     cetirizine (ZYRTEC) 10 MG tablet Take 10 mg by mouth every morning.     clindamycin (CLEOCIN T) 1 % external solution Apply 1 % topically as needed. (Apply to head)     desvenlafaxine (PRISTIQ) 100 MG 24 hr tablet Take 50 mg by mouth daily.     diltiazem (CARDIZEM CD) 120 MG 24 hr capsule Take 120 mg by mouth daily.     diltiazem (CARDIZEM) 30 MG tablet TAKE 1 TABLET BY MOUTH EVERY 12 HOURS 60 tablet 0   feeding supplement (ENSURE ENLIVE / ENSURE PLUS) LIQD Take 237 mLs by mouth 2 (two) times daily between meals. 14220 mL 0   fluticasone (FLONASE) 50 MCG/ACT nasal spray Use 1 spray(s) in each nostril once daily 16 g 0   fluticasone (FLONASE) 50 MCG/ACT nasal  spray Place 1 spray into both nostrils daily as needed for allergies or rhinitis.  2   folic acid (FOLVITE) 1 MG tablet Take 1 tablet (1 mg total) by mouth daily.     guaiFENesin (MUCINEX) 600 MG 12 hr tablet Take 600 mg by mouth 2 (two) times daily.     lidocaine (LIDODERM) 5 % Place 1 patch onto the skin daily. Remove & Discard patch within 12 hours or as directed by MD 30 patch 0   Multiple Vitamin (MULTIVITAMIN WITH MINERALS) TABS tablet Take 1 tablet by mouth daily.     naltrexone (DEPADE) 50 MG tablet Take 25 mg by mouth daily.     nystatin cream (MYCOSTATIN) Apply topically 2 (two) times daily. 30 g 0   pantoprazole (PROTONIX) 40 MG tablet Take 1 tablet (40 mg total) by mouth daily. 30 tablet 0   polyethylene glycol (MIRALAX / GLYCOLAX) 17 g packet Take 17 g by mouth daily.     sodium chloride (OCEAN) 0.65 % nasal spray Place 1 spray into the nose every 6 (six) hours as needed for congestion.     tamsulosin (FLOMAX) 0.4 MG CAPS capsule Take 1 capsule by mouth once daily 30 capsule 0   timolol (TIMOPTIC) 0.5 % ophthalmic solution Place 1 drop into both  eyes daily.      No current facility-administered medications for this visit.    OBJECTIVE: Vitals:   05/15/23 1431  BP: 116/72  Pulse: 74  Resp: 16  Temp: 98.1 F (36.7 C)  SpO2: 96%      Body mass index is 29.7 kg/m.    ECOG FS:1 - Symptomatic but completely ambulatory  General: Well-developed, well-nourished, no acute distress. Eyes: Pink conjunctiva, anicteric sclera. HEENT: Normocephalic, moist mucous membranes. Lungs: No audible wheezing or coughing. Heart: Regular rate and rhythm. Abdomen: Soft, nontender, no obvious distention. Musculoskeletal: No edema, cyanosis, or clubbing. Neuro: Alert, answering all questions appropriately. Cranial nerves grossly intact. Skin: No rashes or petechiae noted. Psych: Normal affect.  LAB RESULTS:  Lab Results  Component Value Date   NA 137 04/05/2023   K 4.6 04/05/2023   CL 99 04/05/2023   CO2 32 (A) 04/05/2023   GLUCOSE 103 (H) 03/15/2023   BUN 22 (A) 04/05/2023   CREATININE 0.6 04/05/2023   CALCIUM 9.2 04/05/2023   PROT 7.0 03/10/2023   ALBUMIN 3.9 03/10/2023   AST 27 03/10/2023   ALT 30 03/10/2023   ALKPHOS 98 03/10/2023   BILITOT 1.0 03/10/2023   GFRNONAA >60 03/15/2023   GFRAA >60 04/21/2019    Lab Results  Component Value Date   WBC 9.3 05/14/2023   NEUTROABS 6.6 05/14/2023   HGB 13.9 05/14/2023   HCT 42.1 05/14/2023   MCV 96.8 05/14/2023   PLT 254 05/14/2023   Lab Results  Component Value Date   IRON 43 (L) 05/14/2023   TIBC 305 05/14/2023   IRONPCTSAT 14 (L) 05/14/2023   Lab Results  Component Value Date   FERRITIN 399 (H) 05/14/2023     STUDIES: No results found.  ASSESSMENT: Iron deficiency anemia  Iron deficiency anemia: Patient's hemoglobin has improved to 13.9, but his total iron and iron saturation remain decreased.  Ferritin is likely falsely elevated as an acute phase reactant.  Previously, patient had a normal colonoscopy on June 26, 2016.  His most recent EGD on Aug 07, 2019 did  not reveal any significant pathology.  Proceed with Feraheme today.  Return to clinic in 1 week for a second  infusion of IV Feraheme.  Patient will then return to clinic in 4 months with repeat laboratory, further evaluation, and continuation of treatment if needed. B12 deficiency: Resolved.  Patient last received treatment on Jul 26, 2021. Peripheral edema: Chronic and unchanged.  I spent a total of 30 minutes reviewing chart data, face-to-face evaluation with the patient, counseling and coordination of care as detailed above.    Patient expressed understanding and was in agreement with this plan. He also understands that He can call clinic at any time with any questions, concerns, or complaints.    Jeralyn Ruths, MD   05/15/2023 2:50 PM

## 2023-05-22 DIAGNOSIS — I4819 Other persistent atrial fibrillation: Secondary | ICD-10-CM | POA: Diagnosis not present

## 2023-05-22 DIAGNOSIS — R030 Elevated blood-pressure reading, without diagnosis of hypertension: Secondary | ICD-10-CM | POA: Diagnosis not present

## 2023-05-22 DIAGNOSIS — Z86718 Personal history of other venous thrombosis and embolism: Secondary | ICD-10-CM | POA: Diagnosis not present

## 2023-05-22 DIAGNOSIS — R55 Syncope and collapse: Secondary | ICD-10-CM | POA: Diagnosis not present

## 2023-05-22 DIAGNOSIS — E7800 Pure hypercholesterolemia, unspecified: Secondary | ICD-10-CM | POA: Diagnosis not present

## 2023-05-23 ENCOUNTER — Inpatient Hospital Stay

## 2023-05-23 VITALS — BP 120/66 | HR 82 | Temp 97.6°F | Resp 16

## 2023-05-23 DIAGNOSIS — D509 Iron deficiency anemia, unspecified: Secondary | ICD-10-CM | POA: Diagnosis not present

## 2023-05-23 MED ORDER — SODIUM CHLORIDE 0.9 % IV SOLN
Freq: Once | INTRAVENOUS | Status: AC
  Filled 2023-05-23: qty 250

## 2023-05-23 MED ORDER — SODIUM CHLORIDE 0.9 % IV SOLN
510.0000 mg | Freq: Once | INTRAVENOUS | Status: AC
  Administered 2023-05-23: 510 mg via INTRAVENOUS
  Filled 2023-05-23: qty 510

## 2023-07-05 DIAGNOSIS — D0471 Carcinoma in situ of skin of right lower limb, including hip: Secondary | ICD-10-CM | POA: Diagnosis not present

## 2023-07-06 DIAGNOSIS — Z79899 Other long term (current) drug therapy: Secondary | ICD-10-CM | POA: Diagnosis not present

## 2023-07-06 DIAGNOSIS — D509 Iron deficiency anemia, unspecified: Secondary | ICD-10-CM | POA: Diagnosis not present

## 2023-07-06 DIAGNOSIS — R030 Elevated blood-pressure reading, without diagnosis of hypertension: Secondary | ICD-10-CM | POA: Diagnosis not present

## 2023-07-06 DIAGNOSIS — Z125 Encounter for screening for malignant neoplasm of prostate: Secondary | ICD-10-CM | POA: Diagnosis not present

## 2023-07-06 DIAGNOSIS — E782 Mixed hyperlipidemia: Secondary | ICD-10-CM | POA: Diagnosis not present

## 2023-07-06 DIAGNOSIS — I4819 Other persistent atrial fibrillation: Secondary | ICD-10-CM | POA: Diagnosis not present

## 2023-07-06 DIAGNOSIS — R7309 Other abnormal glucose: Secondary | ICD-10-CM | POA: Diagnosis not present

## 2023-08-10 DIAGNOSIS — F1020 Alcohol dependence, uncomplicated: Secondary | ICD-10-CM | POA: Diagnosis not present

## 2023-08-10 DIAGNOSIS — M5030 Other cervical disc degeneration, unspecified cervical region: Secondary | ICD-10-CM | POA: Diagnosis not present

## 2023-08-10 DIAGNOSIS — R413 Other amnesia: Secondary | ICD-10-CM | POA: Diagnosis not present

## 2023-08-10 DIAGNOSIS — R32 Unspecified urinary incontinence: Secondary | ICD-10-CM | POA: Diagnosis not present

## 2023-08-10 DIAGNOSIS — Z1331 Encounter for screening for depression: Secondary | ICD-10-CM | POA: Diagnosis not present

## 2023-08-13 ENCOUNTER — Other Ambulatory Visit: Payer: Self-pay | Admitting: Neurology

## 2023-08-13 DIAGNOSIS — R413 Other amnesia: Secondary | ICD-10-CM

## 2023-08-17 ENCOUNTER — Ambulatory Visit
Admission: RE | Admit: 2023-08-17 | Discharge: 2023-08-17 | Disposition: A | Discharge status: Home / Self Care | Source: Ambulatory Visit | Attending: Neurology | Admitting: Neurology

## 2023-08-17 DIAGNOSIS — R413 Other amnesia: Secondary | ICD-10-CM | POA: Diagnosis not present

## 2023-08-17 DIAGNOSIS — R9089 Other abnormal findings on diagnostic imaging of central nervous system: Secondary | ICD-10-CM | POA: Diagnosis not present

## 2023-08-17 DIAGNOSIS — I6782 Cerebral ischemia: Secondary | ICD-10-CM | POA: Diagnosis not present

## 2023-08-27 DIAGNOSIS — Z85828 Personal history of other malignant neoplasm of skin: Secondary | ICD-10-CM | POA: Diagnosis not present

## 2023-08-27 DIAGNOSIS — D225 Melanocytic nevi of trunk: Secondary | ICD-10-CM | POA: Diagnosis not present

## 2023-08-27 DIAGNOSIS — Z08 Encounter for follow-up examination after completed treatment for malignant neoplasm: Secondary | ICD-10-CM | POA: Diagnosis not present

## 2023-08-27 DIAGNOSIS — D2272 Melanocytic nevi of left lower limb, including hip: Secondary | ICD-10-CM | POA: Diagnosis not present

## 2023-08-27 DIAGNOSIS — D2262 Melanocytic nevi of left upper limb, including shoulder: Secondary | ICD-10-CM | POA: Diagnosis not present

## 2023-08-27 DIAGNOSIS — L570 Actinic keratosis: Secondary | ICD-10-CM | POA: Diagnosis not present

## 2023-08-27 DIAGNOSIS — D2271 Melanocytic nevi of right lower limb, including hip: Secondary | ICD-10-CM | POA: Diagnosis not present

## 2023-08-27 DIAGNOSIS — L821 Other seborrheic keratosis: Secondary | ICD-10-CM | POA: Diagnosis not present

## 2023-08-27 DIAGNOSIS — D2261 Melanocytic nevi of right upper limb, including shoulder: Secondary | ICD-10-CM | POA: Diagnosis not present

## 2023-09-07 DIAGNOSIS — H401111 Primary open-angle glaucoma, right eye, mild stage: Secondary | ICD-10-CM | POA: Diagnosis not present

## 2023-09-07 DIAGNOSIS — H353131 Nonexudative age-related macular degeneration, bilateral, early dry stage: Secondary | ICD-10-CM | POA: Diagnosis not present

## 2023-09-07 DIAGNOSIS — H40002 Preglaucoma, unspecified, left eye: Secondary | ICD-10-CM | POA: Diagnosis not present

## 2023-09-07 DIAGNOSIS — H04123 Dry eye syndrome of bilateral lacrimal glands: Secondary | ICD-10-CM | POA: Diagnosis not present

## 2023-09-12 ENCOUNTER — Other Ambulatory Visit: Payer: Self-pay | Admitting: *Deleted

## 2023-09-12 DIAGNOSIS — D509 Iron deficiency anemia, unspecified: Secondary | ICD-10-CM

## 2023-09-13 ENCOUNTER — Inpatient Hospital Stay: Attending: Oncology

## 2023-09-13 DIAGNOSIS — D509 Iron deficiency anemia, unspecified: Secondary | ICD-10-CM | POA: Insufficient documentation

## 2023-09-13 DIAGNOSIS — Z87891 Personal history of nicotine dependence: Secondary | ICD-10-CM | POA: Diagnosis not present

## 2023-09-13 DIAGNOSIS — E538 Deficiency of other specified B group vitamins: Secondary | ICD-10-CM | POA: Diagnosis not present

## 2023-09-13 LAB — CBC WITH DIFFERENTIAL/PLATELET
Abs Immature Granulocytes: 0.02 10*3/uL (ref 0.00–0.07)
Basophils Absolute: 0 10*3/uL (ref 0.0–0.1)
Basophils Relative: 0 %
Eosinophils Absolute: 0.1 10*3/uL (ref 0.0–0.5)
Eosinophils Relative: 1 %
HCT: 38.7 % — ABNORMAL LOW (ref 39.0–52.0)
Hemoglobin: 13 g/dL (ref 13.0–17.0)
Immature Granulocytes: 0 %
Lymphocytes Relative: 19 %
Lymphs Abs: 1.4 10*3/uL (ref 0.7–4.0)
MCH: 33.3 pg (ref 26.0–34.0)
MCHC: 33.6 g/dL (ref 30.0–36.0)
MCV: 99.2 fL (ref 80.0–100.0)
Monocytes Absolute: 0.8 10*3/uL (ref 0.1–1.0)
Monocytes Relative: 11 %
Neutro Abs: 5.3 10*3/uL (ref 1.7–7.7)
Neutrophils Relative %: 69 %
Platelets: 158 10*3/uL (ref 150–400)
RBC: 3.9 MIL/uL — ABNORMAL LOW (ref 4.22–5.81)
RDW: 13.3 % (ref 11.5–15.5)
WBC: 7.7 10*3/uL (ref 4.0–10.5)
nRBC: 0 % (ref 0.0–0.2)

## 2023-09-13 LAB — IRON AND TIBC
Iron: 80 ug/dL (ref 45–182)
Saturation Ratios: 24 % (ref 17.9–39.5)
TIBC: 329 ug/dL (ref 250–450)
UIBC: 249 ug/dL

## 2023-09-13 LAB — FERRITIN: Ferritin: 305 ng/mL (ref 24–336)

## 2023-09-17 ENCOUNTER — Ambulatory Visit: Admitting: Oncology

## 2023-09-17 ENCOUNTER — Ambulatory Visit

## 2023-09-18 ENCOUNTER — Inpatient Hospital Stay

## 2023-09-18 ENCOUNTER — Inpatient Hospital Stay (HOSPITAL_BASED_OUTPATIENT_CLINIC_OR_DEPARTMENT_OTHER): Admitting: Oncology

## 2023-09-18 ENCOUNTER — Encounter: Payer: Self-pay | Admitting: Oncology

## 2023-09-18 VITALS — BP 125/77 | HR 88 | Temp 96.7°F | Resp 16 | Ht 66.0 in | Wt 193.7 lb

## 2023-09-18 DIAGNOSIS — D509 Iron deficiency anemia, unspecified: Secondary | ICD-10-CM | POA: Diagnosis not present

## 2023-09-18 NOTE — Progress Notes (Signed)
 [facility]  Telephone:(336) [phone] Fax:(336) [phone]  ID: JAMONTA GOERNER OB: [birth year]  MR#: [id number]  RDW#:[id number]  Patient Care Team: [name], MD as PCP - General (Internal Medicine) Jacobo Evalene PARAS, MD as Consulting Physician (Hematology and Oncology)  CHIEF COMPLAINT: Iron  deficiency anemia.  INTERVAL HISTORY: Patient returns to clinic today for repeat laboratory, further evaluation, and consideration of additional IV Feraheme .  He has chronic fatigue, but otherwise feels well. He has no neurologic complaints.  He denies any recent fevers or illnesses.  He has a good appetite and denies weight loss.  He denies any chest pain, shortness of breath, cough, or hemoptysis.  He denies any nausea, vomiting, constipation, or diarrhea.  He has no melena or hematochezia.  He has no urinary complaints.  Patient offers no further specific complaints today.  REVIEW OF SYSTEMS:   Review of Systems  Constitutional:  Positive for malaise/fatigue. Negative for fever and weight loss.  Respiratory: Negative.  Negative for cough, hemoptysis and shortness of breath.   Cardiovascular: Negative.  Negative for chest pain and leg swelling.  Gastrointestinal: Negative.  Negative for abdominal pain, blood in stool and melena.  Genitourinary: Negative.  Negative for hematuria.  Musculoskeletal: Negative.  Negative for back pain.  Skin: Negative.  Negative for rash.  Neurological: Negative.  Negative for dizziness, focal weakness, weakness and headaches.  Psychiatric/Behavioral: Negative.  The patient is not nervous/anxious.     As per HPI. Otherwise, a complete review of systems is negative.  PAST MEDICAL HISTORY: Past Medical History:  Diagnosis Date   Aortic atherosclerosis (HCC)    Atrial fibrillation (HCC)    a.) CHA2DS2VASc = 6 (age x2, HTN, DVT x2, vascular disease history);  b.) rate/rhythm maintained on oral diltiazem  + metoprolol  succinate; chronic  anticoagulation (apixaban ) discontinued due to GI bleeding   BPH with obstruction/lower urinary tract symptoms    Bronchitis 01/27/2022   CAD (coronary artery disease)    Cardiomegaly    Chronic diarrhea    Compression fracture of thoracic vertebra (HCC)    a.) CT renal 12/30/2021: chronic appearing T11-T12 (70% height loss)   DDD (degenerative disc disease), cervical    Diverticulosis    DVT (deep venous thrombosis) (HCC) 03/31/2019   a.) anticoagulation intiated (apixaban ), however subsequently discontinued due to GI bleeding   ETOH abuse    GI bleed    Heart murmur    Hiatal hernia    Hyperlipidemia    Hypertension    IDA (iron  deficiency anemia)    Low testosterone    Nephrolithiasis    Pneumonia    Rotator cuff arthropathy, left    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    Vasovagal syncope     PAST SURGICAL HISTORY: Past Surgical History:  Procedure Laterality Date   boils removed from right forearm  1968   CARDIAC CATHETERIZATION     CATARACT EXTRACTION Left 2008   catracts Bilateral    left 2008, right 2013   CHOLECYSTECTOMY     COLONOSCOPY WITH PROPOFOL  N/A 06/26/2016   Procedure: COLONOSCOPY WITH PROPOFOL ;  Surgeon: Lamar ONEIDA Holmes, MD;  Location: [facility] ENDOSCOPY;  Service: Endoscopy;  Laterality: N/A;   COLONOSCOPY WITH PROPOFOL  N/A 07/03/2019   Procedure: COLONOSCOPY WITH PROPOFOL ;  Surgeon: Therisa Bi, MD;  Location: [facility] ENDOSCOPY;  Service: Gastroenterology;  Laterality: N/A;   ENDOSCOPIC RETROGRADE CHOLANGIOPANCREATOGRAPHY (ERCP) WITH PROPOFOL      ENDOSCOPIC VEIN LASER TREATMENT     right leg 05/2015, left leg  09/2015   ESOPHAGOGASTRODUODENOSCOPY N/A 04/19/2019   Procedure: ESOPHAGOGASTRODUODENOSCOPY (EGD);  Surgeon: Therisa Bi, MD;  Location: [facility] ENDOSCOPY;  Service: Gastroenterology;  Laterality: N/A;   ESOPHAGOGASTRODUODENOSCOPY (EGD) WITH PROPOFOL  N/A 07/03/2019   Procedure: ESOPHAGOGASTRODUODENOSCOPY (EGD) WITH PROPOFOL ;  Surgeon: Therisa Bi, MD;   Location: [facility] ENDOSCOPY;  Service: Gastroenterology;  Laterality: N/A;   ESOPHAGOGASTRODUODENOSCOPY (EGD) WITH PROPOFOL  N/A 08/07/2019   Procedure: ESOPHAGOGASTRODUODENOSCOPY (EGD) WITH PROPOFOL ;  Surgeon: Teressa Toribio SQUIBB, MD;  Location: [facility] ENDOSCOPY;  Service: Endoscopy;  Laterality: N/A;   EUS N/A 08/07/2019   Procedure: UPPER ENDOSCOPIC ULTRASOUND (EUS) RADIAL;  Surgeon: Teressa Toribio SQUIBB, MD;  Location: [facility] ENDOSCOPY;  Service: Endoscopy;  Laterality: N/A;   eyelid surgery     FOOT SURGERY Right 2009   HEMORROIDECTOMY     HERNIA REPAIR     mrercp  2014   NOSE SURGERY  2011   REVERSE SHOULDER ARTHROPLASTY Left 02/09/2022   Procedure: REVERSE SHOULDER ARTHROPLASTY AND BICEPS TENODESIS.;  Surgeon: Edie Norleen PARAS, MD;  Location: [facility] ORS;  Service: Orthopedics;  Laterality: Left;   RHINOPLASTY     squamous cell carcinoma removed from right hand  2013   TONSILLECTOMY     WISDOM TOOTH EXTRACTION      FAMILY HISTORY: Family History  Problem Relation Age of Onset   Hypothyroidism Mother    Diabetes Mother    Lung cancer Mother     ADVANCED DIRECTIVES (Y/N):  N  HEALTH MAINTENANCE: Social History   Tobacco Use   Smoking status: Former    Current packs/day: 0.00    Average packs/day: 1 pack/day for 25.0 years (25.0 ttl pk-yrs)    Types: Cigarettes    Start date: 12/04/1961    Quit date: 12/05/1986    Years since quitting: 36.8   Smokeless tobacco: Never  Vaping Use   Vaping status: Never Used  Substance Use Topics   Alcohol use: Yes    Alcohol/week: 14.0 standard drinks of alcohol    Types: 14 Cans of beer per week    Comment: 2 beers per dayOR  a couple glasses of wine   Drug use: No     Colonoscopy:  PAP:  Bone density:  Lipid panel:  Allergies  Allergen Reactions   Nsaids     Can take Ibuprofen in moderation/high doses or prolonged use causes GI bleed    Current Outpatient Medications  Medication Sig Dispense Refill   acetaminophen  (TYLENOL ) 650 MG CR tablet  Take 650 mg by mouth every 8 (eight) hours as needed for pain.     albuterol  (PROVENTIL ) (2.5 MG/3ML) 0.083% nebulizer solution One application inhale every 24 hours as needed.     amoxicillin (AMOXIL) 500 MG capsule Take 500 mg by mouth every 8 (eight) hours.     cetirizine (ZYRTEC) 10 MG tablet Take 10 mg by mouth every morning.     clindamycin (CLEOCIN T) 1 % external solution Apply 1 % topically as needed. (Apply to head)     desvenlafaxine (PRISTIQ) 100 MG 24 hr tablet Take 50 mg by mouth daily.     diltiazem  (CARDIZEM  CD) 120 MG 24 hr capsule Take 120 mg by mouth daily.     diltiazem  (CARDIZEM ) 30 MG tablet TAKE 1 TABLET BY MOUTH EVERY 12 HOURS 60 tablet 0   feeding supplement (ENSURE ENLIVE / ENSURE PLUS) LIQD Take 237 mLs by mouth 2 (two) times daily between meals. 14220 mL 0   fluticasone  (FLONASE ) 50 MCG/ACT nasal spray Use 1 spray(s) in  each nostril once daily 16 g 0   fluticasone  (FLONASE ) 50 MCG/ACT nasal spray Place 1 spray into both nostrils daily as needed for allergies or rhinitis.  2   folic acid  (FOLVITE ) 1 MG tablet Take 1 tablet (1 mg total) by mouth daily.     guaiFENesin  (MUCINEX ) 600 MG 12 hr tablet Take 600 mg by mouth 2 (two) times daily.     lidocaine  (LIDODERM ) 5 % Place 1 patch onto the skin daily. Remove & Discard patch within 12 hours or as directed by MD 30 patch 0   Multiple Vitamin (MULTIVITAMIN WITH MINERALS) TABS tablet Take 1 tablet by mouth daily.     naltrexone (DEPADE) 50 MG tablet Take 25 mg by mouth daily.     nystatin  cream (MYCOSTATIN ) Apply topically 2 (two) times daily. 30 g 0   pantoprazole  (PROTONIX ) 40 MG tablet Take 1 tablet (40 mg total) by mouth daily. 30 tablet 0   polyethylene glycol (MIRALAX  / GLYCOLAX ) 17 g packet Take 17 g by mouth daily.     sodium chloride  (OCEAN) 0.65 % nasal spray Place 1 spray into the nose every 6 (six) hours as needed for congestion.     tamsulosin  (FLOMAX ) 0.4 MG CAPS capsule Take 1 capsule by mouth once daily 30  capsule 0   timolol  (TIMOPTIC ) 0.5 % ophthalmic solution Place 1 drop into both eyes daily.      No current facility-administered medications for this visit.    OBJECTIVE: Vitals:   09/18/23 1322  BP: 125/77  Pulse: 88  Resp: 16  Temp: (!) 96.7 F (35.9 C)  SpO2: 94%      Body mass index is 31.26 kg/m.    ECOG FS:1 - Symptomatic but completely ambulatory  General: Well-developed, well-nourished, no acute distress. Eyes: Pink conjunctiva, anicteric sclera. HEENT: Normocephalic, moist mucous membranes. Lungs: No audible wheezing or coughing. Heart: Regular rate and rhythm. Abdomen: Soft, nontender, no obvious distention. Musculoskeletal: No edema, cyanosis, or clubbing. Neuro: Alert, answering all questions appropriately. Cranial nerves grossly intact. Skin: No rashes or petechiae noted. Psych: Normal affect.  LAB RESULTS:  Lab Results  Component Value Date   NA 137 04/05/2023   K 4.6 04/05/2023   CL 99 04/05/2023   CO2 32 (A) 04/05/2023   GLUCOSE 103 (H) 03/15/2023   BUN 22 (A) 04/05/2023   CREATININE 0.6 04/05/2023   CALCIUM 9.2 04/05/2023   PROT 7.0 03/10/2023   ALBUMIN 3.9 03/10/2023   AST 27 03/10/2023   ALT 30 03/10/2023   ALKPHOS 98 03/10/2023   BILITOT 1.0 03/10/2023   GFRNONAA >60 03/15/2023   GFRAA >60 04/21/2019    Lab Results  Component Value Date   WBC 7.7 09/13/2023   NEUTROABS 5.3 09/13/2023   HGB 13.0 09/13/2023   HCT 38.7 (L) 09/13/2023   MCV 99.2 09/13/2023   PLT 158 09/13/2023   Lab Results  Component Value Date   IRON  80 09/13/2023   TIBC 329 09/13/2023   IRONPCTSAT 24 09/13/2023   Lab Results  Component Value Date   FERRITIN 305 09/13/2023     STUDIES: No results found.  ASSESSMENT: Iron  deficiency anemia  Iron  deficiency anemia: Patient's hemoglobin and iron  stores are within normal limits.  He does not require additional IV Feraheme  today. Previously, patient had a normal colonoscopy on June 26, 2016.  His most recent  EGD on Aug 07, 2019 did not reveal any significant pathology.  Patient last received treatment on May 23, 2023.  No  further interventions needed.  Return to clinic in 4 months with repeat laboratory, further evaluation, and continuation of treatment if needed.   B12 deficiency: Resolved.  Patient last received treatment on Jul 26, 2021.  I spent a total of 20 minutes reviewing chart data, face-to-face evaluation with the patient, counseling and coordination of care as detailed above.   Patient expressed understanding and was in agreement with this plan. He also understands that He can call clinic at any time with any questions, concerns, or complaints.    Evalene JINNY Reusing, MD   09/18/2023 1:42 PM

## 2023-10-20 DIAGNOSIS — H66002 Acute suppurative otitis media without spontaneous rupture of ear drum, left ear: Secondary | ICD-10-CM | POA: Diagnosis not present

## 2023-10-20 DIAGNOSIS — H9192 Unspecified hearing loss, left ear: Secondary | ICD-10-CM | POA: Diagnosis not present

## 2023-10-20 DIAGNOSIS — H6121 Impacted cerumen, right ear: Secondary | ICD-10-CM | POA: Diagnosis not present

## 2023-11-06 DIAGNOSIS — Z79899 Other long term (current) drug therapy: Secondary | ICD-10-CM | POA: Diagnosis not present

## 2023-11-06 DIAGNOSIS — J3489 Other specified disorders of nose and nasal sinuses: Secondary | ICD-10-CM | POA: Diagnosis not present

## 2023-11-06 DIAGNOSIS — R7309 Other abnormal glucose: Secondary | ICD-10-CM | POA: Diagnosis not present

## 2023-11-06 DIAGNOSIS — M549 Dorsalgia, unspecified: Secondary | ICD-10-CM | POA: Diagnosis not present

## 2023-11-06 DIAGNOSIS — E782 Mixed hyperlipidemia: Secondary | ICD-10-CM | POA: Diagnosis not present

## 2023-11-06 DIAGNOSIS — Z6831 Body mass index (BMI) 31.0-31.9, adult: Secondary | ICD-10-CM | POA: Diagnosis not present

## 2023-11-06 DIAGNOSIS — M5430 Sciatica, unspecified side: Secondary | ICD-10-CM | POA: Diagnosis not present

## 2023-11-06 DIAGNOSIS — E66811 Obesity, class 1: Secondary | ICD-10-CM | POA: Diagnosis not present

## 2023-11-06 DIAGNOSIS — M5489 Other dorsalgia: Secondary | ICD-10-CM | POA: Diagnosis not present

## 2023-11-06 DIAGNOSIS — I4819 Other persistent atrial fibrillation: Secondary | ICD-10-CM | POA: Diagnosis not present

## 2023-11-06 DIAGNOSIS — E6609 Other obesity due to excess calories: Secondary | ICD-10-CM | POA: Diagnosis not present

## 2023-11-06 DIAGNOSIS — D509 Iron deficiency anemia, unspecified: Secondary | ICD-10-CM | POA: Diagnosis not present

## 2023-11-16 DIAGNOSIS — J3489 Other specified disorders of nose and nasal sinuses: Secondary | ICD-10-CM | POA: Diagnosis not present

## 2023-11-16 DIAGNOSIS — J342 Deviated nasal septum: Secondary | ICD-10-CM | POA: Diagnosis not present

## 2023-11-16 DIAGNOSIS — J310 Chronic rhinitis: Secondary | ICD-10-CM | POA: Diagnosis not present

## 2023-11-22 DIAGNOSIS — Z86718 Personal history of other venous thrombosis and embolism: Secondary | ICD-10-CM | POA: Diagnosis not present

## 2023-11-22 DIAGNOSIS — R030 Elevated blood-pressure reading, without diagnosis of hypertension: Secondary | ICD-10-CM | POA: Diagnosis not present

## 2023-11-22 DIAGNOSIS — I4819 Other persistent atrial fibrillation: Secondary | ICD-10-CM | POA: Diagnosis not present

## 2023-11-22 DIAGNOSIS — E7800 Pure hypercholesterolemia, unspecified: Secondary | ICD-10-CM | POA: Diagnosis not present

## 2023-11-22 DIAGNOSIS — Z23 Encounter for immunization: Secondary | ICD-10-CM | POA: Diagnosis not present

## 2023-11-22 DIAGNOSIS — R55 Syncope and collapse: Secondary | ICD-10-CM | POA: Diagnosis not present

## 2023-11-22 DIAGNOSIS — I340 Nonrheumatic mitral (valve) insufficiency: Secondary | ICD-10-CM | POA: Diagnosis not present

## 2023-11-26 DIAGNOSIS — M48062 Spinal stenosis, lumbar region with neurogenic claudication: Secondary | ICD-10-CM | POA: Diagnosis not present

## 2023-11-26 DIAGNOSIS — M47812 Spondylosis without myelopathy or radiculopathy, cervical region: Secondary | ICD-10-CM | POA: Diagnosis not present

## 2023-11-26 DIAGNOSIS — M5416 Radiculopathy, lumbar region: Secondary | ICD-10-CM | POA: Diagnosis not present

## 2023-11-26 DIAGNOSIS — M47816 Spondylosis without myelopathy or radiculopathy, lumbar region: Secondary | ICD-10-CM | POA: Diagnosis not present

## 2023-12-12 DIAGNOSIS — F1091 Alcohol use, unspecified, in remission: Secondary | ICD-10-CM | POA: Diagnosis not present

## 2023-12-12 DIAGNOSIS — R413 Other amnesia: Secondary | ICD-10-CM | POA: Diagnosis not present

## 2023-12-25 DIAGNOSIS — H40002 Preglaucoma, unspecified, left eye: Secondary | ICD-10-CM | POA: Diagnosis not present

## 2023-12-25 DIAGNOSIS — H401111 Primary open-angle glaucoma, right eye, mild stage: Secondary | ICD-10-CM | POA: Diagnosis not present

## 2024-01-01 DIAGNOSIS — H353131 Nonexudative age-related macular degeneration, bilateral, early dry stage: Secondary | ICD-10-CM | POA: Diagnosis not present

## 2024-01-01 DIAGNOSIS — H40002 Preglaucoma, unspecified, left eye: Secondary | ICD-10-CM | POA: Diagnosis not present

## 2024-01-01 DIAGNOSIS — Z961 Presence of intraocular lens: Secondary | ICD-10-CM | POA: Diagnosis not present

## 2024-01-01 DIAGNOSIS — H401111 Primary open-angle glaucoma, right eye, mild stage: Secondary | ICD-10-CM | POA: Diagnosis not present

## 2024-01-08 DIAGNOSIS — M9903 Segmental and somatic dysfunction of lumbar region: Secondary | ICD-10-CM | POA: Diagnosis not present

## 2024-01-08 DIAGNOSIS — M5432 Sciatica, left side: Secondary | ICD-10-CM | POA: Diagnosis not present

## 2024-01-08 DIAGNOSIS — M955 Acquired deformity of pelvis: Secondary | ICD-10-CM | POA: Diagnosis not present

## 2024-01-08 DIAGNOSIS — M9905 Segmental and somatic dysfunction of pelvic region: Secondary | ICD-10-CM | POA: Diagnosis not present

## 2024-01-14 DIAGNOSIS — R41841 Cognitive communication deficit: Secondary | ICD-10-CM | POA: Diagnosis not present

## 2024-01-15 DIAGNOSIS — Z03818 Encounter for observation for suspected exposure to other biological agents ruled out: Secondary | ICD-10-CM | POA: Diagnosis not present

## 2024-01-15 DIAGNOSIS — J069 Acute upper respiratory infection, unspecified: Secondary | ICD-10-CM | POA: Diagnosis not present

## 2024-01-15 DIAGNOSIS — J029 Acute pharyngitis, unspecified: Secondary | ICD-10-CM | POA: Diagnosis not present

## 2024-01-18 ENCOUNTER — Other Ambulatory Visit: Payer: Self-pay | Admitting: *Deleted

## 2024-01-18 DIAGNOSIS — D509 Iron deficiency anemia, unspecified: Secondary | ICD-10-CM

## 2024-01-21 ENCOUNTER — Inpatient Hospital Stay: Attending: Oncology

## 2024-01-21 DIAGNOSIS — D509 Iron deficiency anemia, unspecified: Secondary | ICD-10-CM

## 2024-01-21 DIAGNOSIS — E538 Deficiency of other specified B group vitamins: Secondary | ICD-10-CM | POA: Diagnosis not present

## 2024-01-21 DIAGNOSIS — Z87891 Personal history of nicotine dependence: Secondary | ICD-10-CM | POA: Diagnosis not present

## 2024-01-21 DIAGNOSIS — Z79899 Other long term (current) drug therapy: Secondary | ICD-10-CM | POA: Diagnosis not present

## 2024-01-21 LAB — CBC WITH DIFFERENTIAL/PLATELET
Abs Immature Granulocytes: 0.04 K/uL (ref 0.00–0.07)
Basophils Absolute: 0 K/uL (ref 0.0–0.1)
Basophils Relative: 1 %
Eosinophils Absolute: 0.2 K/uL (ref 0.0–0.5)
Eosinophils Relative: 4 %
HCT: 38.5 % — ABNORMAL LOW (ref 39.0–52.0)
Hemoglobin: 12.9 g/dL — ABNORMAL LOW (ref 13.0–17.0)
Immature Granulocytes: 1 %
Lymphocytes Relative: 24 %
Lymphs Abs: 1.6 K/uL (ref 0.7–4.0)
MCH: 32.9 pg (ref 26.0–34.0)
MCHC: 33.5 g/dL (ref 30.0–36.0)
MCV: 98.2 fL (ref 80.0–100.0)
Monocytes Absolute: 0.6 K/uL (ref 0.1–1.0)
Monocytes Relative: 9 %
Neutro Abs: 4.1 K/uL (ref 1.7–7.7)
Neutrophils Relative %: 61 %
Platelets: 194 K/uL (ref 150–400)
RBC: 3.92 MIL/uL — ABNORMAL LOW (ref 4.22–5.81)
RDW: 13.2 % (ref 11.5–15.5)
WBC: 6.5 K/uL (ref 4.0–10.5)
nRBC: 0 % (ref 0.0–0.2)

## 2024-01-21 LAB — IRON AND TIBC
Iron: 74 ug/dL (ref 45–182)
Saturation Ratios: 22 % (ref 17.9–39.5)
TIBC: 342 ug/dL (ref 250–450)
UIBC: 268 ug/dL

## 2024-01-21 LAB — FERRITIN: Ferritin: 318 ng/mL (ref 24–336)

## 2024-01-22 ENCOUNTER — Encounter: Payer: Self-pay | Admitting: Oncology

## 2024-01-22 ENCOUNTER — Inpatient Hospital Stay

## 2024-01-22 ENCOUNTER — Inpatient Hospital Stay (HOSPITAL_BASED_OUTPATIENT_CLINIC_OR_DEPARTMENT_OTHER): Admitting: Oncology

## 2024-01-22 VITALS — BP 115/84 | HR 93 | Temp 97.8°F | Resp 16 | Ht 66.0 in | Wt 200.0 lb

## 2024-01-22 DIAGNOSIS — Z87891 Personal history of nicotine dependence: Secondary | ICD-10-CM

## 2024-01-22 DIAGNOSIS — E538 Deficiency of other specified B group vitamins: Secondary | ICD-10-CM | POA: Diagnosis not present

## 2024-01-22 DIAGNOSIS — Z79899 Other long term (current) drug therapy: Secondary | ICD-10-CM | POA: Diagnosis not present

## 2024-01-22 DIAGNOSIS — D509 Iron deficiency anemia, unspecified: Secondary | ICD-10-CM

## 2024-01-22 NOTE — Progress Notes (Unsigned)
 Patient would like to discuss his recent labs.

## 2024-01-22 NOTE — Progress Notes (Unsigned)
 [facility]  Telephone:(336) [phone] Fax:(336) [phone]  ID: IVORY BAIL OB: Dec 25, 1940  MR#: [id number]  RDW#:[id number]  Patient Care Team: [name], MD as PCP - General (Internal Medicine) Jacobo Evalene PARAS, MD as Consulting Physician (Hematology and Oncology)  CHIEF COMPLAINT: Iron  deficiency anemia.  INTERVAL HISTORY: Patient returns to clinic today for repeat laboratory, further evaluation, and consideration of additional IV Feraheme .  She continues to have chronic fatigue, but otherwise feels well. He has no neurologic complaints.  He denies any recent fevers or illnesses.  He has a good appetite and denies weight loss.  He denies any chest pain, shortness of breath, cough, or hemoptysis.  He denies any nausea, vomiting, constipation, or diarrhea.  He has no melena or hematochezia.  He has no urinary complaints.  Patient offers no further specific complaints today.  REVIEW OF SYSTEMS:   Review of Systems  Constitutional:  Positive for malaise/fatigue. Negative for fever and weight loss.  Respiratory: Negative.  Negative for cough, hemoptysis and shortness of breath.   Cardiovascular: Negative.  Negative for chest pain and leg swelling.  Gastrointestinal: Negative.  Negative for abdominal pain, blood in stool and melena.  Genitourinary: Negative.  Negative for hematuria.  Musculoskeletal: Negative.  Negative for back pain.  Skin: Negative.  Negative for rash.  Neurological: Negative.  Negative for dizziness, focal weakness, weakness and headaches.  Psychiatric/Behavioral: Negative.  The patient is not nervous/anxious.     As per HPI. Otherwise, a complete review of systems is negative.  PAST MEDICAL HISTORY: Past Medical History:  Diagnosis Date   Aortic atherosclerosis    Atrial fibrillation (HCC)    a.) CHA2DS2VASc = 6 (age x2, HTN, DVT x2, vascular disease history);  b.) rate/rhythm maintained on oral diltiazem  + metoprolol  succinate; chronic  anticoagulation (apixaban ) discontinued due to GI bleeding   BPH with obstruction/lower urinary tract symptoms    Bronchitis 01/27/2022   CAD (coronary artery disease)    Cardiomegaly    Chronic diarrhea    Compression fracture of thoracic vertebra (HCC)    a.) CT renal 12/30/2021: chronic appearing T11-T12 (70% height loss)   DDD (degenerative disc disease), cervical    Diverticulosis    DVT (deep venous thrombosis) (HCC) 03/31/2019   a.) anticoagulation intiated (apixaban ), however subsequently discontinued due to GI bleeding   ETOH abuse    GI bleed    Heart murmur    Hiatal hernia    Hyperlipidemia    Hypertension    IDA (iron  deficiency anemia)    Low testosterone    Nephrolithiasis    Pneumonia    Rotator cuff arthropathy, left    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    Vasovagal syncope     PAST SURGICAL HISTORY: Past Surgical History:  Procedure Laterality Date   boils removed from right forearm  1968   CARDIAC CATHETERIZATION     CATARACT EXTRACTION Left 2008   catracts Bilateral    left 2008, right 2013   CHOLECYSTECTOMY     COLONOSCOPY WITH PROPOFOL  N/A 06/26/2016   Procedure: COLONOSCOPY WITH PROPOFOL ;  Surgeon: Lamar ONEIDA Holmes, MD;  Location: [facility] ENDOSCOPY;  Service: Endoscopy;  Laterality: N/A;   COLONOSCOPY WITH PROPOFOL  N/A 07/03/2019   Procedure: COLONOSCOPY WITH PROPOFOL ;  Surgeon: Therisa Bi, MD;  Location: [facility] ENDOSCOPY;  Service: Gastroenterology;  Laterality: N/A;   ENDOSCOPIC RETROGRADE CHOLANGIOPANCREATOGRAPHY (ERCP) WITH PROPOFOL      ENDOSCOPIC VEIN LASER TREATMENT     right leg 05/2015, left  leg 09/2015   ESOPHAGOGASTRODUODENOSCOPY N/A 04/19/2019   Procedure: ESOPHAGOGASTRODUODENOSCOPY (EGD);  Surgeon: Therisa Bi, MD;  Location: [facility] ENDOSCOPY;  Service: Gastroenterology;  Laterality: N/A;   ESOPHAGOGASTRODUODENOSCOPY (EGD) WITH PROPOFOL  N/A 07/03/2019   Procedure: ESOPHAGOGASTRODUODENOSCOPY (EGD) WITH PROPOFOL ;  Surgeon: Therisa Bi, MD;   Location: [facility] ENDOSCOPY;  Service: Gastroenterology;  Laterality: N/A;   ESOPHAGOGASTRODUODENOSCOPY (EGD) WITH PROPOFOL  N/A 08/07/2019   Procedure: ESOPHAGOGASTRODUODENOSCOPY (EGD) WITH PROPOFOL ;  Surgeon: Teressa Toribio SQUIBB, MD;  Location: [facility] ENDOSCOPY;  Service: Endoscopy;  Laterality: N/A;   EUS N/A 08/07/2019   Procedure: UPPER ENDOSCOPIC ULTRASOUND (EUS) RADIAL;  Surgeon: Teressa Toribio SQUIBB, MD;  Location: [facility] ENDOSCOPY;  Service: Endoscopy;  Laterality: N/A;   eyelid surgery     FOOT SURGERY Right 2009   HEMORROIDECTOMY     HERNIA REPAIR     mrercp  2014   NOSE SURGERY  2011   REVERSE SHOULDER ARTHROPLASTY Left 02/09/2022   Procedure: REVERSE SHOULDER ARTHROPLASTY AND BICEPS TENODESIS.;  Surgeon: Edie Norleen PARAS, MD;  Location: [facility] ORS;  Service: Orthopedics;  Laterality: Left;   RHINOPLASTY     squamous cell carcinoma removed from right hand  2013   TONSILLECTOMY     WISDOM TOOTH EXTRACTION      FAMILY HISTORY: Family History  Problem Relation Age of Onset   Hypothyroidism Mother    Diabetes Mother    Lung cancer Mother     ADVANCED DIRECTIVES (Y/N):  N  HEALTH MAINTENANCE: Social History   Tobacco Use   Smoking status: Former    Current packs/day: 0.00    Average packs/day: 1 pack/day for 25.0 years (25.0 ttl pk-yrs)    Types: Cigarettes    Start date: 12/04/1961    Quit date: 12/05/1986    Years since quitting: 37.1   Smokeless tobacco: Never  Vaping Use   Vaping status: Never Used  Substance Use Topics   Alcohol use: Yes    Alcohol/week: 14.0 standard drinks of alcohol    Types: 14 Cans of beer per week    Comment: 2 beers per dayOR  a couple glasses of wine   Drug use: No     Colonoscopy:  PAP:  Bone density:  Lipid panel:  Allergies  Allergen Reactions   Nsaids     Can take Ibuprofen in moderation/high doses or prolonged use causes GI bleed    Current Outpatient Medications  Medication Sig Dispense Refill   acetaminophen  (TYLENOL ) 650 MG CR tablet  Take 650 mg by mouth every 8 (eight) hours as needed for pain.     albuterol  (PROVENTIL ) (2.5 MG/3ML) 0.083% nebulizer solution One application inhale every 24 hours as needed.     amoxicillin-clavulanate (AUGMENTIN) 875-125 MG tablet Take 1 tablet by mouth 2 (two) times daily.     benzonatate (TESSALON) 200 MG capsule Take 200 mg by mouth.     cetirizine (ZYRTEC) 10 MG tablet Take 10 mg by mouth every morning.     clindamycin (CLEOCIN T) 1 % external solution Apply 1 % topically as needed. (Apply to head)     desvenlafaxine (PRISTIQ) 100 MG 24 hr tablet Take 50 mg by mouth daily.     diltiazem  (CARDIZEM ) 30 MG tablet TAKE 1 TABLET BY MOUTH EVERY 12 HOURS 60 tablet 0   feeding supplement (ENSURE ENLIVE / ENSURE PLUS) LIQD Take 237 mLs by mouth 2 (two) times daily between meals. 14220 mL 0   fluticasone  (FLONASE ) 50 MCG/ACT nasal spray Place 1 spray into both nostrils daily  as needed for allergies or rhinitis.  2   folic acid  (FOLVITE ) 1 MG tablet Take 1 tablet (1 mg total) by mouth daily.     guaiFENesin  (MUCINEX ) 600 MG 12 hr tablet Take 600 mg by mouth 2 (two) times daily.     lidocaine  (LIDODERM ) 5 % Place 1 patch onto the skin daily. Remove & Discard patch within 12 hours or as directed by MD 30 patch 0   naltrexone (DEPADE) 50 MG tablet Take 25 mg by mouth daily.     nystatin  cream (MYCOSTATIN ) Apply topically 2 (two) times daily. 30 g 0   pantoprazole  (PROTONIX ) 40 MG tablet Take 1 tablet (40 mg total) by mouth daily. 30 tablet 0   polyethylene glycol (MIRALAX  / GLYCOLAX ) 17 g packet Take 17 g by mouth daily.     sodium chloride  (OCEAN) 0.65 % nasal spray Place 1 spray into the nose every 6 (six) hours as needed for congestion.     tamsulosin  (FLOMAX ) 0.4 MG CAPS capsule Take 1 capsule by mouth once daily 30 capsule 0   timolol  (TIMOPTIC ) 0.5 % ophthalmic solution Place 1 drop into both eyes daily.      amoxicillin (AMOXIL) 500 MG capsule Take 500 mg by mouth every 8 (eight) hours. (Patient  not taking: Reported on 01/22/2024)     fluticasone  (FLONASE ) 50 MCG/ACT nasal spray Use 1 spray(s) in each nostril once daily (Patient not taking: Reported on 01/22/2024) 16 g 0   Multiple Vitamin (MULTIVITAMIN WITH MINERALS) TABS tablet Take 1 tablet by mouth daily. (Patient not taking: Reported on 01/22/2024)     No current facility-administered medications for this visit.    OBJECTIVE: Vitals:   01/22/24 1329  BP: 115/84  Pulse: 93  Resp: 16  Temp: 97.8 F (36.6 C)  SpO2: 95%       Body mass index is 32.28 kg/m.    ECOG FS:1 - Symptomatic but completely ambulatory  General: Well-developed, well-nourished, no acute distress. Eyes: Pink conjunctiva, anicteric sclera. HEENT: Normocephalic, moist mucous membranes. Lungs: No audible wheezing or coughing. Heart: Regular rate and rhythm. Abdomen: Soft, nontender, no obvious distention. Musculoskeletal: No edema, cyanosis, or clubbing. Neuro: Alert, answering all questions appropriately. Cranial nerves grossly intact. Skin: No rashes or petechiae noted. Psych: Normal affect.  LAB RESULTS:  Lab Results  Component Value Date   NA 137 04/05/2023   K 4.6 04/05/2023   CL 99 04/05/2023   CO2 32 (A) 04/05/2023   GLUCOSE 103 (H) 03/15/2023   BUN 22 (A) 04/05/2023   CREATININE 0.6 04/05/2023   CALCIUM 9.2 04/05/2023   PROT 7.0 03/10/2023   ALBUMIN 3.9 03/10/2023   AST 27 03/10/2023   ALT 30 03/10/2023   ALKPHOS 98 03/10/2023   BILITOT 1.0 03/10/2023   GFRNONAA >60 03/15/2023   GFRAA >60 04/21/2019    Lab Results  Component Value Date   WBC 6.5 01/21/2024   NEUTROABS 4.1 01/21/2024   HGB 12.9 (L) 01/21/2024   HCT 38.5 (L) 01/21/2024   MCV 98.2 01/21/2024   PLT 194 01/21/2024   Lab Results  Component Value Date   IRON  74 01/21/2024   TIBC 342 01/21/2024   IRONPCTSAT 22 01/21/2024   Lab Results  Component Value Date   FERRITIN 318 01/21/2024     STUDIES: No results found.  ASSESSMENT: Iron  deficiency  anemia  Iron  deficiency anemia: Essentially resolved.  Patient's hemoglobin is 12.9 and his iron  stores to be within normal limits.  He does not  require additional IV Feraheme  today. Previously, patient had a normal colonoscopy on June 26, 2016.  His most recent EGD on Aug 07, 2019 did not reveal any significant pathology.  Patient last received treatment on May 23, 2023.  No further intervention is needed.  Return to clinic in 4 months with repeat laboratory, further evaluation, and continuation of treatment if needed.   B12 deficiency: Resolved.  Patient last received treatment on Jul 26, 2021.   I spent a total of 20 minutes reviewing chart data, face-to-face evaluation with the patient, counseling and coordination of care as detailed above.   Patient expressed understanding and was in agreement with this plan. He also understands that He can call clinic at any time with any questions, concerns, or complaints.    Evalene JINNY Reusing, MD   01/24/2024 8:37 AM

## 2024-01-24 ENCOUNTER — Encounter: Payer: Self-pay | Admitting: Oncology

## 2024-01-24 DIAGNOSIS — M5416 Radiculopathy, lumbar region: Secondary | ICD-10-CM | POA: Diagnosis not present

## 2024-01-24 DIAGNOSIS — M48062 Spinal stenosis, lumbar region with neurogenic claudication: Secondary | ICD-10-CM | POA: Diagnosis not present

## 2024-01-29 ENCOUNTER — Other Ambulatory Visit: Payer: Self-pay

## 2024-01-29 ENCOUNTER — Emergency Department: Admission: EM | Admit: 2024-01-29 | Discharge: 2024-01-29 | Disposition: A | Discharge status: Home / Self Care

## 2024-01-29 DIAGNOSIS — F10129 Alcohol abuse with intoxication, unspecified: Secondary | ICD-10-CM | POA: Insufficient documentation

## 2024-01-29 DIAGNOSIS — Y907 Blood alcohol level of 200-239 mg/100 ml: Secondary | ICD-10-CM | POA: Diagnosis not present

## 2024-01-29 DIAGNOSIS — F10920 Alcohol use, unspecified with intoxication, uncomplicated: Secondary | ICD-10-CM

## 2024-01-29 LAB — ETHANOL: Alcohol, Ethyl (B): 209 mg/dL — ABNORMAL HIGH (ref <15)

## 2024-01-29 LAB — URINE DRUG SCREEN
Amphetamines: NEGATIVE
Barbiturates: NEGATIVE
Benzodiazepines: NEGATIVE
Cocaine: NEGATIVE
Fentanyl: NEGATIVE
Methadone Scn, Ur: NEGATIVE
Opiates: NEGATIVE
Tetrahydrocannabinol: NEGATIVE

## 2024-01-29 LAB — CBC
HCT: 37.6 % — ABNORMAL LOW (ref 39.0–52.0)
Hemoglobin: 12.4 g/dL — ABNORMAL LOW (ref 13.0–17.0)
MCH: 32.7 pg (ref 26.0–34.0)
MCHC: 33 g/dL (ref 30.0–36.0)
MCV: 99.2 fL (ref 80.0–100.0)
Platelets: 181 K/uL (ref 150–400)
RBC: 3.79 MIL/uL — ABNORMAL LOW (ref 4.22–5.81)
RDW: 13.1 % (ref 11.5–15.5)
WBC: 7.4 K/uL (ref 4.0–10.5)
nRBC: 0 % (ref 0.0–0.2)

## 2024-01-29 LAB — COMPREHENSIVE METABOLIC PANEL WITH GFR
ALT: 26 U/L (ref 0–44)
AST: 40 U/L (ref 15–41)
Albumin: 3.9 g/dL (ref 3.5–5.0)
Alkaline Phosphatase: 102 U/L (ref 38–126)
Anion gap: 11 (ref 5–15)
BUN: 16 mg/dL (ref 8–23)
CO2: 25 mmol/L (ref 22–32)
Calcium: 9.4 mg/dL (ref 8.9–10.3)
Chloride: 102 mmol/L (ref 98–111)
Creatinine, Ser: 0.76 mg/dL (ref 0.61–1.24)
GFR, Estimated: >60 mL/min (ref >60)
Glucose, Bld: 104 mg/dL — ABNORMAL HIGH (ref 70–99)
Potassium: 4.9 mmol/L (ref 3.5–5.1)
Sodium: 138 mmol/L (ref 135–145)
Total Bilirubin: 0.3 mg/dL (ref 0.0–1.2)
Total Protein: 6.7 g/dL (ref 6.5–8.1)

## 2024-01-29 NOTE — ED Triage Notes (Signed)
 Pt in via [facility] for intoxication. Pt states he drank wince. EMS gave this RN x2 500ml bottles of [facility] that contains 10.5% alcohol. Per pt he has had 3 of the merlot bottles. Pt is A&O x3.

## 2024-01-29 NOTE — Discharge Instructions (Signed)
 You were seen today due to concern of confusion, this is likely from the alcohol.  If you are interested in rehab or therapy, we have provided you with resources for detox.  Please return to the emergency department should you have any symptoms that you find concerning or need for further evaluation.

## 2024-01-29 NOTE — ED Provider Notes (Signed)
 [facility] Provider Note    Event Date/Time   First MD Initiated Contact with Patient 01/29/24 1656     (approximate)   History   Alcohol Intoxication   HPI  Marcus Coleman is a 83 y.o. male who presents with concern of alcohol intoxication.  Apparently has a history of alcohol abuse in the past, when patient initially presented, he is not able to provide me with much history stating that he feels fine he is not sure why anyone called EMS.  Eventually wife arrives who is able to provide further independent history.  She tells me that she came home and the patient appeared altered he was minimally responsive Closing his eyes during conversation.  She contacted EMS upon EMS arrival they asked if he smelled like alcohol, he had a glass of red liquid at his feet, and later on wife found multiple empty bottles of Merlot.  Apparently patient has a prior history of alcohol abuse, he had recently gotten out of alcohol rehab, and wife felt that he had likely been clean since time of discharge.  The patient does endorse to having drank what he tells me is 1 bottle of wine.  He denies any other complaints and otherwise feels well at this time.      Physical Exam   Triage Vital Signs: ED Triage Vitals  Encounter Vitals Group     BP      Girls Systolic BP Percentile      Girls Diastolic BP Percentile      Boys Systolic BP Percentile      Boys Diastolic BP Percentile      Pulse      Resp      Temp      Temp src      SpO2      Weight      Height      Head Circumference      Peak Flow      Pain Score      Pain Loc      Pain Education      Exclude from Growth Chart     Most recent vital signs: Vitals:   01/29/24 1704 01/29/24 2044  BP: 111/80   Pulse:    Resp:    Temp:  97.6 F (36.4 C)  SpO2:       General: Awake, no distress.  CV:  Good peripheral perfusion.  Resp:  Normal effort.  Abd:  No distention.  Other:     ED Results / Procedures /  Treatments   Labs (all labs ordered are listed, but only abnormal results are displayed) Labs Reviewed  COMPREHENSIVE METABOLIC PANEL WITH GFR - Abnormal; Notable for the following components:      Result Value   Glucose, Bld 104 (*)    All other components within normal limits  CBC - Abnormal; Notable for the following components:   RBC 3.79 (*)    Hemoglobin 12.4 (*)    HCT 37.6 (*)    All other components within normal limits  ETHANOL - Abnormal; Notable for the following components:   Alcohol, Ethyl (B) 209 (*)    All other components within normal limits  URINE DRUG SCREEN     EKG     RADIOLOGY   PROCEDURES:  Critical Care performed: No  Procedures   MEDICATIONS ORDERED IN ED: Medications - No data to display   IMPRESSION / MDM / ASSESSMENT AND PLAN / ED  COURSE  I reviewed the triage vital signs and the nursing notes.                               Patient's presentation is most consistent with acute, uncomplicated illness.  83 year old male history of alcohol abuse presents today with concern of alcohol intoxication after being found with 2 bottles of Merlot.  He is in no acute distress he has no complaints at this time.  Wife is at bedside who provides majority of the history.  Lab work looks reassuring we are obtaining a ethanol level to ensure that this is what the cause of the patient's altered mental status is, however anticipate likely reasonable for discharge home, wife is comfortable taking the patient home.   Clinical Course as of 01/29/24 2328  Tue Jan 29, 2024  1952 Alcohol of 209 likely explain the patient's symptomatology.  Will have him discharged home at this time. [SK]    Clinical Course User Index [SK] Fernand Rossie HERO, MD     FINAL CLINICAL IMPRESSION(S) / ED DIAGNOSES   Final diagnoses:  Alcoholic intoxication without complication     Rx / DC Orders   ED Discharge Orders     None        Note:  This document was  prepared using Dragon voice recognition software and may include unintentional dictation errors.   Fernand Rossie HERO, MD 01/29/24 [phone]

## 2024-02-04 DIAGNOSIS — E782 Mixed hyperlipidemia: Secondary | ICD-10-CM | POA: Diagnosis not present

## 2024-02-04 DIAGNOSIS — I4819 Other persistent atrial fibrillation: Secondary | ICD-10-CM | POA: Diagnosis not present

## 2024-02-04 DIAGNOSIS — D509 Iron deficiency anemia, unspecified: Secondary | ICD-10-CM | POA: Diagnosis not present

## 2024-02-04 DIAGNOSIS — E6609 Other obesity due to excess calories: Secondary | ICD-10-CM | POA: Diagnosis not present

## 2024-02-04 DIAGNOSIS — E66811 Obesity, class 1: Secondary | ICD-10-CM | POA: Diagnosis not present

## 2024-02-04 DIAGNOSIS — Z6831 Body mass index (BMI) 31.0-31.9, adult: Secondary | ICD-10-CM | POA: Diagnosis not present

## 2024-02-04 DIAGNOSIS — F1010 Alcohol abuse, uncomplicated: Secondary | ICD-10-CM | POA: Diagnosis not present

## 2024-05-28 ENCOUNTER — Inpatient Hospital Stay

## 2024-05-29 ENCOUNTER — Inpatient Hospital Stay

## 2024-05-29 ENCOUNTER — Inpatient Hospital Stay: Admitting: Oncology
